# Patient Record
Sex: Female | Born: 1950 | Race: Black or African American | Hispanic: No | Marital: Married | Smoking: Former smoker
Health system: Southern US, Community
[De-identification: ages and names within clinical notes are randomized; demographics above are authoritative.]

## PROBLEM LIST (undated history)

## (undated) DIAGNOSIS — I447 Left bundle-branch block, unspecified: Secondary | ICD-10-CM

## (undated) DIAGNOSIS — I255 Ischemic cardiomyopathy: Secondary | ICD-10-CM

## (undated) DIAGNOSIS — F419 Anxiety disorder, unspecified: Secondary | ICD-10-CM

## (undated) DIAGNOSIS — I2510 Atherosclerotic heart disease of native coronary artery without angina pectoris: Secondary | ICD-10-CM

## (undated) DIAGNOSIS — E669 Obesity, unspecified: Secondary | ICD-10-CM

## (undated) DIAGNOSIS — R519 Headache, unspecified: Secondary | ICD-10-CM

## (undated) DIAGNOSIS — Z95810 Presence of automatic (implantable) cardiac defibrillator: Secondary | ICD-10-CM

## (undated) DIAGNOSIS — I4819 Other persistent atrial fibrillation: Secondary | ICD-10-CM

## (undated) DIAGNOSIS — I340 Nonrheumatic mitral (valve) insufficiency: Secondary | ICD-10-CM

## (undated) DIAGNOSIS — E7800 Pure hypercholesterolemia, unspecified: Secondary | ICD-10-CM

## (undated) DIAGNOSIS — I10 Essential (primary) hypertension: Secondary | ICD-10-CM

## (undated) DIAGNOSIS — Z9289 Personal history of other medical treatment: Secondary | ICD-10-CM

## (undated) DIAGNOSIS — R51 Headache: Secondary | ICD-10-CM

## (undated) DIAGNOSIS — I82409 Acute embolism and thrombosis of unspecified deep veins of unspecified lower extremity: Secondary | ICD-10-CM

## (undated) DIAGNOSIS — I639 Cerebral infarction, unspecified: Secondary | ICD-10-CM

## (undated) DIAGNOSIS — E119 Type 2 diabetes mellitus without complications: Secondary | ICD-10-CM

## (undated) DIAGNOSIS — R569 Unspecified convulsions: Secondary | ICD-10-CM

## (undated) DIAGNOSIS — I219 Acute myocardial infarction, unspecified: Secondary | ICD-10-CM

## (undated) DIAGNOSIS — I2699 Other pulmonary embolism without acute cor pulmonale: Secondary | ICD-10-CM

## (undated) HISTORY — DX: Anxiety disorder, unspecified: F41.9

## (undated) HISTORY — DX: Ischemic cardiomyopathy: I25.5

## (undated) HISTORY — PX: HERNIA REPAIR: SHX51

## (undated) HISTORY — DX: Atherosclerotic heart disease of native coronary artery without angina pectoris: I25.10

## (undated) HISTORY — PX: TUBAL LIGATION: SHX77

## (undated) HISTORY — DX: Nonrheumatic mitral (valve) insufficiency: I34.0

## (undated) HISTORY — DX: Cerebral infarction, unspecified: I63.9

## (undated) HISTORY — DX: Left bundle-branch block, unspecified: I44.7

## (undated) HISTORY — DX: Other persistent atrial fibrillation: I48.19

## (undated) HISTORY — PX: BILATERAL CARPAL TUNNEL RELEASE: SHX6508

## (undated) HISTORY — PX: CORONARY ANGIOPLASTY WITH STENT PLACEMENT: SHX49

## (undated) HISTORY — PX: DILATION AND CURETTAGE OF UTERUS: SHX78

## (undated) HISTORY — PX: CORONARY ARTERY BYPASS GRAFT: SHX141

## (undated) HISTORY — DX: Obesity, unspecified: E66.9

---

## 1988-01-05 HISTORY — PX: EXTERNAL EAR SURGERY: SHX627

## 1991-01-05 DIAGNOSIS — I2699 Other pulmonary embolism without acute cor pulmonale: Secondary | ICD-10-CM

## 1991-01-05 HISTORY — PX: VENA CAVA FILTER PLACEMENT: SUR1032

## 1991-01-05 HISTORY — DX: Other pulmonary embolism without acute cor pulmonale: I26.99

## 1994-01-04 DIAGNOSIS — I82409 Acute embolism and thrombosis of unspecified deep veins of unspecified lower extremity: Secondary | ICD-10-CM

## 1994-01-04 HISTORY — DX: Acute embolism and thrombosis of unspecified deep veins of unspecified lower extremity: I82.409

## 1999-01-05 DIAGNOSIS — I639 Cerebral infarction, unspecified: Secondary | ICD-10-CM

## 1999-01-05 HISTORY — DX: Cerebral infarction, unspecified: I63.9

## 2007-07-24 HISTORY — PX: CARDIAC DEFIBRILLATOR PLACEMENT: SHX171

## 2007-10-09 ENCOUNTER — Encounter: Payer: Self-pay | Admitting: Internal Medicine

## 2009-07-07 ENCOUNTER — Emergency Department (HOSPITAL_COMMUNITY)
Admission: EM | Admit: 2009-07-07 | Discharge: 2009-07-07 | Payer: Self-pay | Source: Home / Self Care | Admitting: Emergency Medicine

## 2009-07-07 ENCOUNTER — Ambulatory Visit: Payer: Self-pay | Admitting: Surgery

## 2009-07-07 ENCOUNTER — Encounter (INDEPENDENT_AMBULATORY_CARE_PROVIDER_SITE_OTHER): Payer: Self-pay | Admitting: Emergency Medicine

## 2009-09-18 ENCOUNTER — Encounter: Payer: Self-pay | Admitting: Internal Medicine

## 2009-09-30 ENCOUNTER — Encounter: Admission: RE | Admit: 2009-09-30 | Discharge: 2009-09-30 | Payer: Self-pay | Admitting: Internal Medicine

## 2009-10-17 ENCOUNTER — Emergency Department (HOSPITAL_COMMUNITY): Admission: EM | Admit: 2009-10-17 | Discharge: 2009-10-17 | Payer: Self-pay | Admitting: Emergency Medicine

## 2010-01-29 ENCOUNTER — Telehealth (INDEPENDENT_AMBULATORY_CARE_PROVIDER_SITE_OTHER): Payer: Self-pay | Admitting: *Deleted

## 2010-01-29 ENCOUNTER — Encounter: Payer: Self-pay | Admitting: Internal Medicine

## 2010-01-29 ENCOUNTER — Ambulatory Visit
Admission: RE | Admit: 2010-01-29 | Discharge: 2010-01-29 | Payer: Self-pay | Source: Home / Self Care | Attending: Internal Medicine | Admitting: Internal Medicine

## 2010-01-29 DIAGNOSIS — I4891 Unspecified atrial fibrillation: Secondary | ICD-10-CM | POA: Insufficient documentation

## 2010-01-29 DIAGNOSIS — I5022 Chronic systolic (congestive) heart failure: Secondary | ICD-10-CM | POA: Insufficient documentation

## 2010-01-29 DIAGNOSIS — I4729 Other ventricular tachycardia: Secondary | ICD-10-CM | POA: Insufficient documentation

## 2010-01-29 DIAGNOSIS — I110 Hypertensive heart disease with heart failure: Secondary | ICD-10-CM | POA: Insufficient documentation

## 2010-01-29 DIAGNOSIS — I472 Ventricular tachycardia: Secondary | ICD-10-CM | POA: Insufficient documentation

## 2010-01-29 DIAGNOSIS — I6350 Cerebral infarction due to unspecified occlusion or stenosis of unspecified cerebral artery: Secondary | ICD-10-CM | POA: Insufficient documentation

## 2010-01-29 DIAGNOSIS — I2699 Other pulmonary embolism without acute cor pulmonale: Secondary | ICD-10-CM | POA: Insufficient documentation

## 2010-01-29 DIAGNOSIS — Z95810 Presence of automatic (implantable) cardiac defibrillator: Secondary | ICD-10-CM | POA: Insufficient documentation

## 2010-02-05 NOTE — Progress Notes (Addendum)
  Pt signed ROI,faed to [facility] @ [phone]...call back @[phone] [facility]  January 29, 2010 2:56 PM     Appended Document:  Records Received from [facility] gave to [place]

## 2010-02-11 NOTE — Assessment & Plan Note (Signed)
Summary: nep/ consult due to pacemaker-mb   Visit Type:  np Primary Provider:  Carin Primrose  CC:  dizziness.  History of Present Illness: Shelley Douglas is seen at the request of Dr. Rikki Spearing to establish cardiovascular care.  She is a 60 year old woman with a complex cardiac history dates back to the year 2000 when she had her first heart attack in 2001 she had a stroke and has had called with heart failure since then. She underwent some kind of open heart surgery which sounds like a bypass; review of the chest x-ray demonstrates neither are aortic clips or a mechanical valve.  This was undertaken for shortness of breath which is continued to be a problem although it was improved by surgery. She has 6 pillow orthopnea occasional peripheral edema dyspnea on exertion but no PND. She has some exertional chest discomfort.  Records are not available from her [facility]. Records from [facility] where she presented in October demonstrates a AV paced rhythm.  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  From embolic risk factors are notable for gender-1, vascular disease-1,-diabetes1, hypertension-1, and prior stroke-2 4 CHADS VASC score of 7. She is on Coumadin.  Current Medications (verified): 1)  Digoxin 0.125 Mg Tabs (Digoxin) .... Take A Half  Tablet By Mouth Daily 2)  Coumadin 10 Mg Tabs (Warfarin Sodium) .... Use As Directed By [facility] 3)  Furosemide 20 Mg Tabs (Furosemide) .... Take One Tablet By Mouth Daily. 4)  Folic Acid 1 Mg Tabs (Folic Acid) .... Once Daily 5)  Cozaar 50 Mg Tabs (Losartan Potassium) .... Once Daily 6)  Lovastatin 20 Mg Tabs (Lovastatin) .... Take One Tablet By Mouth Daily At Bedtime  Allergies (verified): 1)  ! Plavix  Past History:  Family History: Last updated: 2010/02/05 Father: Deceased ? Mother: Living 56yrs Siblings: 5-sisters  living, 2-Deceasede, 2-brothers Deceased  Social History: Last updated: 02/05/10 Tobacco Use - No.  Alcohol Use - no Drug Use - no Disabled  Married   Risk Factors: Smoking Status: never (Feb 05, 2010)  Past Medical History: Atrial Fibrillation ? Coronary artery disease Diabetes Hyperthyroidism implantable defibrillator-history of discharge probably inappropriate secondary to rapid atrial fibrillation Pulmonary Embolism Stroke Congestive Heart Failure Dizziness SOB Deep Vein Thrombosis Anxiety  Past Surgical History: Cardiac Stent open heart surgery  specics to be obtained implantable defibrillator antiembolic filter into vena cava  Family History: Father: Deceased ? Mother: Living 52yrs Siblings: 5-sisters living, 2-Deceasede, 2-brothers Deceased  Social History: Tobacco Use - No.  Alcohol Use - no Drug Use - no Disabled  Married   Review of Systems       full review of systems was negative apart from a history of present illness and past medical history arthritis of her hands and feet, dizziness, glasses and blurry vision, poor dentition.   Vital Signs:  Patient profile:   60 year old female Height:      66.5 inches Weight:      229 pounds BMI:     36.54 Pulse rate:   77 / minute BP sitting:   138 / 76  (left arm) Cuff size:   large  Vitals Entered By: Caralee Ates CMA (02/05/2010 9:42 AM)  Physical Exam  General:  Well developed, well nourished, middle tolder aged [place] American female appearing her stated age in no acute distress. Head:  normal HEENT  apart from poor dentition Neck:  supple without thyromegaly or JVP centimeters carotids brisk and full without bruits Chest Wall:  withoutkyphosis Lungs:  clear to auscultation Heart:  regular rate and rhythm without murmurs or gallops Abdomen:  soft and nontender with active bowel sounds hepatomegaly and  Pulsation not palpable Msk:  without obvious musculoskeletal defects Pulses:   intact distal pulses Extremities:  without clubbing or cyanosis; 1+ peripheral edema Neurologic:  alert and oriented and grossly normal motor and sensory function Skin:  warm and dry Cervical Nodes:  without adenopathy Psych:  engaging affect   EKG  Procedure date:  01/29/2010  Findings:      sinus rhythm at 77 Intervals 0.16/0.10/0.36 Axis is 53 T Wave flattening rather diffusely   ICD Specifications Following MD:  Sherryl Manges, MD     ICD Vendor:  Medtronic     ICD Model Number:  D224DRG     ICD Serial Number:  [id number] ICD DOI:  [id number]     ICD Implanting MD:  NOT IMPLANTED BY [place]  Lead 1:    Location: RA     DOI: 07/24/2007     Model #: [id number]     Serial #: [id number]     Status: active Lead 2:    Location: RV     DOI: 07/24/2007     Model #: [id number]     Serial #: [id number]     Status: active  Indications::  VT   ICD Follow Up Remote Check?  No Battery Voltage:  3.11 V     Charge Time:  8.9 seconds     Underlying rhythm:  sr ICD Dependent:  No       ICD Device Measurements Atrium:  Amplitude: 4.3 mV, Impedance: 551 ohms, Threshold: 0.5 V at 0.4 msec Right Ventricle:  Amplitude: 6.3 mV, Impedance: 494 ohms, Threshold: 0.75 V at 0.4 msec Shock Impedance: 42/50 ohms   Episodes MS Episodes:  0     Percent Mode Switch:  0     Coumadin:  Yes Shock:  0     ATP:  0     Nonsustained:  4     Atrial Pacing:  4.4%     Ventricular Pacing:  0.1%  Brady Parameters Mode ddd+     Lower Rate Limit:  70     Upper Rate Limit 130 PAV 200     Sensed AV Delay:  200  Tachy Zones VF:  222     Next Cardiology Appt Due:  04/05/2010 Tech Comments:  No parameter changes.  Device function normal. Optivol and thoracic impedance abnormal in December but normal now.  ROV 3 months with Dr. Graciela Husbands at which time we will set her up for [facility]. Altha Harm, LPN  January 29, 2010 10:37 AM   Impression & Recommendations:  Problem # 1:  CONGESTIVE HEART FAILURE UNSPECIFIED (ICD-428.0)  the patient  has evidence of fluid overload with peripheral edema and JVD. We will plan to increase her Lasix from 20-40 daily we'll need to check a metabolic profile which we will do in 3 weeks' time I note that when she had her laboratories drawn at [facility] in October her creatinine was normal as was her potassium Her updated medication list for this problem includes:    Digoxin 0.125 Mg Tabs (Digoxin) .Marland Kitchen... Take a half  tablet by mouth daily    Coumadin 10 Mg Tabs (Warfarin sodium) ..... Use as directed by the coumadin clinic  Furosemide 40 Mg Tabs (Furosemide) ..... Once daily    Cozaar 50 Mg Tabs (Losartan potassium) ..... Once daily    Metoprolol Succinate 50 Mg Xr24h-tab (Metoprolol succinate) ..... Once daily  Orders: Echocardiogram (Echo)  Problem # 2:  IMPLANTATION OF DEFIBRILLATOR, HX OF (ICD-V45.02) Device parameters and data were reviewed and no changes were made-history of discharge likely inappropriate secondary to rapid atrial fibrillation which is identified for her cardiac Compass. Optivol is flat  Problem # 3:  ISCHEMIC CARDIOMYOPATHY S/P CABG (ICD-414.8)  she thinks that she had bypass surgery. Will need to get the records from [facility]. [place] also undertaken echo to assess left ventricular function as I presume it is down given the presence of her defibrillator notably she is not on a beta blocker we will start her on metoprolol succinate 50 mg a day Her updated medication list for this problem includes:    Digoxin 0.125 Mg Tabs (Digoxin) .Marland Kitchen... Take a half  tablet by mouth daily    Coumadin 10 Mg Tabs (Warfarin sodium) ..... Use as directed by the coumadin clinic    Furosemide 40 Mg Tabs (Furosemide) ..... Once daily    Cozaar 50 Mg Tabs (Losartan potassium) ..... Once daily    Metoprolol Succinate 50 Mg Xr24h-tab (Metoprolol succinate) ..... Once daily  Orders: Echocardiogram (Echo)  Problem # 4:  HYPERTENSION, HEART CONTROLLED W/ CHF (ICD-402.11) blood  pressure is modestly elevated. see above Her updated medication list for this problem includes:    Furosemide 40 Mg Tabs (Furosemide) ..... Once daily    Cozaar 50 Mg Tabs (Losartan potassium) ..... Once daily    Metoprolol Succinate 50 Mg Xr24h-tab (Metoprolol succinate) ..... Once daily  Problem # 5:  CVA (ICD-434.91) BC continued on Coumadin. Probably target INR to be 2.5-3.5. Her updated medication list for this problem includes:    Coumadin 10 Mg Tabs (Warfarin sodium) ..... Use as directed by the coumadin clinic  Problem # 6:  ATRIAL FIBRILLATION (ICD-427.31)  the patient has had no identified ventricular tachycardia; she has had however atrial fibrillation with rates up to 200. I should note that her ICD is programmed as a one zone device at 222 Her updated medication list for this problem includes:    Digoxin 0.125 Mg Tabs (Digoxin) .Marland Kitchen... Take a half  tablet by mouth daily    Coumadin 10 Mg Tabs (Warfarin sodium) ..... Use as directed by the coumadin clinic    Metoprolol Succinate 50 Mg Xr24h-tab (Metoprolol succinate) ..... Once daily  Her updated medication list for this problem includes:    Digoxin 0.125 Mg Tabs (Digoxin) .Marland Kitchen... Take a half  tablet by mouth daily    Coumadin 10 Mg Tabs (Warfarin sodium) ..... Use as directed by the coumadin clinic  Orders: Echocardiogram (Echo)  Her updated medication list for this problem includes:    Digoxin 0.125 Mg Tabs (Digoxin) .Marland Kitchen... Take a half  tablet by mouth daily    Coumadin 10 Mg Tabs (Warfarin sodium) ..... Use as directed by the coumadin clinic  Patient Instructions: 1)  Your physician recommends that you schedule a follow-up appointment in: 3 months 2)  Your physician recommends that you have lab work in 3 weeks.: BMP-428.0,414.8,427.1,427.31 3)  Your physician has requested that you have an echocardiogram in 3 week.  Echocardiography is a painless test that uses sound waves to create images of your heart. It provides your  doctor with information about the size and shape of your heart and how  well your heart's chambers and valves are working.  This procedure takes approximately one hour. There are no restrictions for this procedure. 4)  Your physician has recommended you make the following change in your medication: add Metoprolol and increase Furosemide.  Prescriptions: METOPROLOL SUCCINATE 50 MG XR24H-TAB (METOPROLOL SUCCINATE) once daily  #30 x 11   Authorized by:   Nathen May, MD, [facility]   Signed by:   <UNSIGNED>   Method used:   Electronically to        [facility] [address] (retail)       [phone]       [place], [place]  [address]       Ph: [phone]       Fax: [phone]   RxID:   [id number] FUROSEMIDE 40 MG TABS (FUROSEMIDE) once daily  #30 x 11   Authorized by:   Nathen May, MD, [facility]   Signed by:   <UNSIGNED>   Method used:   Electronically to        [facility] [address] (retail)       [phone]       [place], [place]  [address]       Ph: [phone]       Fax: [phone]   RxID:   [id number]

## 2010-02-11 NOTE — Cardiovascular Report (Signed)
Summary: Office Visit   Office Visit   Imported By: Roderic Ovens 02/04/2010 14:05:34  _____________________________________________________________________  External Attachment:    Type:   Image     Comment:   External Document

## 2010-02-16 ENCOUNTER — Encounter: Payer: Self-pay | Admitting: Internal Medicine

## 2010-02-16 ENCOUNTER — Other Ambulatory Visit (INDEPENDENT_AMBULATORY_CARE_PROVIDER_SITE_OTHER): Payer: Medicare Other

## 2010-02-16 ENCOUNTER — Other Ambulatory Visit: Payer: Self-pay | Admitting: Internal Medicine

## 2010-02-16 ENCOUNTER — Ambulatory Visit (HOSPITAL_COMMUNITY): Payer: Medicare Other | Attending: Internal Medicine

## 2010-02-16 DIAGNOSIS — I509 Heart failure, unspecified: Secondary | ICD-10-CM

## 2010-02-16 DIAGNOSIS — I10 Essential (primary) hypertension: Secondary | ICD-10-CM | POA: Insufficient documentation

## 2010-02-16 DIAGNOSIS — I059 Rheumatic mitral valve disease, unspecified: Secondary | ICD-10-CM | POA: Insufficient documentation

## 2010-02-16 DIAGNOSIS — I079 Rheumatic tricuspid valve disease, unspecified: Secondary | ICD-10-CM | POA: Insufficient documentation

## 2010-02-16 DIAGNOSIS — R42 Dizziness and giddiness: Secondary | ICD-10-CM | POA: Insufficient documentation

## 2010-02-16 DIAGNOSIS — I2589 Other forms of chronic ischemic heart disease: Secondary | ICD-10-CM

## 2010-02-16 DIAGNOSIS — I379 Nonrheumatic pulmonary valve disorder, unspecified: Secondary | ICD-10-CM | POA: Insufficient documentation

## 2010-02-16 LAB — BASIC METABOLIC PANEL
BUN: 15 mg/dL (ref 6–23)
CO2: 27 mEq/L (ref 19–32)
Calcium: 9.5 mg/dL (ref 8.4–10.5)
GFR: 84.34 mL/min (ref >60.00)
Glucose, Bld: 98 mg/dL (ref 70–99)
Sodium: 141 mEq/L (ref 135–145)

## 2010-02-25 ENCOUNTER — Encounter: Payer: Self-pay | Admitting: Internal Medicine

## 2010-03-03 ENCOUNTER — Encounter: Payer: Self-pay | Admitting: Internal Medicine

## 2010-03-03 ENCOUNTER — Other Ambulatory Visit (INDEPENDENT_AMBULATORY_CARE_PROVIDER_SITE_OTHER): Payer: Medicare Other

## 2010-03-03 ENCOUNTER — Other Ambulatory Visit: Payer: Self-pay | Admitting: Internal Medicine

## 2010-03-03 DIAGNOSIS — Z79899 Other long term (current) drug therapy: Secondary | ICD-10-CM

## 2010-03-03 DIAGNOSIS — I509 Heart failure, unspecified: Secondary | ICD-10-CM

## 2010-03-03 LAB — BASIC METABOLIC PANEL
BUN: 15 mg/dL (ref 6–23)
CO2: 25 mEq/L (ref 19–32)
Calcium: 9.5 mg/dL (ref 8.4–10.5)
Chloride: 106 mEq/L (ref 96–112)
Creatinine, Ser: 1 mg/dL (ref 0.4–1.2)

## 2010-03-03 NOTE — Assessment & Plan Note (Signed)
Summary: [facility]   Allergies: 1)  ! Plavix  Past History:  Past Medical History: Atrial Fibrillation Coronary artery disease/mitral valve regurgitation Diabetes Hyperthyroidism implantable defibrillator for spontaneous ventricular tachycardia-history of discharge probably inappropriate secondary to rapid atrial fibrillation hypercoagulable state manifested byPulmonary Embolism and Deep Vein Thrombosis Stroke 2001 Congestive Heart Failure Dizziness Anxiety obesity  Past Surgical History: Cardiac Stent status post CABG with mitral valve ring implantable defibrillator antiembolic filter into vena cava    ICD Specifications Following MD:  Sherryl Manges, MD     ICD Vendor:  Medtronic     ICD Model Number:  D224DRG     ICD Serial Number:  [id number] ICD DOI:  [id number]     ICD Implanting MD:  NOT IMPLANTED BY [place]  Lead 1:    Location: RA     DOI: 07/24/2007     Model #: [id number]     Serial #: [id number]     Status: active Lead 2:    Location: RV     DOI: 07/24/2007     Model #: [id number]     Serial #: [id number]     Status: active  Indications::  VT   ICD Follow Up ICD Dependent:  No      Episodes Coumadin:  Yes  Brady Parameters Mode ddd+     Lower Rate Limit:  70     Upper Rate Limit 130 PAV 200     Sensed AV Delay:  200  Tachy Zones VF:  222

## 2010-03-19 LAB — CBC
MCH: 29.4 pg (ref 26.0–34.0)
MCHC: 34.1 g/dL (ref 30.0–36.0)
Platelets: 207 10*3/uL (ref 150–400)
RDW: 15.6 % — ABNORMAL HIGH (ref 11.5–15.5)

## 2010-03-19 LAB — BASIC METABOLIC PANEL
CO2: 24 mEq/L (ref 19–32)
Calcium: 10 mg/dL (ref 8.4–10.5)
Creatinine, Ser: 0.87 mg/dL (ref 0.4–1.2)
GFR calc Af Amer: >60 mL/min (ref >60)
GFR calc non Af Amer: >60 mL/min (ref >60)
Glucose, Bld: 103 mg/dL — ABNORMAL HIGH (ref 70–99)
Sodium: 137 mEq/L (ref 135–145)

## 2010-03-19 LAB — URINALYSIS, ROUTINE W REFLEX MICROSCOPIC
Bilirubin Urine: NEGATIVE
Glucose, UA: NEGATIVE mg/dL
Hgb urine dipstick: NEGATIVE
Ketones, ur: NEGATIVE mg/dL
Protein, ur: NEGATIVE mg/dL

## 2010-03-19 LAB — DIFFERENTIAL
Basophils Absolute: 0.1 10*3/uL (ref 0.0–0.1)
Eosinophils Absolute: 0.2 10*3/uL (ref 0.0–0.7)
Lymphocytes Relative: 32 % (ref 12–46)
Monocytes Absolute: 0.4 10*3/uL (ref 0.1–1.0)
Neutrophils Relative %: 50 % (ref 43–77)

## 2010-03-19 LAB — PROTIME-INR
INR: 1.11 (ref 0.00–1.49)
Prothrombin Time: 14.5 seconds (ref 11.6–15.2)

## 2010-03-22 LAB — CBC
MCH: 30.8 pg (ref 26.0–34.0)
MCHC: 35 g/dL (ref 30.0–36.0)
Platelets: 240 10*3/uL (ref 150–400)
RBC: 3.86 MIL/uL — ABNORMAL LOW (ref 3.87–5.11)

## 2010-03-22 LAB — COMPREHENSIVE METABOLIC PANEL
AST: 18 U/L (ref 0–37)
Albumin: 3.5 g/dL (ref 3.5–5.2)
Calcium: 10.2 mg/dL (ref 8.4–10.5)
Chloride: 108 mEq/L (ref 96–112)
Creatinine, Ser: 0.94 mg/dL (ref 0.4–1.2)
GFR calc Af Amer: >60 mL/min (ref >60)

## 2010-03-22 LAB — DIFFERENTIAL
Eosinophils Relative: 4 % (ref 0–5)
Lymphocytes Relative: 32 % (ref 12–46)
Lymphs Abs: 1 10*3/uL (ref 0.7–4.0)
Monocytes Absolute: 0.4 10*3/uL (ref 0.1–1.0)
Monocytes Relative: 13 % — ABNORMAL HIGH (ref 3–12)

## 2010-03-22 LAB — URINALYSIS, ROUTINE W REFLEX MICROSCOPIC
Glucose, UA: NEGATIVE mg/dL
Hgb urine dipstick: NEGATIVE
Ketones, ur: NEGATIVE mg/dL
pH: 6.5 (ref 5.0–8.0)

## 2010-03-22 LAB — URINE CULTURE: Colony Count: 30000

## 2010-03-24 NOTE — Letter (Signed)
Summary: [facility] D/C Summaries, Labs, Admission Notes,  [facility] D/C Summaries, Labs, Admission Notes, Chest Xray, Consultations 2009   Imported By: Roderic Ovens 03/19/2010 09:42:43  _____________________________________________________________________  External Attachment:    Type:   Image     Comment:   External Document

## 2010-05-29 ENCOUNTER — Encounter: Payer: Self-pay | Admitting: Internal Medicine

## 2010-06-08 ENCOUNTER — Encounter: Payer: Medicare Other | Admitting: Internal Medicine

## 2010-08-07 ENCOUNTER — Encounter: Payer: Self-pay | Admitting: Internal Medicine

## 2010-08-07 ENCOUNTER — Ambulatory Visit (INDEPENDENT_AMBULATORY_CARE_PROVIDER_SITE_OTHER): Payer: Medicare Other | Admitting: Internal Medicine

## 2010-08-07 DIAGNOSIS — I255 Ischemic cardiomyopathy: Secondary | ICD-10-CM

## 2010-08-07 DIAGNOSIS — I472 Ventricular tachycardia: Secondary | ICD-10-CM

## 2010-08-07 DIAGNOSIS — I509 Heart failure, unspecified: Secondary | ICD-10-CM

## 2010-08-07 DIAGNOSIS — Z95810 Presence of automatic (implantable) cardiac defibrillator: Secondary | ICD-10-CM

## 2010-08-07 DIAGNOSIS — I5022 Chronic systolic (congestive) heart failure: Secondary | ICD-10-CM

## 2010-08-07 DIAGNOSIS — I4891 Unspecified atrial fibrillation: Secondary | ICD-10-CM

## 2010-08-07 DIAGNOSIS — I2589 Other forms of chronic ischemic heart disease: Secondary | ICD-10-CM

## 2010-08-07 DIAGNOSIS — I4729 Other ventricular tachycardia: Secondary | ICD-10-CM

## 2010-08-07 LAB — ICD DEVICE OBSERVATION
AL AMPLITUDE: 5.1 mv
ATRIAL PACING ICD: 12.88 pct
BAMS-0001: 170 {beats}/min
DEV-0020ICD: NEGATIVE
FVT: 0
RV LEAD IMPEDENCE ICD: 494 Ohm
RV LEAD THRESHOLD: 0.625 V
TZAT-0001ATACH: 1
TZAT-0001ATACH: 2
TZAT-0004SLOWVT: 8
TZAT-0005SLOWVT: 88 pct
TZAT-0011SLOWVT: 10 ms
TZAT-0012ATACH: 150 ms
TZAT-0012ATACH: 150 ms
TZAT-0012FASTVT: 200 ms
TZAT-0012SLOWVT: 200 ms
TZAT-0018ATACH: NEGATIVE
TZAT-0020ATACH: 1.5 ms
TZAT-0020ATACH: 1.5 ms
TZAT-0020ATACH: 1.5 ms
TZAT-0020FASTVT: 1.5 ms
TZON-0003ATACH: 350 ms
TZON-0003SLOWVT: 350 ms
TZON-0003VSLOWVT: 430 ms
TZST-0001ATACH: 4
TZST-0001FASTVT: 2
TZST-0001FASTVT: 6
TZST-0001SLOWVT: 3
TZST-0001SLOWVT: 5
TZST-0001SLOWVT: 6
TZST-0002FASTVT: NEGATIVE
TZST-0002FASTVT: NEGATIVE
TZST-0002FASTVT: NEGATIVE
TZST-0003SLOWVT: 20 J
TZST-0003SLOWVT: 35 J
VENTRICULAR PACING ICD: 0.05 pct
VF: 0

## 2010-08-07 NOTE — Patient Instructions (Addendum)
Your physician has recommended you make the following change in your medication:  1) take lasix (furosemide) 40mg  one tablet two times a day every other day for 14 days, then resume lasix 40mg  one tablet daily.  Remote monitoring is used to monitor your Pacemaker of ICD from home. This monitoring reduces the number of office visits required to check your device to one time per year. It allows [place] to keep an eye on the functioning of your device to ensure it is working properly. You are scheduled for a device check from home on 11/05/10. You may send your transmission at any time that day. If you have a wireless device, the transmission will be sent automatically. After your physician reviews your transmission, you will receive a postcard with your next transmission date.  Your physician wants you to follow-up in: 1 year with Dr. Graciela Husbands. You will receive a reminder letter in the mail two months in advance. If you don't receive a letter, please call our office to schedule the follow-up appointment.

## 2010-08-07 NOTE — Assessment & Plan Note (Signed)
The patient's device was interrogated.  The information was reviewed. No changes were made in the programming.    

## 2010-08-07 NOTE — Assessment & Plan Note (Signed)
contine  Current meds

## 2010-08-07 NOTE — Assessment & Plan Note (Signed)
There is a trend in her optivol to a crossing   We will increase her diuretic to bid evderyother day for two weeks

## 2010-08-07 NOTE — Assessment & Plan Note (Signed)
Nos sustained VT but no therpaies

## 2010-08-07 NOTE — Assessment & Plan Note (Signed)
Holding sinus  

## 2010-08-07 NOTE — Progress Notes (Signed)
  HPI  Shelley Douglas is a 60 y.o. female Seen in followup for ischemic cardiomyopathy with prior CABG for which she underwent ICD implantation.  She also has a history of atrial fibrillationw ith multple TERF and is on anticoagulation  She notes swelling if she misss her diuretics  The patient denies chest pain, shortness of breath, nocturnal dyspnea, orthopnea or peripheral edema.  There have been no palpitations, lightheadedness or syncope.    Past Medical History  Diagnosis Date  . Atrial fibrillation   . CAD (coronary artery disease)   . Mitral valve regurgitation   . DM (diabetes mellitus)   . Hyperthyroidism   . Stroke 2001  . CHF (congestive heart failure)   . Dizziness   . Anxiety   . Obesity     Past Surgical History  Procedure Date  . Coronary angioplasty with stent placement   . Coronary artery bypass graft   . Cardiac defibrillator placement     Medtronic  . Vena cava filter placement     Current Outpatient Prescriptions  Medication Sig Dispense Refill  . digoxin (LANOXIN) 0.125 MG tablet Take 62.5 mcg by mouth daily.        . folic acid (FOLVITE) 1 MG tablet Take 1 mg by mouth daily.        . furosemide (LASIX) 40 MG tablet Take 40 mg by mouth daily.        . insulin aspart (NOVOLOG) 100 UNIT/ML injection Inject 8 Units into the skin 3 (three) times daily before meals.        Marland Kitchen losartan (COZAAR) 50 MG tablet Take 50 mg by mouth daily.        Marland Kitchen lovastatin (MEVACOR) 20 MG tablet Take 20 mg by mouth at bedtime.        . metoprolol (TOPROL-XL) 50 MG 24 hr tablet Take 50 mg by mouth daily.        Marland Kitchen warfarin (COUMADIN) 10 MG tablet Take 10 mg by mouth as directed.          Allergies  Allergen Reactions  . Clopidogrel Bisulfate     Review of Systems negative except from HPI and PMH  Physical Exam Well developed and well nourished in no acute distress HENT normal E scleral and icterus clear Neck Supple JVP flat; carotids brisk and full Clear to  ausculation Regular rate and rhythm, no murmurs gallops or rub Soft with active bowel sounds No clubbing cyanosis and edema Alert and oriented, grossly normal motor and sensory function Skin Warm and Dry  ECG  Assessment and  Plan

## 2010-11-05 ENCOUNTER — Encounter: Payer: Medicare Other | Admitting: *Deleted

## 2010-11-10 ENCOUNTER — Encounter: Payer: Self-pay | Admitting: *Deleted

## 2010-11-17 ENCOUNTER — Encounter: Payer: Medicare Other | Admitting: *Deleted

## 2010-11-25 ENCOUNTER — Encounter: Payer: Self-pay | Admitting: *Deleted

## 2010-12-02 ENCOUNTER — Telehealth: Payer: Self-pay | Admitting: Internal Medicine

## 2010-12-02 NOTE — Telephone Encounter (Signed)
FU Call: Pt returning call to our office. Please return pt call to discuss further.

## 2010-12-02 NOTE — Telephone Encounter (Signed)
Pt having difficulty transmitting device information. She says "I only have 1 jack" Gunnar Fusi is aware and will call pt. Mylo Red RN

## 2011-02-18 ENCOUNTER — Ambulatory Visit
Admission: RE | Admit: 2011-02-18 | Discharge: 2011-02-18 | Disposition: A | Discharge status: Home / Self Care | Payer: Medicare Other | Source: Ambulatory Visit | Attending: Cardiovascular Disease | Admitting: Cardiovascular Disease

## 2011-02-18 ENCOUNTER — Other Ambulatory Visit: Payer: Self-pay | Admitting: Cardiovascular Disease

## 2011-02-18 DIAGNOSIS — R062 Wheezing: Secondary | ICD-10-CM

## 2011-03-15 ENCOUNTER — Encounter: Payer: Self-pay | Admitting: *Deleted

## 2011-03-22 ENCOUNTER — Ambulatory Visit
Admission: RE | Admit: 2011-03-22 | Discharge: 2011-03-22 | Disposition: A | Discharge status: Home / Self Care | Payer: Medicare Other | Source: Ambulatory Visit | Attending: Cardiovascular Disease | Admitting: Cardiovascular Disease

## 2011-03-22 ENCOUNTER — Other Ambulatory Visit: Payer: Self-pay | Admitting: Cardiovascular Disease

## 2011-03-22 DIAGNOSIS — R52 Pain, unspecified: Secondary | ICD-10-CM

## 2011-05-12 ENCOUNTER — Inpatient Hospital Stay (HOSPITAL_COMMUNITY): Payer: Medicare Other

## 2011-05-12 ENCOUNTER — Encounter (HOSPITAL_COMMUNITY): Payer: Self-pay | Admitting: *Deleted

## 2011-05-12 ENCOUNTER — Inpatient Hospital Stay (HOSPITAL_COMMUNITY)
Admission: AD | Admit: 2011-05-12 | Discharge: 2011-05-14 | DRG: 300 | Disposition: A | Discharge status: Home / Self Care | Payer: Medicare Other | Source: Ambulatory Visit | Attending: Cardiovascular Disease | Admitting: Cardiovascular Disease

## 2011-05-12 DIAGNOSIS — I4891 Unspecified atrial fibrillation: Secondary | ICD-10-CM | POA: Diagnosis present

## 2011-05-12 DIAGNOSIS — I110 Hypertensive heart disease with heart failure: Secondary | ICD-10-CM | POA: Diagnosis present

## 2011-05-12 DIAGNOSIS — I472 Ventricular tachycardia, unspecified: Secondary | ICD-10-CM | POA: Diagnosis present

## 2011-05-12 DIAGNOSIS — I255 Ischemic cardiomyopathy: Secondary | ICD-10-CM

## 2011-05-12 DIAGNOSIS — M79609 Pain in unspecified limb: Secondary | ICD-10-CM

## 2011-05-12 DIAGNOSIS — I4729 Other ventricular tachycardia: Secondary | ICD-10-CM | POA: Diagnosis present

## 2011-05-12 DIAGNOSIS — I824Z9 Acute embolism and thrombosis of unspecified deep veins of unspecified distal lower extremity: Principal | ICD-10-CM | POA: Diagnosis present

## 2011-05-12 DIAGNOSIS — R0602 Shortness of breath: Secondary | ICD-10-CM

## 2011-05-12 DIAGNOSIS — I10 Essential (primary) hypertension: Secondary | ICD-10-CM | POA: Diagnosis present

## 2011-05-12 DIAGNOSIS — I2699 Other pulmonary embolism without acute cor pulmonale: Secondary | ICD-10-CM | POA: Diagnosis present

## 2011-05-12 DIAGNOSIS — Z95810 Presence of automatic (implantable) cardiac defibrillator: Secondary | ICD-10-CM | POA: Diagnosis present

## 2011-05-12 LAB — CBC
MCH: 27.9 pg (ref 26.0–34.0)
MCHC: 32.5 g/dL (ref 30.0–36.0)
Platelets: 224 10*3/uL (ref 150–400)
RDW: 14.9 % (ref 11.5–15.5)

## 2011-05-12 LAB — DIFFERENTIAL
Basophils Absolute: 0 10*3/uL (ref 0.0–0.1)
Basophils Relative: 1 % (ref 0–1)
Eosinophils Absolute: 0.1 10*3/uL (ref 0.0–0.7)
Monocytes Relative: 8 % (ref 3–12)
Neutro Abs: 2 10*3/uL (ref 1.7–7.7)
Neutrophils Relative %: 55 % (ref 43–77)

## 2011-05-12 LAB — COMPREHENSIVE METABOLIC PANEL
ALT: 12 U/L (ref 0–35)
AST: 16 U/L (ref 0–37)
Alkaline Phosphatase: 92 U/L (ref 39–117)
CO2: 27 mEq/L (ref 19–32)
Calcium: 10.4 mg/dL (ref 8.4–10.5)
Chloride: 103 mEq/L (ref 96–112)
GFR calc Af Amer: >90 mL/min (ref >90)
GFR calc non Af Amer: 89 mL/min — ABNORMAL LOW (ref >90)
Glucose, Bld: 99 mg/dL (ref 70–99)
Sodium: 138 mEq/L (ref 135–145)
Total Bilirubin: 0.2 mg/dL — ABNORMAL LOW (ref 0.3–1.2)

## 2011-05-12 LAB — GLUCOSE, CAPILLARY: Glucose-Capillary: 115 mg/dL — ABNORMAL HIGH (ref 70–99)

## 2011-05-12 LAB — APTT: aPTT: 33 seconds (ref 24–37)

## 2011-05-12 LAB — HEMOGLOBIN A1C: Mean Plasma Glucose: 148 mg/dL — ABNORMAL HIGH (ref <117)

## 2011-05-12 LAB — PROTIME-INR
INR: 1.32 (ref 0.00–1.49)
Prothrombin Time: 16.6 seconds — ABNORMAL HIGH (ref 11.6–15.2)

## 2011-05-12 MED ORDER — IOHEXOL 350 MG/ML SOLN
80.0000 mL | Freq: Once | INTRAVENOUS | Status: AC | PRN
  Administered 2011-05-12: 80 mL via INTRAVENOUS

## 2011-05-12 MED ORDER — ALUM & MAG HYDROXIDE-SIMETH 200-200-20 MG/5ML PO SUSP: 30.0000 mL | Freq: Four times a day (QID) | ORAL | Status: DC | PRN

## 2011-05-12 MED ORDER — SODIUM CHLORIDE 0.9 % IJ SOLN: 3.0000 mL | INTRAMUSCULAR | Status: DC | PRN

## 2011-05-12 MED ORDER — HYDROCODONE-ACETAMINOPHEN 5-325 MG PO TABS: 1.0000 | ORAL_TABLET | ORAL | Status: DC | PRN

## 2011-05-12 MED ORDER — WARFARIN SODIUM 10 MG PO TABS
20.0000 mg | ORAL_TABLET | Freq: Every day | ORAL | Status: DC
  Administered 2011-05-12 – 2011-05-13 (×2): 20 mg via ORAL
  Filled 2011-05-12 (×3): qty 2

## 2011-05-12 MED ORDER — DOCUSATE SODIUM 100 MG PO CAPS
100.0000 mg | ORAL_CAPSULE | Freq: Two times a day (BID) | ORAL | Status: DC
  Administered 2011-05-13: 100 mg via ORAL
  Filled 2011-05-12 (×6): qty 1

## 2011-05-12 MED ORDER — DIGOXIN 125 MCG PO TABS
125.0000 ug | ORAL_TABLET | Freq: Every day | ORAL | Status: DC
  Administered 2011-05-13 – 2011-05-14 (×2): 125 ug via ORAL
  Filled 2011-05-12 (×3): qty 1

## 2011-05-12 MED ORDER — FOLIC ACID 1 MG PO TABS
1.0000 mg | ORAL_TABLET | Freq: Every day | ORAL | Status: DC
  Administered 2011-05-12 – 2011-05-14 (×3): 1 mg via ORAL
  Filled 2011-05-12 (×3): qty 1

## 2011-05-12 MED ORDER — SODIUM CHLORIDE 0.9 % IJ SOLN
3.0000 mL | Freq: Two times a day (BID) | INTRAMUSCULAR | Status: DC
  Administered 2011-05-13: 3 mL via INTRAVENOUS

## 2011-05-12 MED ORDER — ONDANSETRON HCL 4 MG PO TABS: 4.0000 mg | ORAL_TABLET | Freq: Four times a day (QID) | ORAL | Status: DC | PRN

## 2011-05-12 MED ORDER — FUROSEMIDE 40 MG PO TABS
40.0000 mg | ORAL_TABLET | Freq: Every day | ORAL | Status: DC
  Administered 2011-05-12 – 2011-05-14 (×3): 40 mg via ORAL
  Filled 2011-05-12 (×3): qty 1

## 2011-05-12 MED ORDER — SIMVASTATIN 10 MG PO TABS
10.0000 mg | ORAL_TABLET | Freq: Every day | ORAL | Status: DC
  Administered 2011-05-12 – 2011-05-13 (×2): 10 mg via ORAL
  Filled 2011-05-12 (×3): qty 1

## 2011-05-12 MED ORDER — SODIUM CHLORIDE 0.9 % IJ SOLN: 3.0000 mL | Freq: Two times a day (BID) | INTRAMUSCULAR | Status: DC

## 2011-05-12 MED ORDER — INSULIN ASPART 100 UNIT/ML ~~LOC~~ SOLN
4.0000 [IU] | Freq: Three times a day (TID) | SUBCUTANEOUS | Status: DC
  Administered 2011-05-13 – 2011-05-14 (×4): 4 [IU] via SUBCUTANEOUS

## 2011-05-12 MED ORDER — ACETAMINOPHEN 325 MG PO TABS: 650.0000 mg | ORAL_TABLET | Freq: Four times a day (QID) | ORAL | Status: DC | PRN

## 2011-05-12 MED ORDER — ONDANSETRON HCL 4 MG/2ML IJ SOLN: 4.0000 mg | Freq: Four times a day (QID) | INTRAMUSCULAR | Status: DC | PRN

## 2011-05-12 MED ORDER — HEPARIN (PORCINE) IN NACL 100-0.45 UNIT/ML-% IJ SOLN
1450.0000 [IU]/h | INTRAMUSCULAR | Status: DC
  Administered 2011-05-12: 1000 [IU]/h via INTRAVENOUS
  Administered 2011-05-13: 1450 [IU]/h via INTRAVENOUS
  Filled 2011-05-12 (×2): qty 250

## 2011-05-12 MED ORDER — SODIUM CHLORIDE 0.9 % IV SOLN: 250.0000 mL | INTRAVENOUS | Status: DC | PRN

## 2011-05-12 MED ORDER — WARFARIN - PHYSICIAN DOSING INPATIENT: Freq: Every day | Status: DC

## 2011-05-12 MED ORDER — INSULIN ASPART 100 UNIT/ML ~~LOC~~ SOLN
0.0000 [IU] | Freq: Three times a day (TID) | SUBCUTANEOUS | Status: DC
  Administered 2011-05-13 – 2011-05-14 (×2): 2 [IU] via SUBCUTANEOUS

## 2011-05-12 MED ORDER — LOSARTAN POTASSIUM 50 MG PO TABS
50.0000 mg | ORAL_TABLET | Freq: Every day | ORAL | Status: DC
  Administered 2011-05-13 – 2011-05-14 (×2): 50 mg via ORAL
  Filled 2011-05-12 (×3): qty 1

## 2011-05-12 MED ORDER — ASPIRIN EC 81 MG PO TBEC
81.0000 mg | DELAYED_RELEASE_TABLET | Freq: Every day | ORAL | Status: DC
  Administered 2011-05-12 – 2011-05-14 (×3): 81 mg via ORAL
  Filled 2011-05-12 (×3): qty 1

## 2011-05-12 MED ORDER — ACETAMINOPHEN 650 MG RE SUPP: 650.0000 mg | Freq: Four times a day (QID) | RECTAL | Status: DC | PRN

## 2011-05-12 MED ORDER — ZOLPIDEM TARTRATE 5 MG PO TABS: 5.0000 mg | ORAL_TABLET | Freq: Every evening | ORAL | Status: DC | PRN

## 2011-05-12 NOTE — H&P (Signed)
Shelley Douglas is an 61 y.o. female.   Chief Complaint: Shortness of breath HPI: 61 years old black obese female has left leg pain with exertional shortness of breath. H/O PE in past with inadequate INR with 15 mg. of coumadin daily. No chest pain or fever or cough.  Past Medical History  Diagnosis Date  . Atrial fibrillation   . CAD (coronary artery disease)   . Mitral valve regurgitation   . DM (diabetes mellitus)   . Hyperthyroidism   . Stroke 2001  . CHF (congestive heart failure)   . Dizziness   . Anxiety   . Obesity       Past Surgical History  Procedure Date  . Coronary angioplasty with stent placement   . Coronary artery bypass graft   . Cardiac defibrillator placement     Medtronic  . Vena cava filter placement     No family history on file. Social History:  reports that she quit smoking about 21 years ago. She does not have any smokeless tobacco history on file. She reports that she does not drink alcohol or use illicit drugs.  Allergies:  Allergies  Allergen Reactions  . Clopidogrel Bisulfate     Medications Prior to Admission  Medication Sig Dispense Refill  . digoxin (LANOXIN) 0.125 MG tablet Take 125 mcg by mouth daily.       . folic acid (FOLVITE) 1 MG tablet Take 1 mg by mouth daily.        . furosemide (LASIX) 40 MG tablet Take 40 mg by mouth daily.        . insulin aspart (NOVOLOG) 100 UNIT/ML injection Inject 8 Units into the skin 3 (three) times daily before meals.        Marland Kitchen losartan (COZAAR) 50 MG tablet Take 50 mg by mouth daily.        Marland Kitchen lovastatin (MEVACOR) 20 MG tablet Take 20 mg by mouth at bedtime.      Marland Kitchen warfarin (COUMADIN) 10 MG tablet Take 20 mg by mouth daily.        No results found for this or any previous visit (from the past 48 hour(s)). No results found.  @ROS @ + Lower dentures, Asthma, Chest pain, PE, DVT, Obesity, DM, II, HTN, CAD, CABG, CVA. No hepatitis, kidney stone or seizures or psych admission.  Blood pressure 151/84,  pulse 80, temperature 97.7 F (36.5 C), temperature source Oral, resp. rate 20, height 5\' 6"  (1.676 m), weight 101.9 kg (224 lb 10.4 oz), SpO2 98.00%. General appearance: alert, cooperative, appears stated age and mild distress Head: Normocephalic, without obvious abnormality, atraumatic Eyes: Brown, conjunctivae/corneas clear. PERRL, EOM's intact. Neck: no adenopathy, no carotid bruit, no JVD, supple, symmetrical, trachea midline and thyroid not enlarged. Resp: clear to auscultation bilaterally Cardio: regular rate and rhythm, S1, S2 normal, no murmur, click, rub or gallop GI: soft, distended, non-tender; bowel sounds normal. Extremities: extremities normal, atraumatic, no cyanosis or edema. Left lower leg calf tenderness and medial aspect of above ankle area tenderness Skin: Skin color, texture, turgor normal. No rashes or lesions Neurologic: Alert and oriented X 3, normal strength and tone. Normal symmetric reflexes. Normal coordination and gait Cranial nerves: grossly intact.  Assessment/Plan Exertional dyspnea Possible PE History of PE Hypertension DM, II Obesity Left leg pain  Plan: IV heparin CT angio chest Lower leg DVT evaluation Home medications  Shelley Douglas S 05/12/2011, 10:56 AM

## 2011-05-12 NOTE — Progress Notes (Addendum)
ANTICOAGULATION CONSULT NOTE - Initial Consult  Pharmacy Consult for heparin Indication: pulmonary embolus, r/o new PE  Allergies  Allergen Reactions  . Clopidogrel Bisulfate     Patient Measurements: Height: 5\' 6"  (167.6 cm) Weight: 224 lb 10.4 oz (101.9 kg) IBW/kg (Calculated) : 59.3    Vital Signs: Temp: 97.7 F (36.5 C) (05/08 1040) Temp src: Oral (05/08 1040) BP: 151/84 mmHg (05/08 1040) Pulse Rate: 80  (05/08 1040)  Labs: No results found for this basename: HGB:2,HCT:3,PLT:3,APTT:3,LABPROT:3,INR:3,HEPARINUNFRC:3,CREATININE:3,CKTOTAL:3,CKMB:3,TROPONINI:3 in the last 72 hours  Estimated Creatinine Clearance: 71.2 ml/min (by C-G formula based on Cr of 1).   Medical History: Past Medical History  Diagnosis Date  . Atrial fibrillation   . CAD (coronary artery disease)   . Mitral valve regurgitation   . DM (diabetes mellitus)   . Hyperthyroidism   . Stroke 2001  . CHF (congestive heart failure)   . Dizziness   . Anxiety   . Obesity     Medications:  Prescriptions prior to admission  Medication Sig Dispense Refill  . digoxin (LANOXIN) 0.125 MG tablet Take 125 mcg by mouth daily.       . folic acid (FOLVITE) 1 MG tablet Take 1 mg by mouth daily.        . furosemide (LASIX) 40 MG tablet Take 40 mg by mouth daily.        . insulin aspart (NOVOLOG) 100 UNIT/ML injection Inject 8 Units into the skin 3 (three) times daily before meals.        Marland Kitchen losartan (COZAAR) 50 MG tablet Take 50 mg by mouth daily.        Marland Kitchen lovastatin (MEVACOR) 20 MG tablet Take 20 mg by mouth at bedtime.      Marland Kitchen warfarin (COUMADIN) 10 MG tablet Take 20 mg by mouth daily.        Assessment: 61 yo lady to start heparin for suspected PE.  She was on coumadin 15 mg daily,changed to 20 mg daily today. Goal of Therapy:  Heparin level 0.3-0.7 units/ml    Plan:  Will start heparin at 1000 units/hr. Check PT/INR now and evaluate need for heparin bolus. Check heparin level 8 hours after start and  daily while on heparin Check CBC daily while on heparin.  Craig Wisnewski Poteet 05/12/2011,11:00 AM  Addum:  INR 1.32.  Will increase heparin to 1600 units/hr.

## 2011-05-12 NOTE — Progress Notes (Signed)
Pt had 9 beats of nonsustained vtach. Pt is asymptomatic, lying in bed laughing with her husband and grandson. Pt denies any fluttering in her chest. VSS. Dr. Algie Coffer made aware. No new orders. Will continue to monitor. Ramond Craver, RN

## 2011-05-12 NOTE — Progress Notes (Signed)
Preliminary report of bilateral calf DVT from LE dopplers were called to Dr. Algie Coffer. Pt is already on heparin per previous orders. Will continue to monitor patient closely. Ramond Craver, RN

## 2011-05-12 NOTE — Progress Notes (Signed)
UR Completed Coben Godshall Graves-Bigelow, RN,BSN [phone]  

## 2011-05-12 NOTE — Progress Notes (Addendum)
ANTICOAGULATION CONSULT NOTE - Follow Up Consult  Pharmacy Consult for heparin Indication: pulmonary embolus, r/o new PE  Allergies  Allergen Reactions  . Clopidogrel Bisulfate     Patient Measurements: Height: 5\' 6"  (167.6 cm) Weight: 224 lb 10.4 oz (101.9 kg) IBW/kg (Calculated) : 59.3  Heparin Dosing Weight: 83 kg  Vital Signs: Temp: 98 F (36.7 C) (05/08 1845) Temp src: Oral (05/08 1845) BP: 132/57 mmHg (05/08 1845) Pulse Rate: 73  (05/08 1845)  Labs:  Basename 05/12/11 2016 05/12/11 1226  HGB -- 12.5  HCT -- 38.5  PLT -- 224  APTT -- 33  LABPROT -- 16.6*  INR -- 1.32  HEPARINUNFRC 0.94* --  CREATININE -- 0.77  CKTOTAL -- --  CKMB -- --  TROPONINI -- --    Estimated Creatinine Clearance: 89 ml/min (by C-G formula based on Cr of 0.77).   Medications:  Infusions:    . heparin 1,600 Units/hr (05/12/11 1339)    Assessment: 61 yo F supratherapeutic on heparin for suspected PE.     Goal of Therapy:  Heparin level 0.3-0.7 units/ml Monitor platelets by anticoagulation protocol: Yes   Plan:  -Decrease heparin to 1450 units/hr - f/u 8h level after rate change - continue daily HL and cbc  Deyanna Mctier L. Illene Bolus, PharmD, BCPS Clinical Pharmacist Pager: [phone] 05/12/2011 9:17 PM

## 2011-05-12 NOTE — Progress Notes (Signed)
  Echocardiogram 2D Echocardiogram has been performed.  Shelley Douglas, Real Cons 05/12/2011, 3:13 PM

## 2011-05-12 NOTE — Progress Notes (Signed)
*  Preliminary Results* Bilateral lower extremity venous duplex completed. Bilateral calves are positive for deep vein thrombosis.  05/12/2011 4:07 PM  Elpidio Galea, RDMS,RDCS

## 2011-05-12 NOTE — Care Management Note (Signed)
  Page 1 of 1   05/12/2011     4:15:28 PM   CARE MANAGEMENT NOTE 05/12/2011  Patient:  Shelley Douglas, Shelley Douglas   Account Number:  [id number]  Date Initiated:  05/12/2011  Documentation initiated by:  GRAVES-BIGELOW,Soua Lenk  Subjective/Objective Assessment:   Pt admitted with sob. + for B DVT.     Action/Plan:   Anticipated DC Date:  05/17/2011   Anticipated DC Plan:  HOME W HOME HEALTH SERVICES      DC Planning Services  CM consult      Choice offered to / List presented to:             Status of service:  In process, will continue to follow Medicare Important Message given?   (If response is "NO", the following Medicare IM given date fields will be blank) Date Medicare IM given:   Date Additional Medicare IM given:    Discharge Disposition:    Per UR Regulation:    If discussed at Long Length of Stay Meetings, dates discussed:    Comments:  05-12-11 1614 Tomi Bamberger, RN,BSN [phone] CM will continue to monitor for disposition needs.

## 2011-05-13 LAB — GLUCOSE, CAPILLARY
Glucose-Capillary: 114 mg/dL — ABNORMAL HIGH (ref 70–99)
Glucose-Capillary: 122 mg/dL — ABNORMAL HIGH (ref 70–99)

## 2011-05-13 LAB — BASIC METABOLIC PANEL
BUN: 14 mg/dL (ref 6–23)
Calcium: 9.9 mg/dL (ref 8.4–10.5)
Creatinine, Ser: 0.75 mg/dL (ref 0.50–1.10)
GFR calc Af Amer: >90 mL/min (ref >90)
GFR calc non Af Amer: 90 mL/min — ABNORMAL LOW (ref >90)
Glucose, Bld: 112 mg/dL — ABNORMAL HIGH (ref 70–99)

## 2011-05-13 LAB — CBC
HCT: 36.8 % (ref 36.0–46.0)
Hemoglobin: 11.9 g/dL — ABNORMAL LOW (ref 12.0–15.0)
MCH: 27.9 pg (ref 26.0–34.0)
MCHC: 32.3 g/dL (ref 30.0–36.0)
MCV: 86.4 fL (ref 78.0–100.0)

## 2011-05-13 MED ORDER — HEPARIN (PORCINE) IN NACL 100-0.45 UNIT/ML-% IJ SOLN
1100.0000 [IU]/h | INTRAMUSCULAR | Status: DC
  Administered 2011-05-13: 1050 [IU]/h via INTRAVENOUS
  Filled 2011-05-13: qty 250

## 2011-05-13 NOTE — Progress Notes (Signed)
ANTICOAGULATION CONSULT NOTE - Follow Up Consult  Pharmacy Consult for heparin Indication: pulmonary embolus, r/o new PE  Allergies  Allergen Reactions  . Clopidogrel Bisulfate     Patient Measurements: Height: 5\' 6"  (167.6 cm) Weight: 225 lb 6.4 oz (102.241 kg) IBW/kg (Calculated) : 59.3  Heparin Dosing Weight: 83 kg  Vital Signs: Temp: 97.6 F (36.4 C) (05/09 1400) Temp src: Oral (05/09 1400) BP: 114/58 mmHg (05/09 1400) Pulse Rate: 78  (05/09 1400)  Labs:  Basename 05/13/11 1500 05/13/11 0605 05/12/11 2016 05/12/11 1226  HGB -- 11.9* -- 12.5  HCT -- 36.8 -- 38.5  PLT -- 231 -- 224  APTT -- -- -- 33  LABPROT -- 19.4* -- 16.6*  INR -- 1.61* -- 1.32  HEPARINUNFRC 0.72* 0.95* 0.94* --  CREATININE -- 0.75 -- 0.77  CKTOTAL -- -- -- --  CKMB -- -- -- --  TROPONINI -- -- -- --    Estimated Creatinine Clearance: 89.2 ml/min (by C-G formula based on Cr of 0.75).  Assessment: 61 yo F with h/o PE for Heparin.  Heparin level still >goal.   Goal of Therapy:  Heparin level 0.3-0.7 units/ml Monitor platelets by anticoagulation protocol: Yes   Plan:  Decrease heparin to 1050 units/hr. Check heparin level in 6 hours.  Talbert Cage, PharmD -05/13/2011 4:00 PM

## 2011-05-13 NOTE — Progress Notes (Signed)
ANTICOAGULATION CONSULT NOTE - Follow Up Consult  Pharmacy Consult for heparin Indication: pulmonary embolus  Labs:  Basename 05/13/11 2230 05/13/11 1500 05/13/11 0605 05/12/11 1226  HGB -- -- 11.9* 12.5  HCT -- -- 36.8 38.5  PLT -- -- 231 224  APTT -- -- -- 33  LABPROT -- -- 19.4* 16.6*  INR -- -- 1.61* 1.32  HEPARINUNFRC 0.37 0.72* 0.95* --  CREATININE -- -- 0.75 0.77  CKTOTAL -- -- -- --  CKMB -- -- -- --  TROPONINI -- -- -- --    Assessment/Plan: 61yo female now therapeutic on heparin for PE.  Will continue at current rate and confirm stable with am labs.   Colleen Can PharmD BCPS 05/13/2011,11:45 PM

## 2011-05-13 NOTE — Progress Notes (Signed)
Subjective:  Feeling 50 % better. Decreasing shortness of breath.  Objective:  Vital Signs in the last 24 hours: Temp:  [97.6 F (36.4 C)-98 F (36.7 C)] 97.6 F (36.4 C) (05/09 1400) Pulse Rate:  [70-78] 78  (05/09 1400) Cardiac Rhythm:  [-] Atrial paced (05/09 0840) Resp:  [18-20] 20  (05/09 1400) BP: (114-132)/(57-69) 114/58 mmHg (05/09 1400) SpO2:  [93 %-96 %] 95 % (05/09 1400) Weight:  [102.241 kg (225 lb 6.4 oz)] 102.241 kg (225 lb 6.4 oz) (05/09 0500)  Physical Exam: BP Readings from Last 1 Encounters:  05/13/11 114/58    Wt Readings from Last 1 Encounters:  05/13/11 102.241 kg (225 lb 6.4 oz)    Weight change:   HEENT: [place]/AT, Eyes-Brown, PERL, EOMI, Conjunctiva-Pink, Sclera-Non-icteric Neck: No JVD, No bruit, Trachea midline. Lungs:  Clear, Bilateral. Cardiac:  Regular rhythm, normal S1 and S2, no S3.  Abdomen:  Soft, non-tender. Extremities:  No edema present. No cyanosis. No clubbing. Bilateral calf tenderness. CNS: AxOx3, Cranial nerves grossly intact, moves all 4 extremities. Right handed. Skin: Warm and dry.   Intake/Output from previous day: 05/08 0701 - 05/09 0700 In: 840 [P.O.:840] Out: 2100 [Urine:2100]    Lab Results: BMET    Component Value Date/Time   NA 139 05/13/2011 0605   K 4.0 05/13/2011 0605   CL 104 05/13/2011 0605   CO2 24 05/13/2011 0605   GLUCOSE 112* 05/13/2011 0605   BUN 14 05/13/2011 0605   CREATININE 0.75 05/13/2011 0605   CALCIUM 9.9 05/13/2011 0605   GFRNONAA 90* 05/13/2011 0605   GFRAA >90 05/13/2011 0605   CBC    Component Value Date/Time   WBC 3.0* 05/13/2011 0605   RBC 4.26 05/13/2011 0605   HGB 11.9* 05/13/2011 0605   HCT 36.8 05/13/2011 0605   PLT 231 05/13/2011 0605   MCV 86.4 05/13/2011 0605   MCH 27.9 05/13/2011 0605   MCHC 32.3 05/13/2011 0605   RDW 15.1 05/13/2011 0605   LYMPHSABS 1.3 05/12/2011 1226   MONOABS 0.3 05/12/2011 1226   EOSABS 0.1 05/12/2011 1226   BASOSABS 0.0 05/12/2011 1226   CARDIAC ENZYMES No results found for this basename:  CKTOTAL, CKMB, CKMBINDEX, TROPONINI   INR-1.6  Assessment/Plan:  Patient Active Hospital Problem List: PULMONARY EMBOLISM (01/29/2010)   Assessment: less likely   Plan: continue IV heparin Atrial fibrillation (01/29/2010)   Assessment: Stable   Plan: continue coumadin IMPLANTATION OF DEFIBRILLATOR, HX OF (01/29/2010) HYPERTENSION  (01/29/2010)   Assessment: Stable Bilateral lower ext. DVT  Continue heparin and coumadin    LOS: 1 day    Orpah Cobb  MD  05/13/2011, 2:52 PM

## 2011-05-13 NOTE — Progress Notes (Signed)
ANTICOAGULATION CONSULT NOTE - Follow Up Consult  Pharmacy Consult for heparin Indication: pulmonary embolus, r/o new PE  Allergies  Allergen Reactions  . Clopidogrel Bisulfate     Patient Measurements: Height: 5\' 6"  (167.6 cm) Weight: 225 lb 6.4 oz (102.241 kg) IBW/kg (Calculated) : 59.3  Heparin Dosing Weight: 83 kg  Vital Signs: Temp: 98 F (36.7 C) (05/09 0500) Temp src: Oral (05/09 0500) BP: 122/63 mmHg (05/09 0500) Pulse Rate: 70  (05/09 0500)  Labs:  Basename 05/13/11 0605 05/12/11 2016 05/12/11 1226  HGB 11.9* -- 12.5  HCT 36.8 -- 38.5  PLT 231 -- 224  APTT -- -- 33  LABPROT 19.4* -- 16.6*  INR 1.61* -- 1.32  HEPARINUNFRC 0.95* 0.94* --  CREATININE -- -- 0.77  CKTOTAL -- -- --  CKMB -- -- --  TROPONINI -- -- --    Estimated Creatinine Clearance: 89.2 ml/min (by C-G formula based on Cr of 0.77).  Assessment: 61 yo F with h/o PE for Heparin     Goal of Therapy:  Heparin level 0.3-0.7 units/ml Monitor platelets by anticoagulation protocol: Yes   Plan:  Hold Heparin x 45 minutes, then decrease Heparin 1200 units/hr Check heparin level in 8 hours.  Geannie Risen, PharmD, BCPS   -05/13/2011 7:49 AM

## 2011-05-14 LAB — GLUCOSE, CAPILLARY
Glucose-Capillary: 125 mg/dL — ABNORMAL HIGH (ref 70–99)
Glucose-Capillary: 118 mg/dL — ABNORMAL HIGH (ref 70–99)

## 2011-05-14 LAB — CBC
Hemoglobin: 13 g/dL (ref 12.0–15.0)
Platelets: 236 10*3/uL (ref 150–400)
RBC: 4.57 MIL/uL (ref 3.87–5.11)

## 2011-05-14 LAB — PROTIME-INR
INR: 1.83 — ABNORMAL HIGH (ref 0.00–1.49)
Prothrombin Time: 21.5 seconds — ABNORMAL HIGH (ref 11.6–15.2)

## 2011-05-14 LAB — HEPARIN LEVEL (UNFRACTIONATED): Heparin Unfractionated: 0.38 IU/mL (ref 0.30–0.70)

## 2011-05-14 NOTE — Progress Notes (Signed)
ANTICOAGULATION CONSULT NOTE - Follow Up Consult  Pharmacy Consult for Heparin Indication: pulmonary embolus  Allergies  Allergen Reactions  . Clopidogrel Bisulfate     Patient Measurements: Height: 5\' 6"  (167.6 cm) Weight: 225 lb 3.2 oz (102.15 kg) IBW/kg (Calculated) : 59.3  Heparin Dosing Weight: 83 kg  Vital Signs: Temp: 97.7 F (36.5 C) (05/10 0500) Temp src: Oral (05/10 0500) BP: 124/72 mmHg (05/10 0500) Pulse Rate: 69  (05/10 0500)  Labs:  Basename 05/14/11 0540 05/13/11 2230 05/13/11 1500 05/13/11 0605 05/12/11 1226  HGB 13.0 -- -- 11.9* --  HCT 39.8 -- -- 36.8 38.5  PLT 236 -- -- 231 224  APTT -- -- -- -- 33  LABPROT 21.5* -- -- 19.4* 16.6*  INR 1.83* -- -- 1.61* 1.32  HEPARINUNFRC 0.38 0.37 0.72* -- --  CREATININE -- -- -- 0.75 0.77  CKTOTAL -- -- -- -- --  CKMB -- -- -- -- --  TROPONINI -- -- -- -- --    Estimated Creatinine Clearance: 89.2 ml/min (by C-G formula based on Cr of 0.75).  Medications:  Prescriptions prior to admission  Medication Sig Dispense Refill  . digoxin (LANOXIN) 0.125 MG tablet Take 125 mcg by mouth daily.       . folic acid (FOLVITE) 1 MG tablet Take 1 mg by mouth daily.        . furosemide (LASIX) 40 MG tablet Take 40 mg by mouth daily.        . insulin aspart (NOVOLOG) 100 UNIT/ML injection Inject 8 Units into the skin 3 (three) times daily before meals.        Marland Kitchen losartan (COZAAR) 50 MG tablet Take 50 mg by mouth daily.        Marland Kitchen lovastatin (MEVACOR) 20 MG tablet Take 20 mg by mouth at bedtime.      Marland Kitchen warfarin (COUMADIN) 10 MG tablet Take 20 mg by mouth daily.       Admit Complaint: 61 y.o.  female  admitted 05/12/2011 with shortness of breath and leg pain.  Pharmacy consulted to dose heparin while MD doses warfarin.  Home dose warfarin: 20 mg daily (recent increase from 15 mg daily)  Assessment: Anticoagulation: history of PE, CT this admission negative for new PE: INR rising on 20 mg daily, Heparin level at low end of goal  on 1050/hr. No bleeding noted, CBC stable.  Cardiovascular: atrial fibrillation, hypertension, CHF (ECHO pending), CAD,CABG, ICD,  mitral regurg, : ASA, dig, furo 40 PO daily, losartan, simvastatin, BP and HR at goal,   Endocrinology: diabetes, hyperthyroid: SSI, glucose <150, TSH wnl, HA1C 6.8  Gastrointestinal / Nutrition: obesity, folic acid Neurology: history of CVA, anxiety PTA Medication Issues: All home meds ordered Best Practices: DVT Prophylaxis:  Heparin/warfarin  Goal of Therapy:  Heparin level 0.3-0.7 units/ml Monitor platelets by anticoagulation protocol: Yes   Plan:  1. Increase heparin to 1100 units/hr 2. Continue daily heparin level and CBC 3. Daily INR   Zuleyka Kloc Merrily Brittle 05/14/2011,9:50 AM

## 2011-05-14 NOTE — Discharge Summary (Signed)
Physician Discharge Summary  Patient ID: Shelley Douglas MRN: [id number] DOB/AGE: [birth year] 61 y.o.  Admit date: 05/12/2011 Discharge date: 05/14/2011  Admission Diagnoses: Pulmonary Embolism R/O DVT Paroxysmal atrial fibrillation Hypertension S/P ICD  Discharge Diagnoses:  Principal Problem:  *Deep Venous thrombosis of both lower legs Active Problems:  Paroxysmal Atrial fibrillation  IMPLANTATION OF DEFIBRILLATOR, HX OF  HYPERTENSION  Non-sustained VT  Discharged Condition: good  Hospital Course: 61 years old black female with known history of pulmonary embolism presented with exertional dyspnea. Her CT angiogram of chest was not positive for gross pulmonary embolism but bilateral lower extremity doppler was positive for DVT. She responded very well to IV heparin and coumadin therapy. On 05/14/2011 she was discharged home in stable condition with follow up by me in 4 days.  Consults: cardiology  Significant Diagnostic Studies: labs: near normal CBC and Electrolytes. INR-1.6 to 1.8and radiology: CT scan: chest negative for pulmonary embolism.  Treatments: anticoagulation: heparin  Discharge Exam: Blood pressure 124/72, pulse 69, temperature 97.7 F (36.5 C), temperature source Oral, resp. rate 20, height 5\' 6"  (1.676 m), weight 102.15 kg (225 lb 3.2 oz), SpO2 97.00%. HEENT: [place]/AT, Eyes-Brown, PERL, EOMI, Conjunctiva-Pink, Sclera-Non-icteric  Neck: No JVD, No bruit, Trachea midline.  Lungs: Clear, Bilateral.  Cardiac: Regular rhythm, normal S1 and S2, no S3.  Abdomen: Soft, non-tender.  Extremities: No edema present. No cyanosis. No clubbing. Bilateral calf tenderness.  CNS: AxOx3, Cranial nerves grossly intact, moves all 4 extremities. Right handed.  Skin: Warm and dry   Disposition: Home with self care.   Medication List  As of 05/14/2011  9:35 AM   TAKE these medications         digoxin 0.125 MG tablet   Commonly known as: LANOXIN   Take 125 mcg by mouth daily.      folic acid 1 MG tablet   Commonly known as: FOLVITE   Take 1 mg by mouth daily.      furosemide 40 MG tablet   Commonly known as: LASIX   Take 40 mg by mouth daily.      insulin aspart 100 UNIT/ML injection   Commonly known as: novoLOG   Inject 8 Units into the skin 3 (three) times daily before meals.      losartan 50 MG tablet   Commonly known as: COZAAR   Take 50 mg by mouth daily.      lovastatin 20 MG tablet   Commonly known as: MEVACOR   Take 20 mg by mouth at bedtime.      warfarin 10 MG tablet   Commonly known as: COUMADIN   Take 20 mg by mouth daily.           Follow-up Information    Follow up with [facility], MD. Schedule an appointment as soon as possible for a visit in 4 days.   Contact information:   [address] [place] [address] [phone]          Signed: Ricki Rodriguez 05/14/2011, 9:35 AM

## 2011-05-14 NOTE — Progress Notes (Signed)
Pt in stable condition, d/c'd via wheelchair to private vehicle. Assessment unchanged from this am

## 2011-05-20 ENCOUNTER — Other Ambulatory Visit: Payer: Self-pay | Admitting: Internal Medicine

## 2011-06-22 ENCOUNTER — Telehealth: Payer: Self-pay | Admitting: Internal Medicine

## 2011-06-22 NOTE — Telephone Encounter (Signed)
FYI:  Patient called to say she received our letter but she has chosen a different cardiologist and will not be returning.

## 2011-06-22 NOTE — Telephone Encounter (Signed)
Will make dr klein aware 

## 2011-06-24 NOTE — Telephone Encounter (Signed)
Will forward to MD.  

## 2011-11-20 ENCOUNTER — Emergency Department (HOSPITAL_COMMUNITY)
Admission: EM | Admit: 2011-11-20 | Discharge: 2011-11-20 | Disposition: A | Discharge status: Home / Self Care | Payer: Medicare Other | Attending: Emergency Medicine | Admitting: Emergency Medicine

## 2011-11-20 ENCOUNTER — Emergency Department (HOSPITAL_COMMUNITY): Payer: Medicare Other

## 2011-11-20 ENCOUNTER — Encounter (HOSPITAL_COMMUNITY): Payer: Self-pay | Admitting: *Deleted

## 2011-11-20 DIAGNOSIS — E119 Type 2 diabetes mellitus without complications: Secondary | ICD-10-CM | POA: Insufficient documentation

## 2011-11-20 DIAGNOSIS — Z79899 Other long term (current) drug therapy: Secondary | ICD-10-CM | POA: Insufficient documentation

## 2011-11-20 DIAGNOSIS — Z7901 Long term (current) use of anticoagulants: Secondary | ICD-10-CM | POA: Insufficient documentation

## 2011-11-20 DIAGNOSIS — Z8659 Personal history of other mental and behavioral disorders: Secondary | ICD-10-CM | POA: Insufficient documentation

## 2011-11-20 DIAGNOSIS — M25562 Pain in left knee: Secondary | ICD-10-CM

## 2011-11-20 DIAGNOSIS — Y929 Unspecified place or not applicable: Secondary | ICD-10-CM | POA: Insufficient documentation

## 2011-11-20 DIAGNOSIS — Z9861 Coronary angioplasty status: Secondary | ICD-10-CM | POA: Insufficient documentation

## 2011-11-20 DIAGNOSIS — S93409A Sprain of unspecified ligament of unspecified ankle, initial encounter: Secondary | ICD-10-CM | POA: Insufficient documentation

## 2011-11-20 DIAGNOSIS — Z8673 Personal history of transient ischemic attack (TIA), and cerebral infarction without residual deficits: Secondary | ICD-10-CM | POA: Insufficient documentation

## 2011-11-20 DIAGNOSIS — I4891 Unspecified atrial fibrillation: Secondary | ICD-10-CM | POA: Insufficient documentation

## 2011-11-20 DIAGNOSIS — E669 Obesity, unspecified: Secondary | ICD-10-CM | POA: Insufficient documentation

## 2011-11-20 DIAGNOSIS — Z951 Presence of aortocoronary bypass graft: Secondary | ICD-10-CM | POA: Insufficient documentation

## 2011-11-20 DIAGNOSIS — I2510 Atherosclerotic heart disease of native coronary artery without angina pectoris: Secondary | ICD-10-CM | POA: Insufficient documentation

## 2011-11-20 DIAGNOSIS — Z794 Long term (current) use of insulin: Secondary | ICD-10-CM | POA: Insufficient documentation

## 2011-11-20 DIAGNOSIS — I059 Rheumatic mitral valve disease, unspecified: Secondary | ICD-10-CM | POA: Insufficient documentation

## 2011-11-20 DIAGNOSIS — W010XXA Fall on same level from slipping, tripping and stumbling without subsequent striking against object, initial encounter: Secondary | ICD-10-CM | POA: Insufficient documentation

## 2011-11-20 DIAGNOSIS — Y9301 Activity, walking, marching and hiking: Secondary | ICD-10-CM | POA: Insufficient documentation

## 2011-11-20 DIAGNOSIS — S93402A Sprain of unspecified ligament of left ankle, initial encounter: Secondary | ICD-10-CM

## 2011-11-20 DIAGNOSIS — Z87891 Personal history of nicotine dependence: Secondary | ICD-10-CM | POA: Insufficient documentation

## 2011-11-20 DIAGNOSIS — I509 Heart failure, unspecified: Secondary | ICD-10-CM | POA: Insufficient documentation

## 2011-11-20 MED ORDER — OXYCODONE-ACETAMINOPHEN 5-325 MG PO TABS
2.0000 | ORAL_TABLET | Freq: Once | ORAL | Status: AC
  Administered 2011-11-20: 2 via ORAL
  Filled 2011-11-20: qty 2

## 2011-11-20 MED ORDER — OXYCODONE-ACETAMINOPHEN 5-325 MG PO TABS: 1.0000 | ORAL_TABLET | ORAL | Status: DC | PRN

## 2011-11-20 MED ORDER — IBUPROFEN 400 MG PO TABS
600.0000 mg | ORAL_TABLET | Freq: Once | ORAL | Status: AC
  Administered 2011-11-20: 600 mg via ORAL
  Filled 2011-11-20: qty 1

## 2011-11-20 NOTE — ED Provider Notes (Signed)
History  This chart was scribed for Shelley Razor, MD by Ladona Ridgel Day, ED scribe. This patient was seen in room TR05C/TR05C and the patient's care was started at 1408.   CSN: [id number]  Arrival date & time 11/20/11  1408   First MD Initiated Contact with Patient 11/20/11 1642      Chief Complaint  Patient presents with  . Fall   Patient is a 61 y.o. female presenting with fall. The history is provided by the patient. No language interpreter was used.  Fall The accident occurred 1 to 2 hours ago. The fall occurred while walking. She landed on a hard floor. There was no blood loss. The point of impact was the left knee. The pain is present in the left knee (left lateral ankle). The pain is moderate. Pertinent negatives include no fever, no nausea, no vomiting and no headaches.   Shelley Douglas is a 61 y.o. female who presents to the Emergency Department complaining of a fall this PM after she slipped and fell twisting/injuriing her left ankle. She states when injury occurred she heard a popping noise and immediately felt pain and now with increased swelling/tenderness to lateral aspect of her left ankle. She states associated left knee pain. She denies hitting her head or LOC. She denies any other injuries.  Past Medical History  Diagnosis Date  . Atrial fibrillation   . CAD (coronary artery disease)   . Mitral valve regurgitation   . DM (diabetes mellitus)   . Hyperthyroidism   . Stroke 2001  . CHF (congestive heart failure)   . Dizziness   . Anxiety   . Obesity     Past Surgical History  Procedure Date  . Coronary angioplasty with stent placement   . Coronary artery bypass graft   . Cardiac defibrillator placement     [employer]  . Vena cava filter placement     No family history on file.  History  Substance Use Topics  . Smoking status: Former Smoker    Quit date: 01/04/1990  . Smokeless tobacco: Not on file  . Alcohol Use: No    OB History    Grav Para Term  Preterm Abortions TAB SAB Ect Mult Living                  Review of Systems  Constitutional: Negative for fever and chills.  Respiratory: Negative for shortness of breath.   Gastrointestinal: Negative for nausea and vomiting.  Musculoskeletal: Negative for back pain.  Neurological: Negative for weakness and headaches.  All other systems reviewed and are negative.    Allergies  Clopidogrel bisulfate  Home Medications   Current Outpatient Rx  Name  Route  Sig  Dispense  Refill  . DIGOXIN 0.125 MG PO TABS   Oral   Take 125 mcg by mouth daily.          Shelley Douglas FOLIC ACID 1 MG PO TABS   Oral   Take 1 mg by mouth daily.          . FUROSEMIDE 40 MG PO TABS   Oral   Take 40 mg by mouth daily.         . INSULIN ASPART 100 UNIT/ML [place] SOLN   Subcutaneous   Inject 8 Units into the skin 2 (two) times daily.          Shelley Douglas LOSARTAN POTASSIUM 50 MG PO TABS   Oral   Take 50 mg by mouth daily.          Shelley Douglas  WARFARIN SODIUM 10 MG PO TABS   Oral   Take 15-20 mg by mouth daily. Takes 15 mg on Mon, Tues, Weds, & Thurs and 20 mg all other days of the week.           Triage Vitals: BP 143/86  Pulse 86  Temp 97.5 F (36.4 C) (Oral)  Resp 18  SpO2 97%  Physical Exam  Nursing note and vitals reviewed. Constitutional: She appears well-developed and well-nourished. No distress.  HENT:  Head: Normocephalic and atraumatic.  Eyes: Conjunctivae normal are normal. Right eye exhibits no discharge. Left eye exhibits no discharge.  Neck: Neck supple.  Cardiovascular: Normal rate, regular rhythm and normal heart sounds.  Exam reveals no gallop and no friction rub.   No murmur heard. Pulmonary/Chest: Effort normal and breath sounds normal. No respiratory distress.  Abdominal: Soft. She exhibits no distension. There is no tenderness.  Musculoskeletal: She exhibits no edema and no tenderness.       Tenderness of left lateral malleolus. NVI distally, skin intact. Left knee is grossly normal  in comparison to right, no effusion or bony tenderness. No pain with ROM of right knee.   Neurological: She is alert.  Skin: Skin is warm and dry.  Psychiatric: She has a normal mood and affect. Her behavior is normal. Thought content normal.    ED Course  Procedures (including critical care time) DIAGNOSTIC STUDIES: Oxygen Saturation is 97% on room air, normal by my interpretation.    COORDINATION OF CARE: At 500 PM Discussed treatment plan with patient which includes left knee/ankle X-ray, pain medicine, crutches. Patient agrees.   Labs Reviewed - No data to display Dg Knee 2 Views Left  11/20/2011  *RADIOLOGY REPORT*  Clinical Data: Fall, left knee pain  LEFT KNEE - 1-2 VIEW  Comparison: None.  Findings: No fracture or dislocation is seen.  Mild to moderate tricompartmental degenerative changes, most prominent in the patellofemoral compartment.  Suspected small suprapatellar knee joint effusion.  IMPRESSION: No fracture or dislocation is seen.  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  Suspected small suprapatellar knee joint effusion.   Original Report Authenticated By: Charline Bills, M.D.    Dg Ankle Complete Left  11/20/2011  *RADIOLOGY REPORT*  Clinical Data: Fall, left ankle pain  LEFT ANKLE COMPLETE - 3+ VIEW  Comparison: None.  Findings: No fracture or dislocation is seen.  The ankle mortise is intact.  The base of the fifth metatarsal is unremarkable.  Moderate medial soft tissue swelling.  IMPRESSION: No fracture or dislocation is seen.  Moderate medial soft tissue swelling.   Original Report Authenticated By: Charline Bills, M.D.      1. Left ankle sprain   2. Knee pain, left       MDM  61 year old female with left knee and ankle pain after mechanical fall. Imaging negative for acute osseous injury. Plan rest, ice, and elevation. As needed pain medication. Return precautions discussed. Outpatient followup as needed otherwise.  I personally preformed  the services scribed in my presence. The recorded information has been reviewed and is accurate. Shelley Razor, MD.          Shelley Razor, MD 11/22/11 [phone]

## 2011-11-20 NOTE — ED Notes (Signed)
Family at bedside. 

## 2011-11-20 NOTE — ED Notes (Addendum)
Patient fell today and injured her left ankle.  Pulse present.    Patient also c/o left knee pain as well.  Patient slipped on grass

## 2011-11-20 NOTE — Progress Notes (Signed)
Orthopedic Tech Progress Note Patient Details:  Shelley Douglas Sep 15, 1950 [id number]  Ortho Devices Type of Ortho Device: ASO;Crutches Ortho Device/Splint Location: left LE Ortho Device/Splint Interventions: Application   Jawad Wiacek T 11/20/2011, 5:54 PM

## 2011-12-20 ENCOUNTER — Telehealth: Payer: Self-pay | Admitting: Internal Medicine

## 2011-12-20 NOTE — Telephone Encounter (Signed)
Per pt call 06-21-11, received past due letter, however pt has found another cardiologist and having her checks done there/mt

## 2012-01-19 ENCOUNTER — Other Ambulatory Visit (HOSPITAL_COMMUNITY): Payer: Self-pay | Admitting: Cardiovascular Disease

## 2012-01-19 DIAGNOSIS — Z139 Encounter for screening, unspecified: Secondary | ICD-10-CM

## 2012-02-07 ENCOUNTER — Ambulatory Visit (INDEPENDENT_AMBULATORY_CARE_PROVIDER_SITE_OTHER): Payer: Medicare Other | Admitting: Obstetrics & Gynecology

## 2012-02-07 ENCOUNTER — Encounter: Payer: Self-pay | Admitting: Obstetrics & Gynecology

## 2012-02-07 ENCOUNTER — Other Ambulatory Visit (HOSPITAL_COMMUNITY)
Admission: RE | Admit: 2012-02-07 | Discharge: 2012-02-07 | Disposition: A | Discharge status: Home / Self Care | Payer: Medicare Other | Source: Ambulatory Visit | Attending: Obstetrics & Gynecology | Admitting: Obstetrics & Gynecology

## 2012-02-07 ENCOUNTER — Other Ambulatory Visit (HOSPITAL_COMMUNITY): Payer: Medicare Other

## 2012-02-07 ENCOUNTER — Ambulatory Visit (HOSPITAL_COMMUNITY)
Admission: RE | Admit: 2012-02-07 | Discharge: 2012-02-07 | Disposition: A | Discharge status: Home / Self Care | Payer: Medicare Other | Source: Ambulatory Visit | Attending: Cardiovascular Disease | Admitting: Cardiovascular Disease

## 2012-02-07 VITALS — BP 137/67 | HR 86 | Ht 67.0 in | Wt 219.7 lb

## 2012-02-07 DIAGNOSIS — Z1151 Encounter for screening for human papillomavirus (HPV): Secondary | ICD-10-CM | POA: Insufficient documentation

## 2012-02-07 DIAGNOSIS — Z139 Encounter for screening, unspecified: Secondary | ICD-10-CM

## 2012-02-07 DIAGNOSIS — Z780 Asymptomatic menopausal state: Secondary | ICD-10-CM | POA: Insufficient documentation

## 2012-02-07 DIAGNOSIS — Z124 Encounter for screening for malignant neoplasm of cervix: Secondary | ICD-10-CM | POA: Insufficient documentation

## 2012-02-07 DIAGNOSIS — Z13820 Encounter for screening for osteoporosis: Secondary | ICD-10-CM | POA: Insufficient documentation

## 2012-02-07 DIAGNOSIS — Z01419 Encounter for gynecological examination (general) (routine) without abnormal findings: Secondary | ICD-10-CM | POA: Insufficient documentation

## 2012-02-07 NOTE — Patient Instructions (Signed)

## 2012-02-07 NOTE — Progress Notes (Signed)
Patient ID: Shelley Douglas, female   DOB: 04/11/1950, 63 y.o.   MRN: [id number]  Chief Complaint  Patient presents with  . Gynecologic Exam    Pap smear    HPI Shelley Douglas is a 62 y.o. female.  [id number] No LMP recorded. Patient is postmenopausal. Referred by Dr. Algie Coffer for pelvic and pap smear. Last pap about 3 years ago, no abnormality.  HPI  Past Medical History  Diagnosis Date  . Atrial fibrillation   . CAD (coronary artery disease)   . Mitral valve regurgitation   . DM (diabetes mellitus)   . Hyperthyroidism   . Stroke 2001  . CHF (congestive heart failure)   . Dizziness   . Anxiety   . Obesity     Past Surgical History  Procedure Date  . Coronary angioplasty with stent placement   . Coronary artery bypass graft   . Cardiac defibrillator placement     [employer]  . Vena cava filter placement     History reviewed. No pertinent family history.  Social History History  Substance Use Topics  . Smoking status: Former Smoker    Quit date: 01/05/1988  . Smokeless tobacco: Not on file  . Alcohol Use: No    Allergies  Allergen Reactions  . Clopidogrel Bisulfate Rash    Current Outpatient Prescriptions  Medication Sig Dispense Refill  . digoxin (LANOXIN) 0.125 MG tablet Take 125 mcg by mouth daily.       . folic acid (FOLVITE) 1 MG tablet Take 1 mg by mouth daily.       . furosemide (LASIX) 40 MG tablet Take 40 mg by mouth daily.      . insulin aspart (NOVOLOG) 100 UNIT/ML injection Inject 8 Units into the skin 2 (two) times daily.       Marland Kitchen losartan (COZAAR) 50 MG tablet Take 50 mg by mouth daily.       Marland Kitchen warfarin (COUMADIN) 10 MG tablet Take 15-20 mg by mouth daily. Takes 15 mg on Mon, Tues, Weds, & Thurs and 20 mg all other days of the week.      Marland Kitchen oxyCODONE-acetaminophen (PERCOCET/ROXICET) 5-325 MG per tablet Take 1 tablet by mouth every 4 (four) hours as needed for pain.  10 tablet  0    Review of Systems Review of Systems  Gastrointestinal: Negative  for abdominal pain and abdominal distention.  Genitourinary: Negative for vaginal bleeding, vaginal discharge, difficulty urinating, vaginal pain and pelvic pain.    Blood pressure 137/67, pulse 86, height 5\' 7"  (1.702 m), weight 219 lb 11.2 oz (99.655 kg).  Physical Exam Physical Exam  Constitutional: She is oriented to person, place, and time. She appears well-developed and well-nourished. No distress.  Pulmonary/Chest: Effort normal. No respiratory distress.  Abdominal: She exhibits no distension. There is no tenderness.  Genitourinary: Vagina normal and uterus normal. No vaginal discharge found.       Pap done and no masses noted   Neurological: She is alert and oriented to person, place, and time.  Skin: Skin is warm and dry.  Psychiatric: She has a normal mood and affect. Her behavior is normal.    Data Reviewed Provider notes  Assessment    Normal postmenopausal Gyn examination, no history of abnormal pap    Plan    Pap with HPV cotesting. If normal with not need pap after age 59       Shelley Douglas 02/07/2012, 1:15 PM

## 2012-06-03 ENCOUNTER — Other Ambulatory Visit: Payer: Self-pay | Admitting: Internal Medicine

## 2012-07-02 ENCOUNTER — Other Ambulatory Visit: Payer: Self-pay | Admitting: Internal Medicine

## 2012-07-03 ENCOUNTER — Other Ambulatory Visit: Payer: Self-pay | Admitting: Internal Medicine

## 2012-07-06 ENCOUNTER — Other Ambulatory Visit: Payer: Self-pay | Admitting: *Deleted

## 2012-07-06 MED ORDER — FUROSEMIDE 40 MG PO TABS: 40.0000 mg | ORAL_TABLET | Freq: Every day | ORAL | Status: DC

## 2012-10-05 ENCOUNTER — Other Ambulatory Visit: Payer: Self-pay | Admitting: Internal Medicine

## 2012-10-05 DIAGNOSIS — Z1231 Encounter for screening mammogram for malignant neoplasm of breast: Secondary | ICD-10-CM

## 2012-10-26 ENCOUNTER — Ambulatory Visit
Admission: RE | Admit: 2012-10-26 | Discharge: 2012-10-26 | Disposition: A | Discharge status: Home / Self Care | Payer: Medicare Other | Source: Ambulatory Visit | Attending: Internal Medicine | Admitting: Internal Medicine

## 2012-10-26 DIAGNOSIS — Z1231 Encounter for screening mammogram for malignant neoplasm of breast: Secondary | ICD-10-CM

## 2013-09-09 ENCOUNTER — Encounter (HOSPITAL_COMMUNITY): Payer: Self-pay | Admitting: Emergency Medicine

## 2013-09-09 ENCOUNTER — Emergency Department (HOSPITAL_COMMUNITY)
Admission: EM | Admit: 2013-09-09 | Discharge: 2013-09-09 | Disposition: A | Discharge status: Home / Self Care | Payer: Medicare Other | Attending: Emergency Medicine | Admitting: Emergency Medicine

## 2013-09-09 DIAGNOSIS — Z87891 Personal history of nicotine dependence: Secondary | ICD-10-CM | POA: Diagnosis not present

## 2013-09-09 DIAGNOSIS — Z8673 Personal history of transient ischemic attack (TIA), and cerebral infarction without residual deficits: Secondary | ICD-10-CM | POA: Insufficient documentation

## 2013-09-09 DIAGNOSIS — M545 Low back pain, unspecified: Secondary | ICD-10-CM | POA: Diagnosis not present

## 2013-09-09 DIAGNOSIS — E669 Obesity, unspecified: Secondary | ICD-10-CM | POA: Insufficient documentation

## 2013-09-09 DIAGNOSIS — Z7901 Long term (current) use of anticoagulants: Secondary | ICD-10-CM | POA: Insufficient documentation

## 2013-09-09 DIAGNOSIS — Z951 Presence of aortocoronary bypass graft: Secondary | ICD-10-CM | POA: Diagnosis not present

## 2013-09-09 DIAGNOSIS — E119 Type 2 diabetes mellitus without complications: Secondary | ICD-10-CM | POA: Insufficient documentation

## 2013-09-09 DIAGNOSIS — Z794 Long term (current) use of insulin: Secondary | ICD-10-CM | POA: Insufficient documentation

## 2013-09-09 DIAGNOSIS — Z79899 Other long term (current) drug therapy: Secondary | ICD-10-CM | POA: Insufficient documentation

## 2013-09-09 DIAGNOSIS — M549 Dorsalgia, unspecified: Secondary | ICD-10-CM | POA: Insufficient documentation

## 2013-09-09 DIAGNOSIS — I4891 Unspecified atrial fibrillation: Secondary | ICD-10-CM | POA: Insufficient documentation

## 2013-09-09 DIAGNOSIS — Z9861 Coronary angioplasty status: Secondary | ICD-10-CM | POA: Insufficient documentation

## 2013-09-09 DIAGNOSIS — I509 Heart failure, unspecified: Secondary | ICD-10-CM | POA: Insufficient documentation

## 2013-09-09 DIAGNOSIS — Z95810 Presence of automatic (implantable) cardiac defibrillator: Secondary | ICD-10-CM | POA: Insufficient documentation

## 2013-09-09 DIAGNOSIS — I2510 Atherosclerotic heart disease of native coronary artery without angina pectoris: Secondary | ICD-10-CM | POA: Insufficient documentation

## 2013-09-09 HISTORY — DX: Other pulmonary embolism without acute cor pulmonale: I26.99

## 2013-09-09 HISTORY — DX: Acute embolism and thrombosis of unspecified deep veins of unspecified lower extremity: I82.409

## 2013-09-09 MED ORDER — HYDROCODONE-ACETAMINOPHEN 5-325 MG PO TABS: 1.0000 | ORAL_TABLET | ORAL | Status: DC | PRN

## 2013-09-09 MED ORDER — METHOCARBAMOL 500 MG PO TABS: 500.0000 mg | ORAL_TABLET | Freq: Three times a day (TID) | ORAL | Status: DC | PRN

## 2013-09-09 MED ORDER — HYDROCODONE-ACETAMINOPHEN 5-325 MG PO TABS
1.0000 | ORAL_TABLET | Freq: Once | ORAL | Status: AC
  Administered 2013-09-09: 1 via ORAL
  Filled 2013-09-09: qty 1

## 2013-09-09 NOTE — ED Provider Notes (Signed)
Medical screening examination/treatment/procedure(s) were conducted as a shared visit with non-physician practitioner(s) and myself.  I personally evaluated the patient during the encounter.   EKG Interpretation None      Musculoskeletal back pain. Doubt spinal epidural abscess. Doubt AAA. Well appearing. Normal strength in lower extremities  Hoy Morn, MD 09/09/13 2148

## 2013-09-09 NOTE — Discharge Instructions (Signed)
Heat Therapy Heat therapy can help ease sore, stiff, injured, and tight muscles and joints. Heat relaxes your muscles, which may help ease your pain.  RISKS AND COMPLICATIONS If you have any of the following conditions, do not use heat therapy unless your health care provider has approved:  Poor circulation.  Healing wounds or scarred skin in the area being treated.  Diabetes, heart disease, or high blood pressure.  Not being able to feel (numbness) the area being treated.  Unusual swelling of the area being treated.  Active infections.  Blood clots.  Cancer.  Inability to communicate pain. This may include young children and people who have problems with their brain function (dementia).  Pregnancy. Heat therapy should only be used on old, pre-existing, or long-lasting (chronic) injuries. Do not use heat therapy on new injuries unless directed by your health care provider. HOW TO USE HEAT THERAPY There are several different kinds of heat therapy, including:  Moist heat pack.  Warm water bath.  Hot water bottle.  Electric heating pad.  Heated gel pack.  Heated wrap.  Electric heating pad. Use the heat therapy method suggested by your health care provider. Follow your health care provider's instructions on when and how to use heat therapy. GENERAL HEAT THERAPY RECOMMENDATIONS  Do not sleep while using heat therapy. Only use heat therapy while you are awake.  Your skin may turn pink while using heat therapy. Do not use heat therapy if your skin turns red.  Do not use heat therapy if you have new pain.  High heat or long exposure to heat can cause burns. Be careful when using heat therapy to avoid burning your skin.  Do not use heat therapy on areas of your skin that are already irritated, such as with a rash or sunburn. SEEK MEDICAL CARE IF:  You have blisters, redness, swelling, or numbness.  You have new pain.  Your pain is worse. MAKE SURE  YOU:  Understand these instructions.  Will watch your condition.  Will get help right away if you are not doing well or get worse. Document Released: 03/15/2011 Document Revised: 05/07/2013 Document Reviewed: 02/13/2013 [facility] Patient Information 2015 [place], [place]. This information is not intended to replace advice given to you by your health care provider. Make sure you discuss any questions you have with your health care provider. Cryotherapy Cryotherapy means treatment with cold. Ice or gel packs can be used to reduce both pain and swelling. Ice is the most helpful within the first 24 to 48 hours after an injury or flare-up from overusing a muscle or joint. Sprains, strains, spasms, burning pain, shooting pain, and aches can all be eased with ice. Ice can also be used when recovering from surgery. Ice is effective, has very few side effects, and is safe for most people to use. PRECAUTIONS  Ice is not a safe treatment option for people with:  Raynaud phenomenon. This is a condition affecting small blood vessels in the extremities. Exposure to cold may cause your problems to return.  Cold hypersensitivity. There are many forms of cold hypersensitivity, including:  Cold urticaria. Red, itchy hives appear on the skin when the tissues begin to warm after being iced.  Cold erythema. This is a red, itchy rash caused by exposure to cold.  Cold hemoglobinuria. Red blood cells break down when the tissues begin to warm after being iced. The hemoglobin that carry oxygen are passed into the urine because they cannot combine with blood proteins fast enough.  Numbness or altered sensitivity in the area being iced. If you have any of the following conditions, do not use ice until you have discussed cryotherapy with your caregiver:  Heart conditions, such as arrhythmia, angina, or chronic heart disease.  High blood pressure.  Healing wounds or open skin in the area being iced.  Current  infections.  Rheumatoid arthritis.  Poor circulation.  Diabetes. Ice slows the blood flow in the region it is applied. This is beneficial when trying to stop inflamed tissues from spreading irritating chemicals to surrounding tissues. However, if you expose your skin to cold temperatures for too long or without the proper protection, you can damage your skin or nerves. Watch for signs of skin damage due to cold. HOME CARE INSTRUCTIONS Follow these tips to use ice and cold packs safely.  Place a dry or damp towel between the ice and skin. A damp towel will cool the skin more quickly, so you may need to shorten the time that the ice is used.  For a more rapid response, add gentle compression to the ice.  Ice for no more than 10 to 20 minutes at a time. The bonier the area you are icing, the less time it will take to get the benefits of ice.  Check your skin after 5 minutes to make sure there are no signs of a poor response to cold or skin damage.  Rest 20 minutes or more between uses.  Once your skin is numb, you can end your treatment. You can test numbness by very lightly touching your skin. The touch should be so light that you do not see the skin dimple from the pressure of your fingertip. When using ice, most people will feel these normal sensations in this order: cold, burning, aching, and numbness.  Do not use ice on someone who cannot communicate their responses to pain, such as small children or people with dementia. HOW TO MAKE AN ICE PACK Ice packs are the most common way to use ice therapy. Other methods include ice massage, ice baths, and cryosprays. Muscle creams that cause a cold, tingly feeling do not offer the same benefits that ice offers and should not be used as a substitute unless recommended by your caregiver. To make an ice pack, do one of the following:  Place crushed ice or a bag of frozen vegetables in a sealable plastic bag. Squeeze out the excess air. Place this  bag inside another plastic bag. Slide the bag into a pillowcase or place a damp towel between your skin and the bag.  Mix 3 parts water with 1 part rubbing alcohol. Freeze the mixture in a sealable plastic bag. When you remove the mixture from the freezer, it will be slushy. Squeeze out the excess air. Place this bag inside another plastic bag. Slide the bag into a pillowcase or place a damp towel between your skin and the bag. SEEK MEDICAL CARE IF:  You develop white spots on your skin. This may give the skin a blotchy (mottled) appearance.  Your skin turns blue or pale.  Your skin becomes waxy or hard.  Your swelling gets worse. MAKE SURE YOU:   Understand these instructions.  Will watch your condition.  Will get help right away if you are not doing well or get worse. Document Released: 08/17/2010 Document Revised: 05/07/2013 Document Reviewed: 08/17/2010 [facility] Patient Information 2015 [place], [place]. This information is not intended to replace advice given to you by your health care provider. Make  sure you discuss any questions you have with your health care provider. Back Pain, Adult Low back pain is very common. About 1 in 5 people have back pain.The cause of low back pain is rarely dangerous. The pain often gets better over time.About half of people with a sudden onset of back pain feel better in just 2 weeks. About 8 in 10 people feel better by 6 weeks.  CAUSES Some common causes of back pain include:  Strain of the muscles or ligaments supporting the spine.  Wear and tear (degeneration) of the spinal discs.  Arthritis.  Direct injury to the back. DIAGNOSIS Most of the time, the direct cause of low back pain is not known.However, back pain can be treated effectively even when the exact cause of the pain is unknown.Answering your caregiver's questions about your overall health and symptoms is one of the most accurate ways to make sure the cause of your pain is not  dangerous. If your caregiver needs more information, he or she may order lab work or imaging tests (X-rays or MRIs).However, even if imaging tests show changes in your back, this usually does not require surgery. HOME CARE INSTRUCTIONS For many people, back pain returns.Since low back pain is rarely dangerous, it is often a condition that people can learn to [facility] their own.   Remain active. It is stressful on the back to sit or stand in one place. Do not sit, drive, or stand in one place for more than 30 minutes at a time. Take short walks on level surfaces as soon as pain allows.Try to increase the length of time you walk each day.  Do not stay in bed.Resting more than 1 or 2 days can delay your recovery.  Do not avoid exercise or work.Your body is made to move.It is not dangerous to be active, even though your back may hurt.Your back will likely heal faster if you return to being active before your pain is gone.  Pay attention to your body when you bend and lift. Many people have less discomfortwhen lifting if they bend their knees, keep the load close to their bodies,and avoid twisting. Often, the most comfortable positions are those that put less stress on your recovering back.  Find a comfortable position to sleep. Use a firm mattress and lie on your side with your knees slightly bent. If you lie on your back, put a pillow under your knees.  Only take over-the-counter or prescription medicines as directed by your caregiver. Over-the-counter medicines to reduce pain and inflammation are often the most helpful.Your caregiver may prescribe muscle relaxant drugs.These medicines help dull your pain so you can more quickly return to your normal activities and healthy exercise.  Put ice on the injured area.  Put ice in a plastic bag.  Place a towel between your skin and the bag.  Leave the ice on for 15-20 minutes, 03-04 times a day for the first 2 to 3 days. After that, ice and  heat may be alternated to reduce pain and spasms.  Ask your caregiver about trying back exercises and gentle massage. This may be of some benefit.  Avoid feeling anxious or stressed.Stress increases muscle tension and can worsen back pain.It is important to recognize when you are anxious or stressed and learn ways to manage it.Exercise is a great option. SEEK MEDICAL CARE IF:  You have pain that is not relieved with rest or medicine.  You have pain that does not improve in 1 week.  You have new symptoms.  You are generally not feeling well. SEEK IMMEDIATE MEDICAL CARE IF:   You have pain that radiates from your back into your legs.  You develop new bowel or bladder control problems.  You have unusual weakness or numbness in your arms or legs.  You develop nausea or vomiting.  You develop abdominal pain.  You feel faint. Document Released: 12/21/2004 Document Revised: 06/22/2011 Document Reviewed: 04/24/2013 [facility] Patient Information 2015 [place], [place]. This information is not intended to replace advice given to you by your health care provider. Make sure you discuss any questions you have with your health care provider.  Back Exercises Back exercises help treat and prevent back injuries. The goal is to increase your strength in your belly (abdominal) and back muscles. These exercises can also help with flexibility. Start these exercises when told by your doctor. HOME CARE Back exercises include: Pelvic Tilt.  Lie on your back with your knees bent. Tilt your pelvis until the lower part of your back is against the floor. Hold this position 5 to 10 sec. Repeat this exercise 5 to 10 times. Knee to Chest.  Pull 1 knee up against your chest and hold for 20 to 30 seconds. Repeat this with the other knee. This may be done with the other leg straight or bent, whichever feels better. Then, pull both knees up against your chest. Sit-Ups or Curl-Ups.  Bend your knees 90 degrees.  Start with tilting your pelvis, and do a partial, slow sit-up. Only lift your upper half 30 to 45 degrees off the floor. Take at least 2 to 3 seonds for each sit-up. Do not do sit-ups with your knees out straight. If partial sit-ups are difficult, simply do the above but with only tightening your belly (abdominal) muscles and holding it as told. Hip-Lift.  Lie on your back with your knees flexed 90 degrees. Push down with your feet and shoulders as you raise your hips 2 inches off the floor. Hold for 10 seconds, repeat 5 to 10 times. Back Arches.  Lie on your stomach. Prop yourself up on bent elbows. Slowly press on your hands, causing an arch in your low back. Repeat 3 to 5 times. Shoulder-Lifts.  Lie face down with arms beside your body. Keep hips and belly pressed to floor as you slowly lift your head and shoulders off the floor. Do not overdo your exercises. Be careful in the beginning. Exercises may cause you some mild back discomfort. If the pain lasts for more than 15 minutes, stop the exercises until you see your doctor. Improvement with exercise for back problems is slow.  Document Released: 01/23/2010 Document Revised: 03/15/2011 Document Reviewed: 10/22/2010 [facility] Patient Information 2015 [place], [place]. This information is not intended to replace advice given to you by your health care provider. Make sure you discuss any questions you have with your health care provider.

## 2013-09-09 NOTE — ED Notes (Signed)
Declined W/C at D/C and was escorted to lobby by RN. 

## 2013-09-09 NOTE — ED Provider Notes (Signed)
CSN: [id number]     Arrival date & time 09/09/13  0958 History   This chart was scribed for non-physician practitioner, Charlann Lange, PA-C working with Hoy Morn, MD, by Erling Conte, ED Scribe. This patient was seen in room TR06C/TR06C and the patient's care was started at 10:07 AM.    Chief Complaint  Patient presents with  . Back Pain     The history is provided by the patient. No language interpreter was used.   HPI Comments: Shelley Douglas is a 63 y.o. female with a h/o a-fib, CAD, mitral valve regurgitation, DM, hyperthyroidism, CVA, CHF, anxiety and obesity, who presents to the Emergency Department complaining of intermittent, moderate, "5/10", non radiating, lumbar back pain for 3 days. She states that the pain is exacerbated with any kind of movement. She admits that the pain makes it hard to stand up or move. She denies any heavy lifting or injury. She notes that the pain is relieved with sitting at rest. She applied heating pads with mild relief. She denies any fever, chills, gait issues, abdominal pain, urinary or bowel incontinence, flank pain, dysuria or urinary frequency. Pt takes blood thinners for her a-fib and h/o DVT.    Past Medical History  Diagnosis Date  . Atrial fibrillation   . CAD (coronary artery disease)   . Mitral valve regurgitation   . DM (diabetes mellitus)   . Hyperthyroidism   . Stroke 2001  . CHF (congestive heart failure)   . Dizziness   . Anxiety   . Obesity    Past Surgical History  Procedure Laterality Date  . Coronary angioplasty with stent placement    . Coronary artery bypass graft    . Cardiac defibrillator placement      [employer]  . Vena cava filter placement     No family history on file. History  Substance Use Topics  . Smoking status: Former Smoker    Quit date: 01/05/1988  . Smokeless tobacco: Not on file  . Alcohol Use: No   OB History   Grav Para Term Preterm Abortions TAB SAB Ect Mult Living   4 4 4  0 0 0 0  0 0 4     Review of Systems  Constitutional: Negative for fever and chills.  Gastrointestinal: Negative for abdominal pain.       No bowel incontinence  Genitourinary: Negative for dysuria, frequency and flank pain.       No urinary incontinence  Musculoskeletal: Positive for back pain. Negative for gait problem.  All other systems reviewed and are negative.     Allergies  Clopidogrel bisulfate  Home Medications   Prior to Admission medications   Medication Sig Start Date End Date Taking? Authorizing Provider  digoxin (LANOXIN) 0.125 MG tablet Take 125 mcg by mouth daily.     Historical Provider, MD  folic acid (FOLVITE) 1 MG tablet Take 1 mg by mouth daily.     Historical Provider, MD  furosemide (LASIX) 40 MG tablet TAKE ONE TABLET BY MOUTH EVERY DAY 06/03/12   Deboraha Sprang, MD  furosemide (LASIX) 40 MG tablet Take 1 tablet (40 mg total) by mouth daily. 07/06/12   Deboraha Sprang, MD  insulin aspart (NOVOLOG) 100 UNIT/ML injection Inject 8 Units into the skin 2 (two) times daily.     Historical Provider, MD  losartan (COZAAR) 50 MG tablet Take 50 mg by mouth daily.     Historical Provider, MD  oxyCODONE-acetaminophen (PERCOCET/ROXICET) 5-325 MG per  tablet Take 1 tablet by mouth every 4 (four) hours as needed for pain. 11/20/11   Virgel Manifold, MD  warfarin (COUMADIN) 10 MG tablet Take 15-20 mg by mouth daily. Takes 15 mg on Mon, Tues, Weds, & Thurs and 20 mg all other days of the week.    Historical Provider, MD   Triage Vitals: BP 150/78  Pulse 96  Temp(Src) 97.6 F (36.4 C) (Oral)  Resp 18  SpO2 97%  Physical Exam  Nursing note and vitals reviewed. Constitutional: She is oriented to person, place, and time. She appears well-developed and well-nourished. No distress.  HENT:  Head: Normocephalic and atraumatic.  Eyes: Conjunctivae and EOM are normal.  Neck: Neck supple. No tracheal deviation present.  Cardiovascular: Normal rate.   Pulmonary/Chest: Effort normal. No  respiratory distress.  Abdominal: Soft. Bowel sounds are normal. There is no tenderness.  Musculoskeletal: Normal range of motion. She exhibits tenderness.  right paraspinal paralumbar tenderness w/o swelling. No right sciatic pain.  Reflexes are equal in bilateral lower extremities. Distal pulses intact  Neurological: She is alert and oriented to person, place, and time. No cranial nerve deficit.  neuro sensory of lower extremities is w/o deficit  Skin: Skin is warm and dry.  Psychiatric: She has a normal mood and affect. Her behavior is normal.    ED Course  Procedures (including critical care time)  DIAGNOSTIC STUDIES: Oxygen Saturation is 97% on RA, normal by my interpretation.    COORDINATION OF CARE:    Labs Review Labs Reviewed - No data to display  Imaging Review No results found.   EKG Interpretation None      MDM   Final diagnoses:  None    1. Low back pain  Symptoms and exam support muscular pain. Dr. Venora Maples has seen and evaluated patient and agrees with diagnosis. Treated with pain medication and muscle relaxer. Encouraged PCP follow up for persistent pain.  I personally performed the services described in this documentation, which was scribed in my presence. The recorded information has been reviewed and is accurate.  \   Dewaine Oats, PA-C 09/09/13 1105

## 2013-09-09 NOTE — ED Notes (Signed)
She states shes had lower back pain for past several days. Denies any injuries. She reports shes had back pain in the past that would resolve with tylenol but she did not try that this time. She denies any bowel/bladder changes. She is ambulatory

## 2013-10-18 ENCOUNTER — Other Ambulatory Visit: Payer: Self-pay

## 2013-10-18 ENCOUNTER — Other Ambulatory Visit: Payer: Self-pay | Admitting: Internal Medicine

## 2013-10-18 DIAGNOSIS — Z1231 Encounter for screening mammogram for malignant neoplasm of breast: Secondary | ICD-10-CM

## 2013-10-31 ENCOUNTER — Ambulatory Visit
Admission: RE | Admit: 2013-10-31 | Discharge: 2013-10-31 | Disposition: A | Discharge status: Home / Self Care | Payer: Medicare Other | Source: Ambulatory Visit

## 2013-10-31 DIAGNOSIS — Z1231 Encounter for screening mammogram for malignant neoplasm of breast: Secondary | ICD-10-CM

## 2013-11-05 ENCOUNTER — Encounter (HOSPITAL_COMMUNITY): Payer: Self-pay | Admitting: Emergency Medicine

## 2014-09-24 ENCOUNTER — Observation Stay (HOSPITAL_COMMUNITY): Payer: Medicare HMO

## 2014-09-24 ENCOUNTER — Inpatient Hospital Stay (HOSPITAL_COMMUNITY)
Admission: AD | Admit: 2014-09-24 | Discharge: 2014-09-28 | DRG: 287 | Disposition: A | Discharge status: Home / Self Care | Payer: Medicare HMO | Source: Ambulatory Visit | Attending: Cardiovascular Disease | Admitting: Cardiovascular Disease

## 2014-09-24 DIAGNOSIS — I5021 Acute systolic (congestive) heart failure: Secondary | ICD-10-CM | POA: Diagnosis present

## 2014-09-24 DIAGNOSIS — R42 Dizziness and giddiness: Secondary | ICD-10-CM

## 2014-09-24 DIAGNOSIS — Z87891 Personal history of nicotine dependence: Secondary | ICD-10-CM

## 2014-09-24 DIAGNOSIS — E669 Obesity, unspecified: Secondary | ICD-10-CM | POA: Diagnosis present

## 2014-09-24 DIAGNOSIS — E119 Type 2 diabetes mellitus without complications: Secondary | ICD-10-CM | POA: Diagnosis present

## 2014-09-24 DIAGNOSIS — I6350 Cerebral infarction due to unspecified occlusion or stenosis of unspecified cerebral artery: Secondary | ICD-10-CM | POA: Diagnosis present

## 2014-09-24 DIAGNOSIS — I2582 Chronic total occlusion of coronary artery: Secondary | ICD-10-CM | POA: Diagnosis present

## 2014-09-24 DIAGNOSIS — I480 Paroxysmal atrial fibrillation: Secondary | ICD-10-CM | POA: Diagnosis present

## 2014-09-24 DIAGNOSIS — Z7901 Long term (current) use of anticoagulants: Secondary | ICD-10-CM

## 2014-09-24 DIAGNOSIS — I272 Other secondary pulmonary hypertension: Secondary | ICD-10-CM | POA: Diagnosis present

## 2014-09-24 DIAGNOSIS — Z6832 Body mass index (BMI) 32.0-32.9, adult: Secondary | ICD-10-CM

## 2014-09-24 DIAGNOSIS — I420 Dilated cardiomyopathy: Secondary | ICD-10-CM | POA: Diagnosis present

## 2014-09-24 DIAGNOSIS — Z794 Long term (current) use of insulin: Secondary | ICD-10-CM

## 2014-09-24 DIAGNOSIS — Z8673 Personal history of transient ischemic attack (TIA), and cerebral infarction without residual deficits: Secondary | ICD-10-CM

## 2014-09-24 DIAGNOSIS — I340 Nonrheumatic mitral (valve) insufficiency: Secondary | ICD-10-CM | POA: Diagnosis present

## 2014-09-24 DIAGNOSIS — IMO0001 Reserved for inherently not codable concepts without codable children: Secondary | ICD-10-CM

## 2014-09-24 DIAGNOSIS — I5023 Acute on chronic systolic (congestive) heart failure: Principal | ICD-10-CM | POA: Diagnosis present

## 2014-09-24 DIAGNOSIS — Z95810 Presence of automatic (implantable) cardiac defibrillator: Secondary | ICD-10-CM

## 2014-09-24 DIAGNOSIS — Z888 Allergy status to other drugs, medicaments and biological substances status: Secondary | ICD-10-CM

## 2014-09-24 DIAGNOSIS — I10 Essential (primary) hypertension: Secondary | ICD-10-CM | POA: Diagnosis present

## 2014-09-24 DIAGNOSIS — Z951 Presence of aortocoronary bypass graft: Secondary | ICD-10-CM

## 2014-09-24 DIAGNOSIS — Z79899 Other long term (current) drug therapy: Secondary | ICD-10-CM

## 2014-09-24 DIAGNOSIS — I071 Rheumatic tricuspid insufficiency: Secondary | ICD-10-CM | POA: Diagnosis present

## 2014-09-24 DIAGNOSIS — I2510 Atherosclerotic heart disease of native coronary artery without angina pectoris: Secondary | ICD-10-CM

## 2014-09-24 DIAGNOSIS — Z95828 Presence of other vascular implants and grafts: Secondary | ICD-10-CM

## 2014-09-24 DIAGNOSIS — Z86718 Personal history of other venous thrombosis and embolism: Secondary | ICD-10-CM

## 2014-09-24 DIAGNOSIS — J45909 Unspecified asthma, uncomplicated: Secondary | ICD-10-CM | POA: Diagnosis present

## 2014-09-24 DIAGNOSIS — I110 Hypertensive heart disease with heart failure: Secondary | ICD-10-CM

## 2014-09-24 LAB — APTT: aPTT: 31 seconds (ref 24–37)

## 2014-09-24 LAB — CBC WITH DIFFERENTIAL/PLATELET
Basophils Absolute: 0 10*3/uL (ref 0.0–0.1)
Basophils Relative: 1 %
EOS ABS: 0.1 10*3/uL (ref 0.0–0.7)
Eosinophils Relative: 4 %
HCT: 39 % (ref 36.0–46.0)
HEMOGLOBIN: 12.6 g/dL (ref 12.0–15.0)
LYMPHS ABS: 1.6 10*3/uL (ref 0.7–4.0)
Lymphocytes Relative: 43 %
MCH: 28.5 pg (ref 26.0–34.0)
MCHC: 32.3 g/dL (ref 30.0–36.0)
MCV: 88.2 fL (ref 78.0–100.0)
Monocytes Absolute: 0.5 10*3/uL (ref 0.1–1.0)
Monocytes Relative: 12 %
NEUTROS ABS: 1.5 10*3/uL — AB (ref 1.7–7.7)
NEUTROS PCT: 40 %
Platelets: 202 10*3/uL (ref 150–400)
RBC: 4.42 MIL/uL (ref 3.87–5.11)
RDW: 14.4 % (ref 11.5–15.5)
WBC: 3.7 10*3/uL — AB (ref 4.0–10.5)

## 2014-09-24 LAB — COMPREHENSIVE METABOLIC PANEL
ALT: 16 U/L (ref 14–54)
ANION GAP: 7 (ref 5–15)
AST: 22 U/L (ref 15–41)
Albumin: 3.7 g/dL (ref 3.5–5.0)
Alkaline Phosphatase: 78 U/L (ref 38–126)
BUN: 14 mg/dL (ref 6–20)
CHLORIDE: 103 mmol/L (ref 101–111)
CO2: 28 mmol/L (ref 22–32)
CREATININE: 0.82 mg/dL (ref 0.44–1.00)
Calcium: 10.4 mg/dL — ABNORMAL HIGH (ref 8.9–10.3)
Glucose, Bld: 79 mg/dL (ref 65–99)
POTASSIUM: 4.1 mmol/L (ref 3.5–5.1)
Sodium: 138 mmol/L (ref 135–145)
Total Bilirubin: 0.3 mg/dL (ref 0.3–1.2)
Total Protein: 8.2 g/dL — ABNORMAL HIGH (ref 6.5–8.1)

## 2014-09-24 LAB — GLUCOSE, CAPILLARY: Glucose-Capillary: 88 mg/dL (ref 65–99)

## 2014-09-24 LAB — PROTIME-INR
INR: 1.23 (ref 0.00–1.49)
PROTHROMBIN TIME: 15.6 s — AB (ref 11.6–15.2)

## 2014-09-24 LAB — TROPONIN I

## 2014-09-24 MED ORDER — ALUM & MAG HYDROXIDE-SIMETH 200-200-20 MG/5ML PO SUSP: 30.0000 mL | Freq: Four times a day (QID) | ORAL | Status: DC | PRN

## 2014-09-24 MED ORDER — RIVAROXABAN 20 MG PO TABS: 20.0000 mg | ORAL_TABLET | Freq: Every day | ORAL | Status: DC

## 2014-09-24 MED ORDER — FOLIC ACID 1 MG PO TABS
1.0000 mg | ORAL_TABLET | Freq: Every day | ORAL | Status: DC
  Administered 2014-09-26 – 2014-09-28 (×2): 1 mg via ORAL
  Filled 2014-09-24 (×3): qty 1

## 2014-09-24 MED ORDER — ONDANSETRON HCL 4 MG/2ML IJ SOLN: 4.0000 mg | Freq: Four times a day (QID) | INTRAMUSCULAR | Status: DC | PRN

## 2014-09-24 MED ORDER — ACETAMINOPHEN 650 MG RE SUPP: 650.0000 mg | Freq: Four times a day (QID) | RECTAL | Status: DC | PRN

## 2014-09-24 MED ORDER — MECLIZINE HCL 25 MG PO TABS
12.5000 mg | ORAL_TABLET | Freq: Three times a day (TID) | ORAL | Status: DC
  Administered 2014-09-24 – 2014-09-28 (×7): 12.5 mg via ORAL
  Filled 2014-09-24 (×12): qty 1

## 2014-09-24 MED ORDER — SODIUM CHLORIDE 0.9 % IJ SOLN
3.0000 mL | Freq: Two times a day (BID) | INTRAMUSCULAR | Status: DC
  Administered 2014-09-24 – 2014-09-28 (×2): 3 mL via INTRAVENOUS

## 2014-09-24 MED ORDER — ACETAMINOPHEN 325 MG PO TABS: 650.0000 mg | ORAL_TABLET | Freq: Four times a day (QID) | ORAL | Status: DC | PRN

## 2014-09-24 MED ORDER — ONDANSETRON HCL 4 MG PO TABS: 4.0000 mg | ORAL_TABLET | Freq: Four times a day (QID) | ORAL | Status: DC | PRN

## 2014-09-24 MED ORDER — DIGOXIN 125 MCG PO TABS
125.0000 ug | ORAL_TABLET | Freq: Every day | ORAL | Status: DC
  Administered 2014-09-26 – 2014-09-28 (×2): 125 ug via ORAL
  Filled 2014-09-24 (×3): qty 1

## 2014-09-24 MED ORDER — DOCUSATE SODIUM 100 MG PO CAPS
100.0000 mg | ORAL_CAPSULE | Freq: Two times a day (BID) | ORAL | Status: DC
  Administered 2014-09-26 (×2): 100 mg via ORAL
  Filled 2014-09-24 (×6): qty 1

## 2014-09-24 MED ORDER — RIVAROXABAN 20 MG PO TABS
20.0000 mg | ORAL_TABLET | Freq: Every day | ORAL | Status: DC
  Administered 2014-09-25: 20 mg via ORAL
  Filled 2014-09-24: qty 1

## 2014-09-24 MED ORDER — SODIUM CHLORIDE 0.9 % IV SOLN
INTRAVENOUS | Status: DC
  Administered 2014-09-24 – 2014-09-25 (×2): via INTRAVENOUS

## 2014-09-24 MED ORDER — HYDROCODONE-ACETAMINOPHEN 5-325 MG PO TABS: 1.0000 | ORAL_TABLET | ORAL | Status: DC | PRN

## 2014-09-24 NOTE — Progress Notes (Signed)
Shelley Douglas [id number] Admission Data: 09/24/2014 4:22 PM Attending Provider: Dixie Dials, MD  QLR:[id number],KDPTE, MD Consults/ Treatment Team:    Shelley Douglas is a 64 y.o. female patient admitted from direct admit awake, alert  & orientated  X 3,  Prior, VSS - There were no vitals taken for this visit.,  no c/o shortness of breath, no c/o chest pain, no distress noted.   Allergies:   Allergies  Allergen Reactions  . Clopidogrel Bisulfate Rash     Past Medical History  Diagnosis Date  . Atrial fibrillation   . CAD (coronary artery disease)   . Mitral valve regurgitation   . DM (diabetes mellitus)   . Hyperthyroidism   . Stroke 2001  . CHF (congestive heart failure)   . Dizziness   . Anxiety   . Obesity   . Pulmonary embolism   . DVT (deep venous thrombosis)       Pt orientation to unit, room and routine. Information packet given to patient/family and safety video watched.  Admission INP armband ID verified with patient/family, and in place. SR up x 2, fall risk assessment complete with Patient and family verbalizing understanding of risks associated with falls. Pt verbalizes an understanding of how to use the call bell and to call for help before getting out of bed.  Skin, clean-dry- intact without evidence of bruising, or skin tears.   No evidence of skin break down noted on exam.     Will cont to monitor and assist as needed.  Dayle Points, RN 09/24/2014 4:22 PM

## 2014-09-24 NOTE — H&P (Signed)
Referring Physician:  HANIYAH MACIOLEK is an 63 y.o. female.                       Chief Complaint: Dizziness  HPI: Dizziness x 4 days, while sitting. No chest pain. No fever. No cough, cold or sinus congestion. Some ringing in ears. Denies hypoglycemia.  Past Medical History  Diagnosis Date  . Atrial fibrillation   . CAD (coronary artery disease)   . Mitral valve regurgitation   . DM (diabetes mellitus)   . Hyperthyroidism   . Stroke 2001  . CHF (congestive heart failure)   . Dizziness   . Anxiety   . Obesity   . Pulmonary embolism   . DVT (deep venous thrombosis)       Past Surgical History  Procedure Laterality Date  . Coronary angioplasty with stent placement    . Coronary artery bypass graft    . Cardiac defibrillator placement      [employer]  . Vena cava filter placement      No family history on file. Social History:  reports that she quit smoking about 26 years ago. She does not have any smokeless tobacco history on file. She reports that she does not drink alcohol or use illicit drugs.  Allergies:  Allergies  Allergen Reactions  . Clopidogrel Bisulfate Rash    Medications Prior to Admission  Medication Sig Dispense Refill  . digoxin (LANOXIN) 0.125 MG tablet Take 125 mcg by mouth daily.     . folic acid (FOLVITE) 1 MG tablet Take 1 mg by mouth daily.     . furosemide (LASIX) 40 MG tablet TAKE ONE TABLET BY MOUTH EVERY DAY 30 tablet 0  . furosemide (LASIX) 40 MG tablet Take 1 tablet (40 mg total) by mouth daily. 15 tablet 0  . HYDROcodone-acetaminophen (NORCO/VICODIN) 5-325 MG per tablet Take 1-2 tablets by mouth every 4 (four) hours as needed. 12 tablet 0  . HYDROcodone-acetaminophen (NORCO/VICODIN) 5-325 MG per tablet Take 1 tablet by mouth every 4 (four) hours as needed for moderate pain. 15 tablet 0  . insulin aspart (NOVOLOG) 100 UNIT/ML injection Inject 8 Units into the skin 2 (two) times daily.     Marland Kitchen losartan (COZAAR) 50 MG tablet Take 50 mg by  mouth daily.     . methocarbamol (ROBAXIN) 500 MG tablet Take 1 tablet (500 mg total) by mouth every 8 (eight) hours as needed for muscle spasms. 20 tablet 0  . oxyCODONE-acetaminophen (PERCOCET/ROXICET) 5-325 MG per tablet Take 1 tablet by mouth every 4 (four) hours as needed for pain. 10 tablet 0  . warfarin (COUMADIN) 10 MG tablet Take 15-20 mg by mouth daily. Takes 15 mg on Mon, Tues, Weds, & Thurs and 20 mg all other days of the week.      No results found for this or any previous visit (from the past 48 hour(s)). No results found.  Review Of Systems + Lower dentures, Asthma, Chest pain, PE, DVT, Obesity, DM, II, HTN, CAD, CABG, CVA. No hepatitis, kidney stone or seizures or psych admission.   Blood pressure 128/65, pulse 84, temperature 97.7 F (36.5 C), temperature source Oral, resp. rate 18, height 5\' 7"  (1.702 m), weight 94.62 kg (208 lb 9.6 oz), SpO2 97 %.  General appearance: alert, cooperative, appears stated age and mild distress Head: Normocephalic, without obvious abnormality, atraumatic Eyes: Brown, conjunctivae/corneas clear. PERRL, EOM's intact. Neck: no adenopathy, no carotid bruit, no JVD, supple, symmetrical, trachea midline  and thyroid not enlarged. Resp: clear to auscultation bilaterally Cardio: regular rate and rhythm, S1, S2 normal, no murmur, click, rub or gallop GI: soft, distended, non-tender; bowel sounds normal. Extremities: extremities normal, atraumatic, no cyanosis or edema. Left lower leg calf tenderness and medial aspect of above ankle area tenderness Skin: Skin color, texture, turgor normal. No rashes or lesions Neurologic: Alert and oriented X 3, normal strength and tone. Normal symmetric reflexes. Normal coordination and gait. + positional vertigo. + Romberg sign Cranial nerves: grossly intact.  Assessment/Plan Dizziness Hypertension DM, II Obesity Left leg pain Defibrillator implantation history Paroxysmal atrial fibrillation H/O DVT  Place  in observation Antivert. ICD interrogation  Birdie Riddle, MD  09/24/2014, 5:35 PM

## 2014-09-25 ENCOUNTER — Encounter (HOSPITAL_COMMUNITY): Payer: Self-pay

## 2014-09-25 ENCOUNTER — Observation Stay (HOSPITAL_COMMUNITY): Payer: Medicare HMO

## 2014-09-25 ENCOUNTER — Observation Stay (HOSPITAL_BASED_OUTPATIENT_CLINIC_OR_DEPARTMENT_OTHER): Payer: Medicare HMO

## 2014-09-25 DIAGNOSIS — M79609 Pain in unspecified limb: Secondary | ICD-10-CM

## 2014-09-25 LAB — CBC
HCT: 38.2 % (ref 36.0–46.0)
HEMOGLOBIN: 12.1 g/dL (ref 12.0–15.0)
MCH: 28 pg (ref 26.0–34.0)
MCHC: 31.7 g/dL (ref 30.0–36.0)
MCV: 88.4 fL (ref 78.0–100.0)
Platelets: 178 10*3/uL (ref 150–400)
RBC: 4.32 MIL/uL (ref 3.87–5.11)
RDW: 14.4 % (ref 11.5–15.5)
WBC: 3.1 10*3/uL — AB (ref 4.0–10.5)

## 2014-09-25 LAB — TROPONIN I

## 2014-09-25 LAB — BASIC METABOLIC PANEL
ANION GAP: 4 — AB (ref 5–15)
BUN: 12 mg/dL (ref 6–20)
CHLORIDE: 108 mmol/L (ref 101–111)
CO2: 28 mmol/L (ref 22–32)
Calcium: 10.1 mg/dL (ref 8.9–10.3)
Creatinine, Ser: 0.93 mg/dL (ref 0.44–1.00)
GFR calc non Af Amer: >60 mL/min (ref >60)
Glucose, Bld: 100 mg/dL — ABNORMAL HIGH (ref 65–99)
POTASSIUM: 4.3 mmol/L (ref 3.5–5.1)
SODIUM: 140 mmol/L (ref 135–145)

## 2014-09-25 MED ORDER — TECHNETIUM TC 99M SESTAMIBI GENERIC - CARDIOLITE
30.0000 | Freq: Once | INTRAVENOUS | Status: AC | PRN
  Administered 2014-09-25: 30 via INTRAVENOUS

## 2014-09-25 MED ORDER — FUROSEMIDE 10 MG/ML IJ SOLN
40.0000 mg | Freq: Once | INTRAMUSCULAR | Status: DC
  Filled 2014-09-25: qty 4

## 2014-09-25 MED ORDER — REGADENOSON 0.4 MG/5ML IV SOLN
INTRAVENOUS | Status: AC
  Administered 2014-09-25: 0.4 mg
  Filled 2014-09-25: qty 5

## 2014-09-25 MED ORDER — HEPARIN SODIUM (PORCINE) 5000 UNIT/ML IJ SOLN
5000.0000 [IU] | Freq: Three times a day (TID) | INTRAMUSCULAR | Status: DC
  Administered 2014-09-26: 5000 [IU] via SUBCUTANEOUS
  Filled 2014-09-25: qty 1

## 2014-09-25 MED ORDER — REGADENOSON 0.4 MG/5ML IV SOLN
0.4000 mg | Freq: Once | INTRAVENOUS | Status: AC
  Administered 2014-09-25: 0.4 mg via INTRAVENOUS
  Filled 2014-09-25: qty 5

## 2014-09-25 MED ORDER — TECHNETIUM TC 99M SESTAMIBI GENERIC - CARDIOLITE
10.0000 | Freq: Once | INTRAVENOUS | Status: AC | PRN
  Administered 2014-09-25: 10 via INTRAVENOUS

## 2014-09-25 MED ORDER — FUROSEMIDE 40 MG PO TABS
40.0000 mg | ORAL_TABLET | Freq: Once | ORAL | Status: AC
  Administered 2014-09-26: 40 mg via ORAL
  Filled 2014-09-25: qty 1

## 2014-09-25 NOTE — Progress Notes (Signed)
Attempted right anterior PIV with ultrasound. Cannulated right brachial artery. Tolerated well. Catheter removed and pressure held. Dressing applied with no visible blood noted. Will continue to monitor.

## 2014-09-25 NOTE — Care Management Note (Signed)
Date:09/25/14 Spoke with patient at the bedside. Introduced self as Tourist information centre manager and explained role in discharge planning and how to be reached. Verified patient lives in town,  with spouse, Expressed no need for no other DME. Verified patient anticipates to go home with spouse,  at time of discharge and will have full-time  supervision by family  at this time to best of their knowledge. Patient  denied needing help with their medication. Patient drives  to MD appointments. Verified patient has PCP Dr. Tracey Harries.   Plan: CM will continue to follow for discharge planning and [facility] resources.Case Management Note  Patient Details  Name: Shelley Douglas MRN: [id number] Date of Birth: 01-13-1950  Subjective/Objective:                    Action/Plan:   Expected Discharge Date:                  Expected Discharge Plan:  Home/Self Care  In-House Referral:     Discharge planning Services  CM Consult  Post Acute Care Choice:    Choice offered to:     DME Arranged:    DME Agency:     HH Arranged:    [place] Agency:     Status of Service:  Completed, signed off  Medicare Important Message Given:    Date Medicare IM Given:    Medicare IM give by:    Date Additional Medicare IM Given:    Additional Medicare Important Message give by:     If discussed at [place] of Stay Meetings, dates discussed:    Additional Comments:  Zenon Mayo, RN 09/25/2014, 4:52 PM

## 2014-09-25 NOTE — Progress Notes (Addendum)
VASCULAR LAB PRELIMINARY  PRELIMINARY  PRELIMINARY  PRELIMINARY  Bilateral Venous Duplex Lower Ext. Completed. Positive for chronic appearing, non obstructing deep vein thrombosis involving the right femoral vein and popliteal vein and the left posterior tibial and peroneal veins.  Results given to [facility], patient's nurse.  Alla German, RVT 09/25/2014, 1:53 PM

## 2014-09-25 NOTE — Progress Notes (Signed)
Pt made aware of npo for stress test

## 2014-09-25 NOTE — Progress Notes (Signed)
*  PRELIMINARY RESULTS* Echocardiogram 2D Echocardiogram has been performed.  Shelley Douglas 09/25/2014, 2:49 PM

## 2014-09-25 NOTE — Progress Notes (Signed)
Notified Dr Doylene Canard of unsuccessful IV insertions by RN and IV team, gave telephone order to leave IV out.

## 2014-09-25 NOTE — Progress Notes (Signed)
Ref: GARBA,LAWAL, MD   Subjective:  Had nuclear stress test in AM. Echocardiogram shows mild MS, moderate MR and TR severe LV systolic dysfunction. Moderate pulmonary hypertension.  Objective:  Vital Signs in the last 24 hours: Temp:  [98 F (36.7 C)-98.2 F (36.8 C)] 98.2 F (36.8 C) (09/21 1505) Pulse Rate:  [73-84] 79 (09/21 1505) Cardiac Rhythm:  [-] Normal sinus rhythm (09/21 1936) Resp:  [16-18] 16 (09/21 1505) BP: (123-147)/(65-88) 127/65 mmHg (09/21 1505) SpO2:  [97 %-99 %] 99 % (09/21 1505) Weight:  [95.1 kg (209 lb 10.5 oz)] 95.1 kg (209 lb 10.5 oz) (09/21 0553)  Physical Exam: BP Readings from Last 1 Encounters:  09/25/14 127/65    Wt Readings from Last 1 Encounters:  09/25/14 95.1 kg (209 lb 10.5 oz)    Weight change:   HEENT: [place]/AT, Eyes-Brown, PERL, EOMI, Conjunctiva-Pink, Sclera-Non-icteric Neck: No JVD, No bruit, Trachea midline. Lungs:  Clear, Bilateral. Cardiac:  Regular rhythm, normal S1 and S2, no S3. II/VI systolic murmur Abdomen:  Soft, non-tender. Extremities:  No edema present. No cyanosis. No clubbing. CNS: AxOx3, Cranial nerves grossly intact, moves all 4 extremities. Right handed. Skin: Warm and dry.   Intake/Output from previous day: 09/20 0701 - 09/21 0700 In: 77.5 [I.V.:77.5] Out: 1300 [Urine:1300]    Lab Results: BMET    Component Value Date/Time   NA 140 09/25/2014 0542   NA 138 09/24/2014 1825   NA 139 05/13/2011 0605   K 4.3 09/25/2014 0542   K 4.1 09/24/2014 1825   K 4.0 05/13/2011 0605   CL 108 09/25/2014 0542   CL 103 09/24/2014 1825   CL 104 05/13/2011 0605   CO2 28 09/25/2014 0542   CO2 28 09/24/2014 1825   CO2 24 05/13/2011 0605   GLUCOSE 100* 09/25/2014 0542   GLUCOSE 79 09/24/2014 1825   GLUCOSE 112* 05/13/2011 0605   BUN 12 09/25/2014 0542   BUN 14 09/24/2014 1825   BUN 14 05/13/2011 0605   CREATININE 0.93 09/25/2014 0542   CREATININE 0.82 09/24/2014 1825   CREATININE 0.75 05/13/2011 0605   CALCIUM 10.1  09/25/2014 0542   CALCIUM 10.4* 09/24/2014 1825   CALCIUM 9.9 05/13/2011 0605   GFRNONAA >60 09/25/2014 0542   GFRNONAA >60 09/24/2014 1825   GFRNONAA 90* 05/13/2011 0605   GFRAA >60 09/25/2014 0542   GFRAA >60 09/24/2014 1825   GFRAA >90 05/13/2011 0605   CBC    Component Value Date/Time   WBC 3.1* 09/25/2014 0542   RBC 4.32 09/25/2014 0542   HGB 12.1 09/25/2014 0542   HCT 38.2 09/25/2014 0542   PLT 178 09/25/2014 0542   MCV 88.4 09/25/2014 0542   MCH 28.0 09/25/2014 0542   MCHC 31.7 09/25/2014 0542   RDW 14.4 09/25/2014 0542   LYMPHSABS 1.6 09/24/2014 1825   MONOABS 0.5 09/24/2014 1825   EOSABS 0.1 09/24/2014 1825   BASOSABS 0.0 09/24/2014 1825   HEPATIC Function Panel  Recent Labs  09/24/14 1825  PROT 8.2*   HEMOGLOBIN A1C No components found for: HGA1C,  MPG CARDIAC ENZYMES Lab Results  Component Value Date   TROPONINI <0.03 09/25/2014   TROPONINI <0.03 09/25/2014   TROPONINI <0.03 09/24/2014   BNP No results for input(s): PROBNP in the last 8760 hours. TSH No results for input(s): TSH in the last 8760 hours. CHOLESTEROL No results for input(s): CHOL in the last 8760 hours.  Scheduled Meds: . digoxin  125 mcg Oral Daily  . docusate sodium  100 mg Oral  BID  . folic acid  1 mg Oral Daily  . [START ON 09/26/2014] heparin  5,000 Units Subcutaneous 3 times per day  . meclizine  12.5 mg Oral TID  . sodium chloride  3 mL Intravenous Q12H   Continuous Infusions:  PRN Meds:.acetaminophen **OR** acetaminophen, alum & mag hydroxide-simeth, HYDROcodone-acetaminophen, ondansetron **OR** ondansetron (ZOFRAN) IV  Assessment/Plan: Dizziness Positional vertigo Hypertension DM, II Obesity Left leg pain Defibrillator implantation history Paroxysmal atrial fibrillation H/O DVT Dilated cardiomyopathy Moderate pulmonary hypertension Moderate MR and TR.   Change Xarelto to Heparin. Cardiac cath on Friday. ICD interrogation in AM.     Dixie Dials  MD   09/25/2014, 8:33 PM

## 2014-09-26 LAB — BASIC METABOLIC PANEL
Anion gap: 6 (ref 5–15)
BUN: 13 mg/dL (ref 6–20)
CO2: 28 mmol/L (ref 22–32)
Calcium: 10.3 mg/dL (ref 8.9–10.3)
Chloride: 102 mmol/L (ref 101–111)
Creatinine, Ser: 0.97 mg/dL (ref 0.44–1.00)
GFR calc Af Amer: >60 mL/min (ref >60)
GFR calc non Af Amer: >60 mL/min (ref >60)
Glucose, Bld: 106 mg/dL — ABNORMAL HIGH (ref 65–99)
Potassium: 4.3 mmol/L (ref 3.5–5.1)
Sodium: 136 mmol/L (ref 135–145)

## 2014-09-26 LAB — HEPARIN LEVEL (UNFRACTIONATED): Heparin Unfractionated: 0.46 IU/mL (ref 0.30–0.70)

## 2014-09-26 LAB — APTT: aPTT: 25 seconds (ref 24–37)

## 2014-09-26 MED ORDER — ASPIRIN 81 MG PO CHEW: 81.0000 mg | CHEWABLE_TABLET | ORAL | Status: DC

## 2014-09-26 MED ORDER — SODIUM CHLORIDE 0.9 % IV SOLN
INTRAVENOUS | Status: DC
  Administered 2014-09-26 – 2014-09-27 (×2): via INTRAVENOUS

## 2014-09-26 MED ORDER — HEPARIN (PORCINE) IN NACL 100-0.45 UNIT/ML-% IJ SOLN
1200.0000 [IU]/h | INTRAMUSCULAR | Status: DC
  Administered 2014-09-26: 1200 [IU]/h via INTRAVENOUS
  Filled 2014-09-26: qty 250

## 2014-09-26 MED ORDER — SODIUM CHLORIDE 0.9 % IJ SOLN
3.0000 mL | Freq: Two times a day (BID) | INTRAMUSCULAR | Status: DC
Start: 2014-09-26 — End: 2014-09-27

## 2014-09-26 MED ORDER — SODIUM CHLORIDE 0.9 % IV SOLN: 250.0000 mL | INTRAVENOUS | Status: DC | PRN

## 2014-09-26 MED ORDER — SODIUM CHLORIDE 0.9 % IJ SOLN: 3.0000 mL | INTRAMUSCULAR | Status: DC | PRN

## 2014-09-26 NOTE — Progress Notes (Signed)
Ref: Barbette Merino, MD   Subjective:  Feeling better with lasix use. ICD interrogation showed atrial fibrillation in August, No end of battery life.   Objective:  Vital Signs in the last 24 hours: Temp:  [97.6 F (36.4 C)-98.3 F (36.8 C)] 97.6 F (36.4 C) (09/22 1357) Pulse Rate:  [76-83] 83 (09/22 1357) Cardiac Rhythm:  [-] Normal sinus rhythm;Bundle branch block (09/22 0701) Resp:  [18-19] 19 (09/22 1357) BP: (110-119)/(60-62) 119/62 mmHg (09/22 1357) SpO2:  [92 %-97 %] 93 % (09/22 1357) Weight:  [92.761 kg (204 lb 8 oz)] 92.761 kg (204 lb 8 oz) (09/22 0557)  Physical Exam: BP Readings from Last 1 Encounters:  09/26/14 119/62    Wt Readings from Last 1 Encounters:  09/26/14 92.761 kg (204 lb 8 oz)    Weight change: -1.86 kg (-4 lb 1.6 oz)  HEENT: [place]/AT, Eyes-Brown, PERL, EOMI, Conjunctiva-Pink, Sclera-Non-icteric Neck: No JVD, No bruit, Trachea midline. Lungs:  Clear, Bilateral. Cardiac:  Regular rhythm, normal S1 and S2, no S3.  Abdomen:  Soft, non-tender. Extremities:  No edema present. No cyanosis. No clubbing. CNS: AxOx3, Cranial nerves grossly intact, moves all 4 extremities. Right handed. Skin: Warm and dry.   Intake/Output from previous day: 09/21 0701 - 09/22 0700 In: 0  Out: 4100 [Urine:4100]    Lab Results: BMET    Component Value Date/Time   NA 136 09/26/2014 0628   NA 140 09/25/2014 0542   NA 138 09/24/2014 1825   K 4.3 09/26/2014 0628   K 4.3 09/25/2014 0542   K 4.1 09/24/2014 1825   CL 102 09/26/2014 0628   CL 108 09/25/2014 0542   CL 103 09/24/2014 1825   CO2 28 09/26/2014 0628   CO2 28 09/25/2014 0542   CO2 28 09/24/2014 1825   GLUCOSE 106* 09/26/2014 0628   GLUCOSE 100* 09/25/2014 0542   GLUCOSE 79 09/24/2014 1825   BUN 13 09/26/2014 0628   BUN 12 09/25/2014 0542   BUN 14 09/24/2014 1825   CREATININE 0.97 09/26/2014 0628   CREATININE 0.93 09/25/2014 0542   CREATININE 0.82 09/24/2014 1825   CALCIUM 10.3 09/26/2014 0628   CALCIUM  10.1 09/25/2014 0542   CALCIUM 10.4* 09/24/2014 1825   GFRNONAA >60 09/26/2014 0628   GFRNONAA >60 09/25/2014 0542   GFRNONAA >60 09/24/2014 1825   GFRAA >60 09/26/2014 0628   GFRAA >60 09/25/2014 0542   GFRAA >60 09/24/2014 1825   CBC    Component Value Date/Time   WBC 3.1* 09/25/2014 0542   RBC 4.32 09/25/2014 0542   HGB 12.1 09/25/2014 0542   HCT 38.2 09/25/2014 0542   PLT 178 09/25/2014 0542   MCV 88.4 09/25/2014 0542   MCH 28.0 09/25/2014 0542   MCHC 31.7 09/25/2014 0542   RDW 14.4 09/25/2014 0542   LYMPHSABS 1.6 09/24/2014 1825   MONOABS 0.5 09/24/2014 1825   EOSABS 0.1 09/24/2014 1825   BASOSABS 0.0 09/24/2014 1825   HEPATIC Function Panel  Recent Labs  09/24/14 1825  PROT 8.2*   HEMOGLOBIN A1C No components found for: HGA1C,  MPG CARDIAC ENZYMES Lab Results  Component Value Date   TROPONINI <0.03 09/25/2014   TROPONINI <0.03 09/25/2014   TROPONINI <0.03 09/24/2014   BNP No results for input(s): PROBNP in the last 8760 hours. TSH No results for input(s): TSH in the last 8760 hours. CHOLESTEROL No results for input(s): CHOL in the last 8760 hours.  Scheduled Meds: . digoxin  125 mcg Oral Daily  . docusate sodium  100  mg Oral BID  . folic acid  1 mg Oral Daily  . furosemide  40 mg Intravenous Once  . meclizine  12.5 mg Oral TID  . sodium chloride  3 mL Intravenous Q12H   Continuous Infusions: . heparin 1,200 Units/hr (09/26/14 1655)   PRN Meds:.acetaminophen **OR** acetaminophen, alum & mag hydroxide-simeth, HYDROcodone-acetaminophen, ondansetron **OR** ondansetron (ZOFRAN) IV  Assessment/Plan: Dizziness Positional vertigo Hypertension DM, II Obesity Left leg pain Defibrillator implantation history Paroxysmal atrial fibrillation H/O DVT Dilated cardiomyopathy Moderate pulmonary hypertension Moderate MR and TR   Right and left heart cath in AM.     Dixie Dials  MD  09/26/2014, 7:27 PM

## 2014-09-26 NOTE — Progress Notes (Signed)
ANTICOAGULATION CONSULT NOTE - Initial Consult  Pharmacy Consult for Heparin infusion Indication: hx afib, hx DVT/PE, Xarelto on hold for cardiac cath  Allergies  Allergen Reactions  . Clopidogrel Bisulfate Rash    Patient Measurements: Height: 5\' 7"  (170.2 cm) Weight: 204 lb 8 oz (92.761 kg) IBW/kg (Calculated) : 61.6 Heparin Dosing Weight: 82 kg  Vital Signs: Temp: 97.8 F (36.6 C) (09/22 0557) Temp Source: Oral (09/22 0557) BP: 118/60 mmHg (09/22 0557) Pulse Rate: 76 (09/22 0557)  Labs:  Recent Labs  09/24/14 1825 09/25/14 0018 09/25/14 0542 09/26/14 0628  HGB 12.6  --  12.1  --   HCT 39.0  --  38.2  --   PLT 202  --  178  --   APTT 31  --   --   --   LABPROT 15.6*  --   --   --   INR 1.23  --   --   --   CREATININE 0.82  --  0.93 0.97  TROPONINI <0.03 <0.03 <0.03  --     Estimated Creatinine Clearance: 68.5 mL/min (by C-G formula based on Cr of 0.97).   Medical History: Past Medical History  Diagnosis Date  . Atrial fibrillation   . CAD (coronary artery disease)   . Mitral valve regurgitation   . DM (diabetes mellitus)   . Hyperthyroidism   . Stroke 2001  . CHF (congestive heart failure)   . Dizziness   . Anxiety   . Obesity   . Pulmonary embolism   . DVT (deep venous thrombosis)     Medications:  Scheduled:  . digoxin  125 mcg Oral Daily  . docusate sodium  100 mg Oral BID  . folic acid  1 mg Oral Daily  . furosemide  40 mg Intravenous Once  . meclizine  12.5 mg Oral TID  . sodium chloride  3 mL Intravenous Q12H    Assessment: 64 yo F presented to ED with dizziness.  Pt was on Xarelto PTA for hx afib, PE/DVT.  Pt is s/p lexiscan 9/21 and now plans for cardiac cath 9/23 so Xarelto was stopped.  Will bridge with IV heparin.  Last Xarelto dose 9/21 at 1720.  Goal of Therapy:  Heparin level 0.3-0.7 units/ml aPTT 66-102 seconds Monitor platelets by anticoagulation protocol: Yes   Plan:  Will order baseline aPTT/HL Start heparin infusion  9/22 at 1700 (24 hours after last Xarelto dose) Heparin gtt at 1200 units/hr - no bolus. APTT at midnight Heparin level, aPTT, and CBC daily  [employer], Pharm.D., BCPS Clinical Pharmacist Pager [phone] 09/26/2014 11:42 AM

## 2014-09-27 ENCOUNTER — Encounter (HOSPITAL_COMMUNITY): Payer: Self-pay | Admitting: Cardiovascular Disease

## 2014-09-27 ENCOUNTER — Encounter (HOSPITAL_COMMUNITY)
Admission: AD | Disposition: A | Discharge status: Home / Self Care | Payer: Self-pay | Source: Ambulatory Visit | Attending: Cardiovascular Disease

## 2014-09-27 DIAGNOSIS — Z794 Long term (current) use of insulin: Secondary | ICD-10-CM | POA: Diagnosis not present

## 2014-09-27 DIAGNOSIS — I272 Other secondary pulmonary hypertension: Secondary | ICD-10-CM | POA: Diagnosis present

## 2014-09-27 DIAGNOSIS — Z8673 Personal history of transient ischemic attack (TIA), and cerebral infarction without residual deficits: Secondary | ICD-10-CM | POA: Diagnosis not present

## 2014-09-27 DIAGNOSIS — I480 Paroxysmal atrial fibrillation: Secondary | ICD-10-CM | POA: Diagnosis present

## 2014-09-27 DIAGNOSIS — I10 Essential (primary) hypertension: Secondary | ICD-10-CM | POA: Diagnosis present

## 2014-09-27 DIAGNOSIS — I2582 Chronic total occlusion of coronary artery: Secondary | ICD-10-CM | POA: Diagnosis present

## 2014-09-27 DIAGNOSIS — E669 Obesity, unspecified: Secondary | ICD-10-CM | POA: Diagnosis present

## 2014-09-27 DIAGNOSIS — Z87891 Personal history of nicotine dependence: Secondary | ICD-10-CM | POA: Diagnosis not present

## 2014-09-27 DIAGNOSIS — I340 Nonrheumatic mitral (valve) insufficiency: Secondary | ICD-10-CM | POA: Diagnosis present

## 2014-09-27 DIAGNOSIS — Z7901 Long term (current) use of anticoagulants: Secondary | ICD-10-CM | POA: Diagnosis not present

## 2014-09-27 DIAGNOSIS — I5021 Acute systolic (congestive) heart failure: Secondary | ICD-10-CM | POA: Diagnosis present

## 2014-09-27 DIAGNOSIS — Z95828 Presence of other vascular implants and grafts: Secondary | ICD-10-CM | POA: Diagnosis not present

## 2014-09-27 DIAGNOSIS — Z95810 Presence of automatic (implantable) cardiac defibrillator: Secondary | ICD-10-CM | POA: Diagnosis not present

## 2014-09-27 DIAGNOSIS — I071 Rheumatic tricuspid insufficiency: Secondary | ICD-10-CM | POA: Diagnosis present

## 2014-09-27 DIAGNOSIS — E119 Type 2 diabetes mellitus without complications: Secondary | ICD-10-CM | POA: Diagnosis present

## 2014-09-27 DIAGNOSIS — I5023 Acute on chronic systolic (congestive) heart failure: Secondary | ICD-10-CM | POA: Diagnosis present

## 2014-09-27 DIAGNOSIS — Z86718 Personal history of other venous thrombosis and embolism: Secondary | ICD-10-CM | POA: Diagnosis not present

## 2014-09-27 DIAGNOSIS — I2510 Atherosclerotic heart disease of native coronary artery without angina pectoris: Secondary | ICD-10-CM | POA: Diagnosis present

## 2014-09-27 DIAGNOSIS — Z6832 Body mass index (BMI) 32.0-32.9, adult: Secondary | ICD-10-CM | POA: Diagnosis not present

## 2014-09-27 DIAGNOSIS — Z951 Presence of aortocoronary bypass graft: Secondary | ICD-10-CM | POA: Diagnosis not present

## 2014-09-27 DIAGNOSIS — R42 Dizziness and giddiness: Secondary | ICD-10-CM | POA: Diagnosis present

## 2014-09-27 DIAGNOSIS — Z888 Allergy status to other drugs, medicaments and biological substances status: Secondary | ICD-10-CM | POA: Diagnosis not present

## 2014-09-27 DIAGNOSIS — Z79899 Other long term (current) drug therapy: Secondary | ICD-10-CM | POA: Diagnosis not present

## 2014-09-27 DIAGNOSIS — J45909 Unspecified asthma, uncomplicated: Secondary | ICD-10-CM | POA: Diagnosis present

## 2014-09-27 DIAGNOSIS — I420 Dilated cardiomyopathy: Secondary | ICD-10-CM | POA: Diagnosis present

## 2014-09-27 HISTORY — PX: CARDIAC CATHETERIZATION: SHX172

## 2014-09-27 LAB — LIPID PANEL
CHOL/HDL RATIO: 3.6 ratio
Cholesterol: 241 mg/dL — ABNORMAL HIGH (ref 0–200)
HDL: 67 mg/dL (ref >40)
LDL CALC: 163 mg/dL — AB (ref 0–99)
Triglycerides: 53 mg/dL (ref <150)
VLDL: 11 mg/dL (ref 0–40)

## 2014-09-27 LAB — BASIC METABOLIC PANEL
Anion gap: 3 — ABNORMAL LOW (ref 5–15)
BUN: 15 mg/dL (ref 6–20)
CALCIUM: 10.3 mg/dL (ref 8.9–10.3)
CO2: 27 mmol/L (ref 22–32)
Chloride: 105 mmol/L (ref 101–111)
Creatinine, Ser: 0.97 mg/dL (ref 0.44–1.00)
GFR calc Af Amer: >60 mL/min (ref >60)
GLUCOSE: 113 mg/dL — AB (ref 65–99)
POTASSIUM: 4.3 mmol/L (ref 3.5–5.1)
Sodium: 135 mmol/L (ref 135–145)

## 2014-09-27 LAB — CBC
HEMATOCRIT: 40.9 % (ref 36.0–46.0)
Hemoglobin: 13.3 g/dL (ref 12.0–15.0)
MCH: 28.7 pg (ref 26.0–34.0)
MCHC: 32.5 g/dL (ref 30.0–36.0)
MCV: 88.1 fL (ref 78.0–100.0)
PLATELETS: 196 10*3/uL (ref 150–400)
RBC: 4.64 MIL/uL (ref 3.87–5.11)
RDW: 14.4 % (ref 11.5–15.5)
WBC: 3.4 10*3/uL — ABNORMAL LOW (ref 4.0–10.5)

## 2014-09-27 LAB — PROTIME-INR
INR: 1.09 (ref 0.00–1.49)
Prothrombin Time: 14.3 seconds (ref 11.6–15.2)

## 2014-09-27 LAB — GLUCOSE, CAPILLARY: Glucose-Capillary: 94 mg/dL (ref 65–99)

## 2014-09-27 LAB — HEPARIN LEVEL (UNFRACTIONATED): HEPARIN UNFRACTIONATED: 0.89 [IU]/mL — AB (ref 0.30–0.70)

## 2014-09-27 LAB — APTT
APTT: 96 s — AB (ref 24–37)
APTT: 103 s — AB (ref 24–37)

## 2014-09-27 SURGERY — LEFT HEART CATH AND CORONARY ANGIOGRAPHY

## 2014-09-27 MED ORDER — MIDAZOLAM HCL 2 MG/2ML IJ SOLN
INTRAMUSCULAR | Status: DC | PRN
  Administered 2014-09-27: 1 mg via INTRAVENOUS

## 2014-09-27 MED ORDER — SODIUM CHLORIDE 0.9 % IV SOLN: 250.0000 mL | INTRAVENOUS | Status: DC | PRN

## 2014-09-27 MED ORDER — IOHEXOL 350 MG/ML SOLN
INTRAVENOUS | Status: DC | PRN
  Administered 2014-09-27: 43 mL via INTRA_ARTERIAL

## 2014-09-27 MED ORDER — SODIUM CHLORIDE 0.9 % IJ SOLN
3.0000 mL | Freq: Two times a day (BID) | INTRAMUSCULAR | Status: DC
  Administered 2014-09-27 – 2014-09-28 (×2): 3 mL via INTRAVENOUS

## 2014-09-27 MED ORDER — HEPARIN (PORCINE) IN NACL 100-0.45 UNIT/ML-% IJ SOLN
1100.0000 [IU]/h | INTRAMUSCULAR | Status: DC
Start: 2014-09-27 — End: 2014-09-28
  Administered 2014-09-27: 1100 [IU]/h via INTRAVENOUS
  Filled 2014-09-27: qty 250

## 2014-09-27 MED ORDER — METOPROLOL TARTRATE 25 MG PO TABS
25.0000 mg | ORAL_TABLET | Freq: Two times a day (BID) | ORAL | Status: DC
  Administered 2014-09-27 – 2014-09-28 (×2): 25 mg via ORAL
  Filled 2014-09-27 (×2): qty 1

## 2014-09-27 MED ORDER — SODIUM CHLORIDE 0.9 % IJ SOLN: 3.0000 mL | INTRAMUSCULAR | Status: DC | PRN

## 2014-09-27 MED ORDER — FENTANYL CITRATE (PF) 100 MCG/2ML IJ SOLN
INTRAMUSCULAR | Status: DC | PRN
  Administered 2014-09-27: 25 ug via INTRAVENOUS

## 2014-09-27 MED ORDER — SODIUM CHLORIDE 0.9 % IJ SOLN
3.0000 mL | Freq: Two times a day (BID) | INTRAMUSCULAR | Status: DC
  Administered 2014-09-28: 3 mL via INTRAVENOUS

## 2014-09-27 MED ORDER — AMIODARONE HCL 200 MG PO TABS
200.0000 mg | ORAL_TABLET | Freq: Every day | ORAL | Status: DC
  Administered 2014-09-27 – 2014-09-28 (×2): 200 mg via ORAL
  Filled 2014-09-27 (×2): qty 1

## 2014-09-27 MED ORDER — FENTANYL CITRATE (PF) 100 MCG/2ML IJ SOLN
INTRAMUSCULAR | Status: AC
  Filled 2014-09-27: qty 4

## 2014-09-27 MED ORDER — HEPARIN (PORCINE) IN NACL 2-0.9 UNIT/ML-% IJ SOLN
INTRAMUSCULAR | Status: DC | PRN
  Administered 2014-09-27: 20 mL

## 2014-09-27 MED ORDER — LIDOCAINE HCL (PF) 1 % IJ SOLN
INTRAMUSCULAR | Status: AC
  Filled 2014-09-27: qty 30

## 2014-09-27 MED ORDER — MIDAZOLAM HCL 2 MG/2ML IJ SOLN
INTRAMUSCULAR | Status: AC
  Filled 2014-09-27: qty 4

## 2014-09-27 MED ORDER — HEPARIN (PORCINE) IN NACL 2-0.9 UNIT/ML-% IJ SOLN
INTRAMUSCULAR | Status: AC
  Filled 2014-09-27: qty 1000

## 2014-09-27 SURGICAL SUPPLY — 12 items
CATH INFINITI 5FR MULTPACK ANG (CATHETERS) ×2 IMPLANT
CATH SITESEER 5F NTR (CATHETERS) ×2 IMPLANT
CATH SWAN GANZ 7F STRAIGHT (CATHETERS) ×2 IMPLANT
KIT HEART LEFT (KITS) ×2 IMPLANT
PACK CARDIAC CATHETERIZATION (CUSTOM PROCEDURE TRAY) ×2 IMPLANT
SHEATH PINNACLE 5F 10CM (SHEATH) ×2 IMPLANT
SHEATH PINNACLE 7F 10CM (SHEATH) ×2 IMPLANT
SYR MEDRAD MARK V 150ML (SYRINGE) ×2 IMPLANT
TRANSDUCER W/STOPCOCK (MISCELLANEOUS) ×4 IMPLANT
TUBING ART PRESS 72  MALE/FEM (TUBING) ×1
TUBING ART PRESS 72 MALE/FEM (TUBING) ×1 IMPLANT
WIRE EMERALD 3MM-J .035X150CM (WIRE) ×2 IMPLANT

## 2014-09-27 NOTE — Progress Notes (Signed)
ANTICOAGULATION CONSULT NOTE - Initial Consult  Pharmacy Consult for Heparin infusion Indication: Afib, PE/DVT  Allergies  Allergen Reactions  . Clopidogrel Bisulfate Rash    Patient Measurements: Height: 5\' 7"  (170.2 cm) Weight: 207 lb 3.2 oz (93.985 kg) IBW/kg (Calculated) : 61.6 Heparin Dosing Weight: 82 kg  Vital Signs: Temp: 98.2 F (36.8 C) (09/23 1210) Temp Source: Oral (09/23 1210) BP: 112/58 mmHg (09/23 1210) Pulse Rate: 75 (09/23 1210)  Labs:  Recent Labs  09/24/14 1825 09/25/14 0018 09/25/14 0542 09/26/14 0628 09/26/14 1310 09/27/14 09/27/14 0631  HGB 12.6  --  12.1  --   --   --  13.3  HCT 39.0  --  38.2  --   --   --  40.9  PLT 202  --  178  --   --   --  196  APTT 31  --   --   --  25 96* 103*  LABPROT 15.6*  --   --   --   --   --  14.3  INR 1.23  --   --   --   --   --  1.09  HEPARINUNFRC  --   --   --   --  0.46  --  0.89*  CREATININE 0.82  --  0.93 0.97  --   --  0.97  TROPONINI <0.03 <0.03 <0.03  --   --   --   --     Estimated Creatinine Clearance: 69 mL/min (by C-G formula based on Cr of 0.97).   Medical History: Past Medical History  Diagnosis Date  . Atrial fibrillation   . CAD (coronary artery disease)   . Mitral valve regurgitation   . DM (diabetes mellitus)   . Hyperthyroidism   . Stroke 2001  . CHF (congestive heart failure)   . Dizziness   . Anxiety   . Obesity   . Pulmonary embolism   . DVT (deep venous thrombosis)     Medications:  Scheduled:  . digoxin  125 mcg Oral Daily  . docusate sodium  100 mg Oral BID  . folic acid  1 mg Oral Daily  . furosemide  40 mg Intravenous Once  . meclizine  12.5 mg Oral TID  . sodium chloride  3 mL Intravenous Q12H  . sodium chloride  3 mL Intravenous Q12H  . sodium chloride  3 mL Intravenous Q12H    Assessment: 64 yo F presented to ED with dizziness.  Pt was on Xarelto PTA for hx afib, PE/DVT.  Pt is s/p lexiscan 9/21 and now plans for cardiac cath 9/23 so Xarelto was stopped.   Will bridge with IV heparin.  Last Xarelto dose 9/21 at 1720. HL level this AM was therapeutic. She got cath this AM. Plan is to resume heparin 8 hr post sheath removal.   Goal of Therapy:  Heparin level 0.3-0.7 units/ml aPTT 66-102 seconds Monitor platelets by anticoagulation protocol: Yes   Plan:   Resume heparin at 1100 units/hr F/u with 6hr HL Daily HL, CBC Dc PTT  Onnie Boer, PharmD Pager: [phone] 09/27/2014 12:46 PM

## 2014-09-27 NOTE — Progress Notes (Signed)
Site area: RFA Site Prior to Removal:  Level 0 Pressure Applied For:76min Manual:  yes  Patient Status During Pull: stable  Post Pull Site:  Level 0 Post Pull Instructions Given:  yes Post Pull Pulses Present: palpable Dressing Applied: clear  Bedrest begins @1045  [id number]  Comments:

## 2014-09-27 NOTE — H&P (View-Only) (Signed)
Ref: Barbette Merino, MD   Subjective:  Feeling better with lasix use. ICD interrogation showed atrial fibrillation in August, No end of battery life.   Objective:  Vital Signs in the last 24 hours: Temp:  [97.6 F (36.4 C)-98.3 F (36.8 C)] 97.6 F (36.4 C) (09/22 1357) Pulse Rate:  [76-83] 83 (09/22 1357) Cardiac Rhythm:  [-] Normal sinus rhythm;Bundle branch block (09/22 0701) Resp:  [18-19] 19 (09/22 1357) BP: (110-119)/(60-62) 119/62 mmHg (09/22 1357) SpO2:  [92 %-97 %] 93 % (09/22 1357) Weight:  [92.761 kg (204 lb 8 oz)] 92.761 kg (204 lb 8 oz) (09/22 0557)  Physical Exam: BP Readings from Last 1 Encounters:  09/26/14 119/62    Wt Readings from Last 1 Encounters:  09/26/14 92.761 kg (204 lb 8 oz)    Weight change: -1.86 kg (-4 lb 1.6 oz)  HEENT: [place]/AT, Eyes-Brown, PERL, EOMI, Conjunctiva-Pink, Sclera-Non-icteric Neck: No JVD, No bruit, Trachea midline. Lungs:  Clear, Bilateral. Cardiac:  Regular rhythm, normal S1 and S2, no S3.  Abdomen:  Soft, non-tender. Extremities:  No edema present. No cyanosis. No clubbing. CNS: AxOx3, Cranial nerves grossly intact, moves all 4 extremities. Right handed. Skin: Warm and dry.   Intake/Output from previous day: 09/21 0701 - 09/22 0700 In: 0  Out: 4100 [Urine:4100]    Lab Results: BMET    Component Value Date/Time   NA 136 09/26/2014 0628   NA 140 09/25/2014 0542   NA 138 09/24/2014 1825   K 4.3 09/26/2014 0628   K 4.3 09/25/2014 0542   K 4.1 09/24/2014 1825   CL 102 09/26/2014 0628   CL 108 09/25/2014 0542   CL 103 09/24/2014 1825   CO2 28 09/26/2014 0628   CO2 28 09/25/2014 0542   CO2 28 09/24/2014 1825   GLUCOSE 106* 09/26/2014 0628   GLUCOSE 100* 09/25/2014 0542   GLUCOSE 79 09/24/2014 1825   BUN 13 09/26/2014 0628   BUN 12 09/25/2014 0542   BUN 14 09/24/2014 1825   CREATININE 0.97 09/26/2014 0628   CREATININE 0.93 09/25/2014 0542   CREATININE 0.82 09/24/2014 1825   CALCIUM 10.3 09/26/2014 0628   CALCIUM  10.1 09/25/2014 0542   CALCIUM 10.4* 09/24/2014 1825   GFRNONAA >60 09/26/2014 0628   GFRNONAA >60 09/25/2014 0542   GFRNONAA >60 09/24/2014 1825   GFRAA >60 09/26/2014 0628   GFRAA >60 09/25/2014 0542   GFRAA >60 09/24/2014 1825   CBC    Component Value Date/Time   WBC 3.1* 09/25/2014 0542   RBC 4.32 09/25/2014 0542   HGB 12.1 09/25/2014 0542   HCT 38.2 09/25/2014 0542   PLT 178 09/25/2014 0542   MCV 88.4 09/25/2014 0542   MCH 28.0 09/25/2014 0542   MCHC 31.7 09/25/2014 0542   RDW 14.4 09/25/2014 0542   LYMPHSABS 1.6 09/24/2014 1825   MONOABS 0.5 09/24/2014 1825   EOSABS 0.1 09/24/2014 1825   BASOSABS 0.0 09/24/2014 1825   HEPATIC Function Panel  Recent Labs  09/24/14 1825  PROT 8.2*   HEMOGLOBIN A1C No components found for: HGA1C,  MPG CARDIAC ENZYMES Lab Results  Component Value Date   TROPONINI <0.03 09/25/2014   TROPONINI <0.03 09/25/2014   TROPONINI <0.03 09/24/2014   BNP No results for input(s): PROBNP in the last 8760 hours. TSH No results for input(s): TSH in the last 8760 hours. CHOLESTEROL No results for input(s): CHOL in the last 8760 hours.  Scheduled Meds: . digoxin  125 mcg Oral Daily  . docusate sodium  100  mg Oral BID  . folic acid  1 mg Oral Daily  . furosemide  40 mg Intravenous Once  . meclizine  12.5 mg Oral TID  . sodium chloride  3 mL Intravenous Q12H   Continuous Infusions: . heparin 1,200 Units/hr (09/26/14 1655)   PRN Meds:.acetaminophen **OR** acetaminophen, alum & mag hydroxide-simeth, HYDROcodone-acetaminophen, ondansetron **OR** ondansetron (ZOFRAN) IV  Assessment/Plan: Dizziness Positional vertigo Hypertension DM, II Obesity Left leg pain Defibrillator implantation history Paroxysmal atrial fibrillation H/O DVT Dilated cardiomyopathy Moderate pulmonary hypertension Moderate MR and TR   Right and left heart cath in AM.     Dixie Dials  MD  09/26/2014, 7:27 PM

## 2014-09-27 NOTE — Progress Notes (Signed)
Patient ringing out Vtach on telemetry, with rate of 120-130. Cardiac strip reviewed. Rhythm appears to be irregular, resembling atrial fibrillation. Patient has no complaints at this time and is asymptomatic. Patient currently back in NSR at this time, rate 87.  Dr. Doylene Canard made aware, on unit reviewing strip at this time. Dr. Doylene Canard states rhythm not vtach. MD to order new medications due to rate. Will continue to monitor.

## 2014-09-27 NOTE — Progress Notes (Signed)
Patient has arrived to unit room 3E21. Patient alert and oriented x4 and appears to be in no distress at this time. Patient has gauze to right groin, site unremarkable. Patient to maintain bedrest until 1445. Patient oriented to room and unit. Call bell and room telephone in reach. Will continue to monitor.

## 2014-09-27 NOTE — Progress Notes (Signed)
Report received on patient from cath lab. Will be expecting patients arrival.

## 2014-09-27 NOTE — Interval H&P Note (Signed)
History and Physical Interval Note:  09/27/2014 9:11 AM  Shelley Douglas  has presented today for surgery, with the diagnosis of cm  The various methods of treatment have been discussed with the patient and family. After consideration of risks, benefits and other options for treatment, the patient has consented to  Procedure(s): Right/Left Heart Cath and Coronary Angiography (N/A) as a surgical intervention .  The patient's history has been reviewed, patient examined, no change in status, stable for surgery.  I have reviewed the patient's chart and labs.  Questions were answered to the patient's satisfaction.     KADAKIA,AJAY S

## 2014-09-27 NOTE — Progress Notes (Signed)
ANTICOAGULATION CONSULT NOTE - Follow Up Consult  Pharmacy Consult for heparin Indication: Afib and h/o PE/DVT   Labs:  Recent Labs  09/24/14 1825 09/25/14 0018 09/25/14 0542 09/26/14 0628 09/26/14 1310 09/27/14  HGB 12.6  --  12.1  --   --   --   HCT 39.0  --  38.2  --   --   --   PLT 202  --  178  --   --   --   APTT 31  --   --   --  25 96*  LABPROT 15.6*  --   --   --   --   --   INR 1.23  --   --   --   --   --   HEPARINUNFRC  --   --   --   --  0.46  --   CREATININE 0.82  --  0.93 0.97  --   --   TROPONINI <0.03 <0.03 <0.03  --   --   --      Assessment/Plan:  64yo female therapeutic on heparin with initial dosing while Xarelto on hold. Will continue gtt at current rate and confirm stable with am labs.   Wynona Neat, PharmD, BCPS  09/27/2014,1:16 AM

## 2014-09-28 LAB — CBC
HEMATOCRIT: 38.4 % (ref 36.0–46.0)
Hemoglobin: 12.4 g/dL (ref 12.0–15.0)
MCH: 28.3 pg (ref 26.0–34.0)
MCHC: 32.3 g/dL (ref 30.0–36.0)
MCV: 87.7 fL (ref 78.0–100.0)
PLATELETS: 170 10*3/uL (ref 150–400)
RBC: 4.38 MIL/uL (ref 3.87–5.11)
RDW: 14.3 % (ref 11.5–15.5)
WBC: 3.1 10*3/uL — AB (ref 4.0–10.5)

## 2014-09-28 LAB — HEPARIN LEVEL (UNFRACTIONATED)
Heparin Unfractionated: 0.39 IU/mL (ref 0.30–0.70)
Heparin Unfractionated: 0.44 IU/mL (ref 0.30–0.70)

## 2014-09-28 LAB — BASIC METABOLIC PANEL
ANION GAP: 5 (ref 5–15)
BUN: 9 mg/dL (ref 6–20)
CO2: 27 mmol/L (ref 22–32)
Calcium: 10.1 mg/dL (ref 8.9–10.3)
Chloride: 105 mmol/L (ref 101–111)
Creatinine, Ser: 0.81 mg/dL (ref 0.44–1.00)
GFR calc Af Amer: >60 mL/min (ref >60)
GLUCOSE: 103 mg/dL — AB (ref 65–99)
POTASSIUM: 4.1 mmol/L (ref 3.5–5.1)
Sodium: 137 mmol/L (ref 135–145)

## 2014-09-28 LAB — APTT: APTT: 70 s — AB (ref 24–37)

## 2014-09-28 MED ORDER — RIVAROXABAN 20 MG PO TABS
20.0000 mg | ORAL_TABLET | Freq: Every day | ORAL | Status: DC
  Administered 2014-09-28: 20 mg via ORAL
  Filled 2014-09-28 (×2): qty 1

## 2014-09-28 MED ORDER — AMIODARONE HCL 200 MG PO TABS: 200.0000 mg | ORAL_TABLET | Freq: Every day | ORAL | Status: DC

## 2014-09-28 NOTE — Discharge Summary (Signed)
Physician Discharge Summary  Patient ID: Shelley Douglas MRN: [id number] DOB/AGE: 64-Oct-1952 64 y.o.  Admit date: 09/24/2014 Discharge date: 09/28/2014  Admission Diagnoses: Dizziness Positional vertigo Hypertension DM, II Obesity Left leg pain Defibrillator implantation history Paroxysmal atrial fibrillation H/O DVT  Discharge Diagnoses:  Principal Problem: * Acute on chronic left heart systolic failure * Active Problems: Dizziness Positional vertigo Hypertension DM, II Obesity Left leg pain Defibrillator implantation history Paroxysmal atrial fibrillation H/O DVT Dilated cardiomyopathy Moderate pulmonary hypertension Moderate MR and TR  Discharged Condition: fair  Hospital Course: Patient came for dizziness and not feeling well. After investigations she has acute on chronic systolic heart failure. Her dizziness improved with meclizine use but not feeling well feeling improved with IV lasix and good diuresis. Her cardiac catheterization showed chronic total occlusion of RCA with no viability by nuclear stress test. Her EF was down to 20-25 %. Her ICD interrogation revealed episode of atrial fibrillation with ventricular rate of 134/min. Amiodarone was started and low dose B-blocker and lanoxin were given. 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 She was discharged home with f/u in 1 week by me and 1 month by primary care doctor.   Consults: cardiology  Significant Diagnostic Studies: labs: CBC unremarkable except mild leukopenia. CMET near normal with mildly elevated total protein and calcium level. Troponin I was normal x 3.  Treatments: cardiac meds: carvedilol, digoxin, amiodarone and Simvastatin + Xarelto.  Discharge Exam: Blood pressure 107/59, pulse 72, temperature 98 F (36.7 C), temperature source Oral, resp. rate 18, height 5\' 7"  (1.702 m), weight 92.851 kg (204 lb 11.2 oz), SpO2 98 %. HEENT: [place]/AT, Eyes-Brown, PERL, EOMI, Conjunctiva-Pink,  Sclera-Non-icteric Neck: No JVD, No bruit, Trachea midline. Lungs: Clear, Bilateral. Cardiac: Regular rhythm, normal S1 and S2, no S3. III/VI systolic murmur. Abdomen: Soft, non-tender. Extremities: No edema present. No cyanosis. No clubbing. CNS: AxOx3, Cranial nerves grossly intact, moves all 4 extremities. Right handed. Skin: Warm and dry.  Disposition: 01-Home or Self Care     Medication List    STOP taking these medications        HYDROcodone-acetaminophen 5-325 MG per tablet  Commonly known as:  NORCO/VICODIN     methocarbamol 500 MG tablet  Commonly known as:  ROBAXIN     oxyCODONE-acetaminophen 5-325 MG per tablet  Commonly known as:  PERCOCET/ROXICET      TAKE these medications        amiodarone 200 MG tablet  Commonly known as:  PACERONE  Take 1 tablet (200 mg total) by mouth daily.     carvedilol 6.25 MG tablet  Commonly known as:  COREG  Take 6.25 mg by mouth 2 (two) times daily.     digoxin 0.125 MG tablet  Commonly known as:  LANOXIN  Take 125 mcg by mouth daily.     folic acid 1 MG tablet  Commonly known as:  FOLVITE  Take 1 mg by mouth daily.     furosemide 40 MG tablet  Commonly known as:  LASIX  TAKE ONE TABLET BY MOUTH EVERY DAY     losartan 50 MG tablet  Commonly known as:  COZAAR  Take 50 mg by mouth daily.     simvastatin 20 MG tablet  Commonly known as:  ZOCOR  Take 20 mg by mouth daily.     XARELTO 20 MG Tabs tablet  Generic drug:  rivaroxaban  Take 20 mg by mouth at bedtime.           Follow-up  Information    Follow up with GARBA,LAWAL, MD. Schedule an appointment as soon as possible for a visit in 1 month.   Specialty:  Internal Medicine   Contact information:   [address] [place] [address] [phone]       Follow up with [facility], MD. Schedule an appointment as soon as possible for a visit in 1 week.   Specialty:  Cardiology   Contact information:   [place]  [address] [phone]       Signed: Birdie Riddle 09/28/2014, 12:14 PM

## 2014-09-28 NOTE — Discharge Summary (Signed)
Pt got discharged, discharge instructions provided and patient showed understanding to it, following up with PCP in 1 week, left the hospital with all of her belongings accompanied with her husband.

## 2014-09-28 NOTE — Progress Notes (Signed)
ANTICOAGULATION CONSULT NOTE - Follow-up Consult  Pharmacy Consult for Heparin Indication: Afib, PE/DVT  Allergies  Allergen Reactions  . Clopidogrel Bisulfate Rash    Patient Measurements: Height: 5\' 7"  (170.2 cm) Weight: 207 lb 3.2 oz (93.985 kg) IBW/kg (Calculated) : 61.6 Heparin Dosing Weight: 82 kg  Vital Signs: Temp: 98.1 F (36.7 C) (09/24 0054) Temp Source: Oral (09/24 0054) BP: 123/59 mmHg (09/24 0054) Pulse Rate: 70 (09/24 0054)  Labs:  Recent Labs  09/25/14 0542 09/26/14 0628  09/26/14 1310 09/27/14 09/27/14 0631 09/28/14 0032  HGB 12.1  --   --   --   --  13.3 12.4  HCT 38.2  --   --   --   --  40.9 38.4  PLT 178  --   --   --   --  196 170  APTT  --   --   < > 25 96* 103* 70*  LABPROT  --   --   --   --   --  14.3  --   INR  --   --   --   --   --  1.09  --   HEPARINUNFRC  --   --   --  0.46  --  0.89* 0.39  CREATININE 0.93 0.97  --   --   --  0.97  --   TROPONINI <0.03  --   --   --   --   --   --   < > = values in this interval not displayed.  Estimated Creatinine Clearance: 69 mL/min (by C-G formula based on Cr of 0.97).  Assessment: 64 yo F on Xarelto PTA for hx afib, PE/DVT.  S/p cath 9/23 and heparin bridge restarted 8 hr post sheath removal. Heparin level 0.39 and aPTT 70 sec (appears that Xarelto affect on heparin level has worn off). No bleeding noted.  Goal of Therapy:  Heparin level 0.3-0.7 units/ml Monitor platelets by anticoagulation protocol: Yes   Plan:  Continue heparin at 1100 units/hr Daily HL, CBC  Sherlon Handing, PharmD, BCPS Clinical pharmacist, pager [phone] 09/28/2014 1:07 AM

## 2014-10-01 LAB — POCT ACTIVATED CLOTTING TIME: Activated Clotting Time: 97 seconds

## 2014-10-03 ENCOUNTER — Other Ambulatory Visit: Payer: Self-pay

## 2014-10-03 DIAGNOSIS — Z1231 Encounter for screening mammogram for malignant neoplasm of breast: Secondary | ICD-10-CM

## 2014-11-04 ENCOUNTER — Ambulatory Visit
Admission: RE | Admit: 2014-11-04 | Discharge: 2014-11-04 | Disposition: A | Discharge status: Home / Self Care | Payer: Medicare HMO | Source: Ambulatory Visit

## 2014-11-04 DIAGNOSIS — Z1231 Encounter for screening mammogram for malignant neoplasm of breast: Secondary | ICD-10-CM

## 2014-12-06 ENCOUNTER — Observation Stay (HOSPITAL_COMMUNITY)
Admission: EM | Admit: 2014-12-06 | Discharge: 2014-12-08 | Disposition: A | Discharge status: Home / Self Care | Payer: Medicare HMO | Attending: Cardiovascular Disease | Admitting: Cardiovascular Disease

## 2014-12-06 ENCOUNTER — Encounter (HOSPITAL_COMMUNITY): Payer: Self-pay | Admitting: Emergency Medicine

## 2014-12-06 ENCOUNTER — Emergency Department (HOSPITAL_COMMUNITY): Payer: Medicare HMO

## 2014-12-06 DIAGNOSIS — I2510 Atherosclerotic heart disease of native coronary artery without angina pectoris: Secondary | ICD-10-CM | POA: Insufficient documentation

## 2014-12-06 DIAGNOSIS — I2582 Chronic total occlusion of coronary artery: Secondary | ICD-10-CM | POA: Diagnosis not present

## 2014-12-06 DIAGNOSIS — I252 Old myocardial infarction: Secondary | ICD-10-CM | POA: Insufficient documentation

## 2014-12-06 DIAGNOSIS — I200 Unstable angina: Secondary | ICD-10-CM

## 2014-12-06 DIAGNOSIS — Z86718 Personal history of other venous thrombosis and embolism: Secondary | ICD-10-CM | POA: Diagnosis not present

## 2014-12-06 DIAGNOSIS — I420 Dilated cardiomyopathy: Secondary | ICD-10-CM | POA: Diagnosis not present

## 2014-12-06 DIAGNOSIS — Z7901 Long term (current) use of anticoagulants: Secondary | ICD-10-CM | POA: Insufficient documentation

## 2014-12-06 DIAGNOSIS — M79605 Pain in left leg: Secondary | ICD-10-CM | POA: Insufficient documentation

## 2014-12-06 DIAGNOSIS — Z6832 Body mass index (BMI) 32.0-32.9, adult: Secondary | ICD-10-CM | POA: Insufficient documentation

## 2014-12-06 DIAGNOSIS — Z86711 Personal history of pulmonary embolism: Secondary | ICD-10-CM | POA: Diagnosis not present

## 2014-12-06 DIAGNOSIS — R0602 Shortness of breath: Secondary | ICD-10-CM | POA: Diagnosis present

## 2014-12-06 DIAGNOSIS — I5023 Acute on chronic systolic (congestive) heart failure: Secondary | ICD-10-CM | POA: Diagnosis not present

## 2014-12-06 DIAGNOSIS — Z95810 Presence of automatic (implantable) cardiac defibrillator: Secondary | ICD-10-CM | POA: Diagnosis not present

## 2014-12-06 DIAGNOSIS — I480 Paroxysmal atrial fibrillation: Secondary | ICD-10-CM | POA: Diagnosis not present

## 2014-12-06 DIAGNOSIS — Z79899 Other long term (current) drug therapy: Secondary | ICD-10-CM | POA: Insufficient documentation

## 2014-12-06 DIAGNOSIS — Z951 Presence of aortocoronary bypass graft: Secondary | ICD-10-CM | POA: Insufficient documentation

## 2014-12-06 DIAGNOSIS — I249 Acute ischemic heart disease, unspecified: Secondary | ICD-10-CM | POA: Diagnosis present

## 2014-12-06 DIAGNOSIS — E119 Type 2 diabetes mellitus without complications: Secondary | ICD-10-CM | POA: Insufficient documentation

## 2014-12-06 DIAGNOSIS — I110 Hypertensive heart disease with heart failure: Principal | ICD-10-CM | POA: Insufficient documentation

## 2014-12-06 DIAGNOSIS — Z955 Presence of coronary angioplasty implant and graft: Secondary | ICD-10-CM | POA: Diagnosis not present

## 2014-12-06 DIAGNOSIS — E669 Obesity, unspecified: Secondary | ICD-10-CM | POA: Insufficient documentation

## 2014-12-06 DIAGNOSIS — Z8673 Personal history of transient ischemic attack (TIA), and cerebral infarction without residual deficits: Secondary | ICD-10-CM | POA: Diagnosis not present

## 2014-12-06 DIAGNOSIS — Z87891 Personal history of nicotine dependence: Secondary | ICD-10-CM | POA: Insufficient documentation

## 2014-12-06 LAB — CBC
HCT: 39 % (ref 36.0–46.0)
Hemoglobin: 12.3 g/dL (ref 12.0–15.0)
MCH: 28.1 pg (ref 26.0–34.0)
MCHC: 31.5 g/dL (ref 30.0–36.0)
MCV: 89.2 fL (ref 78.0–100.0)
PLATELETS: 185 10*3/uL (ref 150–400)
RBC: 4.37 MIL/uL (ref 3.87–5.11)
RDW: 15.1 % (ref 11.5–15.5)
WBC: 3.6 10*3/uL — ABNORMAL LOW (ref 4.0–10.5)

## 2014-12-06 LAB — DIGOXIN LEVEL: Digoxin Level: <0.2 ng/mL — ABNORMAL LOW (ref 0.8–2.0)

## 2014-12-06 LAB — BASIC METABOLIC PANEL
Anion gap: 6 (ref 5–15)
BUN: 12 mg/dL (ref 6–20)
CALCIUM: 10.1 mg/dL (ref 8.9–10.3)
CO2: 26 mmol/L (ref 22–32)
CREATININE: 0.85 mg/dL (ref 0.44–1.00)
Chloride: 107 mmol/L (ref 101–111)
GFR calc non Af Amer: >60 mL/min (ref >60)
Glucose, Bld: 102 mg/dL — ABNORMAL HIGH (ref 65–99)
Potassium: 4 mmol/L (ref 3.5–5.1)
SODIUM: 139 mmol/L (ref 135–145)

## 2014-12-06 LAB — PROTIME-INR
INR: 1.07 (ref 0.00–1.49)
Prothrombin Time: 14.1 seconds (ref 11.6–15.2)

## 2014-12-06 LAB — I-STAT TROPONIN, ED: TROPONIN I, POC: 0.03 ng/mL (ref 0.00–0.08)

## 2014-12-06 MED ORDER — FUROSEMIDE 10 MG/ML IJ SOLN
40.0000 mg | INTRAMUSCULAR | Status: AC
  Administered 2014-12-06: 40 mg via INTRAVENOUS
  Filled 2014-12-06: qty 4

## 2014-12-06 NOTE — ED Notes (Signed)
Dr Miller at bedside. 

## 2014-12-06 NOTE — ED Notes (Signed)
Pt. Rreports SOB with left hand numbness onset yesterday , denies cough or congestion / no chest pain , history of CAD / PE / CABG - her cardiologist is Dr. Doylene Canard.

## 2014-12-06 NOTE — ED Provider Notes (Signed)
CSN: [id number]     Arrival date & time 12/06/14  2100 History  By signing my name below, I, Hansel Feinstein, attest that this documentation has been prepared under the direction and in the presence of Noemi Chapel, MD. Electronically Signed: Hansel Feinstein, ED Scribe. 12/06/2014. 11:15 PM.    Chief Complaint  Patient presents with  . Shortness of Breath  . Numbness   The history is provided by the patient. No language interpreter was used.    HPI Comments: Shelley Douglas is a 64 y.o. female with h/o Afib, CAD, DM, stroke, CHF, DVT/PE, MI in 2001, defibrillator, CABG who presents to the Emergency Department complaining of moderate, exertional, left-sided chest tightness and pressure onset yesterday with associated SOB, left arm and shoulder pain, numbness in the tips of the left fingers, dysarthria, leg swelling (baseline). She denies any other numbness in her hand or arm. Pt notes also notes subjective fluid weight gain in her stomach, but has not weighed herself. Pt notes she has intermittent SOB since her last CABG, but reports her SOB is more exertional recently. She states she has not felt her defibrillator fire recently. Pt and husband attribute the slurred speech to taking out her dentures, which she has had for 3 months. Cardiologist is Dr. Caryl Comes.  Pt states she is taking all medications as prescribed. She denies focal weakness, difficulty ambulating. Reviewed medical records at beside including echo, cardiac cath and prior EKG. On last cardiac [place] 2 months prior, ejection fracture was 20-25%. Most recent heart catheterization was on 09/27/14.Pt was admitted in 09/2014, and dx with complete obstruction of the right coronary artery, termed chronic total occulusion of the RCA. Nuclear stress test revealed no viability. It was at that time they started Amiodarone.   Past Medical History  Diagnosis Date  . Atrial fibrillation ([place])   . CAD (coronary artery disease)   . Mitral valve regurgitation   .  DM (diabetes mellitus) ([place])   . Hyperthyroidism   . Stroke ([place]) 2001  . CHF (congestive heart failure) ([place])   . Dizziness   . Anxiety   . Obesity   . Pulmonary embolism ([place])   . DVT (deep venous thrombosis) [facility])    Past Surgical History  Procedure Laterality Date  . Coronary angioplasty with stent placement    . Coronary artery bypass graft    . Cardiac defibrillator placement      [employer]tronic  . Vena cava filter placement    . Cardiac catheterization N/A 09/27/2014    Procedure: Left Heart Cath and Coronary Angiography;  Surgeon: Dixie Dials, MD;  Location: [facility];  Service: Cardiovascular;  Laterality: N/A;   No family history on file. Social History  Substance Use Topics  . Smoking status: Former Smoker    Quit date: 01/05/1988  . Smokeless tobacco: None  . Alcohol Use: No   OB History    Gravida Para Term Preterm AB TAB SAB Ectopic Multiple Living   4 4 4  0 0 0 0 0 0 4     Review of Systems  Constitutional: Positive for unexpected weight change ( subjective).  Respiratory: Positive for chest tightness and shortness of breath.   Cardiovascular: Positive for leg swelling ( baseline).  Musculoskeletal: Positive for arthralgias.  Neurological: Positive for speech difficulty and numbness. Negative for weakness.  All other systems reviewed and are negative.  Allergies  Clopidogrel bisulfate  Home Medications   Prior to Admission medications   Medication Sig Start  Date End Date Taking? Authorizing Provider  carvedilol (COREG) 6.25 MG tablet Take 6.25 mg by mouth 2 (two) times daily. 07/27/14  Yes Historical Provider, MD  digoxin (LANOXIN) 0.125 MG tablet Take 125 mcg by mouth daily.    Yes Historical Provider, MD  folic acid (FOLVITE) 1 MG tablet Take 1 mg by mouth daily.    Yes Historical Provider, MD  furosemide (LASIX) 40 MG tablet TAKE ONE TABLET BY MOUTH EVERY DAY 06/03/12  Yes Deboraha Sprang, MD  simvastatin (ZOCOR) 20 MG tablet Take 20 mg by mouth  daily.  08/30/14  Yes Historical Provider, MD  XARELTO 20 MG TABS tablet Take 20 mg by mouth at bedtime. 08/07/14  Yes Historical Provider, MD  amiodarone (PACERONE) 200 MG tablet Take 1 tablet (200 mg total) by mouth daily. Patient not taking: Reported on 12/06/2014 09/28/14   Dixie Dials, MD   BP 124/74 mmHg  Pulse 82  Temp(Src) 97.4 F (36.3 C) (Oral)  Resp 18  SpO2 95% Physical Exam  Constitutional: She appears well-developed and well-nourished. No distress.  HENT:  Head: Normocephalic and atraumatic.  Mouth/Throat: Oropharynx is clear and moist. No oropharyngeal exudate.  Eyes: Conjunctivae and EOM are normal. Pupils are equal, round, and reactive to light. Right eye exhibits no discharge. Left eye exhibits no discharge. No scleral icterus.  Neck: Normal range of motion. Neck supple. No JVD present. No thyromegaly present.  Cardiovascular: Normal rate, regular rhythm, normal heart sounds and intact distal pulses.  Exam reveals no gallop and no friction rub.   No murmur heard. Pulmonary/Chest: Effort normal. No respiratory distress. She has no wheezes. She has rales.  Soft rales at the bases symmetrically   Abdominal: Soft. Bowel sounds are normal. She exhibits no distension and no mass. There is no tenderness.  Musculoskeletal: Normal range of motion. She exhibits edema. She exhibits no tenderness.  1+ pitting edema symmetrical below the knees  Lymphadenopathy:    She has no cervical adenopathy.  Neurological: She is alert. Coordination normal.  Skin: Skin is warm and dry. No rash noted. No erythema.  Psychiatric: She has a normal mood and affect. Her behavior is normal.  Nursing note and vitals reviewed.  ED Course  Procedures (including critical care time) DIAGNOSTIC STUDIES: Oxygen Saturation is 95% on RA, adequate by my interpretation.    COORDINATION OF CARE: 11:13 PM Discussed treatment plan with pt at bedside and pt agreed to plan.   11:46 PM Spoke with Dr. Doylene Canard,  who will evaluate and admit the pt.     Labs Review Labs Reviewed  BASIC METABOLIC PANEL - Abnormal; Notable for the following:    Glucose, Bld 102 (*)    All other components within normal limits  CBC - Abnormal; Notable for the following:    WBC 3.6 (*)    All other components within normal limits  PROTIME-INR  DIGOXIN LEVEL  I-STAT TROPOININ, ED    Imaging Review Dg Chest 2 View  12/06/2014  CLINICAL DATA:  64 year old female with shortness of breath EXAM: CHEST  2 VIEW COMPARISON:  Chest radiograph dated 09/24/2014 FINDINGS: Two views of the chest demonstrate a small right-sided pleural effusion, increased in size compared the prior study. There is associated partial compressive atelectasis of the right lung base. There is stable cardiomegaly. Left pectoral AICD device noted. The degenerative changes of the spine. Median sternotomy wires and CABG vascular clips noted. IMPRESSION: Stable cardiomegaly with interval increase in the size of the right pleural effusion.  Electronically Signed   By: Anner Crete M.D.   On: 12/06/2014 21:35   I have personally reviewed and evaluated these images and lab results as part of my medical decision-making.   EKG Interpretation   Date/Time:  Friday December 06 2014 21:05:33 EST Ventricular Rate:  87 PR Interval:  162 QRS Duration: 136 QT Interval:  392 QTC Calculation: 471 R Axis:   84 Text Interpretation:  Sinus rhythm with Premature supraventricular  complexes Non-specific intra-ventricular conduction block Nonspecific T  wave abnormality Abnormal ECG Since last tracing ectopy present Confirmed  by Jaydynn Wolford  MD, Matalynn Graff ([id number]) on 12/06/2014 11:07:35 PM      MDM   Final diagnoses:  Unstable angina (HCC)    The pt has ongoing CP and SOB High risk pt given coronary hx and CHF Has increased effusion on the xray, Trop and ECG without significant findings D/w Dr. Doylene Canard who will come to see and admit.  Meds given in  ED:  Medications  furosemide (LASIX) injection 40 mg (not administered)    New Prescriptions   No medications on file   I personally performed the services described in this documentation, which was scribed in my presence. The recorded information has been reviewed and is accurate.         Noemi Chapel, MD 12/06/14 [phone]

## 2014-12-06 NOTE — ED Notes (Signed)
Pt back from CT

## 2014-12-06 NOTE — ED Notes (Signed)
Pt to CT

## 2014-12-07 DIAGNOSIS — I249 Acute ischemic heart disease, unspecified: Secondary | ICD-10-CM | POA: Diagnosis present

## 2014-12-07 LAB — CBC
HCT: 37.5 % (ref 36.0–46.0)
Hemoglobin: 11.8 g/dL — ABNORMAL LOW (ref 12.0–15.0)
MCH: 28.2 pg (ref 26.0–34.0)
MCHC: 31.5 g/dL (ref 30.0–36.0)
MCV: 89.5 fL (ref 78.0–100.0)
PLATELETS: 182 10*3/uL (ref 150–400)
RBC: 4.19 MIL/uL (ref 3.87–5.11)
RDW: 15.2 % (ref 11.5–15.5)
WBC: 3.5 10*3/uL — ABNORMAL LOW (ref 4.0–10.5)

## 2014-12-07 LAB — BASIC METABOLIC PANEL
Anion gap: 8 (ref 5–15)
BUN: 12 mg/dL (ref 6–20)
CHLORIDE: 104 mmol/L (ref 101–111)
CO2: 27 mmol/L (ref 22–32)
CREATININE: 0.96 mg/dL (ref 0.44–1.00)
Calcium: 9.8 mg/dL (ref 8.9–10.3)
GFR calc non Af Amer: >60 mL/min (ref >60)
GLUCOSE: 163 mg/dL — AB (ref 65–99)
Potassium: 3.5 mmol/L (ref 3.5–5.1)
Sodium: 139 mmol/L (ref 135–145)

## 2014-12-07 LAB — LIPID PANEL
CHOL/HDL RATIO: 3.9 ratio
CHOLESTEROL: 216 mg/dL — AB (ref 0–200)
HDL: 55 mg/dL (ref >40)
LDL Cholesterol: 142 mg/dL — ABNORMAL HIGH (ref 0–99)
TRIGLYCERIDES: 97 mg/dL (ref <150)
VLDL: 19 mg/dL (ref 0–40)

## 2014-12-07 LAB — PROTIME-INR
INR: 1.18 (ref 0.00–1.49)
PROTHROMBIN TIME: 15.2 s (ref 11.6–15.2)

## 2014-12-07 LAB — TROPONIN I

## 2014-12-07 LAB — BRAIN NATRIURETIC PEPTIDE: B NATRIURETIC PEPTIDE 5: 778.5 pg/mL — AB (ref 0.0–100.0)

## 2014-12-07 MED ORDER — ONDANSETRON HCL 4 MG/2ML IJ SOLN: 4.0000 mg | Freq: Four times a day (QID) | INTRAMUSCULAR | Status: DC | PRN

## 2014-12-07 MED ORDER — ACETAMINOPHEN 325 MG PO TABS: 650.0000 mg | ORAL_TABLET | ORAL | Status: DC | PRN

## 2014-12-07 MED ORDER — FUROSEMIDE 40 MG PO TABS
40.0000 mg | ORAL_TABLET | Freq: Every day | ORAL | Status: DC
  Administered 2014-12-07 – 2014-12-08 (×2): 40 mg via ORAL
  Filled 2014-12-07 (×2): qty 1

## 2014-12-07 MED ORDER — ASPIRIN 300 MG RE SUPP
300.0000 mg | RECTAL | Status: AC
  Filled 2014-12-07: qty 1

## 2014-12-07 MED ORDER — CARVEDILOL 6.25 MG PO TABS
6.2500 mg | ORAL_TABLET | Freq: Two times a day (BID) | ORAL | Status: DC
  Administered 2014-12-07 – 2014-12-08 (×3): 6.25 mg via ORAL
  Filled 2014-12-07 (×3): qty 1

## 2014-12-07 MED ORDER — FOLIC ACID 1 MG PO TABS
1.0000 mg | ORAL_TABLET | Freq: Every day | ORAL | Status: DC
  Administered 2014-12-07 – 2014-12-08 (×2): 1 mg via ORAL
  Filled 2014-12-07 (×2): qty 1

## 2014-12-07 MED ORDER — RIVAROXABAN 20 MG PO TABS
20.0000 mg | ORAL_TABLET | Freq: Every day | ORAL | Status: DC
  Administered 2014-12-07 (×2): 20 mg via ORAL
  Filled 2014-12-07 (×2): qty 1

## 2014-12-07 MED ORDER — SIMVASTATIN 20 MG PO TABS
20.0000 mg | ORAL_TABLET | Freq: Every day | ORAL | Status: DC
  Administered 2014-12-07: 20 mg via ORAL
  Filled 2014-12-07: qty 1

## 2014-12-07 MED ORDER — ASPIRIN 81 MG PO CHEW
324.0000 mg | CHEWABLE_TABLET | ORAL | Status: AC
  Filled 2014-12-07: qty 4

## 2014-12-07 MED ORDER — NITROGLYCERIN 0.4 MG SL SUBL: 0.4000 mg | SUBLINGUAL_TABLET | SUBLINGUAL | Status: DC | PRN

## 2014-12-07 MED ORDER — ASPIRIN EC 81 MG PO TBEC
81.0000 mg | DELAYED_RELEASE_TABLET | Freq: Every day | ORAL | Status: DC
  Administered 2014-12-08: 81 mg via ORAL
  Filled 2014-12-07 (×2): qty 1

## 2014-12-07 MED ORDER — DIGOXIN 125 MCG PO TABS
125.0000 ug | ORAL_TABLET | Freq: Every day | ORAL | Status: DC
  Administered 2014-12-07 – 2014-12-08 (×2): 125 ug via ORAL
  Filled 2014-12-07 (×2): qty 1

## 2014-12-07 NOTE — H&P (Signed)
Referring Physician:  [name] is an 64 y.o. female.                       Chief Complaint: Shortness of breath  HPI: 64 y.o. female with h/o Afib, CAD, DM, stroke, CHF, DVT/PE, MI in 2001, defibrillator, CABG who presents to the Emergency Department complaining of moderate, exertional, left-sided chest tightness and pressure onset yesterday with associated SOB, left arm and shoulder pain, numbness in the tips of the left fingers.Pt states she is taking all medications as prescribed. She denies focal weakness, difficulty ambulating. Reviewed medical records at beside including echo, cardiac cath and prior EKG. On last cardiac [place] 2 months prior, ejection fracture was 20-25%. Most recent heart catheterization was on 09/27/14.Pt was admitted in 09/2014, and dx with complete obstruction of the right coronary artery, termed chronic total occulusion of the RCA. Nuclear stress test revealed no viability. It was at that time they started Amiodarone.    Past Medical History  Diagnosis Date  . Atrial fibrillation (HCC)   . CAD (coronary artery disease)   . Mitral valve regurgitation   . DM (diabetes mellitus) (HCC)   . Hyperthyroidism   . Stroke (HCC) 2001  . CHF (congestive heart failure) (HCC)   . Dizziness   . Anxiety   . Obesity   . Pulmonary embolism (HCC)   . DVT (deep venous thrombosis) [facility])       Past Surgical History  Procedure Laterality Date  . Coronary angioplasty with stent placement    . Coronary artery bypass graft    . Cardiac defibrillator placement      [employer]tr[employer]  . Vena cava filter placement    . Cardiac catheterization N/A 09/27/2014    Procedure: Left Heart Cath and Coronary Angiography;  Surgeon: Orpah Cobb, MD;  Location: [facility];  Service: Cardiovascular;  Laterality: N/A;    No family history on file. Social History:  reports that she quit smoking about 26 years ago. She does not have any smokeless tobacco history on file. She reports that she  does not drink alcohol or use illicit drugs.  Allergies:  Allergies  Allergen Reactions  . Clopidogrel Bisulfate Rash     (Not in a hospital admission)  Results for orders placed or performed during the hospital encounter of 12/06/14 (from the past 48 hour(s))  Digoxin level     Status: Abnormal   Collection Time: 12/06/14  9:20 PM  Result Value Ref Range   Digoxin Level <0.2 (L) 0.8 - 2.0 ng/mL    Comment: RESULTS CONFIRMED BY MANUAL DILUTION  I-stat troponin, ED (not at [facility], [facility])     Status: None   Collection Time: 12/06/14  9:32 PM  Result Value Ref Range   Troponin i, poc 0.03 0.00 - 0.08 ng/mL   Comment 3            Comment: Due to the release kinetics of cTnI, a negative result within the first hours of the onset of symptoms does not rule out myocardial infarction with certainty. If myocardial infarction is still suspected, repeat the test at appropriate intervals.   Basic metabolic panel     Status: Abnormal   Collection Time: 12/06/14  9:46 PM  Result Value Ref Range   Sodium 139 135 - 145 mmol/L   Potassium 4.0 3.5 - 5.1 mmol/L   Chloride 107 101 - 111 mmol/L   CO2 26 22 - 32 mmol/L  Glucose, Bld 102 (H) 65 - 99 mg/dL   BUN 12 6 - 20 mg/dL   Creatinine, Ser 0.85 0.44 - 1.00 mg/dL   Calcium 10.1 8.9 - 10.3 mg/dL   GFR calc non Af Amer >60 >60 mL/min   GFR calc Af Amer >60 >60 mL/min    Comment: (NOTE) The eGFR has been calculated using the CKD EPI equation. This calculation has not been validated in all clinical situations. eGFR's persistently <60 mL/min signify possible Chronic Kidney Disease.    Anion gap 6 5 - 15  CBC     Status: Abnormal   Collection Time: 12/06/14  9:46 PM  Result Value Ref Range   WBC 3.6 (L) 4.0 - 10.5 K/uL   RBC 4.37 3.87 - 5.11 MIL/uL   Hemoglobin 12.3 12.0 - 15.0 g/dL   HCT 39.0 36.0 - 46.0 %   MCV 89.2 78.0 - 100.0 fL   MCH 28.1 26.0 - 34.0 pg   MCHC 31.5 30.0 - 36.0 g/dL   RDW 15.1 11.5 - 15.5 %   Platelets 185 150 -  400 K/uL  Protime-INR - (order if Patient is taking Coumadin / Warfarin)     Status: None   Collection Time: 12/06/14  9:46 PM  Result Value Ref Range   Prothrombin Time 14.1 11.6 - 15.2 seconds   INR 1.07 0.00 - 1.49   Dg Chest 2 View  12/06/2014  CLINICAL DATA:  64 year old female with shortness of breath EXAM: CHEST  2 VIEW COMPARISON:  Chest radiograph dated 09/24/2014 FINDINGS: Two views of the chest demonstrate a small right-sided pleural effusion, increased in size compared the prior study. There is associated partial compressive atelectasis of the right lung base. There is stable cardiomegaly. Left pectoral AICD device noted. The degenerative changes of the spine. Median sternotomy wires and CABG vascular clips noted. IMPRESSION: Stable cardiomegaly with interval increase in the size of the right pleural effusion. Electronically Signed   By: Anner Crete M.D.   On: 12/06/2014 21:35    Review Of Systems + Lower dentures, Asthma, Chest pain, PE, DVT, Shortness of breath, leg edema,Obesity, DM, II, HTN, CAD, CABG, CVA. No hepatitis, kidney stone or seizures or psych admission.  Blood pressure 130/77, pulse 77, temperature 97.4 F (36.3 C), temperature source Oral, resp. rate 20, SpO2 95 %.  General appearance: alert, cooperative, appears stated age and mild distress Head: Normocephalic, without obvious abnormality, atraumatic Eyes: Brown, conjunctivae/corneas clear. PERRL, EOM's intact. Neck: no adenopathy, no carotid bruit, no JVD, supple, symmetrical, trachea midline and thyroid not enlarged. Resp: clear to auscultation bilaterally Cardio: regular rate and rhythm, S1, S2 normal, no murmur, click, rub or gallop GI: soft, distended, non-tender; bowel sounds normal. Extremities: extremities normal, atraumatic, no cyanosis or edema. Left lower leg calf tenderness and medial aspect of above ankle area tenderness Skin: Skin color, texture, turgor normal. No rashes or  lesions Neurologic: Alert and oriented X 3, normal strength and tone. Normal symmetric reflexes. Normal coordination and gait. + positional vertigo. + Romberg sign Cranial nerves: grossly intact.  Assessment/Plan Shortness of breath CAD S/P Stent Chronic total occlusion of distal RCA Hypertension DM, II Obesity Left leg pain Defibrillator implantation history Paroxysmal atrial fibrillation H/O DVT  Place in observation/R/O MI  [facility] S, MD  12/07/2014, 12:07 AM

## 2014-12-07 NOTE — Progress Notes (Signed)
Nutrition Brief Note  Patient identified on the Malnutrition Screening Tool (MST) Report for being unsure if she lost weight  Wt Readings from Last 15 Encounters:  12/07/14 201 lb 6.4 oz (91.354 kg)  09/28/14 204 lb 11.2 oz (92.851 kg)  02/07/12 219 lb 11.2 oz (99.655 kg)  05/14/11 225 lb 3.2 oz (102.15 kg)  08/07/10 233 lb (105.688 kg)  01/29/10 229 lb (103.874 kg)    Body mass index is 32.52 kg/(m^2). Patient meets criteria for Obese based on current BMI.   Patient reports that her weight loss is likely due to her changing her eating habits. She has stopped snacking on chips and other salty snacks and believes this is why she has lost 3 lbs in the last 3 months. Congratulated her on her beneficial life style change.   She denies n/v/c/d or any trouble with her appetite. She declined snacks/supplements. Current diet order is HH/Carb mod. Labs and medications reviewed.   No nutrition interventions warranted at this time. If nutrition issues arise, please consult RD.   Burtis Junes RD, LDN Nutrition Pager: [phone] 12/07/2014 12:19 PM

## 2014-12-07 NOTE — Progress Notes (Signed)
Ref: Barbette Merino, MD   Subjective:  Good diuresis with lasix. Improving breathing.  Objective:  Vital Signs in the last 24 hours: Temp:  [97.3 F (36.3 C)-98.5 F (36.9 C)] 98.5 F (36.9 C) (12/03 2019) Pulse Rate:  [73-82] 79 (12/03 2019) Cardiac Rhythm:  [-] Normal sinus rhythm;Bundle branch block (12/03 1900) Resp:  [18-20] 18 (12/03 2019) BP: (105-150)/(55-77) 105/55 mmHg (12/03 2019) SpO2:  [92 %-97 %] 97 % (12/03 2019) Weight:  [91.354 kg (201 lb 6.4 oz)] 91.354 kg (201 lb 6.4 oz) (12/03 0118)  Physical Exam: BP Readings from Last 1 Encounters:  12/07/14 105/55    Wt Readings from Last 1 Encounters:  12/07/14 91.354 kg (201 lb 6.4 oz)    Weight change:   HEENT: [place]/AT, Eyes-Brown, PERL, EOMI, Conjunctiva-Pink, Sclera-Non-icteric Neck: No JVD, No bruit, Trachea midline. Lungs:  Clear, Bilateral. Cardiac:  Regular rhythm, normal S1 and S2, no S3.  Abdomen:  Soft, non-tender. Extremities:  No edema present. No cyanosis. No clubbing. CNS: AxOx3, Cranial nerves grossly intact, moves all 4 extremities. Right handed. Skin: Warm and dry.   Intake/Output from previous day: 12/02 0701 - 12/03 0700 In: 600 [P.O.:600] Out: 825 [Urine:825]    Lab Results: BMET    Component Value Date/Time   NA 139 12/07/2014 0244   NA 139 12/06/2014 2146   NA 137 09/28/2014 0032   K 3.5 12/07/2014 0244   K 4.0 12/06/2014 2146   K 4.1 09/28/2014 0032   CL 104 12/07/2014 0244   CL 107 12/06/2014 2146   CL 105 09/28/2014 0032   CO2 27 12/07/2014 0244   CO2 26 12/06/2014 2146   CO2 27 09/28/2014 0032   GLUCOSE 163* 12/07/2014 0244   GLUCOSE 102* 12/06/2014 2146   GLUCOSE 103* 09/28/2014 0032   BUN 12 12/07/2014 0244   BUN 12 12/06/2014 2146   BUN 9 09/28/2014 0032   CREATININE 0.96 12/07/2014 0244   CREATININE 0.85 12/06/2014 2146   CREATININE 0.81 09/28/2014 0032   CALCIUM 9.8 12/07/2014 0244   CALCIUM 10.1 12/06/2014 2146   CALCIUM 10.1 09/28/2014 0032   GFRNONAA >60  12/07/2014 0244   GFRNONAA >60 12/06/2014 2146   GFRNONAA >60 09/28/2014 0032   GFRAA >60 12/07/2014 0244   GFRAA >60 12/06/2014 2146   GFRAA >60 09/28/2014 0032   CBC    Component Value Date/Time   WBC 3.5* 12/07/2014 0244   RBC 4.19 12/07/2014 0244   HGB 11.8* 12/07/2014 0244   HCT 37.5 12/07/2014 0244   PLT 182 12/07/2014 0244   MCV 89.5 12/07/2014 0244   MCH 28.2 12/07/2014 0244   MCHC 31.5 12/07/2014 0244   RDW 15.2 12/07/2014 0244   LYMPHSABS 1.6 09/24/2014 1825   MONOABS 0.5 09/24/2014 1825   EOSABS 0.1 09/24/2014 1825   BASOSABS 0.0 09/24/2014 1825   HEPATIC Function Panel  Recent Labs  09/24/14 1825  PROT 8.2*   HEMOGLOBIN A1C No components found for: HGA1C,  MPG CARDIAC ENZYMES Lab Results  Component Value Date   TROPONINI <0.03 09/25/2014   TROPONINI <0.03 09/25/2014   TROPONINI <0.03 09/24/2014   BNP No results for input(s): PROBNP in the last 8760 hours. TSH No results for input(s): TSH in the last 8760 hours. CHOLESTEROL  Recent Labs  09/27/14 1100 12/07/14 0244  CHOL 241* 216*    Scheduled Meds: . [START ON 12/08/2014] aspirin EC  81 mg Oral Daily  . carvedilol  6.25 mg Oral BID WC  . digoxin  125 mcg  Oral Daily  . folic acid  1 mg Oral Daily  . furosemide  40 mg Oral Daily  . rivaroxaban  20 mg Oral QHS  . simvastatin  20 mg Oral q1800   Continuous Infusions:  PRN Meds:.acetaminophen, nitroGLYCERIN, ondansetron (ZOFRAN) IV  Assessment/Plan: Shortness of breath Acute on chronic left heart systolic failure. Dilated cardiomyopathy CAD S/P Stent Chronic total occlusion of distal RCA Hypertension DM, II Obesity Left leg pain Defibrillator implantation history Paroxysmal atrial fibrillation H/O DVT   Continue medical treatment.     Dixie Dials  MD  12/07/2014, 8:23 PM

## 2014-12-08 LAB — TROPONIN I: Troponin I: <0.03 ng/mL (ref <0.031)

## 2014-12-08 MED ORDER — NITROGLYCERIN 0.4 MG SL SUBL: 0.4000 mg | SUBLINGUAL_TABLET | SUBLINGUAL | Status: DC | PRN

## 2014-12-08 NOTE — Progress Notes (Signed)
Pt d/c home with family. D/c instructions given to and d/w pt. Ptm verbalizes understanding

## 2014-12-08 NOTE — Discharge Summary (Signed)
Physician Discharge Summary  Patient ID: Shelley Douglas MRN: [id number] DOB/AGE: 1950-10-17 64 y.o.  Admit date: 12/06/2014 Discharge date: 12/08/2014  Admission Diagnoses: Shortness of breath CAD S/P Stent Chronic total occlusion of distal RCA Hypertension DM, II Obesity Left leg pain Defibrillator implantation history Paroxysmal atrial fibrillation H/O DVT  Discharge Diagnoses:  Principle Problem: *Acute on chronic left heart systolic failure* CAD S/P Stent Chronic total occlusion of distal RCA Hypertension DM, II Obesity Left leg pain Defibrillator implantation history Paroxysmal atrial fibrillation H/O DVT  Discharged Condition: fair  Hospital Course: 64 y.o. female with h/o Afib, CAD, DM, stroke, CHF, DVT/PE, MI in 2001, defibrillator, CABG who presents to the Emergency Department complaining of moderate, exertional, left-sided chest tightness and pressure onset yesterday with associated SOB, left arm and shoulder pain, numbness in the tips of the left fingers.Pt states she is taking all medications as prescribed. She denies focal weakness, difficulty ambulating. On last cardiac [place] 2 months prior, ejection fracture was 20-25%. She had significant improvement with IV lasix and good diuresis. She was discharged home with f/u by me in 1 week.  Consults: cardiology  Significant Diagnostic Studies: labs: Near normal CBC except mild leukocytopenia. Near normal BMET with blood glucose of 102 mg/dL. BNP elevated at 778.5. Moderately elevated LDL cholesterol of 142 mg. Troponin-I normal x 3.  Chest x-ray: Cardiomegaly with interval increase in size of right pleural effusion.  EKG:- SR + PACs, IVCD.  Treatments: cardiac meds: carvedilol, digoxin, furosemide, Simvastatin and Xarelto  Discharge Exam: Blood pressure 113/44, pulse 71, temperature 97.3 F (36.3 C), temperature source Oral, resp. rate 20, height 5\' 6"  (1.676 m), weight 91.899 kg (202 lb 9.6 oz), SpO2 95  %. HEENT: [place]/AT, Eyes-Brown, PERL, EOMI, Conjunctiva-Pink, Sclera-Non-icteric Neck: No JVD, No bruit, Trachea midline. Lungs: Clear, Bilateral. Cardiac: Regular rhythm, normal S1 and S2, no S3.  Abdomen: Soft, non-tender. Extremities: No edema present. No cyanosis. No clubbing. CNS: AxOx3, Cranial nerves grossly intact, moves all 4 extremities. Right handed. Skin: Warm and dry.  Disposition: 01-Home or Self Care     Medication List    TAKE these medications        carvedilol 6.25 MG tablet  Commonly known as:  COREG  Take 6.25 mg by mouth 2 (two) times daily.     digoxin 0.125 MG tablet  Commonly known as:  LANOXIN  Take 125 mcg by mouth daily.     folic acid 1 MG tablet  Commonly known as:  FOLVITE  Take 1 mg by mouth daily.     furosemide 40 MG tablet  Commonly known as:  LASIX  TAKE ONE TABLET BY MOUTH EVERY DAY     nitroGLYCERIN 0.4 MG SL tablet  Commonly known as:  NITROSTAT  Place 1 tablet (0.4 mg total) under the tongue every 5 (five) minutes x 3 doses as needed for chest pain.     simvastatin 20 MG tablet  Commonly known as:  ZOCOR  Take 20 mg by mouth daily.     XARELTO 20 MG Tabs tablet  Generic drug:  rivaroxaban  Take 20 mg by mouth at bedtime.           Follow-up Information    Follow up with [facility], MD. Schedule an appointment as soon as possible for a visit in 1 month.   Specialty:  Internal Medicine   Contact information:   [place] [place] [address] [phone]       Follow up with [facility],  MD. Schedule an appointment as soon as possible for a visit in 1 week.   Specialty:  Cardiology   Contact information:   [place] [address] [phone]       Signed: Birdie Riddle 12/08/2014, 12:12 PM

## 2014-12-31 ENCOUNTER — Observation Stay (HOSPITAL_COMMUNITY)
Admission: AD | Admit: 2014-12-31 | Discharge: 2015-01-04 | Disposition: A | Discharge status: Home / Self Care | Payer: Medicare HMO | Source: Ambulatory Visit | Attending: Cardiovascular Disease | Admitting: Cardiovascular Disease

## 2014-12-31 ENCOUNTER — Observation Stay (HOSPITAL_COMMUNITY): Payer: Medicare HMO

## 2014-12-31 ENCOUNTER — Encounter (HOSPITAL_COMMUNITY): Payer: Self-pay | Admitting: General Practice

## 2014-12-31 DIAGNOSIS — R000 Tachycardia, unspecified: Secondary | ICD-10-CM | POA: Diagnosis not present

## 2014-12-31 DIAGNOSIS — I4891 Unspecified atrial fibrillation: Secondary | ICD-10-CM | POA: Diagnosis present

## 2014-12-31 DIAGNOSIS — Z8673 Personal history of transient ischemic attack (TIA), and cerebral infarction without residual deficits: Secondary | ICD-10-CM | POA: Insufficient documentation

## 2014-12-31 DIAGNOSIS — Z86711 Personal history of pulmonary embolism: Secondary | ICD-10-CM | POA: Diagnosis not present

## 2014-12-31 DIAGNOSIS — Z87891 Personal history of nicotine dependence: Secondary | ICD-10-CM | POA: Diagnosis not present

## 2014-12-31 DIAGNOSIS — I2582 Chronic total occlusion of coronary artery: Secondary | ICD-10-CM | POA: Diagnosis not present

## 2014-12-31 DIAGNOSIS — I255 Ischemic cardiomyopathy: Secondary | ICD-10-CM | POA: Diagnosis present

## 2014-12-31 DIAGNOSIS — I2510 Atherosclerotic heart disease of native coronary artery without angina pectoris: Secondary | ICD-10-CM | POA: Diagnosis not present

## 2014-12-31 DIAGNOSIS — Z86718 Personal history of other venous thrombosis and embolism: Secondary | ICD-10-CM | POA: Insufficient documentation

## 2014-12-31 DIAGNOSIS — I4892 Unspecified atrial flutter: Secondary | ICD-10-CM | POA: Insufficient documentation

## 2014-12-31 DIAGNOSIS — E119 Type 2 diabetes mellitus without complications: Secondary | ICD-10-CM | POA: Insufficient documentation

## 2014-12-31 DIAGNOSIS — Z7901 Long term (current) use of anticoagulants: Secondary | ICD-10-CM | POA: Diagnosis not present

## 2014-12-31 DIAGNOSIS — Z79899 Other long term (current) drug therapy: Secondary | ICD-10-CM | POA: Insufficient documentation

## 2014-12-31 DIAGNOSIS — E669 Obesity, unspecified: Secondary | ICD-10-CM | POA: Insufficient documentation

## 2014-12-31 DIAGNOSIS — Z95810 Presence of automatic (implantable) cardiac defibrillator: Secondary | ICD-10-CM | POA: Diagnosis not present

## 2014-12-31 DIAGNOSIS — Z955 Presence of coronary angioplasty implant and graft: Secondary | ICD-10-CM | POA: Diagnosis not present

## 2014-12-31 DIAGNOSIS — R42 Dizziness and giddiness: Secondary | ICD-10-CM | POA: Diagnosis not present

## 2014-12-31 DIAGNOSIS — Z6833 Body mass index (BMI) 33.0-33.9, adult: Secondary | ICD-10-CM | POA: Diagnosis not present

## 2014-12-31 DIAGNOSIS — I480 Paroxysmal atrial fibrillation: Secondary | ICD-10-CM | POA: Diagnosis not present

## 2014-12-31 DIAGNOSIS — I110 Hypertensive heart disease with heart failure: Secondary | ICD-10-CM | POA: Diagnosis not present

## 2014-12-31 DIAGNOSIS — I5022 Chronic systolic (congestive) heart failure: Secondary | ICD-10-CM | POA: Diagnosis not present

## 2014-12-31 LAB — CBC WITH DIFFERENTIAL/PLATELET
BASOS PCT: 1 %
Basophils Absolute: 0 10*3/uL (ref 0.0–0.1)
Eosinophils Absolute: 0.1 10*3/uL (ref 0.0–0.7)
Eosinophils Relative: 3 %
HEMATOCRIT: 41.4 % (ref 36.0–46.0)
HEMOGLOBIN: 13.6 g/dL (ref 12.0–15.0)
LYMPHS ABS: 1.6 10*3/uL (ref 0.7–4.0)
LYMPHS PCT: 34 %
MCH: 29.2 pg (ref 26.0–34.0)
MCHC: 32.9 g/dL (ref 30.0–36.0)
MCV: 88.8 fL (ref 78.0–100.0)
MONO ABS: 0.6 10*3/uL (ref 0.1–1.0)
MONOS PCT: 14 %
NEUTROS ABS: 2.3 10*3/uL (ref 1.7–7.7)
NEUTROS PCT: 48 %
Platelets: 216 10*3/uL (ref 150–400)
RBC: 4.66 MIL/uL (ref 3.87–5.11)
RDW: 15.1 % (ref 11.5–15.5)
WBC: 4.7 10*3/uL (ref 4.0–10.5)

## 2014-12-31 LAB — COMPREHENSIVE METABOLIC PANEL
ALBUMIN: 3.3 g/dL — AB (ref 3.5–5.0)
ALK PHOS: 93 U/L (ref 38–126)
ALT: 93 U/L — ABNORMAL HIGH (ref 14–54)
ANION GAP: 7 (ref 5–15)
AST: 67 U/L — ABNORMAL HIGH (ref 15–41)
BILIRUBIN TOTAL: 0.5 mg/dL (ref 0.3–1.2)
BUN: 16 mg/dL (ref 6–20)
CALCIUM: 10 mg/dL (ref 8.9–10.3)
CO2: 27 mmol/L (ref 22–32)
Chloride: 106 mmol/L (ref 101–111)
Creatinine, Ser: 1.09 mg/dL — ABNORMAL HIGH (ref 0.44–1.00)
GFR calc Af Amer: >60 mL/min (ref >60)
GFR, EST NON AFRICAN AMERICAN: 52 mL/min — AB (ref >60)
GLUCOSE: 143 mg/dL — AB (ref 65–99)
POTASSIUM: 4.4 mmol/L (ref 3.5–5.1)
Sodium: 140 mmol/L (ref 135–145)
TOTAL PROTEIN: 6.9 g/dL (ref 6.5–8.1)

## 2014-12-31 LAB — GLUCOSE, CAPILLARY: GLUCOSE-CAPILLARY: 143 mg/dL — AB (ref 65–99)

## 2014-12-31 MED ORDER — RIVAROXABAN 20 MG PO TABS
20.0000 mg | ORAL_TABLET | Freq: Every day | ORAL | Status: DC
  Administered 2014-12-31 – 2015-01-03 (×4): 20 mg via ORAL
  Filled 2014-12-31 (×4): qty 1

## 2014-12-31 MED ORDER — NITROGLYCERIN 0.4 MG SL SUBL: 0.4000 mg | SUBLINGUAL_TABLET | SUBLINGUAL | Status: DC | PRN

## 2014-12-31 MED ORDER — DIGOXIN 125 MCG PO TABS
125.0000 ug | ORAL_TABLET | Freq: Every day | ORAL | Status: DC
  Administered 2014-12-31 – 2015-01-04 (×5): 125 ug via ORAL
  Filled 2014-12-31 (×6): qty 1

## 2014-12-31 MED ORDER — SIMVASTATIN 20 MG PO TABS
20.0000 mg | ORAL_TABLET | Freq: Every day | ORAL | Status: DC
  Administered 2014-12-31 – 2015-01-03 (×4): 20 mg via ORAL
  Filled 2014-12-31 (×4): qty 1

## 2014-12-31 MED ORDER — AMIODARONE HCL 200 MG PO TABS
400.0000 mg | ORAL_TABLET | Freq: Two times a day (BID) | ORAL | Status: AC
Start: 2014-12-31 — End: 2015-01-01
  Administered 2014-12-31 – 2015-01-01 (×4): 400 mg via ORAL
  Filled 2014-12-31 (×5): qty 2

## 2014-12-31 MED ORDER — FUROSEMIDE 40 MG PO TABS
40.0000 mg | ORAL_TABLET | Freq: Every day | ORAL | Status: DC
  Administered 2014-12-31: 40 mg via ORAL
  Filled 2014-12-31: qty 1

## 2014-12-31 MED ORDER — CARVEDILOL 6.25 MG PO TABS
6.2500 mg | ORAL_TABLET | Freq: Two times a day (BID) | ORAL | Status: DC
  Administered 2014-12-31 – 2015-01-04 (×9): 6.25 mg via ORAL
  Filled 2014-12-31 (×9): qty 1

## 2014-12-31 MED ORDER — SODIUM CHLORIDE 0.9 % IJ SOLN
3.0000 mL | Freq: Two times a day (BID) | INTRAMUSCULAR | Status: DC
  Administered 2014-12-31 – 2015-01-04 (×9): 3 mL via INTRAVENOUS

## 2014-12-31 MED ORDER — FOLIC ACID 1 MG PO TABS
1.0000 mg | ORAL_TABLET | Freq: Every day | ORAL | Status: DC
  Administered 2014-12-31 – 2015-01-04 (×5): 1 mg via ORAL
  Filled 2014-12-31 (×5): qty 1

## 2014-12-31 NOTE — Progress Notes (Signed)
Pt arrived to the unit as a direct admit from MD office; pt A&O x4; sob with exertion; pt oriented to the unit and room; oriented to the call light and educated on safety and fall prevention; pt voices understanding and denies any questions. MD called and notified of pt arrival to the unit; awaiting on new orders. IV started to left forearm; telemetry applied and verified with CCMD and confirmed by NT; VSS; pt in bed with call light within reach. Will closely monitor. Delia Heady RN

## 2014-12-31 NOTE — Progress Notes (Signed)
At 1530 introduced self to pt as incoming nurse 3p-7p.  Call bell at bedside and instructed pt to call as needed.  Verbalized understanding.  Karie Kirks, Therapist, sports.

## 2014-12-31 NOTE — H&P (Signed)
Referring Physician:  [name] is an 64 y.o. female.                       Chief Complaint: Dizziness  HPI: 64 year old female with dizziness and palpitations x 2 days. No chest pain or leg edema or shortness of breath.  Past Medical History  Diagnosis Date  . Atrial fibrillation ([place])   . CAD (coronary artery disease)   . Mitral valve regurgitation   . DM (diabetes mellitus) ([place])   . Hyperthyroidism   . Stroke ([place]) 2001  . CHF (congestive heart failure) ([place])   . Dizziness   . Anxiety   . Obesity   . Pulmonary embolism ([place])   . DVT (deep venous thrombosis) [facility])       Past Surgical History  Procedure Laterality Date  . Coronary angioplasty with stent placement    . Coronary artery bypass graft    . Cardiac defibrillator placement      [employer]  . Vena cava filter placement    . Cardiac catheterization N/A 09/27/2014    Procedure: Left Heart Cath and Coronary Angiography;  Surgeon: Dixie Dials, MD;  Location: [facility];  Service: Cardiovascular;  Laterality: N/A;    No family history on file. Social History:  reports that she quit smoking about 27 years ago. She does not have any smokeless tobacco history on file. She reports that she does not drink alcohol or use illicit drugs.  Allergies:  Allergies  Allergen Reactions  . Clopidogrel Bisulfate Rash    Medications Prior to Admission  Medication Sig Dispense Refill  . carvedilol (COREG) 6.25 MG tablet Take 6.25 mg by mouth 2 (two) times daily.    . digoxin (LANOXIN) 0.125 MG tablet Take 125 mcg by mouth daily.     . folic acid (FOLVITE) 1 MG tablet Take 1 mg by mouth daily.     . furosemide (LASIX) 40 MG tablet TAKE ONE TABLET BY MOUTH EVERY DAY 30 tablet 0  . nitroGLYCERIN (NITROSTAT) 0.4 MG SL tablet Place 1 tablet (0.4 mg total) under the tongue every 5 (five) minutes x 3 doses as needed for chest pain. 25 tablet 1  . simvastatin (ZOCOR) 20 MG tablet Take 20 mg by mouth daily.     Alveda Reasons 20 MG TABS tablet Take 20 mg by mouth at bedtime.      No results found for this or any previous visit (from the past 48 hour(s)). No results found.  Review Of Systems + Lower dentures. Asthma, Chest pain, PE, DVT, Shortness of breath, leg edema Obesity, DM, II, HTN, CAD, CABG, CVA.  No hepatitis, kidney stone No seizures or psych admission.  Blood pressure 124/90, pulse 130, temperature 97.6 F (36.4 C), temperature source Oral, resp. rate 18, height 5\' 7"  (1.702 m), weight 97.07 kg (214 lb), SpO2 100 %. Physical Exam: General appearance: alert, cooperative, appears stated age and mild distress Head: Normocephalic, without obvious abnormality, atraumatic Eyes: Brown, conjunctivae/corneas clear. PERRL, EOM's intact. Neck: no adenopathy, no carotid bruit, no JVD, supple, symmetrical, trachea midline and thyroid not enlarged. Resp: clear to auscultation bilaterally Cardio: Irregular rate and rhythm, tachycardic, S1, S2 normal, no murmur, click, rub or gallop GI: soft, distended, non-tender; bowel sounds normal. Extremities: extremities normal, atraumatic, no cyanosis or edema.  Skin: Skin color, texture, turgor normal. No rashes or lesions Neurologic: Alert and oriented X 3, Normal symmetric reflexes. Normal coordination and gait. +  positional vertigo. Cranial nerves: grossly intact.  Assessment/Plan Dizziness Tachycardia r/o atrial flutter/fibrillation with rapid ventricular response-CHA2DS2VASc score of 6/9 CAD, native vessel S/P Stent in RCA Chronic total occlusion of distal RCA Hypertension DM, II Obesity Left leg pain Defibrillator implantation history Paroxysmal atrial fibrillation H/O DVT  Place in observation. Increase coreg or add amiodarone  Shelley Douglas S, MD  12/31/2014, 11:31 AM

## 2014-12-31 NOTE — Discharge Instructions (Signed)
Rivaroxaban oral tablets What is this medicine? RIVAROXABAN (ri va ROX a ban) is an anticoagulant (blood thinner). It is used to treat blood clots in the lungs or in the veins. It is also used after knee or hip surgeries to prevent blood clots. It is also used to lower the chance of stroke in people with a medical condition called atrial fibrillation. This medicine may be used for other purposes; ask your health care provider or pharmacist if you have questions. What should I tell my health care provider before I take this medicine? They need to know if you have any of these conditions: -bleeding disorders -bleeding in the brain -blood in your stools (black or tarry stools) or if you have blood in your vomit -history of stomach bleeding -kidney disease -liver disease -low blood counts, like low white cell, platelet, or red cell counts -recent or planned spinal or epidural procedure -take medicines that treat or prevent blood clots -an unusual or allergic reaction to rivaroxaban, other medicines, foods, dyes, or preservatives -pregnant or trying to get pregnant -breast-feeding How should I use this medicine? Take this medicine by mouth with a glass of water. Follow the directions on the prescription label. Take your medicine at regular intervals. Do not take it more often than directed. Do not stop taking except on your doctor's advice. Stopping this medicine may increase your risk of a blood clot. Be sure to refill your prescription before you run out of medicine. If you are taking this medicine after hip or knee replacement surgery, take it with or without food. If you are taking this medicine for atrial fibrillation, take it with your evening meal. If you are taking this medicine to treat blood clots, take it with food at the same time each day. If you are unable to swallow your tablet, you may crush the tablet and mix it in applesauce. Then, immediately eat the applesauce. You should eat more  food right after you eat the applesauce containing the crushed tablet. Talk to your pediatrician regarding the use of this medicine in children. Special care may be needed. Overdosage: If you think you have taken too much of this medicine contact a poison control center or emergency room at once. NOTE: This medicine is only for you. Do not share this medicine with others. What if I miss a dose? If you take your medicine once a day and miss a dose, take the missed dose as soon as you remember. If you take your medicine twice a day and miss a dose, take the missed dose immediately. In this instance, 2 tablets may be taken at the same time. The next day you should take 1 tablet twice a day as directed. What may interact with this medicine? -aspirin and aspirin-like medicines -certain antibiotics like erythromycin, azithromycin, and clarithromycin -certain medicines for fungal infections like ketoconazole and itraconazole -certain medicines for irregular heart beat like amiodarone, quinidine, dronedarone -certain medicines for seizures like carbamazepine, phenytoin -certain medicines that treat or prevent blood clots like warfarin, enoxaparin, and dalteparin -conivaptan -diltiazem -felodipine -indinavir -lopinavir; ritonavir -NSAIDS, medicines for pain and inflammation, like ibuprofen or naproxen -ranolazine -rifampin -ritonavir -St. John's wort -verapamil This list may not describe all possible interactions. Give your health care provider a list of all the medicines, herbs, non-prescription drugs, or dietary supplements you use. Also tell them if you smoke, drink alcohol, or use illegal drugs. Some items may interact with your medicine. What should I watch for while using this   medicine? Visit your doctor or health care professional for regular checks on your progress. Your condition will be monitored carefully while you are receiving this medicine. Notify your doctor or health care  professional and seek emergency treatment if you develop breathing problems; changes in vision; chest pain; severe, sudden headache; pain, swelling, warmth in the leg; trouble speaking; sudden numbness or weakness of the face, arm, or leg. These can be signs that your condition has gotten worse. If you are going to have surgery, tell your doctor or health care professional that you are taking this medicine. Tell your health care professional that you use this medicine before you have a spinal or epidural procedure. Sometimes people who take this medicine have bleeding problems around the spine when they have a spinal or epidural procedure. This bleeding is very rare. If you have a spinal or epidural procedure while on this medicine, call your health care professional immediately if you have back pain, numbness or tingling (especially in your legs and feet), muscle weakness, paralysis, or loss of bladder or bowel control. Avoid sports and activities that might cause injury while you are using this medicine. Severe falls or injuries can cause unseen bleeding. Be careful when using sharp tools or knives. Consider using an electric razor. Take special care brushing or flossing your teeth. Report any injuries, bruising, or red spots on the skin to your doctor or health care professional. What side effects may I notice from receiving this medicine? Side effects that you should report to your doctor or health care professional as soon as possible: -allergic reactions like skin rash, itching or hives, swelling of the face, lips, or tongue -back pain -redness, blistering, peeling or loosening of the skin, including inside the mouth -signs and symptoms of bleeding such as bloody or black, tarry stools; red or dark-brown urine; spitting up blood or brown material that looks like coffee grounds; red spots on the skin; unusual bruising or bleeding from the eye, gums, or nose Side effects that usually do not require  medical attention (Report these to your doctor or health care professional if they continue or are bothersome.): -dizziness -muscle pain This list may not describe all possible side effects. Call your doctor for medical advice about side effects. You may report side effects to FDA at 1-800-FDA-1088. Where should I keep my medicine? Keep out of the reach of children. Store at room temperature between 15 and 30 degrees C (59 and 86 degrees F). Throw away any unused medicine after the expiration date. NOTE: This sheet is a summary. It may not cover all possible information. If you have questions about this medicine, talk to your doctor, pharmacist, or health care provider.    2016, Elsevier/Gold Standard. (2013-12-19 12:45:34)  

## 2015-01-01 DIAGNOSIS — R42 Dizziness and giddiness: Secondary | ICD-10-CM | POA: Diagnosis not present

## 2015-01-01 DIAGNOSIS — I2510 Atherosclerotic heart disease of native coronary artery without angina pectoris: Secondary | ICD-10-CM | POA: Diagnosis not present

## 2015-01-01 DIAGNOSIS — I2582 Chronic total occlusion of coronary artery: Secondary | ICD-10-CM | POA: Diagnosis not present

## 2015-01-01 DIAGNOSIS — R000 Tachycardia, unspecified: Secondary | ICD-10-CM | POA: Diagnosis not present

## 2015-01-01 LAB — BASIC METABOLIC PANEL
Anion gap: 6 (ref 5–15)
BUN: 16 mg/dL (ref 6–20)
CALCIUM: 9.8 mg/dL (ref 8.9–10.3)
CO2: 27 mmol/L (ref 22–32)
CREATININE: 1.04 mg/dL — AB (ref 0.44–1.00)
Chloride: 105 mmol/L (ref 101–111)
GFR calc Af Amer: >60 mL/min (ref >60)
GFR, EST NON AFRICAN AMERICAN: 56 mL/min — AB (ref >60)
GLUCOSE: 142 mg/dL — AB (ref 65–99)
Potassium: 4.1 mmol/L (ref 3.5–5.1)
SODIUM: 138 mmol/L (ref 135–145)

## 2015-01-01 MED ORDER — FUROSEMIDE 10 MG/ML IJ SOLN
40.0000 mg | Freq: Once | INTRAMUSCULAR | Status: AC
  Administered 2015-01-01: 40 mg via INTRAVENOUS
  Filled 2015-01-01: qty 4

## 2015-01-01 NOTE — Progress Notes (Signed)
Ref: GARBA,LAWAL, MD   Subjective:  Improved heart rate but feel weak and dizzy. Some discomfort in epigastric area. Afebrile.  Objective:  Vital Signs in the last 24 hours: Temp:  [97.6 F (36.4 C)-98.2 F (36.8 C)] 98 F (36.7 C) (12/28 1222) Pulse Rate:  [67-84] 71 (12/28 1222) Cardiac Rhythm:  [-] Ventricular paced (12/28 1900) Resp:  [16-18] 18 (12/28 1222) BP: (94-127)/(61-78) 102/61 mmHg (12/28 1222) SpO2:  [97 %-100 %] 99 % (12/28 1222) Weight:  [98.2 kg (216 lb 7.9 oz)] 98.2 kg (216 lb 7.9 oz) (12/28 0515)  Physical Exam: BP Readings from Last 1 Encounters:  01/01/15 102/61    Wt Readings from Last 1 Encounters:  01/01/15 98.2 kg (216 lb 7.9 oz)    Weight change:   HEENT: [place]/AT, Eyes-Brown, PERL, EOMI, Conjunctiva-Pink, Sclera-Non-icteric Neck: No JVD, No bruit, Trachea midline. Lungs:  Clear, Bilateral. Cardiac:  Irregular rhythm, normal S1 and S2, no S3.  Abdomen:  Soft, non-tender. Extremities:  No edema present. No cyanosis. No clubbing. CNS: AxOx3, Cranial nerves grossly intact, moves all 4 extremities. Right handed. Skin: Warm and dry.   Intake/Output from previous day: 12/27 0701 - 12/28 0700 In: 560 [P.O.:560] Out: 950 [Urine:950]    Lab Results: BMET    Component Value Date/Time   NA 138 01/01/2015 0331   NA 140 12/31/2014 1300   NA 139 12/07/2014 0244   K 4.1 01/01/2015 0331   K 4.4 12/31/2014 1300   K 3.5 12/07/2014 0244   CL 105 01/01/2015 0331   CL 106 12/31/2014 1300   CL 104 12/07/2014 0244   CO2 27 01/01/2015 0331   CO2 27 12/31/2014 1300   CO2 27 12/07/2014 0244   GLUCOSE 142* 01/01/2015 0331   GLUCOSE 143* 12/31/2014 1300   GLUCOSE 163* 12/07/2014 0244   BUN 16 01/01/2015 0331   BUN 16 12/31/2014 1300   BUN 12 12/07/2014 0244   CREATININE 1.04* 01/01/2015 0331   CREATININE 1.09* 12/31/2014 1300   CREATININE 0.96 12/07/2014 0244   CALCIUM 9.8 01/01/2015 0331   CALCIUM 10.0 12/31/2014 1300   CALCIUM 9.8 12/07/2014 0244   GFRNONAA 56* 01/01/2015 0331   GFRNONAA 52* 12/31/2014 1300   GFRNONAA >60 12/07/2014 0244   GFRAA >60 01/01/2015 0331   GFRAA >60 12/31/2014 1300   GFRAA >60 12/07/2014 0244   CBC    Component Value Date/Time   WBC 4.7 12/31/2014 1300   RBC 4.66 12/31/2014 1300   HGB 13.6 12/31/2014 1300   HCT 41.4 12/31/2014 1300   PLT 216 12/31/2014 1300   MCV 88.8 12/31/2014 1300   MCH 29.2 12/31/2014 1300   MCHC 32.9 12/31/2014 1300   RDW 15.1 12/31/2014 1300   LYMPHSABS 1.6 12/31/2014 1300   MONOABS 0.6 12/31/2014 1300   EOSABS 0.1 12/31/2014 1300   BASOSABS 0.0 12/31/2014 1300   HEPATIC Function Panel  Recent Labs  09/24/14 1825 12/31/14 1300  PROT 8.2* 6.9   HEMOGLOBIN A1C No components found for: HGA1C,  MPG CARDIAC ENZYMES Lab Results  Component Value Date   TROPONINI <0.03 12/08/2014   TROPONINI <0.03 12/08/2014   TROPONINI <0.03 12/07/2014   BNP No results for input(s): PROBNP in the last 8760 hours. TSH No results for input(s): TSH in the last 8760 hours. CHOLESTEROL  Recent Labs  09/27/14 1100 12/07/14 0244  CHOL 241* 216*    Scheduled Meds: . carvedilol  6.25 mg Oral BID  . digoxin  125 mcg Oral Daily  . folic acid  1 mg Oral Daily  . rivaroxaban  20 mg Oral Q supper  . simvastatin  20 mg Oral q1800  . sodium chloride  3 mL Intravenous Q12H   Continuous Infusions:  PRN Meds:.nitroGLYCERIN  Assessment/Plan: Dizziness Tachycardia-improved Atrial flutter/fibrillation with rapid ventricular response-CHA2DS2VASc score of 6/9 CAD, native vessel S/P Stent in RCA Chronic total occlusion of distal RCA Hypertension DM, II Obesity Left leg pain Defibrillator implantation history Paroxysmal atrial fibrillation H/O DVT   EKG PT consult.    Dixie Dials  MD  01/01/2015, 9:09 PM

## 2015-01-01 NOTE — Care Management Obs Status (Signed)
[place] NOTIFICATION   Patient Details  Name: Shelley Douglas MRN: [id number] Date of Birth: [birth year]   Medicare Observation Status Notification Given:  Ernesta Amble, RN 01/01/2015, 9:11 PM

## 2015-01-02 DIAGNOSIS — R42 Dizziness and giddiness: Secondary | ICD-10-CM | POA: Diagnosis not present

## 2015-01-02 MED ORDER — FUROSEMIDE 10 MG/ML IJ SOLN
40.0000 mg | Freq: Once | INTRAMUSCULAR | Status: AC
  Administered 2015-01-02: 40 mg via INTRAVENOUS
  Filled 2015-01-02: qty 4

## 2015-01-02 NOTE — Evaluation (Signed)
Physical Therapy Evaluation Patient Details Name: Shelley Douglas MRN: [id number] DOB: January 15, 1950 Today's Date: 01/02/2015   History of Present Illness  64 y.o. female admitted to [facility] on 01/02/15 for syncope and dizziness.  Cardiology following and pt found to have tachycardia and Afib/slutter.  Pt with significant PMHx of A-fib, CHF, CAD, mitral valve regurgitation, DM, stroke, obesity, dizziness, obesity, anxiety, PE, DVT, SOB, CABG, and vena cava filter placement.   Clinical Impression  Pt's primary physical deficit is her endurance.  She can only go a short distance into the hallway before her HR increased and she starts to have DOE.  At this time she would benefit from a rollator to help increase her safety and help improve her endurance during physical activity. Vestibular screen preformed to make sure there is no underlying vestibular component and that was clear.   PT to follow acutely for deficits listed below, but do not recommend PT f/u at discharge.      Follow Up Recommendations No PT follow up    Equipment Recommendations  Rolling walker with 5" wheels;Other (comment) (4 wheeled RW with a seat. )    Recommendations for Other Services   NA    Precautions / Restrictions Precautions Precautions: Other (comment) Precaution Comments: monitor HR during activity      Mobility  Bed Mobility               General bed mobility comments: Pt OOB in the recliner chair.   Transfers Overall transfer level: Modified independent Equipment used: None             General transfer comment: uses hands for transitions  Ambulation/Gait Ambulation/Gait assistance: Supervision Ambulation Distance (Feet): 65 Feet Assistive device: None Gait Pattern/deviations: Step-through pattern;Staggering left;Staggering right Gait velocity: decreased Gait velocity interpretation: Below normal speed for age/gender General Gait Details: pt with very mildly staggering gait patter,  however, very limited endurance.  DOE 2-3/4 with short distance gait and HR increased from 70-124 before PT made pt turn back to her room.  As HR increased, dyspnea increased.  Pt, although fairly steady on her feet, would benefit from an assistive device for improved safety and endurance.  She reports when she "gets like this" is when she feels like she is going to pass out.          Balance Overall balance assessment: Needs assistance Sitting-balance support: Feet supported;No upper extremity supported Sitting balance-Leahy Scale: Normal     Standing balance support: No upper extremity supported Standing balance-Leahy Scale: Good                               Pertinent Vitals/Pain Pain Assessment: No/denies pain    Home Living Family/patient expects to be discharged to:: Private residence Living Arrangements: Spouse/significant other Available Help at Discharge: Family Type of Home: House Home Access: Stairs to enter Entrance Stairs-Rails: Right Entrance Stairs-Number of Steps: "a few" Home Layout: One level Home Equipment: None      Prior Function Level of Independence: Independent ("Docotr would like for me to have an assistive device")                  Extremity/Trunk Assessment   Upper Extremity Assessment: Overall WFL for tasks assessed           Lower Extremity Assessment: Overall WFL for tasks assessed      Cervical / Trunk Assessment: Normal  Communication   Communication:  No difficulties  Cognition Arousal/Alertness: Awake/alert Behavior During Therapy: WFL for tasks assessed/performed Overall Cognitive Status: Within Functional Limits for tasks assessed                               Assessment/Plan    PT Assessment Patient needs continued PT services  PT Diagnosis Generalized weakness;Difficulty walking   PT Problem List Decreased activity tolerance;Decreased balance;Decreased mobility;Cardiopulmonary status  limiting activity  PT Treatment Interventions DME instruction;Gait training;Stair training;Functional mobility training;Therapeutic activities;Therapeutic exercise;Balance training;Neuromuscular re-education;Patient/family education   PT Goals (Current goals can be found in the Care Plan section) Acute Rehab PT Goals Patient Stated Goal: to feel better, not pass out PT Goal Formulation: With patient/family Time For Goal Achievement: 01/16/15 Potential to Achieve Goals: Good    Frequency Min 3X/week           End of Session   Activity Tolerance: Patient limited by fatigue Patient left: in chair;with call bell/phone within reach;with family/visitor present Nurse Communication: Mobility status    Functional Assessment Tool Used: assist level Functional Limitation: Mobility: Walking and moving around Mobility: Walking and Moving Around Current Status [id number]): At least 1 percent but less than 20 percent impaired, limited or restricted Mobility: Walking and Moving Around Goal Status [phone]): 0 percent impaired, limited or restricted    Time: 1031-1050 PT Time Calculation (min) (ACUTE ONLY): 19 min   Charges:   PT Evaluation $Initial PT Evaluation Tier I: 1 Procedure     PT G Codes:   PT G-Codes **NOT FOR INPATIENT CLASS** Functional Assessment Tool Used: assist level Functional Limitation: Mobility: Walking and moving around Mobility: Walking and Moving Around Current Status [id number]): At least 1 percent but less than 20 percent impaired, limited or restricted Mobility: Walking and Moving Around Goal Status [phone]): 0 percent impaired, limited or restricted    Shelley Douglas B. Shelley Douglas, PT, DPT [phone]   01/02/2015, 11:08 AM

## 2015-01-02 NOTE — Progress Notes (Signed)
Ref: GARBA,LAWAL, MD   Subjective:  ICD interrogation revealed near battery replacement time with some fluid overload.   Objective:  Vital Signs in the last 24 hours: Temp:  [97.6 F (36.4 C)-97.9 F (36.6 C)] 97.9 F (36.6 C) (12/29 2038) Pulse Rate:  [68-124] 73 (12/29 2038) Cardiac Rhythm:  [-] Atrial flutter (12/29 2051) Resp:  [17-18] 17 (12/29 2038) BP: (111-126)/(66-79) 126/68 mmHg (12/29 2038) SpO2:  [96 %-98 %] 96 % (12/29 2038) Weight:  [97.7 kg (215 lb 6.2 oz)] 97.7 kg (215 lb 6.2 oz) (12/29 0512)  Physical Exam: BP Readings from Last 1 Encounters:  01/02/15 126/68    Wt Readings from Last 1 Encounters:  01/02/15 97.7 kg (215 lb 6.2 oz)    Weight change: 0.63 kg (1 lb 6.2 oz)  HEENT: Fred[place]/AT, Eyes-Brown, PERL, EOMI, Conjunctiva-Pink, Sclera-Non-icteric Neck: No JVD, No bruit, Trachea midline. Lungs:  Clear, Bilateral. Cardiac:  Irregular rhythm, normal S1 and S2, no S3.  Abdomen:  Soft, non-tender. Extremities:  No edema present. No cyanosis. No clubbing. CNS: AxOx3, Cranial nerves grossly intact, moves all 4 extremities. Right handed. Skin: Warm and dry.   Intake/Output from previous day: 12/28 0701 - 12/29 0700 In: [id number] [P.O.:840; I.V.:3] Out: 1600 [Urine:1600]    Lab Results: BMET    Component Value Date/Time   NA 138 01/01/2015 0331   NA 140 12/31/2014 1300   NA 139 12/07/2014 0244   K 4.1 01/01/2015 0331   K 4.4 12/31/2014 1300   K 3.5 12/07/2014 0244   CL 105 01/01/2015 0331   CL 106 12/31/2014 1300   CL 104 12/07/2014 0244   CO2 27 01/01/2015 0331   CO2 27 12/31/2014 1300   CO2 27 12/07/2014 0244   GLUCOSE 142* 01/01/2015 0331   GLUCOSE 143* 12/31/2014 1300   GLUCOSE 163* 12/07/2014 0244   BUN 16 01/01/2015 0331   BUN 16 12/31/2014 1300   BUN 12 12/07/2014 0244   CREATININE 1.04* 01/01/2015 0331   CREATININE 1.09* 12/31/2014 1300   CREATININE 0.96 12/07/2014 0244   CALCIUM 9.8 01/01/2015 0331   CALCIUM 10.0 12/31/2014 1300   CALCIUM  9.8 12/07/2014 0244   GFRNONAA 56* 01/01/2015 0331   GFRNONAA 52* 12/31/2014 1300   GFRNONAA >60 12/07/2014 0244   GFRAA >60 01/01/2015 0331   GFRAA >60 12/31/2014 1300   GFRAA >60 12/07/2014 0244   CBC    Component Value Date/Time   WBC 4.7 12/31/2014 1300   RBC 4.66 12/31/2014 1300   HGB 13.6 12/31/2014 1300   HCT 41.4 12/31/2014 1300   PLT 216 12/31/2014 1300   MCV 88.8 12/31/2014 1300   MCH 29.2 12/31/2014 1300   MCHC 32.9 12/31/2014 1300   RDW 15.1 12/31/2014 1300   LYMPHSABS 1.6 12/31/2014 1300   MONOABS 0.6 12/31/2014 1300   EOSABS 0.1 12/31/2014 1300   BASOSABS 0.0 12/31/2014 1300   HEPATIC Function Panel  Recent Labs  09/24/14 1825 12/31/14 1300  PROT 8.2* 6.9   HEMOGLOBIN A1C No components found for: HGA1C,  MPG CARDIAC ENZYMES Lab Results  Component Value Date   TROPONINI <0.03 12/08/2014   TROPONINI <0.03 12/08/2014   TROPONINI <0.03 12/07/2014   BNP No results for input(s): PROBNP in the last 8760 hours. TSH No results for input(s): TSH in the last 8760 hours. CHOLESTEROL  Recent Labs  09/27/14 1100 12/07/14 0244  CHOL 241* 216*    Scheduled Meds: . carvedilol  6.25 mg Oral BID  . digoxin  125 mcg Oral  Daily  . folic acid  1 mg Oral Daily  . rivaroxaban  20 mg Oral Q supper  . simvastatin  20 mg Oral q1800  . sodium chloride  3 mL Intravenous Q12H   Continuous Infusions:  PRN Meds:.nitroGLYCERIN  Assessment/Plan: Dizziness Tachycardia-improved Chronic left heart systolic failure Atrial flutter/fibrillation with rapid ventricular response-CHA2DS2VASc score of 6/9 CAD, native vessel S/P Stent in RCA Chronic total occlusion of distal RCA Hypertension DM, II Obesity Left leg pain Defibrillator implantation history Paroxysmal atrial fibrillation H/O DVT  IV lasix/Awaiting PT consult    Dixie Dials  MD  01/02/2015, 10:18 PM

## 2015-01-03 DIAGNOSIS — R42 Dizziness and giddiness: Secondary | ICD-10-CM | POA: Diagnosis not present

## 2015-01-03 LAB — BASIC METABOLIC PANEL
ANION GAP: 7 (ref 5–15)
BUN: 19 mg/dL (ref 6–20)
CHLORIDE: 106 mmol/L (ref 101–111)
CO2: 26 mmol/L (ref 22–32)
Calcium: 9.8 mg/dL (ref 8.9–10.3)
Creatinine, Ser: 1.24 mg/dL — ABNORMAL HIGH (ref 0.44–1.00)
GFR calc non Af Amer: 45 mL/min — ABNORMAL LOW (ref >60)
GFR, EST AFRICAN AMERICAN: 52 mL/min — AB (ref >60)
Glucose, Bld: 136 mg/dL — ABNORMAL HIGH (ref 65–99)
POTASSIUM: 4.2 mmol/L (ref 3.5–5.1)
SODIUM: 139 mmol/L (ref 135–145)

## 2015-01-03 MED ORDER — FUROSEMIDE 10 MG/ML IJ SOLN
40.0000 mg | Freq: Once | INTRAMUSCULAR | Status: AC
  Administered 2015-01-03: 40 mg via INTRAVENOUS
  Filled 2015-01-03: qty 4

## 2015-01-03 NOTE — Progress Notes (Signed)
Ref: Barbette Merino, MD   Subjective:  Good diuresis with lasix. Increasing ambulation.  Objective:  Vital Signs in the last 24 hours: Temp:  [97.5 F (36.4 C)-97.9 F (36.6 C)] 97.5 F (36.4 C) (12/30 1321) Pulse Rate:  [73-82] 75 (12/30 1321) Cardiac Rhythm:  [-] Other (Comment) (12/30 1100) Resp:  [16-17] 16 (12/30 1321) BP: (116-136)/(59-72) 116/59 mmHg (12/30 1321) SpO2:  [93 %-99 %] 99 % (12/30 1321) Weight:  [97.705 kg (215 lb 6.4 oz)] 97.705 kg (215 lb 6.4 oz) (12/30 [id number])  Physical Exam: BP Readings from Last 1 Encounters:  01/03/15 116/59    Wt Readings from Last 1 Encounters:  01/03/15 97.705 kg (215 lb 6.4 oz)    Weight change: 0.005 kg (0.2 oz)  HEENT: Bowie/AT, Eyes-Brown, PERL, EOMI, Conjunctiva-Pink, Sclera-Non-icteric Neck: No JVD, No bruit, Trachea midline. Lungs:  Clear, Bilateral. Cardiac: Irregular rhythm, normal S1 and S2, no S3.  Abdomen:  Soft, non-tender. Extremities:  Trace ankle edema present. No cyanosis. No clubbing. CNS: AxOx3, Cranial nerves grossly intact, moves all 4 extremities. Right handed. Skin: Warm and dry.   Intake/Output from previous day: 12/29 0701 - 12/30 0700 In: 1300 [P.O.:1300] Out: 2677 [Urine:2676; Stool:1]    Lab Results: BMET    Component Value Date/Time   NA 139 01/03/2015 0600   NA 138 01/01/2015 0331   NA 140 12/31/2014 1300   K 4.2 01/03/2015 0600   K 4.1 01/01/2015 0331   K 4.4 12/31/2014 1300   CL 106 01/03/2015 0600   CL 105 01/01/2015 0331   CL 106 12/31/2014 1300   CO2 26 01/03/2015 0600   CO2 27 01/01/2015 0331   CO2 27 12/31/2014 1300   GLUCOSE 136* 01/03/2015 0600   GLUCOSE 142* 01/01/2015 0331   GLUCOSE 143* 12/31/2014 1300   BUN 19 01/03/2015 0600   BUN 16 01/01/2015 0331   BUN 16 12/31/2014 1300   CREATININE 1.24* 01/03/2015 0600   CREATININE 1.04* 01/01/2015 0331   CREATININE 1.09* 12/31/2014 1300   CALCIUM 9.8 01/03/2015 0600   CALCIUM 9.8 01/01/2015 0331   CALCIUM 10.0 12/31/2014 1300    GFRNONAA 45* 01/03/2015 0600   GFRNONAA 56* 01/01/2015 0331   GFRNONAA 52* 12/31/2014 1300   GFRAA 52* 01/03/2015 0600   GFRAA >60 01/01/2015 0331   GFRAA >60 12/31/2014 1300   CBC    Component Value Date/Time   WBC 4.7 12/31/2014 1300   RBC 4.66 12/31/2014 1300   HGB 13.6 12/31/2014 1300   HCT 41.4 12/31/2014 1300   PLT 216 12/31/2014 1300   MCV 88.8 12/31/2014 1300   MCH 29.2 12/31/2014 1300   MCHC 32.9 12/31/2014 1300   RDW 15.1 12/31/2014 1300   LYMPHSABS 1.6 12/31/2014 1300   MONOABS 0.6 12/31/2014 1300   EOSABS 0.1 12/31/2014 1300   BASOSABS 0.0 12/31/2014 1300   HEPATIC Function Panel  Recent Labs  09/24/14 1825 12/31/14 1300  PROT 8.2* 6.9   HEMOGLOBIN A1C No components found for: HGA1C,  MPG CARDIAC ENZYMES Lab Results  Component Value Date   TROPONINI <0.03 12/08/2014   TROPONINI <0.03 12/08/2014   TROPONINI <0.03 12/07/2014   BNP No results for input(s): PROBNP in the last 8760 hours. TSH No results for input(s): TSH in the last 8760 hours. CHOLESTEROL  Recent Labs  09/27/14 1100 12/07/14 0244  CHOL 241* 216*    Scheduled Meds: . carvedilol  6.25 mg Oral BID  . digoxin  125 mcg Oral Daily  . folic acid  1 mg  Oral Daily  . rivaroxaban  20 mg Oral Q supper  . simvastatin  20 mg Oral q1800  . sodium chloride  3 mL Intravenous Q12H   Continuous Infusions:  PRN Meds:.nitroGLYCERIN  Assessment/Plan: Dizziness Tachycardia-improved Chronic left heart systolic failure Atrial flutter/fibrillation with rapid ventricular response-CHA2DS2VASc score of 6/9 CAD, native vessel S/P Stent in RCA Chronic total occlusion of distal RCA Hypertension DM, II Obesity Left leg pain Defibrillator implantation history Paroxysmal atrial fibrillation H/O DVT   Increase activity.  Home in AM.     Dixie Dials  MD  01/03/2015, 6:33 PM

## 2015-01-03 NOTE — Progress Notes (Signed)
Rollater ( rolling walker with seat) delivered to the room today for discharge home today. Mindi Slicker [facility] [phone]

## 2015-01-04 DIAGNOSIS — R42 Dizziness and giddiness: Secondary | ICD-10-CM | POA: Diagnosis not present

## 2015-01-04 MED ORDER — AMIODARONE HCL 200 MG PO TABS
200.0000 mg | ORAL_TABLET | Freq: Every day | ORAL | Status: DC
  Administered 2015-01-04: 200 mg via ORAL
  Filled 2015-01-04: qty 1

## 2015-01-04 MED ORDER — AMIODARONE HCL 200 MG PO TABS: 200.0000 mg | ORAL_TABLET | Freq: Every day | ORAL | Status: DC

## 2015-01-04 NOTE — Discharge Summary (Signed)
Physician Discharge Summary  Patient ID: Shelley Douglas MRN: [id number] DOB/AGE: 64-Mar-1952 64 y.o.  Admit date: 12/31/2014 Discharge date: 01/04/2015  Admission Diagnoses: Dizziness Tachycardia Chronic left heart systolic failure Atrial flutter/fibrillation with rapid ventricular response-CHA2DS2VASc score of 6/9 CAD, native vessel S/P Stent in RCA Chronic total occlusion of distal RCA Hypertension DM, II Obesity Left leg pain Defibrillator implantation history H/O DVT  Discharge Diagnoses:  Principal Problem:   Dizziness Active Problems: Tachycardia-improved Chronic left heart systolic failure Atrial flutter/fibrillation with rapid ventricular response-CHA2DS2VASc score of 6/9 CAD, native vessel S/P Stent in RCA Chronic total occlusion of distal RCA Hypertension DM, II Obesity Left leg pain Defibrillator implantation history H/O DVT  Discharged Condition: fair  Hospital Course: 64 year old female had dizziness and palpitations x 2 days. No chest pain or leg edema or shortness of breath. EKG showed atrial flutter with variable AV block and rapid ventricular response. She responded to amiodarone and IV lasix. She will have close follow up for her ICD battery replacement. She will be followed by me in 1 week.   Consults: cardiology  Significant Diagnostic Studies: labs: Normal CBC and near normal BMET.   EKG-Atrial flutter/fibrillation with variable AV block  Treatments: cardiac meds: carvedilol, digoxin, furosemide, simvastatin, amiodarone and Xarelto  Discharge Exam: Blood pressure 134/82, pulse 71, temperature 98 F (36.7 C), temperature source Oral, resp. rate 18, height 5\' 7"  (1.702 m), weight 97.75 kg (215 lb 8 oz), SpO2 98 %.   Disposition: 01-Home or Self Care     Medication List    TAKE these medications        amiodarone 200 MG tablet  Commonly known as:  PACERONE  Take 1 tablet (200 mg total) by mouth daily.     carvedilol 6.25 MG  tablet  Commonly known as:  COREG  Take 6.25 mg by mouth 2 (two) times daily.     digoxin 0.125 MG tablet  Commonly known as:  LANOXIN  Take 125 mcg by mouth daily.     folic acid 1 MG tablet  Commonly known as:  FOLVITE  Take 1 mg by mouth daily.     furosemide 40 MG tablet  Commonly known as:  LASIX  TAKE ONE TABLET BY MOUTH EVERY DAY     nitroGLYCERIN 0.4 MG SL tablet  Commonly known as:  NITROSTAT  Place 1 tablet (0.4 mg total) under the tongue every 5 (five) minutes x 3 doses as needed for chest pain.     simvastatin 20 MG tablet  Commonly known as:  ZOCOR  Take 20 mg by mouth daily.     XARELTO 20 MG Tabs tablet  Generic drug:  rivaroxaban  Take 20 mg by mouth at bedtime.           Follow-up Information    Follow up with [facility], MD. Schedule an appointment as soon as possible for a visit in 1 month.   Specialty:  Internal Medicine   Contact information:   [address]. [place] [address] [phone]       Follow up with [facility], MD. Schedule an appointment as soon as possible for a visit in 1 week.   Specialty:  Cardiology   Contact information:   [place] [address] [phone]       Signed: Birdie Riddle 01/04/2015, 9:07 AM

## 2015-06-20 ENCOUNTER — Encounter: Payer: Self-pay | Admitting: Internal Medicine

## 2015-06-20 ENCOUNTER — Ambulatory Visit (INDEPENDENT_AMBULATORY_CARE_PROVIDER_SITE_OTHER): Payer: Medicare HMO | Admitting: Internal Medicine

## 2015-06-20 VITALS — BP 142/80 | HR 91 | Ht 66.5 in | Wt 200.6 lb

## 2015-06-20 DIAGNOSIS — I5022 Chronic systolic (congestive) heart failure: Secondary | ICD-10-CM | POA: Diagnosis not present

## 2015-06-20 DIAGNOSIS — I4819 Other persistent atrial fibrillation: Secondary | ICD-10-CM

## 2015-06-20 DIAGNOSIS — I481 Persistent atrial fibrillation: Secondary | ICD-10-CM | POA: Diagnosis not present

## 2015-06-20 DIAGNOSIS — I255 Ischemic cardiomyopathy: Secondary | ICD-10-CM | POA: Diagnosis not present

## 2015-06-20 LAB — BASIC METABOLIC PANEL
BUN: 10 mg/dL (ref 7–25)
CALCIUM: 10.8 mg/dL — AB (ref 8.6–10.4)
CO2: 29 mmol/L (ref 20–31)
CREATININE: 0.93 mg/dL (ref 0.50–0.99)
Chloride: 106 mmol/L (ref 98–110)
GLUCOSE: 111 mg/dL — AB (ref 65–99)
Potassium: 5.4 mmol/L — ABNORMAL HIGH (ref 3.5–5.3)
SODIUM: 139 mmol/L (ref 135–146)

## 2015-06-20 LAB — CBC WITH DIFFERENTIAL/PLATELET
BASOS ABS: 32 {cells}/uL (ref 0–200)
Basophils Relative: 1 %
EOS PCT: 4 %
Eosinophils Absolute: 128 cells/uL (ref 15–500)
HCT: 38.1 % (ref 35.0–45.0)
HEMOGLOBIN: 12.5 g/dL (ref 11.7–15.5)
LYMPHS PCT: 41 %
Lymphs Abs: 1312 cells/uL (ref 850–3900)
MCH: 28.1 pg (ref 27.0–33.0)
MCHC: 32.8 g/dL (ref 32.0–36.0)
MCV: 85.6 fL (ref 80.0–100.0)
MONOS PCT: 17 %
MPV: 10.6 fL (ref 7.5–12.5)
Monocytes Absolute: 544 cells/uL (ref 200–950)
NEUTROS PCT: 37 %
Neutro Abs: 1184 cells/uL — ABNORMAL LOW (ref 1500–7800)
Platelets: 186 10*3/uL (ref 140–400)
RBC: 4.45 MIL/uL (ref 3.80–5.10)
RDW: 15.6 % — AB (ref 11.0–15.0)
WBC: 3.2 10*3/uL — ABNORMAL LOW (ref 3.8–10.8)

## 2015-06-20 LAB — CUP PACEART INCLINIC DEVICE CHECK
Battery Voltage: 2.6 V
Brady Statistic AP VP Percent: 11.69 %
Brady Statistic AS VS Percent: 84.89 %
HighPow Impedance: 40 Ohm
HighPow Impedance: 49 Ohm
Implantable Lead Implant Date: 20090720
Implantable Lead Implant Date: 20090720
Implantable Lead Location: 753859
Lead Channel Impedance Value: 418 Ohm
Lead Channel Impedance Value: 532 Ohm
Lead Channel Pacing Threshold Pulse Width: 0.4 ms
Lead Channel Sensing Intrinsic Amplitude: 4.5 mV
Lead Channel Setting Pacing Amplitude: 2 V
Lead Channel Setting Pacing Amplitude: 2.5 V
Lead Channel Setting Pacing Pulse Width: 0.4 ms
MDC IDC LEAD LOCATION: 753860
MDC IDC MSMT LEADCHNL RA PACING THRESHOLD AMPLITUDE: 1 V
MDC IDC MSMT LEADCHNL RA PACING THRESHOLD PULSEWIDTH: 0.4 ms
MDC IDC MSMT LEADCHNL RV PACING THRESHOLD AMPLITUDE: 0.75 V
MDC IDC MSMT LEADCHNL RV SENSING INTR AMPL: 5.8 mV
MDC IDC SESS DTM: 20170616122815
MDC IDC SET LEADCHNL RV SENSING SENSITIVITY: 0.3 mV
MDC IDC STAT BRADY AP VS PERCENT: 3.4 %
MDC IDC STAT BRADY AS VP PERCENT: 0.01 %
MDC IDC STAT BRADY RA PERCENT PACED: 15.1 %
MDC IDC STAT BRADY RV PERCENT PACED: 11.71 %

## 2015-06-20 MED ORDER — LISINOPRIL 5 MG PO TABS: 5.0000 mg | ORAL_TABLET | Freq: Every day | ORAL | Status: DC

## 2015-06-20 MED ORDER — NITROGLYCERIN 0.4 MG SL SUBL: 0.4000 mg | SUBLINGUAL_TABLET | SUBLINGUAL | Status: DC | PRN

## 2015-06-20 NOTE — Patient Instructions (Addendum)
Medication Instructions:  Your physician has recommended you make the following change in your medication:  1) Start Lisinopril 5 mg daily   Labwork: Your physician recommends that you return for lab work today: BMP/CBC   Testing/Procedures: Your physician has recommended that you have a defibrillator inserted. An implantable cardioverter defibrillator (ICD) is a small device that is placed in your chest or, in rare cases, your abdomen. This device uses electrical pulses or shocks to help control life-threatening, irregular heartbeats that could lead the heart to suddenly stop beating (sudden cardiac arrest). Leads are attached to the ICD that goes into your heart. This is done in the hospital and usually requires an overnight stay. Please see the instruction sheet given to you today for more information.  Please arrive at the [facility] on 07/01/15 at 8:30am Do Not eat or drink after midnight the night prior to the procedure Do not take any medications the morning of the procedure Plan for one night stay Use cleanse scrub prior to procedure(see instructions given) Hold Xarelto 2 days prior    Follow-Up: Your physician recommends that you schedule a follow-up appointment in: 10-14 days from 07/01/15 in device clinic for wound check and 3 months from 07/01/15 with Dr Rayann Heman   Any Other Special Instructions Will Be Listed Below (If Applicable).     If you need a refill on your cardiac medications before your next appointment, please call your pharmacy.

## 2015-06-20 NOTE — Progress Notes (Signed)
6   Electrophysiology Office Note   Date:  06/20/2015   ID:  Shelley Douglas, DOB December 14, 1950, MRN [id number]  PCP:  Barbette Merino, MD  Cardiologist:  Dr Doylene Canard Primary Electrophysiologist: Thompson Grayer, MD    Chief Complaint  Patient presents with  . Atrial Fibrillation     History of Present Illness: Shelley Douglas is a 65 y.o. female who presents today for electrophysiology evaluation.   The patient has an ischemic CM (EF 20%), NYHA Class III symptoms, CAD, and LBBB (QRS 142 msec) who presents to re-establish in the EP clinic.  She underwent ICD implantation in [place] in 2009 for primary prevention (records not available).  She reports having appropriate therapy in 2010.  She was last seen by Dr Caryl Comes in 2012.  She has not had EP care since that time.  She follows with Dr Doylene Canard for routine cardiology care.  She has reached ERI on her ICD and is referred for further evaluation. Today, she denies symptoms of chest pain, lower extremity edema, claudication, dizziness, presyncope, syncope, bleeding, or neurologic sequela.   Rare palpitations and bruising.  She reports that she can walk 100 ft without difficulty but becomes short of breath if she walks further.  She is unable to climb stairs due to SOB.  She has 4 pillow orthpnea and occasional PND.  The patient is tolerating medications without difficulties and is otherwise without complaint today.    Past Medical History  Diagnosis Date  . Persistent atrial fibrillation ([place])   . CAD (coronary artery disease)     last cath 2016  . Mitral valve regurgitation   . DM (diabetes mellitus) ([place])   . Hyperthyroidism   . Stroke ([place]) 2001  . Ischemic cardiomyopathy   . Anxiety   . Obesity   . Pulmonary embolism ([place]) 1993  . DVT (deep venous thrombosis) ([place]) 1996  . Left bundle branch block   . Diabetes [facility])    Past Surgical History  Procedure Laterality Date  . Coronary angioplasty with stent placement    . Coronary artery  bypass graft    . Cardiac defibrillator placement  07/24/2007    Medtronic Secura DR implanted in [place] for primary prevention  . Vena cava filter placement    . Cardiac catheterization N/A 09/27/2014    Procedure: Left Heart Cath and Coronary Angiography;  Surgeon: Dixie Dials, MD;  Location: [place] [facility];  Service: Cardiovascular;  Laterality: N/A;     Current Outpatient Prescriptions  Medication Sig Dispense Refill  . amiodarone (PACERONE) 200 MG tablet Take 1 tablet (200 mg total) by mouth daily. 30 tablet 3  . carvedilol (COREG) 6.25 MG tablet Take 6.25 mg by mouth 2 (two) times daily.    . digoxin (LANOXIN) 0.125 MG tablet Take 125 mcg by mouth daily.     . folic acid (FOLVITE) 1 MG tablet Take 1 mg by mouth daily.     . furosemide (LASIX) 40 MG tablet Take 40 mg by mouth daily as needed (swelling).    . nitroGLYCERIN (NITROSTAT) 0.4 MG SL tablet Place 1 tablet (0.4 mg total) under the tongue every 5 (five) minutes x 3 doses as needed for chest pain. 25 tablet 1  . simvastatin (ZOCOR) 20 MG tablet Take 20 mg by mouth daily.     Alveda Reasons 20 MG TABS tablet Take 20 mg by mouth at bedtime.     No current facility-administered medications for this visit.    Allergies:  Clopidogrel bisulfate   Social History:  The patient  reports that she quit smoking about 27 years ago. She has never used smokeless tobacco. She reports that she does not drink alcohol or use illicit drugs.   Family History:  The patient's mother had hypertension but lived to 28s.   ROS:  Please see the history of present illness.   All other systems are reviewed and negative.    PHYSICAL EXAM: VS:  BP 142/80 mmHg  Pulse 91  Ht 5' 6.5" (1.689 m)  Wt 200 lb 9.6 oz (90.992 kg)  BMI 31.90 kg/m2  SpO2 97% , BMI Body mass index is 31.9 kg/(m^2). GEN: Well nourished, well developed, in no acute distress HEENT: normal Neck: no JVD, carotid bruits, or masses Cardiac: RRR; no murmurs, rubs, or gallops,no  edema  Respiratory:  clear to auscultation bilaterally, normal work of breathing GI: soft, nontender, nondistended, + BS MS: no deformity or atrophy Skin: warm and dry, device pocket is well healed Neuro:  Strength and sensation are intact Psych: euthymic mood, full affect  EKG:  EKG is ordered today. The ekg ordered today shows sinus rhythm 91 bpm, LBBB (QRS 142 msec)  Device interrogation is reviewed today in detail.  See PaceArt for details.   Recent Labs: 12/07/2014: B Natriuretic Peptide 778.5* 12/31/2014: ALT 93*; Hemoglobin 13.6; Platelets 216 01/03/2015: BUN 19; Creatinine, Ser 1.24*; Potassium 4.2; Sodium 139    Lipid Panel     Component Value Date/Time   CHOL 216* 12/07/2014 0244   TRIG 97 12/07/2014 0244   HDL 55 12/07/2014 0244   CHOLHDL 3.9 12/07/2014 0244   VLDL 19 12/07/2014 0244   LDLCALC 142* 12/07/2014 0244     Wt Readings from Last 3 Encounters:  06/20/15 200 lb 9.6 oz (90.992 kg)  01/04/15 215 lb 8 oz (97.75 kg)  12/08/14 202 lb 9.6 oz (91.899 kg)      Other studies Reviewed: Additional studies/ records that were reviewed today include: epic records, cath/ echo 2016  Review of the above records today demonstrates: EF 20%   ASSESSMENT AND PLAN:  1.  Ischemic CM (EF 20%), NYHA Class III CHF, LBBB ICD interrogation reviewed.  She has reached ERI. Given CHF and LBBB with QRS > 130 msec, I have discussed upgrade to CRT today. Risks, benefits, alternatives to BIV ICD upgrade were discussed in detail with the patient today. The patient  understands that the risks include but are not limited to bleeding, infection, pneumothorax, perforation, tamponade, vascular damage, renal failure, MI, stroke, death, inappropriate shocks, and lead dislodgement and wishes to proceed.  We will therefore schedule device implantation at the next available time. She will hold xarelto 24 hours prior to the procedure  2. Persistent afib Currently controlled with  amidoarone Labs followed by Dr Doylene Canard On xarelto chads2vasc score is at least 8.  Hold xarelto for only 24 hours prior to upgrade of ICD  3. Prior PTE/ DVT On xarelto  Current medicines are reviewed at length with the patient today.   The patient does not have concerns regarding her medicines.  The following changes were made today:  none    Signed, Thompson Grayer, MD  06/20/2015 9:55 AM     [place] [address] [place]  [address] [phone] (office) [phone] (fax)

## 2015-07-01 ENCOUNTER — Ambulatory Visit (HOSPITAL_COMMUNITY): Payer: Medicare HMO

## 2015-07-01 ENCOUNTER — Encounter (HOSPITAL_COMMUNITY)
Admission: RE | Disposition: A | Discharge status: Home / Self Care | Payer: Self-pay | Source: Ambulatory Visit | Attending: Internal Medicine

## 2015-07-01 ENCOUNTER — Encounter (HOSPITAL_COMMUNITY): Payer: Self-pay | Admitting: Internal Medicine

## 2015-07-01 ENCOUNTER — Ambulatory Visit (HOSPITAL_COMMUNITY)
Admission: RE | Admit: 2015-07-01 | Discharge: 2015-07-01 | Disposition: A | Discharge status: Home / Self Care | Payer: Medicare HMO | Source: Ambulatory Visit | Attending: Internal Medicine | Admitting: Internal Medicine

## 2015-07-01 DIAGNOSIS — I481 Persistent atrial fibrillation: Secondary | ICD-10-CM | POA: Insufficient documentation

## 2015-07-01 DIAGNOSIS — I5022 Chronic systolic (congestive) heart failure: Secondary | ICD-10-CM | POA: Diagnosis present

## 2015-07-01 DIAGNOSIS — Z95810 Presence of automatic (implantable) cardiac defibrillator: Secondary | ICD-10-CM | POA: Diagnosis not present

## 2015-07-01 DIAGNOSIS — E119 Type 2 diabetes mellitus without complications: Secondary | ICD-10-CM | POA: Insufficient documentation

## 2015-07-01 DIAGNOSIS — I4891 Unspecified atrial fibrillation: Secondary | ICD-10-CM | POA: Diagnosis present

## 2015-07-01 DIAGNOSIS — I110 Hypertensive heart disease with heart failure: Secondary | ICD-10-CM | POA: Insufficient documentation

## 2015-07-01 DIAGNOSIS — Z86711 Personal history of pulmonary embolism: Secondary | ICD-10-CM | POA: Diagnosis not present

## 2015-07-01 DIAGNOSIS — Z959 Presence of cardiac and vascular implant and graft, unspecified: Secondary | ICD-10-CM

## 2015-07-01 DIAGNOSIS — Z86718 Personal history of other venous thrombosis and embolism: Secondary | ICD-10-CM | POA: Diagnosis not present

## 2015-07-01 DIAGNOSIS — I255 Ischemic cardiomyopathy: Secondary | ICD-10-CM | POA: Diagnosis not present

## 2015-07-01 DIAGNOSIS — Z87891 Personal history of nicotine dependence: Secondary | ICD-10-CM | POA: Insufficient documentation

## 2015-07-01 DIAGNOSIS — I509 Heart failure, unspecified: Secondary | ICD-10-CM | POA: Insufficient documentation

## 2015-07-01 DIAGNOSIS — Z955 Presence of coronary angioplasty implant and graft: Secondary | ICD-10-CM | POA: Insufficient documentation

## 2015-07-01 DIAGNOSIS — I519 Heart disease, unspecified: Secondary | ICD-10-CM | POA: Diagnosis present

## 2015-07-01 DIAGNOSIS — Z8673 Personal history of transient ischemic attack (TIA), and cerebral infarction without residual deficits: Secondary | ICD-10-CM | POA: Diagnosis not present

## 2015-07-01 DIAGNOSIS — I2510 Atherosclerotic heart disease of native coronary artery without angina pectoris: Secondary | ICD-10-CM | POA: Insufficient documentation

## 2015-07-01 DIAGNOSIS — I447 Left bundle-branch block, unspecified: Secondary | ICD-10-CM | POA: Diagnosis not present

## 2015-07-01 DIAGNOSIS — E0590 Thyrotoxicosis, unspecified without thyrotoxic crisis or storm: Secondary | ICD-10-CM | POA: Insufficient documentation

## 2015-07-01 DIAGNOSIS — Z7901 Long term (current) use of anticoagulants: Secondary | ICD-10-CM | POA: Insufficient documentation

## 2015-07-01 HISTORY — PX: EP IMPLANTABLE DEVICE: SHX172B

## 2015-07-01 LAB — SURGICAL PCR SCREEN
MRSA, PCR: NEGATIVE
STAPHYLOCOCCUS AUREUS: POSITIVE — AB

## 2015-07-01 LAB — GLUCOSE, CAPILLARY
GLUCOSE-CAPILLARY: 86 mg/dL (ref 65–99)
Glucose-Capillary: 103 mg/dL — ABNORMAL HIGH (ref 65–99)
Glucose-Capillary: 83 mg/dL (ref 65–99)

## 2015-07-01 SURGERY — BIV UPGRADE

## 2015-07-01 MED ORDER — FENTANYL CITRATE (PF) 100 MCG/2ML IJ SOLN
INTRAMUSCULAR | Status: AC
  Filled 2015-07-01: qty 2

## 2015-07-01 MED ORDER — FENTANYL CITRATE (PF) 100 MCG/2ML IJ SOLN
INTRAMUSCULAR | Status: DC | PRN
  Administered 2015-07-01: 25 ug via INTRAVENOUS
  Administered 2015-07-01: 12.5 ug via INTRAVENOUS

## 2015-07-01 MED ORDER — SODIUM CHLORIDE 0.9 % IR SOLN
Status: AC
  Filled 2015-07-01: qty 2

## 2015-07-01 MED ORDER — LIDOCAINE HCL (PF) 1 % IJ SOLN
INTRAMUSCULAR | Status: AC
  Filled 2015-07-01: qty 60

## 2015-07-01 MED ORDER — SODIUM CHLORIDE 0.9 % IR SOLN
80.0000 mg | Status: AC
  Administered 2015-07-01: 80 mg

## 2015-07-01 MED ORDER — LISINOPRIL 5 MG PO TABS: 5.0000 mg | ORAL_TABLET | Freq: Every day | ORAL | Status: DC

## 2015-07-01 MED ORDER — LIDOCAINE HCL (PF) 1 % IJ SOLN
INTRAMUSCULAR | Status: AC
  Filled 2015-07-01: qty 30

## 2015-07-01 MED ORDER — HEPARIN (PORCINE) IN NACL 2-0.9 UNIT/ML-% IJ SOLN
INTRAMUSCULAR | Status: DC | PRN
  Administered 2015-07-01: 10:00:00

## 2015-07-01 MED ORDER — CEFAZOLIN SODIUM-DEXTROSE 2-4 GM/100ML-% IV SOLN
2.0000 g | INTRAVENOUS | Status: AC
  Administered 2015-07-01: 2 g via INTRAVENOUS

## 2015-07-01 MED ORDER — MUPIROCIN 2 % EX OINT
TOPICAL_OINTMENT | CUTANEOUS | Status: AC
  Administered 2015-07-01: 1
  Filled 2015-07-01: qty 22

## 2015-07-01 MED ORDER — MIDAZOLAM HCL 5 MG/5ML IJ SOLN
INTRAMUSCULAR | Status: AC
  Filled 2015-07-01: qty 5

## 2015-07-01 MED ORDER — CARVEDILOL 12.5 MG PO TABS: 12.5000 mg | ORAL_TABLET | Freq: Two times a day (BID) | ORAL | Status: DC

## 2015-07-01 MED ORDER — CEFAZOLIN SODIUM-DEXTROSE 2-4 GM/100ML-% IV SOLN
INTRAVENOUS | Status: AC
  Filled 2015-07-01: qty 100

## 2015-07-01 MED ORDER — CEFAZOLIN IN D5W 1 GM/50ML IV SOLN
1.0000 g | Freq: Four times a day (QID) | INTRAVENOUS | Status: DC
  Filled 2015-07-01 (×3): qty 50

## 2015-07-01 MED ORDER — SODIUM CHLORIDE 0.9 % IV SOLN
INTRAVENOUS | Status: DC
  Administered 2015-07-01: 10:00:00 via INTRAVENOUS

## 2015-07-01 MED ORDER — MIDAZOLAM HCL 5 MG/5ML IJ SOLN
INTRAMUSCULAR | Status: DC | PRN
  Administered 2015-07-01 (×3): 1 mg via INTRAVENOUS

## 2015-07-01 MED ORDER — ACETAMINOPHEN 325 MG PO TABS: 325.0000 mg | ORAL_TABLET | ORAL | Status: DC | PRN

## 2015-07-01 MED ORDER — IOPAMIDOL (ISOVUE-370) INJECTION 76%
INTRAVENOUS | Status: DC | PRN
  Administered 2015-07-01: 15 mL
  Administered 2015-07-01: 15 mL via INTRAVENOUS

## 2015-07-01 MED ORDER — IOPAMIDOL (ISOVUE-370) INJECTION 76%
INTRAVENOUS | Status: AC
  Filled 2015-07-01: qty 100

## 2015-07-01 MED ORDER — ONDANSETRON HCL 4 MG/2ML IJ SOLN: 4.0000 mg | Freq: Four times a day (QID) | INTRAMUSCULAR | Status: DC | PRN

## 2015-07-01 MED ORDER — SODIUM CHLORIDE 0.9% FLUSH: 3.0000 mL | INTRAVENOUS | Status: DC | PRN

## 2015-07-01 MED ORDER — SODIUM CHLORIDE 0.9 % IV SOLN: 250.0000 mL | INTRAVENOUS | Status: DC | PRN

## 2015-07-01 MED ORDER — SODIUM CHLORIDE 0.9% FLUSH: 3.0000 mL | Freq: Two times a day (BID) | INTRAVENOUS | Status: DC

## 2015-07-01 MED ORDER — CHLORHEXIDINE GLUCONATE 4 % EX LIQD
60.0000 mL | Freq: Once | CUTANEOUS | Status: DC
  Filled 2015-07-01: qty 60

## 2015-07-01 MED ORDER — HEPARIN (PORCINE) IN NACL 2-0.9 UNIT/ML-% IJ SOLN
INTRAMUSCULAR | Status: AC
  Filled 2015-07-01: qty 500

## 2015-07-01 MED ORDER — LIDOCAINE HCL (PF) 1 % IJ SOLN
INTRAMUSCULAR | Status: DC | PRN
  Administered 2015-07-01: 35 mL via INTRADERMAL

## 2015-07-01 MED ORDER — HYDROCODONE-ACETAMINOPHEN 5-325 MG PO TABS: 1.0000 | ORAL_TABLET | ORAL | Status: DC | PRN

## 2015-07-01 SURGICAL SUPPLY — 14 items
ADAPTER SEALING SSA-EW-09 (MISCELLANEOUS) ×2 IMPLANT
BALLN ATTAIN 80 (BALLOONS) ×2
BALLOON ATTAIN 80 (BALLOONS) ×1 IMPLANT
CABLE SURGICAL S-101-97-12 (CABLE) ×2 IMPLANT
CATH ATTAIN COM SURV 6250V-MB2 (CATHETERS) ×2 IMPLANT
CATH HEXAPOLAR DAMATO 6F (CATHETERS) ×2 IMPLANT
ICD VIVA QUAD XT CRT-D DTBA1Q1 (ICD Generator) ×2 IMPLANT
KIT ESSENTIALS PG (KITS) ×2 IMPLANT
LEAD ATTAIN PERFORMA S 4598-88 (Lead) ×2 IMPLANT
PAD DEFIB LIFELINK (PAD) ×2 IMPLANT
SHEATH CLASSIC 9.5F (SHEATH) ×2 IMPLANT
SLITTER 6232ADJ (MISCELLANEOUS) ×2 IMPLANT
TRAY PACEMAKER INSERTION (PACKS) ×2 IMPLANT
WIRE ACUITY WHISPER EDS 4648 (WIRE) ×2 IMPLANT

## 2015-07-01 NOTE — Care Management Note (Signed)
Case Management Note  Patient Details  Name: Shelley Douglas MRN: [id number] Date of Birth: [birth year]  Subjective/Objective:      Pacemaker Update              Action/Plan: Discharge Planning:  NCM spoke to pt at bedside. Pt states she does receive disability and she has insurance for her medications. States her copay for Xarelto $47 and states she is on a fixed income. Provided pt with contact info for Xarelto to contact Patient Support Program to see if she qualifies for copay assistance. Provided pt with info on [employer] program designed to help with copays.   PCP -Elwyn Reach MD   Expected Discharge Date:                  Expected Discharge Plan:  Home/Self Care  In-House Referral:  NA  Discharge planning Services  CM Consult, Medication Assistance  Post Acute Care Choice:  NA Choice offered to:  NA  DME Arranged:  N/A DME Agency:  NA  HH Arranged:  NA HH Agency:  NA  Status of Service:  Completed, signed off  If discussed at [place] of Stay Meetings, dates discussed:    Additional Comments:  Erenest Rasher, RN 07/01/2015, 4:07 PM

## 2015-07-01 NOTE — H&P (View-Only) (Signed)
6   Electrophysiology Office Note   Date:  06/20/2015   ID:  Shelley Douglas, DOB 1950-03-11, MRN [id number]  PCP:  Barbette Merino, MD  Cardiologist:  Dr Doylene Canard Primary Electrophysiologist: Thompson Grayer, MD    Chief Complaint  Patient presents with  . Atrial Fibrillation     History of Present Illness: Shelley Douglas is a 65 y.o. female who presents today for electrophysiology evaluation.   The patient has an ischemic CM (EF 20%), NYHA Class III symptoms, CAD, and LBBB (QRS 142 msec) who presents to re-establish in the EP clinic.  She underwent ICD implantation in [place] in 2009 for primary prevention (records not available).  She reports having appropriate therapy in 2010.  She was last seen by Dr Caryl Comes in 2012.  She has not had EP care since that time.  She follows with Dr Doylene Canard for routine cardiology care.  She has reached ERI on her ICD and is referred for further evaluation. Today, she denies symptoms of chest pain, lower extremity edema, claudication, dizziness, presyncope, syncope, bleeding, or neurologic sequela.   Rare palpitations and bruising.  She reports that she can walk 100 ft without difficulty but becomes short of breath if she walks further.  She is unable to climb stairs due to SOB.  She has 4 pillow orthpnea and occasional PND.  The patient is tolerating medications without difficulties and is otherwise without complaint today.    Past Medical History  Diagnosis Date  . Persistent atrial fibrillation ([place])   . CAD (coronary artery disease)     last cath 2016  . Mitral valve regurgitation   . DM (diabetes mellitus) ([place])   . Hyperthyroidism   . Stroke ([place]) 2001  . Ischemic cardiomyopathy   . Anxiety   . Obesity   . Pulmonary embolism ([place]) 1993  . DVT (deep venous thrombosis) ([place]) 1996  . Left bundle branch block   . Diabetes [facility])    Past Surgical History  Procedure Laterality Date  . Coronary angioplasty with stent placement    . Coronary artery  bypass graft    . Cardiac defibrillator placement  07/24/2007    Medtronic Secura DR implanted in [place] for primary prevention  . Vena cava filter placement    . Cardiac catheterization N/A 09/27/2014    Procedure: Left Heart Cath and Coronary Angiography;  Surgeon: Dixie Dials, MD;  Location: [place] CV LAB;  Service: Cardiovascular;  Laterality: N/A;     Current Outpatient Prescriptions  Medication Sig Dispense Refill  . amiodarone (PACERONE) 200 MG tablet Take 1 tablet (200 mg total) by mouth daily. 30 tablet 3  . carvedilol (COREG) 6.25 MG tablet Take 6.25 mg by mouth 2 (two) times daily.    . digoxin (LANOXIN) 0.125 MG tablet Take 125 mcg by mouth daily.     . folic acid (FOLVITE) 1 MG tablet Take 1 mg by mouth daily.     . furosemide (LASIX) 40 MG tablet Take 40 mg by mouth daily as needed (swelling).    . nitroGLYCERIN (NITROSTAT) 0.4 MG SL tablet Place 1 tablet (0.4 mg total) under the tongue every 5 (five) minutes x 3 doses as needed for chest pain. 25 tablet 1  . simvastatin (ZOCOR) 20 MG tablet Take 20 mg by mouth daily.     Alveda Reasons 20 MG TABS tablet Take 20 mg by mouth at bedtime.     No current facility-administered medications for this visit.    Allergies:  Clopidogrel bisulfate   Social History:  The patient  reports that she quit smoking about 27 years ago. She has never used smokeless tobacco. She reports that she does not drink alcohol or use illicit drugs.   Family History:  The patient's mother had hypertension but lived to 75s.   ROS:  Please see the history of present illness.   All other systems are reviewed and negative.    PHYSICAL EXAM: VS:  BP 142/80 mmHg  Pulse 91  Ht 5' 6.5" (1.689 m)  Wt 200 lb 9.6 oz (90.992 kg)  BMI 31.90 kg/m2  SpO2 97% , BMI Body mass index is 31.9 kg/(m^2). GEN: Well nourished, well developed, in no acute distress HEENT: normal Neck: no JVD, carotid bruits, or masses Cardiac: RRR; no murmurs, rubs, or gallops,no  edema  Respiratory:  clear to auscultation bilaterally, normal work of breathing GI: soft, nontender, nondistended, + BS MS: no deformity or atrophy Skin: warm and dry, device pocket is well healed Neuro:  Strength and sensation are intact Psych: euthymic mood, full affect  EKG:  EKG is ordered today. The ekg ordered today shows sinus rhythm 91 bpm, LBBB (QRS 142 msec)  Device interrogation is reviewed today in detail.  See PaceArt for details.   Recent Labs: 12/07/2014: B Natriuretic Peptide 778.5* 12/31/2014: ALT 93*; Hemoglobin 13.6; Platelets 216 01/03/2015: BUN 19; Creatinine, Ser 1.24*; Potassium 4.2; Sodium 139    Lipid Panel     Component Value Date/Time   CHOL 216* 12/07/2014 0244   TRIG 97 12/07/2014 0244   HDL 55 12/07/2014 0244   CHOLHDL 3.9 12/07/2014 0244   VLDL 19 12/07/2014 0244   LDLCALC 142* 12/07/2014 0244     Wt Readings from Last 3 Encounters:  06/20/15 200 lb 9.6 oz (90.992 kg)  01/04/15 215 lb 8 oz (97.75 kg)  12/08/14 202 lb 9.6 oz (91.899 kg)      Other studies Reviewed: Additional studies/ records that were reviewed today include: epic records, cath/ echo 2016  Review of the above records today demonstrates: EF 20%   ASSESSMENT AND PLAN:  1.  Ischemic CM (EF 20%), NYHA Class III CHF, LBBB ICD interrogation reviewed.  She has reached ERI. Given CHF and LBBB with QRS > 130 msec, I have discussed upgrade to CRT today. Risks, benefits, alternatives to BIV ICD upgrade were discussed in detail with the patient today. The patient  understands that the risks include but are not limited to bleeding, infection, pneumothorax, perforation, tamponade, vascular damage, renal failure, MI, stroke, death, inappropriate shocks, and lead dislodgement and wishes to proceed.  We will therefore schedule device implantation at the next available time. She will hold xarelto 24 hours prior to the procedure  2. Persistent afib Currently controlled with  amidoarone Labs followed by Dr Doylene Canard On xarelto chads2vasc score is at least 8.  Hold xarelto for only 24 hours prior to upgrade of ICD  3. Prior PTE/ DVT On xarelto  Current medicines are reviewed at length with the patient today.   The patient does not have concerns regarding her medicines.  The following changes were made today:  none    Signed, Thompson Grayer, MD  06/20/2015 9:55 AM     [address]  [place] [address] [phone] (office) [phone] (fax)

## 2015-07-01 NOTE — Discharge Instructions (Signed)
° ° °  Supplemental Discharge Instructions for  Pacemaker/Defibrillator Patients  Activity No heavy lifting or vigorous activity with your left/right arm for 6 to 8 weeks.  Do not raise your left/right arm above your head for one week.  Gradually raise your affected arm as drawn below.              07/05/15                      07/06/15                      07/07/15                     07/08/15 __  NO DRIVING for  1 week   ; you may begin driving on  [id number]   .  WOUND CARE - Keep the wound area clean and dry.  Do not get this area wet. No showers until cleared at your wound check appointment . - The tape/steri-strips on your wound will fall off; do not pull them off.  No bandage is needed on the site.  DO  NOT apply any creams, oils, or ointments to the wound area. - If you notice any drainage or discharge from the wound, any swelling or bruising at the site, or you develop a fever > 101? F after you are discharged home, call the office at once.  Special Instructions - You are still able to use cellular telephones; use the ear opposite the side where you have your pacemaker/defibrillator.  Avoid carrying your cellular phone near your device. - When traveling through airports, show security personnel your identification card to avoid being screened in the metal detectors.  Ask the security personnel to use the hand wand. - Avoid arc welding equipment, MRI testing (magnetic resonance imaging), TENS units (transcutaneous nerve stimulators).  Call the office for questions about other devices. - Avoid electrical appliances that are in poor condition or are not properly grounded. - Microwave ovens are safe to be near or to operate.  Additional information for defibrillator patients should your device go off: - If your device goes off ONCE and you feel fine afterward, notify the device clinic nurses. - If your device goes off ONCE and you do not feel well afterward, call 911. - If your device goes off  TWICE, call 911. - If your device goes off THREE times in one day, call 911.  DO NOT DRIVE YOURSELF OR A FAMILY MEMBER WITH A DEFIBRILLATOR TO THE HOSPITAL--CALL 911.

## 2015-07-01 NOTE — Progress Notes (Signed)
Orthopedic Tech Progress Note Patient Details:  Shelley Douglas 1950-08-13 [id number]  Ortho Devices Type of Ortho Device: Arm sling   [place] Pink 07/01/2015, 12:18 PM

## 2015-07-01 NOTE — Discharge Summary (Signed)
ELECTROPHYSIOLOGY PROCEDURE DISCHARGE SUMMARY    Patient ID: CALYSE GUERRERO,  MRN: [id number], DOB/AGE: 65/22/1952 65 y.o.  Admit date: 07/01/2015 Discharge date: 07/01/15  Primary Care Physician: Barbette Merino, MD Primary Cardiologist: Dr. Doylene Canard Electrophysiologist: Dr. Rayann Heman  Primary Discharge Diagnosis:  1. ICM, class III CHF     ICD at elective replacement interval     s/p ICD upgrade to CRT-D this admission  Secondary Discharge Diagnosis:  1. CAD 2. LBBB 3. persistent Afib     CHA2Ds2Vasc is at least 7 on xarelto 4. HTN 5. DM   Allergies  Allergen Reactions  . Clopidogrel Bisulfate Rash     Procedures This Admission:  1.  Upgrade of her ICD to a MDT CRT- ICD on 07/01/15 by Dr Rayann Heman.  The patient received a Medtronic model [id number] Hillery Aldo XT CRT-D (serial Number [id number]) device and a Medtronic model [id number] - 88 (serial number [id number] ) LV lead.   DFT's were deferred at time of implant.  There were no immediate post procedure complications. 2.  CXR on 07/01/15 demonstrated no pneumothorax status post device implantation.   Brief HPI: Shelley Douglas is a 65 y.o. female was referred to electrophysiology in the outpatient setting for consideration of ICD upgrade to CRT-D given her class III CHF and LBBB with her previously implanted ICD having reached ERI.  Past medical history includes ICM, CHF, CAD, AFib, HTN, DM.  Risks, benefits, and alternatives to ICD implantation were reviewed with the patient who wished to proceed.   Hospital Course:  The patient was admitted and underwent ICD upgrade to CRT-D with details as outlined above. She was monitored on telemetry which demonstrated sinus with BiV pacing.  Left chest was without hematoma or ecchymosis.  The device was interrogated and found to be functioning normally.  CXR was obtained and demonstrated no pneumothorax status post device implantation.  Wound care, arm mobility, and restrictions were reviewed  with the patient.  The patient was examined by Dr. Rayann Heman and considered stable for discharge to home.   Physical Exam: Filed Vitals:   07/01/15 1315 07/01/15 1330 07/01/15 1458 07/01/15 1500  BP: 146/132 143/78 146/79 146/79  Pulse: 84 81 80 76  Temp:   97.5 F (36.4 C)   TempSrc:   Axillary   Resp: 19 15 20 21   Height:      Weight:      SpO2: 97% 98% 100% 100%    GEN- The patient is well appearing, alert and oriented x 3 today.   HEENT: normocephalic, atraumatic; sclera clear, conjunctiva pink; hearing intact; oropharynx clear Lungs- Clear to ausculation bilaterally, normal work of breathing.  No wheezes, rales, rhonchi Heart- Regular rate and rhythm, no murmurs, rubs or gallops, PMI not laterally displaced GI- soft, non-tender, non-distended, bowel sounds present Extremities- no clubbing, cyanosis, or edema; DP/PT/radial pulses 2+ bilaterally MS- no significant deformity or atrophy Skin- warm and dry, no rash or lesion, left chest without hematoma/ecchymosis Psych- euthymic mood, full affect Neuro- no gross defecits  Labs:   Lab Results  Component Value Date   WBC 3.2* 06/20/2015   HGB 12.5 06/20/2015   HCT 38.1 06/20/2015   MCV 85.6 06/20/2015   PLT 186 06/20/2015   No results for input(s): NA, K, CL, CO2, BUN, CREATININE, CALCIUM, PROT, BILITOT, ALKPHOS, ALT, AST, GLUCOSE in the last 168 hours.  Invalid input(s): LABALBU  Discharge Medications:    Medication List    TAKE these medications  amiodarone 200 MG tablet  Commonly known as:  PACERONE  Take 1 tablet (200 mg total) by mouth daily.     carvedilol 12.5 MG tablet  Commonly known as:  COREG  Take 12.5 mg by mouth 2 (two) times daily with a meal.     digoxin 0.125 MG tablet  Commonly known as:  LANOXIN  Take 125 mcg by mouth daily.     folic acid 1 MG tablet  Commonly known as:  FOLVITE  Take 1 mg by mouth daily.     furosemide 40 MG tablet  Commonly known as:  LASIX  Take 40 mg by mouth  daily as needed (swelling).     lisinopril 5 MG tablet  Commonly known as:  PRINIVIL,ZESTRIL  Take 1 tablet (5 mg total) by mouth daily.     nitroGLYCERIN 0.4 MG SL tablet  Commonly known as:  NITROSTAT  Place 1 tablet (0.4 mg total) under the tongue every 5 (five) minutes x 3 doses as needed for chest pain.     simvastatin 20 MG tablet  Commonly known as:  ZOCOR  Take 20 mg by mouth daily.     XARELTO 20 MG Tabs tablet  Generic drug:  rivaroxaban  Take 20 mg by mouth at bedtime.        Disposition:  Home  Follow-up Information    Follow up with [facility] On 07/14/2015.   Specialty:  Cardiology   Why:  11:00AM, wound check   Contact information:   [address], [place] [phone]      Follow up with Thompson Grayer, MD On 10/06/2015.   Specialty:  Cardiology   Why:  9:30AM   Contact information:   [place] [address] [phone]       Duration of Discharge Encounter: Greater than 30 minutes including physician time.  Signed, Tommye Standard, PA-C 07/01/2015 5:30 PM   I have seen, examined the patient, and reviewed the above assessment and plan.  Changes to above are made where necessary.    Co Sign: Thompson Grayer, MD 07/01/2015 5:30 PM

## 2015-07-01 NOTE — Interval H&P Note (Signed)
History and Physical Interval Note:  07/01/2015 9:31 AM  Risks, benefits, and alternatives to BiV ICD pulse generator upgrade were discussed again in detail today.  The patient understands that risks include but are not limited to bleeding, infection, pneumothorax, perforation, tamponade, vascular damage, renal failure, MI, stroke, death, inappropriate shocks, damage to his existing leads, and lead dislodgement and wishes to proceed.   She has taken lisinopril previously but not recently.  Will restart lisinopril at this time.  ICD Criteria  Current LVEF:20%. Within 12 months prior to implant: Yes   Heart failure history: Yes, Class III  Cardiomyopathy history: Yes, Ischemic Cardiomyopathy.  Atrial Fibrillation/Atrial Flutter: Yes, Paroxysmal.  Ventricular tachycardia history: No.  Cardiac arrest history: No.  History of syndromes with risk of sudden death: No.  Previous ICD: Yes, Reason for ICD:  Primary prevention.  Current ICD indication: Secondary  PPM indication: No.   Class I or II Bradycardia indication present: Yes   LBBB with QRS > 130 msec.  Will upgrade to BiV ICD  Beta Blocker therapy for 3 or more months: Yes, prescribed.   Ace Inhibitor/ARB therapy for 3 or more months: Yes, prescribed.  will restart today    Shelley Douglas  has presented today for surgery, with the diagnosis of chf  The various methods of treatment have been discussed with the patient and family. After consideration of risks, benefits and other options for treatment, the patient has consented to  Procedure(s): BiV PPM Upgrade (N/A) as a surgical intervention .  The patient's history has been reviewed, patient examined, no change in status, stable for surgery.  I have reviewed the patient's chart and labs.  Questions were answered to the patient's satisfaction.     Thompson Grayer

## 2015-07-14 ENCOUNTER — Encounter: Payer: Self-pay | Admitting: Internal Medicine

## 2015-07-14 ENCOUNTER — Ambulatory Visit (INDEPENDENT_AMBULATORY_CARE_PROVIDER_SITE_OTHER): Payer: Medicare HMO | Admitting: *Deleted

## 2015-07-14 DIAGNOSIS — I5022 Chronic systolic (congestive) heart failure: Secondary | ICD-10-CM | POA: Diagnosis not present

## 2015-07-14 DIAGNOSIS — I255 Ischemic cardiomyopathy: Secondary | ICD-10-CM | POA: Diagnosis not present

## 2015-07-14 LAB — CUP PACEART INCLINIC DEVICE CHECK
Battery Remaining Longevity: 93 mo
Brady Statistic AP VP Percent: 0.13 %
Brady Statistic AP VS Percent: 0.01 %
Brady Statistic AS VP Percent: 98.96 %
Brady Statistic AS VS Percent: 0.9 %
Brady Statistic RV Percent Paced: 98.03 %
HighPow Impedance: 41 Ohm
HighPow Impedance: 51 Ohm
Implantable Lead Implant Date: 20090720
Implantable Lead Implant Date: 20090720
Implantable Lead Implant Date: 20170627
Implantable Lead Location: 753858
Implantable Lead Location: 753860
Implantable Lead Model: 4598
Implantable Lead Model: 6947
Lead Channel Impedance Value: 266 Ohm
Lead Channel Impedance Value: 380 Ohm
Lead Channel Impedance Value: 380 Ohm
Lead Channel Impedance Value: 456 Ohm
Lead Channel Impedance Value: 456 Ohm
Lead Channel Impedance Value: 513 Ohm
Lead Channel Impedance Value: 722 Ohm
Lead Channel Pacing Threshold Amplitude: 0.5 V
Lead Channel Pacing Threshold Amplitude: 1 V
Lead Channel Pacing Threshold Amplitude: 1.25 V
Lead Channel Pacing Threshold Pulse Width: 0.4 ms
Lead Channel Sensing Intrinsic Amplitude: 3.75 mV
Lead Channel Sensing Intrinsic Amplitude: 4.25 mV
Lead Channel Setting Pacing Amplitude: 2 V
Lead Channel Setting Sensing Sensitivity: 0.3 mV
MDC IDC LEAD LOCATION: 753859
MDC IDC MSMT BATTERY VOLTAGE: 3.1 V
MDC IDC MSMT LEADCHNL LV IMPEDANCE VALUE: 570 Ohm
MDC IDC MSMT LEADCHNL LV IMPEDANCE VALUE: 570 Ohm
MDC IDC MSMT LEADCHNL LV IMPEDANCE VALUE: 646 Ohm
MDC IDC MSMT LEADCHNL LV IMPEDANCE VALUE: 760 Ohm
MDC IDC MSMT LEADCHNL LV PACING THRESHOLD PULSEWIDTH: 0.4 ms
MDC IDC MSMT LEADCHNL RA PACING THRESHOLD PULSEWIDTH: 0.4 ms
MDC IDC MSMT LEADCHNL RV IMPEDANCE VALUE: 323 Ohm
MDC IDC MSMT LEADCHNL RV IMPEDANCE VALUE: 399 Ohm
MDC IDC MSMT LEADCHNL RV SENSING INTR AMPL: 5.625 mV
MDC IDC MSMT LEADCHNL RV SENSING INTR AMPL: 5.625 mV
MDC IDC SESS DTM: 20170710112435
MDC IDC SET LEADCHNL LV PACING AMPLITUDE: 2.5 V
MDC IDC SET LEADCHNL LV PACING PULSEWIDTH: 0.4 ms
MDC IDC SET LEADCHNL RA PACING AMPLITUDE: 1.5 V
MDC IDC SET LEADCHNL RV PACING PULSEWIDTH: 0.4 ms
MDC IDC STAT BRADY RA PERCENT PACED: 0.14 %

## 2015-07-14 NOTE — Progress Notes (Signed)
Wound check appointment. Steri-strips removed. Wound without redness or edema. Incision edges approximated, wound well healed. Normal device function. Thresholds, sensing, and impedances consistent with implant measurements. Histogram distribution appropriate for patient and level of activity. No mode switches or ventricular arrhythmias noted. Patient educated about wound care, arm mobility, lifting restrictions, shock plan. ROV with JA 10/06/15.

## 2015-07-22 ENCOUNTER — Telehealth: Payer: Self-pay

## 2015-07-22 NOTE — Telephone Encounter (Signed)
Referred to ICM clinic by Dr Rayann Heman.  Spoke with patient by phone.  Provided ICM intro and she agreed to monthly ICM calls.  Explained will schedule 1st monthly remote ICM transmission for 08/22/2015.  Provided ICM number and encouraged to call for fluid symptoms.  She stated she is feeling well at this time.  She gave permission to leave detailed message on phone.

## 2015-07-22 NOTE — Progress Notes (Signed)
Referred to ICM clinic by Dr Rayann Heman.

## 2015-08-22 ENCOUNTER — Ambulatory Visit (INDEPENDENT_AMBULATORY_CARE_PROVIDER_SITE_OTHER): Payer: Medicare HMO

## 2015-08-22 ENCOUNTER — Telehealth: Payer: Self-pay

## 2015-08-22 DIAGNOSIS — Z95810 Presence of automatic (implantable) cardiac defibrillator: Secondary | ICD-10-CM

## 2015-08-22 DIAGNOSIS — I5022 Chronic systolic (congestive) heart failure: Secondary | ICD-10-CM

## 2015-08-22 NOTE — Telephone Encounter (Signed)
Remote ICM transmission received.  Attempted patient call and left message for return call.   

## 2015-08-22 NOTE — Progress Notes (Signed)
EPIC Encounter for ICM Monitoring  Patient Name: Shelley Douglas is a 65 y.o. female Date: 08/22/2015 Primary Care Physican: Barbette Merino, MD Primary Cardiologist: Doylene Canard Electrophysiologist: Allred Dry Weight: unknown Bi-V Pacing:  99.1%       1st ICM encounter  Attempted ICM call and unable to reach.  Transmission reviewed.   Thoracic impedance normal.  Follow-up plan: ICM clinic phone appointment on 09/22/2015.  Copy of ICM check sent to device physician.   ICM trend: 08/22/2015       Rosalene Billings, RN 08/22/2015 8:48 AM

## 2015-09-04 ENCOUNTER — Other Ambulatory Visit: Payer: Self-pay | Admitting: Internal Medicine

## 2015-09-04 ENCOUNTER — Encounter: Payer: Self-pay | Admitting: Gastroenterology

## 2015-09-04 DIAGNOSIS — Z1231 Encounter for screening mammogram for malignant neoplasm of breast: Secondary | ICD-10-CM

## 2015-09-22 ENCOUNTER — Telehealth: Payer: Self-pay | Admitting: Cardiology

## 2015-09-22 ENCOUNTER — Ambulatory Visit (INDEPENDENT_AMBULATORY_CARE_PROVIDER_SITE_OTHER): Payer: Medicare HMO

## 2015-09-22 DIAGNOSIS — Z95810 Presence of automatic (implantable) cardiac defibrillator: Secondary | ICD-10-CM

## 2015-09-22 DIAGNOSIS — I5022 Chronic systolic (congestive) heart failure: Secondary | ICD-10-CM

## 2015-09-22 NOTE — Telephone Encounter (Signed)
LMOVM reminding pt to send remote transmission.   

## 2015-09-23 ENCOUNTER — Telehealth: Payer: Self-pay

## 2015-09-23 NOTE — Telephone Encounter (Signed)
Remote ICM transmission received.  Attempted patient call and left message for return call.   

## 2015-09-23 NOTE — Progress Notes (Signed)
EPIC Encounter for ICM Monitoring  Patient Name: Shelley Douglas is a 65 y.o. female Date: 09/23/2015 Primary Care Physican: Barbette Merino, MD Primary Cardiologist: Doylene Canard Electrophysiologist: Allred Dry Weight: unknown Bi-V Pacing:  99.2%        Attempted ICM call and unable to reach.  Transmission reviewed.   Thoracic impedance abnormal suggesting fluid accumulation.  Follow-up plan: ICM clinic phone appointment on 10/01/2015 to recheck fluid levels.  Office check with Dr Rayann Heman on 10/06/2015  Copy of ICM check sent to primary cardiologist and device physician.   ICM trend: 09/22/2015       Rosalene Billings, RN 09/23/2015 10:10 AM

## 2015-09-25 NOTE — Progress Notes (Signed)
Patient returned call.  She stated she had a little leg swelling in her feet earlier this week but it has resolved.  She has Furosemide 40 mg prescribed prn and will take 1 tablet today and tomorrow.  Recheck fluid levels on 10/01/2015.  Advised to call back if she has any changes.

## 2015-10-01 ENCOUNTER — Telehealth: Payer: Self-pay

## 2015-10-01 ENCOUNTER — Ambulatory Visit (INDEPENDENT_AMBULATORY_CARE_PROVIDER_SITE_OTHER): Payer: Medicare HMO

## 2015-10-01 DIAGNOSIS — Z95810 Presence of automatic (implantable) cardiac defibrillator: Secondary | ICD-10-CM

## 2015-10-01 DIAGNOSIS — I5022 Chronic systolic (congestive) heart failure: Secondary | ICD-10-CM

## 2015-10-01 NOTE — Telephone Encounter (Signed)
Remote ICM transmission received.  Attempted patient call and left detailed message regarding transmission and next ICM scheduled for 11/06/2015.  Advised to return call for any fluid symptoms or questions.

## 2015-10-01 NOTE — Progress Notes (Signed)
EPIC Encounter for ICM Monitoring  Patient Name: Shelley Douglas is a 65 y.o. female Date: 10/01/2015 Primary Care Physican: Barbette Merino, MD Primary [place] Electrophysiologist: Allred Dry Weight:  unknown Bi-V Pacing:  99.4%       Attempted ICM call and unable to reach. Left detailed message regarding transmission. Transmission reviewed.   Thoracic impedance returned to normal after taking 3 days of prn Furosemide.  Follow-up plan: ICM clinic phone appointment on 11/06/2015.  Copy of ICM check sent to device physician.   ICM trend: 10/01/2015       Rosalene Billings, RN 10/01/2015 8:24 AM

## 2015-10-06 ENCOUNTER — Encounter: Payer: Self-pay | Admitting: Internal Medicine

## 2015-10-06 ENCOUNTER — Encounter (INDEPENDENT_AMBULATORY_CARE_PROVIDER_SITE_OTHER): Payer: Self-pay

## 2015-10-06 ENCOUNTER — Ambulatory Visit (INDEPENDENT_AMBULATORY_CARE_PROVIDER_SITE_OTHER): Payer: Medicare HMO | Admitting: Internal Medicine

## 2015-10-06 VITALS — BP 140/82 | HR 87 | Ht 66.0 in | Wt 206.0 lb

## 2015-10-06 DIAGNOSIS — I255 Ischemic cardiomyopathy: Secondary | ICD-10-CM

## 2015-10-06 DIAGNOSIS — Z95810 Presence of automatic (implantable) cardiac defibrillator: Secondary | ICD-10-CM

## 2015-10-06 DIAGNOSIS — I4819 Other persistent atrial fibrillation: Secondary | ICD-10-CM

## 2015-10-06 DIAGNOSIS — I481 Persistent atrial fibrillation: Secondary | ICD-10-CM | POA: Diagnosis not present

## 2015-10-06 DIAGNOSIS — I5022 Chronic systolic (congestive) heart failure: Secondary | ICD-10-CM | POA: Diagnosis not present

## 2015-10-06 LAB — CUP PACEART INCLINIC DEVICE CHECK
Brady Statistic AP VS Percent: 0.01 %
Brady Statistic AS VP Percent: 99.32 %
Brady Statistic RA Percent Paced: 0.23 %
Brady Statistic RV Percent Paced: 99.13 %
HIGH POWER IMPEDANCE MEASURED VALUE: 41 Ohm
HIGH POWER IMPEDANCE MEASURED VALUE: 49 Ohm
Implantable Lead Implant Date: 20090720
Implantable Lead Location: 753859
Implantable Lead Model: 5076
Lead Channel Impedance Value: 323 Ohm
Lead Channel Impedance Value: 399 Ohm
Lead Channel Impedance Value: 456 Ohm
Lead Channel Impedance Value: 627 Ohm
Lead Channel Impedance Value: 665 Ohm
Lead Channel Impedance Value: 855 Ohm
Lead Channel Impedance Value: 969 Ohm
Lead Channel Pacing Threshold Amplitude: 1 V
Lead Channel Pacing Threshold Pulse Width: 0.4 ms
Lead Channel Sensing Intrinsic Amplitude: 8.875 mV
Lead Channel Setting Pacing Amplitude: 1.5 V
Lead Channel Setting Pacing Amplitude: 2 V
Lead Channel Setting Pacing Pulse Width: 0.4 ms
Lead Channel Setting Sensing Sensitivity: 0.3 mV
MDC IDC LEAD IMPLANT DT: 20090720
MDC IDC LEAD IMPLANT DT: 20170627
MDC IDC LEAD LOCATION: 753858
MDC IDC LEAD LOCATION: 753860
MDC IDC LEAD MODEL: 4598
MDC IDC MSMT BATTERY REMAINING LONGEVITY: 86 mo
MDC IDC MSMT BATTERY VOLTAGE: 3.04 V
MDC IDC MSMT LEADCHNL LV IMPEDANCE VALUE: 437 Ohm
MDC IDC MSMT LEADCHNL LV IMPEDANCE VALUE: 570 Ohm
MDC IDC MSMT LEADCHNL LV IMPEDANCE VALUE: 646 Ohm
MDC IDC MSMT LEADCHNL LV IMPEDANCE VALUE: 969 Ohm
MDC IDC MSMT LEADCHNL LV PACING THRESHOLD AMPLITUDE: 1.25 V
MDC IDC MSMT LEADCHNL LV PACING THRESHOLD PULSEWIDTH: 0.4 ms
MDC IDC MSMT LEADCHNL RA IMPEDANCE VALUE: 494 Ohm
MDC IDC MSMT LEADCHNL RA PACING THRESHOLD AMPLITUDE: 0.5 V
MDC IDC MSMT LEADCHNL RA SENSING INTR AMPL: 5.25 mV
MDC IDC MSMT LEADCHNL RV IMPEDANCE VALUE: 342 Ohm
MDC IDC MSMT LEADCHNL RV PACING THRESHOLD PULSEWIDTH: 0.4 ms
MDC IDC SESS DTM: 20171002100532
MDC IDC SET LEADCHNL LV PACING AMPLITUDE: 2.75 V
MDC IDC SET LEADCHNL RV PACING PULSEWIDTH: 0.4 ms
MDC IDC STAT BRADY AP VP PERCENT: 0.22 %
MDC IDC STAT BRADY AS VS PERCENT: 0.45 %

## 2015-10-06 NOTE — Patient Instructions (Addendum)
Medication Instructions:  Your physician recommends that you continue on your current medications as directed. Please refer to the Current Medication list given to you today.   Labwork: None ordered   Testing/Procedures: None ordered   Follow-Up: Your physician wants you to follow-up in: 12 months with Chanetta Marshall, NP. You will receive a reminder letter in the mail two months in advance. If you don't receive a letter, please call our office to schedule the follow-up appointment.  Remote monitoring is used to monitor your ICD from home. This monitoring reduces the number of office visits required to check your device to one time per year. It allows [place] to keep an eye on the functioning of your device to ensure it is working properly. You are scheduled for a device check from home on 01/05/16. You may send your transmission at any time that day. If you have a wireless device, the transmission will be sent automatically. After your physician reviews your transmission, you will receive a postcard with your next transmission date.    Any Other Special Instructions Will Be Listed Below (If Applicable).     If you need a refill on your cardiac medications before your next appointment, please call your pharmacy.

## 2015-10-06 NOTE — Progress Notes (Signed)
6   Electrophysiology Office Note   Date:  10/06/2015   ID:  Shelley Douglas, DOB 1950/11/14, MRN [id number]  PCP:  Barbette Merino, MD  Cardiologist:  Dr Doylene Canard Primary Electrophysiologist: Thompson Grayer, MD    Chief Complaint  Patient presents with  . Atrial Fibrillation     History of Present Illness: Shelley Douglas is a 65 y.o. female who presents today for EP follow-up.  She underwent BiV ICD upgrade by me 6/17.  She has done very well since that time. Her SOB is "much improved".  Today, she denies symptoms of chest pain, lower extremity edema, claudication, dizziness, presyncope, syncope, bleeding, or neurologic sequela.   The patient is tolerating medications without difficulties and is otherwise without complaint today.    Past Medical History:  Diagnosis Date  . Anxiety   . CAD (coronary artery disease)    last cath 2016  . Diabetes ([place])   . DM (diabetes mellitus) ([place])   . DVT (deep venous thrombosis) ([place]) 1996  . Headache   . Hyperthyroidism   . Ischemic cardiomyopathy   . Left bundle branch block   . Mitral valve regurgitation   . Obesity   . Persistent atrial fibrillation ([place])   . Pulmonary embolism ([place]) 1993  . Stroke [facility]) 2001   Past Surgical History:  Procedure Laterality Date  . CARDIAC CATHETERIZATION N/A 09/27/2014   Procedure: Left Heart Cath and Coronary Angiography;  Surgeon: Dixie Dials, MD;  Location: [facility];  Service: Cardiovascular;  Laterality: N/A;  . CARDIAC DEFIBRILLATOR PLACEMENT  07/24/2007   Medtronic Secura DR implanted in [place] for primary prevention  . CORONARY ANGIOPLASTY WITH STENT PLACEMENT    . CORONARY ARTERY BYPASS GRAFT    . EP IMPLANTABLE DEVICE N/A 07/01/2015   BiV ICD upgrade to Medtronic Viva Quad XT CRT-D device for primary prevention  . HERNIA REPAIR  1990's   . VENA CAVA FILTER PLACEMENT       Current Outpatient Prescriptions  Medication Sig Dispense Refill  . amiodarone (PACERONE) 200 MG  tablet Take 1 tablet (200 mg total) by mouth daily. 30 tablet 3  . carvedilol (COREG) 12.5 MG tablet Take 12.5 mg by mouth 2 (two) times daily with a meal.    . digoxin (LANOXIN) 0.125 MG tablet Take 125 mcg by mouth daily.     . folic acid (FOLVITE) 1 MG tablet Take 1 mg by mouth daily.     . furosemide (LASIX) 40 MG tablet Take 40 mg by mouth daily as needed (swelling).    Marland Kitchen lisinopril (PRINIVIL,ZESTRIL) 5 MG tablet Take 1 tablet (5 mg total) by mouth daily. 90 tablet 3  . nitroGLYCERIN (NITROSTAT) 0.4 MG SL tablet Place 1 tablet (0.4 mg total) under the tongue every 5 (five) minutes x 3 doses as needed for chest pain. 25 tablet 3  . simvastatin (ZOCOR) 20 MG tablet Take 20 mg by mouth daily.     Alveda Reasons 20 MG TABS tablet Take 20 mg by mouth at bedtime.     No current facility-administered medications for this visit.     Allergies:   Clopidogrel bisulfate   Social History:  The patient  reports that she quit smoking about 27 years ago. She has never used smokeless tobacco. She reports that she does not drink alcohol or use drugs.   Family History:  The patient's mother had hypertension but lived to 69s.   ROS:  Please see the history of present illness.  All other systems are reviewed and negative.    PHYSICAL EXAM: VS:  BP 140/82   Pulse 87   Ht 5\' 6"  (1.676 m)   Wt 206 lb (93.4 kg)   BMI 33.25 kg/m  , BMI Body mass index is 33.25 kg/m. GEN: Well nourished, well developed, in no acute distress  HEENT: normal  Neck: no JVD, carotid bruits, or masses Cardiac: RRR; no murmurs, rubs, or gallops,no edema  Respiratory:  clear to auscultation bilaterally, normal work of breathing GI: soft, nontender, nondistended, + BS MS: no deformity or atrophy  Skin: warm and dry, device pocket is well healed Neuro:  Strength and sensation are intact Psych: euthymic mood, full affect  EKG:  EKG is ordered today. The ekg ordered today shows sinus rhythm with BiV pacing  Device  interrogation is reviewed today in detail.  See PaceArt for details.   Recent Labs: 12/07/2014: B Natriuretic Peptide 778.5 12/31/2014: ALT 93 06/20/2015: BUN 10; Creat 0.93; Hemoglobin 12.5; Platelets 186; Potassium 5.4; Sodium 139    Lipid Panel     Component Value Date/Time   CHOL 216 (H) 12/07/2014 0244   TRIG 97 12/07/2014 0244   HDL 55 12/07/2014 0244   CHOLHDL 3.9 12/07/2014 0244   VLDL 19 12/07/2014 0244   LDLCALC 142 (H) 12/07/2014 0244     Wt Readings from Last 3 Encounters:  10/06/15 206 lb (93.4 kg)  07/01/15 205 lb (93 kg)  06/20/15 200 lb 9.6 oz (91 kg)     ASSESSMENT AND PLAN:  1.  Ischemic CM (EF 20%), NYHA Class III CHF, LBBB Doing very well s/p BiV upgrade Continue current medical therapy Followed in ICM device clinic She has follow-up scheduled with Dr Doylene Canard.  Would advise follow-up echo to evaluate response to CRT.  2. Persistent afib Currently controlled with amidoarone Labs followed by Dr Doylene Canard On xarelto chads2vasc score is at least 8.  Continue xarelto Would consider weaning amiodarone but will defer to Dr Doylene Canard  3. Prior PTE/ DVT On xarelto  Current medicines are reviewed at length with the patient today.   The patient does not have concerns regarding her medicines.  The following changes were made today:  none    Signed, Thompson Grayer, MD  10/06/2015 9:38 AM     [facility] [address] [place] [address] [phone] (office) [phone] (fax)

## 2015-11-04 ENCOUNTER — Ambulatory Visit
Admission: RE | Admit: 2015-11-04 | Discharge: 2015-11-04 | Disposition: A | Discharge status: Home / Self Care | Payer: Medicare HMO | Source: Ambulatory Visit | Attending: Cardiovascular Disease | Admitting: Cardiovascular Disease

## 2015-11-04 ENCOUNTER — Other Ambulatory Visit: Payer: Self-pay | Admitting: Cardiovascular Disease

## 2015-11-04 DIAGNOSIS — R062 Wheezing: Secondary | ICD-10-CM

## 2015-11-04 DIAGNOSIS — R0602 Shortness of breath: Secondary | ICD-10-CM

## 2015-11-06 ENCOUNTER — Telehealth: Payer: Self-pay

## 2015-11-06 ENCOUNTER — Ambulatory Visit (INDEPENDENT_AMBULATORY_CARE_PROVIDER_SITE_OTHER): Payer: Medicare HMO

## 2015-11-06 DIAGNOSIS — Z95810 Presence of automatic (implantable) cardiac defibrillator: Secondary | ICD-10-CM

## 2015-11-06 DIAGNOSIS — I5022 Chronic systolic (congestive) heart failure: Secondary | ICD-10-CM

## 2015-11-06 NOTE — Telephone Encounter (Signed)
Attempted call to patient and left message to send remote transmission today.

## 2015-11-07 ENCOUNTER — Ambulatory Visit
Admission: RE | Admit: 2015-11-07 | Discharge: 2015-11-07 | Disposition: A | Discharge status: Home / Self Care | Payer: Medicare HMO | Source: Ambulatory Visit | Attending: Internal Medicine | Admitting: Internal Medicine

## 2015-11-07 ENCOUNTER — Telehealth: Payer: Self-pay

## 2015-11-07 DIAGNOSIS — Z1231 Encounter for screening mammogram for malignant neoplasm of breast: Secondary | ICD-10-CM

## 2015-11-07 NOTE — Telephone Encounter (Signed)
Remote ICM transmission received.  Attempted patient call and left detailed message regarding transmission and to return call.    

## 2015-11-07 NOTE — Progress Notes (Signed)
EPIC Encounter for ICM Monitoring  Patient Name: Shelley Douglas is a 65 y.o. female Date: 11/07/2015 Primary Care Physican: Barbette Merino, MD Primary [place] Electrophysiologist: Allred Dry Weight: unknown Bi-V Pacing:  98.4%       Attempted ICM call and unable to reach. Left detailed message regarding transmission and to return call.  Transmission reviewed.   Thoracic impedance starting to trend toward abnormal suggesting fluid accumulation.  Furosemide 40 mg prn.    Follow-up plan: ICM clinic phone appointment on 11/13/2015 to recheck fluid levels.  Copy of ICM check sent to device physician.   ICM trend: 11/06/2015       Rosalene Billings, RN 11/07/2015 10:11 AM

## 2015-11-13 ENCOUNTER — Other Ambulatory Visit: Payer: Self-pay

## 2015-11-13 ENCOUNTER — Ambulatory Visit: Payer: Medicare HMO

## 2015-11-13 ENCOUNTER — Telehealth: Payer: Self-pay | Admitting: Cardiology

## 2015-11-13 ENCOUNTER — Ambulatory Visit: Payer: Medicare HMO | Admitting: Gastroenterology

## 2015-11-13 DIAGNOSIS — I5022 Chronic systolic (congestive) heart failure: Secondary | ICD-10-CM

## 2015-11-13 DIAGNOSIS — Z95810 Presence of automatic (implantable) cardiac defibrillator: Secondary | ICD-10-CM

## 2015-11-13 NOTE — Telephone Encounter (Signed)
LMOVM reminding pt to send remote transmission.   

## 2015-11-14 NOTE — Progress Notes (Signed)
EPIC Encounter for ICM Monitoring  Patient Name: Shelley Douglas is a 65 y.o. female Date: 11/14/2015 Primary Care Physican: Barbette Merino, MD Primary [place] Electrophysiologist: Allred Dry Weight:    214 lbs Bi-V Pacing:  96.4%                                                         Heart Failure questions reviewed, pt symptomatic with SOB when walking and ankle swelling.  Thoracic impedance abnormal suggesting fluid accumulation.  Recommendations:  Advised to take Furosemide 40 mg x 2 days and then return to prn.   Follow-up plan: ICM clinic phone appointment on 11/17/2015 to recheck fluid levels.  Copy of ICM check sent to device physician.   ICM trend: 11/14/2015       Rosalene Billings, RN 11/14/2015 10:41 AM

## 2015-11-17 ENCOUNTER — Telehealth: Payer: Self-pay

## 2015-11-17 ENCOUNTER — Ambulatory Visit (INDEPENDENT_AMBULATORY_CARE_PROVIDER_SITE_OTHER): Payer: Medicare HMO

## 2015-11-17 DIAGNOSIS — I5022 Chronic systolic (congestive) heart failure: Secondary | ICD-10-CM

## 2015-11-17 DIAGNOSIS — Z95810 Presence of automatic (implantable) cardiac defibrillator: Secondary | ICD-10-CM

## 2015-11-17 NOTE — Telephone Encounter (Signed)
Remote ICM transmission received.  Attempted patient call and left detailed message regarding transmission and next ICM scheduled for 12/08/2015.  Advised to return call for any fluid symptoms or questions.

## 2015-11-17 NOTE — Progress Notes (Signed)
EPIC Encounter for ICM Monitoring  Patient Name: Shelley Douglas is a 65 y.o. female Date: 11/17/2015 Primary Care Physican: Barbette Merino, MD Primary [place] Electrophysiologist: Allred Dry Weight: unknown Bi-V Pacing:  94.8%       Attempted ICM call and unable to reach. Left detailed message regarding transmission.  Transmission reviewed.   Thoracic impedance returned to normal after Furosemide increase x 2 days.    Follow-up plan: ICM clinic phone appointment on 12/08/2015.  Copy of ICM check sent to device physician.   ICM trend: 11/17/2015       Rosalene Billings, RN 11/17/2015 11:04 AM

## 2015-12-08 ENCOUNTER — Telehealth: Payer: Self-pay

## 2015-12-08 ENCOUNTER — Ambulatory Visit (INDEPENDENT_AMBULATORY_CARE_PROVIDER_SITE_OTHER): Payer: Medicare HMO

## 2015-12-08 DIAGNOSIS — I5022 Chronic systolic (congestive) heart failure: Secondary | ICD-10-CM | POA: Diagnosis not present

## 2015-12-08 DIAGNOSIS — Z95810 Presence of automatic (implantable) cardiac defibrillator: Secondary | ICD-10-CM

## 2015-12-08 NOTE — Telephone Encounter (Signed)
Remote ICM transmission received.  Attempted patient call and left detailed message regarding transmission and next ICM scheduled for 01/08/2016.  Advised to return call for any fluid symptoms or questions.

## 2015-12-08 NOTE — Progress Notes (Signed)
EPIC Encounter for ICM Monitoring  Patient Name: Shelley Douglas is a 65 y.o. female Date: 12/08/2015 Primary Care Physican: Barbette Merino, MD Primary [place] Electrophysiologist: Allred Dry Weight:    unknown Bi-V Pacing:  96.4%          Attempted ICM call and unable to reach.  Left detailed message regarding transmission.  Transmission reviewed.   Thoracic impedance almost at normal baseline.    Recommendations:  N/A - unable to reach  Follow-up plan: ICM clinic phone appointment on 01/08/2016.  Copy of ICM check sent to device physician.   ICM trend: 12/08/2015       Rosalene Billings, RN 12/08/2015 2:11 PM

## 2016-01-08 ENCOUNTER — Ambulatory Visit (INDEPENDENT_AMBULATORY_CARE_PROVIDER_SITE_OTHER): Payer: Medicare HMO | Admitting: *Deleted

## 2016-01-08 DIAGNOSIS — Z95810 Presence of automatic (implantable) cardiac defibrillator: Secondary | ICD-10-CM | POA: Diagnosis not present

## 2016-01-08 DIAGNOSIS — I255 Ischemic cardiomyopathy: Secondary | ICD-10-CM | POA: Diagnosis not present

## 2016-01-08 DIAGNOSIS — I5022 Chronic systolic (congestive) heart failure: Secondary | ICD-10-CM | POA: Diagnosis not present

## 2016-01-08 NOTE — Progress Notes (Signed)
Remote ICD transmission.   

## 2016-01-09 ENCOUNTER — Encounter: Payer: Self-pay | Admitting: Cardiology

## 2016-01-09 NOTE — Progress Notes (Signed)
Patient returned call.  She stated a few days ago she was a little Shelley Douglas of breath but feeling better today.  Advised she can take the Furosemide if she needs it for fluid symptoms.  Advised will recheck fluid levels on 01/16/2016.

## 2016-01-09 NOTE — Progress Notes (Signed)
EPIC Encounter for ICM Monitoring  Patient Name: Shelley Douglas is a 66 y.o. female Date: 01/09/2016 Primary Care Physican: Barbette Merino, MD Primary [place] Electrophysiologist: Allred Dry Weight:unknown Bi-V Pacing: 98.5%       Attempted ICM call and unable to reach.  Left detailed message regarding transmission.  Transmission reviewed.   Thoracic impedance abnormal suggesting fluid accumulation but trending back to baseline.  Recommendations:  Provided direct ICM number and requested return call.     Follow-up plan: ICM clinic phone appointment on 01/16/2016 to recheck fluid levels.  Copy of ICM check sent to device physician.   3 month ICM trend : 01/08/2016   1 Year ICM trend:      Rosalene Billings, RN 01/09/2016 12:40 PM

## 2016-01-15 ENCOUNTER — Encounter (INDEPENDENT_AMBULATORY_CARE_PROVIDER_SITE_OTHER): Payer: Self-pay

## 2016-01-15 ENCOUNTER — Encounter: Payer: Self-pay | Admitting: Gastroenterology

## 2016-01-15 ENCOUNTER — Ambulatory Visit (INDEPENDENT_AMBULATORY_CARE_PROVIDER_SITE_OTHER): Payer: Medicare HMO | Admitting: Gastroenterology

## 2016-01-15 VITALS — BP 140/88 | HR 84 | Ht 66.0 in | Wt 210.0 lb

## 2016-01-15 DIAGNOSIS — I5022 Chronic systolic (congestive) heart failure: Secondary | ICD-10-CM | POA: Diagnosis not present

## 2016-01-15 DIAGNOSIS — Z7901 Long term (current) use of anticoagulants: Secondary | ICD-10-CM

## 2016-01-15 DIAGNOSIS — Z1211 Encounter for screening for malignant neoplasm of colon: Secondary | ICD-10-CM | POA: Diagnosis not present

## 2016-01-15 DIAGNOSIS — I4819 Other persistent atrial fibrillation: Secondary | ICD-10-CM

## 2016-01-15 DIAGNOSIS — I481 Persistent atrial fibrillation: Secondary | ICD-10-CM | POA: Diagnosis not present

## 2016-01-15 NOTE — Progress Notes (Signed)
[facility] Consult Note:  History: Shelley Douglas 01/15/2016  Referring physician: Barbette Merino, MD  Reason for consult/chief complaint: Colon Cancer Screening (Patient taking Xarelto, last colon in [place] 2010, does not remember any details)   Subjective  HPI:  This is a 66 year old woman with advanced cardiac disease as noted below who was referred by primary care for consideration of screening colonoscopy. She seems uncertain about her history: Initially believing that she had had a colonoscopy sometime in the last 10 years, then saying perhaps it was in about the year 2000. At any rate, there are certainly no outside records for review. She denies abdominal pain, altered bowel habits or rectal bleeding.  ROS:  Review of Systems She has chronic dyspnea with exertion and denies chest pain.  Past Medical History: Past Medical History:  Diagnosis Date  . Anxiety   . CAD (coronary artery disease)    last cath 2016  . Diabetes ([place])   . DM (diabetes mellitus) ([place])   . DVT (deep venous thrombosis) ([place]) 1996  . Headache   . Hyperthyroidism   . Ischemic cardiomyopathy   . Left bundle branch block   . Mitral valve regurgitation   . Obesity   . Persistent atrial fibrillation ([place])   . Pulmonary embolism ([place]) 1993  . Stroke [facility]) 2001   Ejection fraction 20% Chronically occluded right coronary artery Last general cardiology and EP notes were reviewed.  Past Surgical History: Past Surgical History:  Procedure Laterality Date  . CARDIAC CATHETERIZATION N/A 09/27/2014   Procedure: Left Heart Cath and Coronary Angiography;  Surgeon: Dixie Dials, MD;  Location: [facility];  Service: Cardiovascular;  Laterality: N/A;  . CARDIAC DEFIBRILLATOR PLACEMENT  07/24/2007   Medtronic Secura DR implanted in [place] for primary prevention  . CORONARY ANGIOPLASTY WITH STENT PLACEMENT    . CORONARY ARTERY BYPASS GRAFT    . EP IMPLANTABLE DEVICE N/A 07/01/2015   BiV  ICD upgrade to Medtronic Viva Quad XT CRT-D device for primary prevention  . HERNIA REPAIR  1990's   . VENA CAVA FILTER PLACEMENT       Family History: Family History  Problem Relation Age of Onset  . Hypertension      Social History: Social History   Social History  . Marital status: Married    Spouse name: N/A  . Number of children: N/A  . Years of education: N/A   Occupational History  . disabled    Social History Main Topics  . Smoking status: Former Smoker    Quit date: 01/05/1988  . Smokeless tobacco: Never Used  . Alcohol use No  . Drug use: No  . Sexual activity: Not Asked   Other Topics Concern  . None   Social History Narrative   Pt lives in [place] with spouse.   Disabled       Allergies: Allergies  Allergen Reactions  . Clopidogrel Bisulfate Rash    Outpatient Meds: Current Outpatient Prescriptions  Medication Sig Dispense Refill  . amiodarone (PACERONE) 200 MG tablet Take 1 tablet (200 mg total) by mouth daily. 30 tablet 3  . carvedilol (COREG) 12.5 MG tablet Take 12.5 mg by mouth 2 (two) times daily with a meal.    . digoxin (LANOXIN) 0.125 MG tablet Take 125 mcg by mouth daily.     . folic acid (FOLVITE) 1 MG tablet Take 1 mg by mouth daily.     . furosemide (LASIX) 40 MG tablet Take 40 mg by mouth  daily as needed (swelling).    Marland Kitchen lisinopril (PRINIVIL,ZESTRIL) 5 MG tablet Take 1 tablet (5 mg total) by mouth daily. 90 tablet 3  . nitroGLYCERIN (NITROSTAT) 0.4 MG SL tablet Place 1 tablet (0.4 mg total) under the tongue every 5 (five) minutes x 3 doses as needed for chest pain. 25 tablet 3  . simvastatin (ZOCOR) 20 MG tablet Take 20 mg by mouth daily.     Alveda Reasons 20 MG TABS tablet Take 20 mg by mouth at bedtime.     No current facility-administered medications for this visit.       ___________________________________________________________________ Objective   Exam:  BP 140/88   Pulse 84   Ht 5\' 6"  (1.676 m)   Wt 210 lb (95.3 kg)    BMI 33.89 kg/m    General: this is a(n) Pleasant elderly woman, alert conversational, good muscle mass   Eyes: sclera anicteric, no redness  ENT: oral mucosa moist without lesions, no cervical or supraclavicular lymphadenopathy, good dentition  CV: RRR without murmur, S1/S2, no JVD, mild ankle edema. AICD left upper chest wall  Resp: clear to auscultation bilaterally, normal RR and effort noted  GI: soft, no tenderness, with active bowel sounds. No guarding or palpable organomegaly noted.  Skin; warm and dry, no rash or jaundice noted  Neuro: awake, alert and oriented x 3. Normal gross motor function and fluent speech  Labs:  CBC    Component Value Date/Time   WBC 3.2 (L) 06/20/2015 1021   RBC 4.45 06/20/2015 1021   HGB 12.5 06/20/2015 1021   HCT 38.1 06/20/2015 1021   PLT 186 06/20/2015 1021   MCV 85.6 06/20/2015 1021   MCH 28.1 06/20/2015 1021   MCHC 32.8 06/20/2015 1021   RDW 15.6 (H) 06/20/2015 1021   LYMPHSABS 1,312 06/20/2015 1021   MONOABS 544 06/20/2015 1021   EOSABS 128 06/20/2015 1021   BASOSABS 32 06/20/2015 1021     Assessment: Encounter Diagnoses  Name Primary?  . Special screening for malignant neoplasms, colon Yes  . Persistent atrial fibrillation ([place])   . Chronic systolic congestive heart failure ([place])   . Current use of long term anticoagulation     She is definitely at increased risk for cardio pulmonary complications of sedation and colonoscopy. There is no way of knowing when she was last screened for colorectal cancer.  Plan:  Colo-guard We discussed the potential false positives and negatives of this test I still think it is a better primary tumor in her case of then colonoscopy. She understands that if were positive it warrants a colonoscopy, and I believe she could undergo that. It would need to be done off Coumadin for several days, with consultation by cardiology regarding the need for bridging Lovenox, and also would need to be  done in the hospital endoscopy lab. She is agreeable to a colo-guard test.  Thank you for the courtesy of this consult.  Please call me with any questions or concerns.  [name]  CCBarbette Merino, MD

## 2016-01-15 NOTE — Patient Instructions (Signed)
If you are age 66 or older, your body mass index should be between 23-30. Your Body mass index is 33.89 kg/m. If this is out of the aforementioned range listed, please consider follow up with your Primary Care Provider.  If you are age 62 or younger, your body mass index should be between 19-25. Your Body mass index is 33.89 kg/m. If this is out of the aformentioned range listed, please consider follow up with your Primary Care Provider.   We have sent your demographic and insurance information to [employer]. They should contact you within the next week regarding your Cologuard (colon cancer screening) test. If you have not heard from them within the next week, please call our office at [phone].  Thank you for choosing [facility]  Dr Wilfrid Lund III

## 2016-01-16 ENCOUNTER — Ambulatory Visit (INDEPENDENT_AMBULATORY_CARE_PROVIDER_SITE_OTHER): Payer: Medicare HMO

## 2016-01-16 ENCOUNTER — Telehealth: Payer: Self-pay

## 2016-01-16 DIAGNOSIS — I5022 Chronic systolic (congestive) heart failure: Secondary | ICD-10-CM

## 2016-01-16 DIAGNOSIS — Z95810 Presence of automatic (implantable) cardiac defibrillator: Secondary | ICD-10-CM

## 2016-01-16 NOTE — Telephone Encounter (Signed)
Remote ICM transmission received.  Attempted patient call and left detailed message regarding transmission and next ICM scheduled for 02/09/2016.  Advised to return call for any fluid symptoms or questions.

## 2016-01-16 NOTE — Progress Notes (Signed)
EPIC Encounter for ICM Monitoring  Patient Name: Shelley Douglas is a 66 y.o. female Date: 01/16/2016 Primary Care Physican: Barbette Merino, MD Primary [place] Electrophysiologist: Allred Dry Weight:unknown Bi-V Pacing: 98.1%        Attempted ICM call and unable to reach.  Left detailed message regarding transmission.  Transmission reviewed.   Thoracic impedance back at normal at recheck of levels  Recommendations:  Provided ICM number and encouraged to call for fluid symptoms.  Follow-up plan: ICM clinic phone appointment on 02/09/2016.  Copy of ICM check sent to device physician.   3 month ICM trend: 01/16/2016   1 Year ICM trend:      Rosalene Billings, RN 01/16/2016 8:44 AM

## 2016-01-27 LAB — CUP PACEART REMOTE DEVICE CHECK
Brady Statistic AP VP Percent: 0.04 %
Brady Statistic AP VS Percent: 0.01 %
Brady Statistic AS VS Percent: 0.4 %
Brady Statistic RV Percent Paced: 98.51 %
Date Time Interrogation Session: 20180104102704
HighPow Impedance: 40 Ohm
HighPow Impedance: 47 Ohm
Implantable Lead Implant Date: 20090720
Implantable Lead Location: 753858
Implantable Lead Location: 753859
Implantable Lead Location: 753860
Implantable Lead Model: 4598
Implantable Lead Model: 6947
Lead Channel Impedance Value: 304 Ohm
Lead Channel Impedance Value: 342 Ohm
Lead Channel Impedance Value: 399 Ohm
Lead Channel Impedance Value: 456 Ohm
Lead Channel Impedance Value: 532 Ohm
Lead Channel Impedance Value: 779 Ohm
Lead Channel Pacing Threshold Amplitude: 0.625 V
Lead Channel Pacing Threshold Amplitude: 0.875 V
Lead Channel Pacing Threshold Amplitude: 1.5 V
Lead Channel Pacing Threshold Pulse Width: 0.4 ms
Lead Channel Sensing Intrinsic Amplitude: 3.625 mV
Lead Channel Setting Pacing Amplitude: 2 V
Lead Channel Setting Sensing Sensitivity: 0.3 mV
MDC IDC LEAD IMPLANT DT: 20090720
MDC IDC LEAD IMPLANT DT: 20170627
MDC IDC MSMT BATTERY REMAINING LONGEVITY: 90 mo
MDC IDC MSMT BATTERY VOLTAGE: 3.02 V
MDC IDC MSMT LEADCHNL LV IMPEDANCE VALUE: 399 Ohm
MDC IDC MSMT LEADCHNL LV IMPEDANCE VALUE: 513 Ohm
MDC IDC MSMT LEADCHNL LV IMPEDANCE VALUE: 627 Ohm
MDC IDC MSMT LEADCHNL LV IMPEDANCE VALUE: 627 Ohm
MDC IDC MSMT LEADCHNL LV IMPEDANCE VALUE: 760 Ohm
MDC IDC MSMT LEADCHNL LV IMPEDANCE VALUE: 836 Ohm
MDC IDC MSMT LEADCHNL LV PACING THRESHOLD PULSEWIDTH: 0.4 ms
MDC IDC MSMT LEADCHNL RA PACING THRESHOLD PULSEWIDTH: 0.4 ms
MDC IDC MSMT LEADCHNL RA SENSING INTR AMPL: 3.625 mV
MDC IDC MSMT LEADCHNL RV IMPEDANCE VALUE: 437 Ohm
MDC IDC MSMT LEADCHNL RV SENSING INTR AMPL: 5 mV
MDC IDC MSMT LEADCHNL RV SENSING INTR AMPL: 5 mV
MDC IDC PG IMPLANT DT: 20170627
MDC IDC SET LEADCHNL LV PACING AMPLITUDE: 2.5 V
MDC IDC SET LEADCHNL LV PACING PULSEWIDTH: 0.4 ms
MDC IDC SET LEADCHNL RA PACING AMPLITUDE: 1.5 V
MDC IDC SET LEADCHNL RV PACING PULSEWIDTH: 0.4 ms
MDC IDC STAT BRADY AS VP PERCENT: 99.55 %
MDC IDC STAT BRADY RA PERCENT PACED: 0.05 %

## 2016-01-28 ENCOUNTER — Other Ambulatory Visit: Payer: Self-pay

## 2016-01-28 LAB — COLOGUARD: COLOGUARD: NEGATIVE

## 2016-02-09 ENCOUNTER — Ambulatory Visit (INDEPENDENT_AMBULATORY_CARE_PROVIDER_SITE_OTHER): Payer: Medicare HMO

## 2016-02-09 DIAGNOSIS — Z95810 Presence of automatic (implantable) cardiac defibrillator: Secondary | ICD-10-CM

## 2016-02-09 DIAGNOSIS — I5022 Chronic systolic (congestive) heart failure: Secondary | ICD-10-CM

## 2016-02-09 NOTE — Progress Notes (Signed)
EPIC Encounter for ICM Monitoring  Patient Name: Shelley Douglas is a 66 y.o. female Date: 02/09/2016 Primary Care Physican: Barbette Merino, MD Primary [place] Electrophysiologist: Allred Dry Weight:unknown Bi-V Pacing: 99.1%       Heart Failure questions reviewed, pt asymptomatic   Thoracic impedance normal   Recommendations: No changes. Reminded to limit dietary salt intake to 2000 mg/day and fluid intake to < 2 liters/day. Encouraged to call for fluid symptoms.  Follow-up plan: ICM clinic phone appointment on 03/11/2016.  Copy of ICM check sent to device physician.   3 month ICM trend: 02/09/2016   1 Year ICM trend:      Rosalene Billings, RN 02/09/2016 4:09 PM

## 2016-03-11 ENCOUNTER — Ambulatory Visit (INDEPENDENT_AMBULATORY_CARE_PROVIDER_SITE_OTHER): Payer: Medicare HMO

## 2016-03-11 ENCOUNTER — Telehealth: Payer: Self-pay | Admitting: Cardiology

## 2016-03-11 DIAGNOSIS — Z95810 Presence of automatic (implantable) cardiac defibrillator: Secondary | ICD-10-CM | POA: Diagnosis not present

## 2016-03-11 DIAGNOSIS — I5022 Chronic systolic (congestive) heart failure: Secondary | ICD-10-CM

## 2016-03-11 NOTE — Telephone Encounter (Signed)
LMOVM reminding pt to send remote transmission.   

## 2016-03-12 NOTE — Progress Notes (Signed)
EPIC Encounter for ICM Monitoring  Patient Name: Shelley Douglas is a 66 y.o. female Date: 03/12/2016 Primary Care Physican: Barbette Merino, MD Primary [place] Electrophysiologist: Allred Dry Weight:unknown Bi-V Pacing: 98.3%      Heart Failure questions reviewed, pt asymptomatic    Thoracic impedance normal.  Prescribed and confirmed dosage: Furosemide 40 mg 1 tablet as needed but has been taking it about twice a week.  Advised to take when she has fluid symptoms.   Recommendations: No changes. Reminded to limit dietary salt intake to 2000 mg/day and fluid intake to < 2 liters/day. Encouraged to call for fluid symptoms.  Follow-up plan: ICM clinic phone appointment on 04/12/2016.  Copy of ICM check sent to device physician.   3 month ICM trend: 03/12/2016   1 Year ICM trend:      Rosalene Billings, RN 03/12/2016 12:16 PM

## 2016-04-12 ENCOUNTER — Ambulatory Visit (INDEPENDENT_AMBULATORY_CARE_PROVIDER_SITE_OTHER): Payer: Medicare HMO | Admitting: *Deleted

## 2016-04-12 ENCOUNTER — Telehealth: Payer: Self-pay | Admitting: Cardiology

## 2016-04-12 DIAGNOSIS — Z95810 Presence of automatic (implantable) cardiac defibrillator: Secondary | ICD-10-CM | POA: Diagnosis not present

## 2016-04-12 DIAGNOSIS — I5022 Chronic systolic (congestive) heart failure: Secondary | ICD-10-CM

## 2016-04-12 DIAGNOSIS — I255 Ischemic cardiomyopathy: Secondary | ICD-10-CM

## 2016-04-12 NOTE — Telephone Encounter (Signed)
Spoke with pt and reminded pt of remote transmission that is due today. Pt verbalized understanding.   

## 2016-04-13 NOTE — Progress Notes (Signed)
EPIC Encounter for ICM Monitoring  Patient Name: Shelley Douglas is a 66 y.o. female Date: 04/13/2016 Primary Care Physican: Barbette Merino, MD Primary [place] Electrophysiologist: Allred Dry Weight:unknown Bi-V Pacing: 99.2%      Attempted call to patient and unable to reach.  Left message to return call.  Transmission reviewed.    Thoracic impedance normal.  Prescribed dosage: Furosemide 40 mg 1 tablet as needed but has been taking it about twice a week.    Recommendations: NONE - Unable to reach patient   Follow-up plan: ICM clinic phone appointment on 05/14/2016.    Copy of ICM check sent to device physician.   3 month ICM trend: 04/13/2016   1 Year ICM trend:      Rosalene Billings, RN 04/13/2016 2:56 PM

## 2016-04-13 NOTE — Progress Notes (Signed)
Remote ICD transmission.   

## 2016-04-14 ENCOUNTER — Encounter: Payer: Self-pay | Admitting: Cardiology

## 2016-04-15 LAB — CUP PACEART REMOTE DEVICE CHECK
Battery Remaining Longevity: 85 mo
Battery Voltage: 2.99 V
Brady Statistic AS VS Percent: 0.28 %
Brady Statistic RA Percent Paced: 0.17 %
Brady Statistic RV Percent Paced: 99.16 %
Date Time Interrogation Session: 20180410113724
HIGH POWER IMPEDANCE MEASURED VALUE: 39 Ohm
HIGH POWER IMPEDANCE MEASURED VALUE: 46 Ohm
Implantable Lead Implant Date: 20090720
Implantable Lead Implant Date: 20170627
Implantable Lead Location: 753858
Implantable Lead Location: 753860
Implantable Lead Model: 4598
Implantable Lead Model: 6947
Lead Channel Impedance Value: 323 Ohm
Lead Channel Impedance Value: 399 Ohm
Lead Channel Impedance Value: 456 Ohm
Lead Channel Impedance Value: 532 Ohm
Lead Channel Impedance Value: 532 Ohm
Lead Channel Impedance Value: 589 Ohm
Lead Channel Impedance Value: 646 Ohm
Lead Channel Impedance Value: 779 Ohm
Lead Channel Pacing Threshold Amplitude: 0.75 V
Lead Channel Pacing Threshold Pulse Width: 0.4 ms
Lead Channel Sensing Intrinsic Amplitude: 5.625 mV
Lead Channel Setting Pacing Amplitude: 2 V
Lead Channel Setting Pacing Pulse Width: 0.4 ms
Lead Channel Setting Sensing Sensitivity: 0.3 mV
MDC IDC LEAD IMPLANT DT: 20090720
MDC IDC LEAD LOCATION: 753859
MDC IDC MSMT LEADCHNL LV IMPEDANCE VALUE: 304 Ohm
MDC IDC MSMT LEADCHNL LV IMPEDANCE VALUE: 437 Ohm
MDC IDC MSMT LEADCHNL LV IMPEDANCE VALUE: 817 Ohm
MDC IDC MSMT LEADCHNL LV IMPEDANCE VALUE: 855 Ohm
MDC IDC MSMT LEADCHNL LV PACING THRESHOLD AMPLITUDE: 1.375 V
MDC IDC MSMT LEADCHNL LV PACING THRESHOLD PULSEWIDTH: 0.4 ms
MDC IDC MSMT LEADCHNL RA PACING THRESHOLD AMPLITUDE: 0.5 V
MDC IDC MSMT LEADCHNL RA PACING THRESHOLD PULSEWIDTH: 0.4 ms
MDC IDC MSMT LEADCHNL RA SENSING INTR AMPL: 4.375 mV
MDC IDC MSMT LEADCHNL RA SENSING INTR AMPL: 4.375 mV
MDC IDC MSMT LEADCHNL RV IMPEDANCE VALUE: 380 Ohm
MDC IDC MSMT LEADCHNL RV SENSING INTR AMPL: 5.625 mV
MDC IDC PG IMPLANT DT: 20170627
MDC IDC SET LEADCHNL LV PACING AMPLITUDE: 2.5 V
MDC IDC SET LEADCHNL RA PACING AMPLITUDE: 1.5 V
MDC IDC SET LEADCHNL RV PACING PULSEWIDTH: 0.4 ms
MDC IDC STAT BRADY AP VP PERCENT: 0.16 %
MDC IDC STAT BRADY AP VS PERCENT: 0.01 %
MDC IDC STAT BRADY AS VP PERCENT: 99.54 %

## 2016-04-23 NOTE — Progress Notes (Signed)
ICM remote transmission rescheduled from 05/14/2016 to 05/17/2016.  

## 2016-05-10 ENCOUNTER — Telehealth: Payer: Self-pay

## 2016-05-11 NOTE — Telephone Encounter (Signed)
Spoke with pt regarding shock from 05/09/2016 pt stated that she was standing up talking to a friend and then she felt shock, pt stated that she was feeling fine prior to shock. Asked pt if she had taken her Amiodarone, Coreg and Digoxin, pt stated that she had not taken any of her medications that day. Stressed to pt the importance of taking those medications at the same time everyday in order to keep her heart rate under control. Pt voiced understanding, informed pt that the episode would be reviewed with Dr. Lovena Le, who is one of Dr. Jackalyn Lombard partner and would give her call back if there were any further recommendations. Pt voiced understanding.

## 2016-05-12 ENCOUNTER — Encounter (HOSPITAL_COMMUNITY): Payer: Self-pay | Admitting: General Practice

## 2016-05-12 ENCOUNTER — Observation Stay (HOSPITAL_COMMUNITY)
Admission: EM | Admit: 2016-05-12 | Discharge: 2016-05-14 | Disposition: A | Discharge status: Home / Self Care | Payer: Medicare HMO | Attending: Cardiovascular Disease | Admitting: Cardiovascular Disease

## 2016-05-12 ENCOUNTER — Emergency Department (HOSPITAL_COMMUNITY): Payer: Medicare HMO

## 2016-05-12 DIAGNOSIS — Z4502 Encounter for adjustment and management of automatic implantable cardiac defibrillator: Secondary | ICD-10-CM

## 2016-05-12 DIAGNOSIS — Z888 Allergy status to other drugs, medicaments and biological substances status: Secondary | ICD-10-CM | POA: Insufficient documentation

## 2016-05-12 DIAGNOSIS — Z955 Presence of coronary angioplasty implant and graft: Secondary | ICD-10-CM | POA: Insufficient documentation

## 2016-05-12 DIAGNOSIS — I472 Ventricular tachycardia: Secondary | ICD-10-CM | POA: Insufficient documentation

## 2016-05-12 DIAGNOSIS — I5023 Acute on chronic systolic (congestive) heart failure: Secondary | ICD-10-CM | POA: Diagnosis not present

## 2016-05-12 DIAGNOSIS — I2510 Atherosclerotic heart disease of native coronary artery without angina pectoris: Secondary | ICD-10-CM | POA: Diagnosis not present

## 2016-05-12 DIAGNOSIS — E119 Type 2 diabetes mellitus without complications: Secondary | ICD-10-CM | POA: Insufficient documentation

## 2016-05-12 DIAGNOSIS — R42 Dizziness and giddiness: Secondary | ICD-10-CM | POA: Insufficient documentation

## 2016-05-12 DIAGNOSIS — R002 Palpitations: Secondary | ICD-10-CM | POA: Diagnosis not present

## 2016-05-12 DIAGNOSIS — Z950 Presence of cardiac pacemaker: Secondary | ICD-10-CM | POA: Insufficient documentation

## 2016-05-12 DIAGNOSIS — I509 Heart failure, unspecified: Secondary | ICD-10-CM

## 2016-05-12 DIAGNOSIS — I081 Rheumatic disorders of both mitral and tricuspid valves: Secondary | ICD-10-CM | POA: Diagnosis not present

## 2016-05-12 DIAGNOSIS — Z8249 Family history of ischemic heart disease and other diseases of the circulatory system: Secondary | ICD-10-CM | POA: Diagnosis not present

## 2016-05-12 DIAGNOSIS — I110 Hypertensive heart disease with heart failure: Principal | ICD-10-CM | POA: Insufficient documentation

## 2016-05-12 DIAGNOSIS — I480 Paroxysmal atrial fibrillation: Secondary | ICD-10-CM | POA: Insufficient documentation

## 2016-05-12 DIAGNOSIS — Z86718 Personal history of other venous thrombosis and embolism: Secondary | ICD-10-CM | POA: Diagnosis not present

## 2016-05-12 HISTORY — DX: Pure hypercholesterolemia, unspecified: E78.00

## 2016-05-12 HISTORY — DX: Unspecified convulsions: R56.9

## 2016-05-12 HISTORY — DX: Personal history of other medical treatment: Z92.89

## 2016-05-12 HISTORY — DX: Type 2 diabetes mellitus without complications: E11.9

## 2016-05-12 HISTORY — DX: Presence of automatic (implantable) cardiac defibrillator: Z95.810

## 2016-05-12 HISTORY — DX: Acute myocardial infarction, unspecified: I21.9

## 2016-05-12 HISTORY — DX: Headache: R51

## 2016-05-12 HISTORY — DX: Headache, unspecified: R51.9

## 2016-05-12 HISTORY — DX: Essential (primary) hypertension: I10

## 2016-05-12 LAB — TROPONIN I: Troponin I: <0.03 ng/mL (ref <0.03)

## 2016-05-12 LAB — PROTIME-INR
INR: 1.13
Prothrombin Time: 14.6 seconds (ref 11.4–15.2)

## 2016-05-12 LAB — COMPREHENSIVE METABOLIC PANEL
ALT: 11 U/L — AB (ref 14–54)
AST: 21 U/L (ref 15–41)
Albumin: 3.7 g/dL (ref 3.5–5.0)
Alkaline Phosphatase: 77 U/L (ref 38–126)
Anion gap: 7 (ref 5–15)
BUN: 12 mg/dL (ref 6–20)
CHLORIDE: 104 mmol/L (ref 101–111)
CO2: 24 mmol/L (ref 22–32)
Calcium: 10.2 mg/dL (ref 8.9–10.3)
Creatinine, Ser: 0.9 mg/dL (ref 0.44–1.00)
Glucose, Bld: 99 mg/dL (ref 65–99)
POTASSIUM: 3.9 mmol/L (ref 3.5–5.1)
SODIUM: 135 mmol/L (ref 135–145)
Total Bilirubin: 0.2 mg/dL — ABNORMAL LOW (ref 0.3–1.2)
Total Protein: 7.5 g/dL (ref 6.5–8.1)

## 2016-05-12 LAB — DIFFERENTIAL
BASOS ABS: 0 10*3/uL (ref 0.0–0.1)
BASOS PCT: 1 %
EOS ABS: 0.2 10*3/uL (ref 0.0–0.7)
Eosinophils Relative: 5 %
Lymphocytes Relative: 44 %
Lymphs Abs: 1.7 10*3/uL (ref 0.7–4.0)
MONO ABS: 0.5 10*3/uL (ref 0.1–1.0)
Monocytes Relative: 13 %
NEUTROS ABS: 1.4 10*3/uL — AB (ref 1.7–7.7)
Neutrophils Relative %: 37 %

## 2016-05-12 LAB — CBC
HCT: 35.1 % — ABNORMAL LOW (ref 36.0–46.0)
Hemoglobin: 11.2 g/dL — ABNORMAL LOW (ref 12.0–15.0)
MCH: 27.7 pg (ref 26.0–34.0)
MCHC: 31.9 g/dL (ref 30.0–36.0)
MCV: 86.9 fL (ref 78.0–100.0)
PLATELETS: 199 10*3/uL (ref 150–400)
RBC: 4.04 MIL/uL (ref 3.87–5.11)
RDW: 15.2 % (ref 11.5–15.5)
WBC: 3.8 10*3/uL — AB (ref 4.0–10.5)

## 2016-05-12 LAB — I-STAT CHEM 8, ED
BUN: 17 mg/dL (ref 6–20)
Calcium, Ion: 1.34 mmol/L (ref 1.15–1.40)
Chloride: 101 mmol/L (ref 101–111)
Creatinine, Ser: 0.9 mg/dL (ref 0.44–1.00)
Glucose, Bld: 99 mg/dL (ref 65–99)
HEMATOCRIT: 35 % — AB (ref 36.0–46.0)
HEMOGLOBIN: 11.9 g/dL — AB (ref 12.0–15.0)
POTASSIUM: 4 mmol/L (ref 3.5–5.1)
SODIUM: 139 mmol/L (ref 135–145)
TCO2: 24 mmol/L (ref 0–100)

## 2016-05-12 LAB — BRAIN NATRIURETIC PEPTIDE: B NATRIURETIC PEPTIDE 5: 296.3 pg/mL — AB (ref 0.0–100.0)

## 2016-05-12 LAB — APTT: APTT: 41 s — AB (ref 24–36)

## 2016-05-12 LAB — CBG MONITORING, ED: GLUCOSE-CAPILLARY: 97 mg/dL (ref 65–99)

## 2016-05-12 LAB — I-STAT TROPONIN, ED: TROPONIN I, POC: 0.01 ng/mL (ref 0.00–0.08)

## 2016-05-12 MED ORDER — AMIODARONE HCL 200 MG PO TABS
200.0000 mg | ORAL_TABLET | Freq: Every day | ORAL | Status: DC
  Administered 2016-05-12 – 2016-05-14 (×3): 200 mg via ORAL
  Filled 2016-05-12 (×3): qty 1

## 2016-05-12 MED ORDER — SODIUM CHLORIDE 0.9 % IV SOLN: 250.0000 mL | INTRAVENOUS | Status: DC | PRN

## 2016-05-12 MED ORDER — SODIUM CHLORIDE 0.9% FLUSH
3.0000 mL | Freq: Two times a day (BID) | INTRAVENOUS | Status: DC
  Administered 2016-05-12 – 2016-05-14 (×4): 3 mL via INTRAVENOUS

## 2016-05-12 MED ORDER — DIGOXIN 125 MCG PO TABS
125.0000 ug | ORAL_TABLET | Freq: Every day | ORAL | Status: DC
  Administered 2016-05-12 – 2016-05-14 (×3): 125 ug via ORAL
  Filled 2016-05-12 (×3): qty 1

## 2016-05-12 MED ORDER — MECLIZINE HCL 25 MG PO TABS
25.0000 mg | ORAL_TABLET | Freq: Once | ORAL | Status: AC
  Administered 2016-05-12: 25 mg via ORAL
  Filled 2016-05-12: qty 1

## 2016-05-12 MED ORDER — CARVEDILOL 12.5 MG PO TABS
12.5000 mg | ORAL_TABLET | Freq: Two times a day (BID) | ORAL | Status: DC
  Administered 2016-05-13: 12.5 mg via ORAL
  Filled 2016-05-12: qty 1

## 2016-05-12 MED ORDER — FUROSEMIDE 10 MG/ML IJ SOLN
40.0000 mg | Freq: Two times a day (BID) | INTRAMUSCULAR | Status: DC
  Administered 2016-05-12 – 2016-05-13 (×2): 40 mg via INTRAVENOUS
  Filled 2016-05-12 (×2): qty 4

## 2016-05-12 MED ORDER — LOSARTAN POTASSIUM 25 MG PO TABS
25.0000 mg | ORAL_TABLET | Freq: Every day | ORAL | Status: DC
  Administered 2016-05-12 – 2016-05-14 (×3): 25 mg via ORAL
  Filled 2016-05-12 (×3): qty 1

## 2016-05-12 MED ORDER — MECLIZINE HCL 25 MG PO TABS: 25.0000 mg | ORAL_TABLET | Freq: Every day | ORAL | Status: DC | PRN

## 2016-05-12 MED ORDER — ACETAMINOPHEN 325 MG PO TABS: 650.0000 mg | ORAL_TABLET | ORAL | Status: DC | PRN

## 2016-05-12 MED ORDER — SODIUM CHLORIDE 0.9% FLUSH: 3.0000 mL | INTRAVENOUS | Status: DC | PRN

## 2016-05-12 MED ORDER — APIXABAN 5 MG PO TABS
5.0000 mg | ORAL_TABLET | Freq: Two times a day (BID) | ORAL | Status: DC
  Administered 2016-05-12 – 2016-05-14 (×4): 5 mg via ORAL
  Filled 2016-05-12 (×4): qty 1

## 2016-05-12 MED ORDER — FOLIC ACID 1 MG PO TABS
1.0000 mg | ORAL_TABLET | Freq: Every day | ORAL | Status: DC
  Administered 2016-05-12 – 2016-05-14 (×3): 1 mg via ORAL
  Filled 2016-05-12 (×3): qty 1

## 2016-05-12 MED ORDER — ONDANSETRON HCL 4 MG/2ML IJ SOLN
4.0000 mg | Freq: Four times a day (QID) | INTRAMUSCULAR | Status: DC | PRN
Start: 2016-05-12 — End: 2016-05-14

## 2016-05-12 NOTE — H&P (Signed)
Referring Physician:  SHERRIANN SZUCH is an 66 y.o. female.                       Chief Complaint: Dizziness  HPI: 66 year old female with PMH of atrial fibrillation, CAD, DM, II, hyperthyroidism, Stroke, Chronic left heart systolic failure, Obesity, DVT and pulmonary embolism had episode of dizziness and palpitations with ICD firing on Sunday.   Past Medical History:  Diagnosis Date  . Anxiety   . CAD (coronary artery disease)    last cath 2016  . Diabetes ([place])   . DM (diabetes mellitus) ([place])   . DVT (deep venous thrombosis) ([place]) 1996  . Headache   . Hyperthyroidism   . Ischemic cardiomyopathy   . Left bundle branch block   . Mitral valve regurgitation   . Obesity   . Persistent atrial fibrillation ([place])   . Pulmonary embolism ([place]) 1993  . Stroke [facility]) 2001      Past Surgical History:  Procedure Laterality Date  . CARDIAC CATHETERIZATION N/A 09/27/2014   Procedure: Left Heart Cath and Coronary Angiography;  Surgeon: Dixie Dials, MD;  Location: [facility] CV LAB;  Service: Cardiovascular;  Laterality: N/A;  . CARDIAC DEFIBRILLATOR PLACEMENT  07/24/2007   Medtronic Secura DR implanted in [place] for primary prevention  . CORONARY ANGIOPLASTY WITH STENT PLACEMENT    . CORONARY ARTERY BYPASS GRAFT    . EP IMPLANTABLE DEVICE N/A 07/01/2015   BiV ICD upgrade to Medtronic Viva Quad XT CRT-D device for primary prevention  . HERNIA REPAIR  1990's   . VENA CAVA FILTER PLACEMENT      Family History  Problem Relation Age of Onset  . Hypertension     Social History:  reports that she quit smoking about 28 years ago. She has never used smokeless tobacco. She reports that she does not drink alcohol or use drugs.  Allergies:  Allergies  Allergen Reactions  . Clopidogrel Bisulfate Rash     (Not in a hospital admission)  Results for orders placed or performed during the hospital encounter of 05/12/16 (from the past 48 hour(s))  CBG monitoring, ED     Status: None    Collection Time: 05/12/16  5:03 PM  Result Value Ref Range   Glucose-Capillary 97 65 - 99 mg/dL  Protime-INR     Status: None   Collection Time: 05/12/16  5:25 PM  Result Value Ref Range   Prothrombin Time 14.6 11.4 - 15.2 seconds   INR 1.13   APTT     Status: Abnormal   Collection Time: 05/12/16  5:25 PM  Result Value Ref Range   aPTT 41 (H) 24 - 36 seconds    Comment:        IF BASELINE aPTT IS ELEVATED, SUGGEST PATIENT RISK ASSESSMENT BE USED TO DETERMINE APPROPRIATE ANTICOAGULANT THERAPY.   CBC     Status: Abnormal   Collection Time: 05/12/16  5:25 PM  Result Value Ref Range   WBC 3.8 (L) 4.0 - 10.5 K/uL   RBC 4.04 3.87 - 5.11 MIL/uL   Hemoglobin 11.2 (L) 12.0 - 15.0 g/dL   HCT 35.1 (L) 36.0 - 46.0 %   MCV 86.9 78.0 - 100.0 fL   MCH 27.7 26.0 - 34.0 pg   MCHC 31.9 30.0 - 36.0 g/dL   RDW 15.2 11.5 - 15.5 %   Platelets 199 150 - 400 K/uL  Differential     Status: Abnormal   Collection  Time: 05/12/16  5:25 PM  Result Value Ref Range   Neutrophils Relative % 37 %   Neutro Abs 1.4 (L) 1.7 - 7.7 K/uL   Lymphocytes Relative 44 %   Lymphs Abs 1.7 0.7 - 4.0 K/uL   Monocytes Relative 13 %   Monocytes Absolute 0.5 0.1 - 1.0 K/uL   Eosinophils Relative 5 %   Eosinophils Absolute 0.2 0.0 - 0.7 K/uL   Basophils Relative 1 %   Basophils Absolute 0.0 0.0 - 0.1 K/uL  Comprehensive metabolic panel     Status: Abnormal   Collection Time: 05/12/16  5:25 PM  Result Value Ref Range   Sodium 135 135 - 145 mmol/L   Potassium 3.9 3.5 - 5.1 mmol/L   Chloride 104 101 - 111 mmol/L   CO2 24 22 - 32 mmol/L   Glucose, Bld 99 65 - 99 mg/dL   BUN 12 6 - 20 mg/dL   Creatinine, Ser 0.90 0.44 - 1.00 mg/dL   Calcium 10.2 8.9 - 10.3 mg/dL   Total Protein 7.5 6.5 - 8.1 g/dL   Albumin 3.7 3.5 - 5.0 g/dL   AST 21 15 - 41 U/L   ALT 11 (L) 14 - 54 U/L   Alkaline Phosphatase 77 38 - 126 U/L   Total Bilirubin 0.2 (L) 0.3 - 1.2 mg/dL   GFR calc non Af Amer >60 >60 mL/min   GFR calc Af Amer >60 >60  mL/min    Comment: (NOTE) The eGFR has been calculated using the CKD EPI equation. This calculation has not been validated in all clinical situations. eGFR's persistently <60 mL/min signify possible Chronic Kidney Disease.    Anion gap 7 5 - 15  Brain natriuretic peptide     Status: Abnormal   Collection Time: 05/12/16  5:35 PM  Result Value Ref Range   B Natriuretic Peptide 296.3 (H) 0.0 - 100.0 pg/mL  I-stat troponin, ED     Status: None   Collection Time: 05/12/16  5:37 PM  Result Value Ref Range   Troponin i, poc 0.01 0.00 - 0.08 ng/mL   Comment 3            Comment: Due to the release kinetics of cTnI, a negative result within the first hours of the onset of symptoms does not rule out myocardial infarction with certainty. If myocardial infarction is still suspected, repeat the test at appropriate intervals.   I-Stat Chem 8, ED     Status: Abnormal   Collection Time: 05/12/16  5:39 PM  Result Value Ref Range   Sodium 139 135 - 145 mmol/L   Potassium 4.0 3.5 - 5.1 mmol/L   Chloride 101 101 - 111 mmol/L   BUN 17 6 - 20 mg/dL   Creatinine, Ser 0.90 0.44 - 1.00 mg/dL   Glucose, Bld 99 65 - 99 mg/dL   Calcium, Ion 1.34 1.15 - 1.40 mmol/L   TCO2 24 0 - 100 mmol/L   Hemoglobin 11.9 (L) 12.0 - 15.0 g/dL   HCT 35.0 (L) 36.0 - 46.0 %   Dg Chest 2 View  Result Date: 05/12/2016 CLINICAL DATA:  Acute onset of shortness of breath. Initial encounter. EXAM: CHEST  2 VIEW COMPARISON:  Chest radiograph performed 11/04/2015 FINDINGS: The lungs are well-aerated. Trace right-sided pleural fluid is noted, improved from the prior study. There is no evidence of pleural effusion or pneumothorax. The heart is enlarged. The patient is status post median sternotomy. A pacemaker/AICD is noted at the left chest  wall, with leads ending at the right atrium, right ventricle and coronary sinus. No acute osseous abnormalities are seen. IMPRESSION: 1. Trace right-sided pleural fluid.  Lungs otherwise grossly  clear. 2. Cardiomegaly. Electronically Signed   By: Garald Balding M.D.   On: 05/12/2016 18:19   Ct Head Code Stroke W/o Cm  Result Date: 05/12/2016 CLINICAL DATA:  Code stroke. EXAM: CT HEAD WITHOUT CONTRAST TECHNIQUE: Contiguous axial images were obtained from the base of the skull through the vertex without intravenous contrast. COMPARISON:  Unsteady gait with right-sided weakness FINDINGS: Brain: No mass lesion, intraparenchymal hemorrhage or extra-axial collection. No evidence of acute cortical infarct. Brain parenchyma and CSF-containing spaces are normal for age. Vascular: No hyperdense vessel or unexpected calcification. Skull: Normal visualized skull base, calvarium and extracranial soft tissues. Sinuses/Orbits: No sinus fluid levels or advanced mucosal thickening. No mastoid effusion. Normal orbits. ASPECTS [facility] Stroke Program Early CT Score) - Ganglionic level infarction (caudate, lentiform nuclei, internal capsule, insula, M1-M3 cortex): 7 - Supraganglionic infarction (M4-M6 cortex): 3 Total score (0-10 with 10 being normal): 10 IMPRESSION: 1. Normal head CT. 2. ASPECTS is 10. These results were called by telephone at the time of interpretation on 05/12/2016 at 5:15 pm to Dr. Paschal Dopp, who verbally acknowledged these results. Electronically Signed   By: Ulyses Jarred M.D.   On: 05/12/2016 17:16    Review Of Systems Constitutional: No fever, chills, weight loss or gain. Eyes: No vision change, wears reading glasses. No discharge or pain. Ears: No hearing loss, No tinnitus. Positive dizziness Respiratory: No asthma, COPD, pneumonias. No shortness of breath. No hemoptysis. Cardiovascular: Positive chest pain, palpitation, leg edema. Gastrointestinal: No nausea, vomiting, diarrhea, constipation. No GI bleed. No hepatitis. Genitourinary: No dysuria, hematuria, kidney stone. No incontinance. Neurological: No headache, positive stroke, no seizures.  Psychiatry: No psych facility admission for  anxiety, depression, suicide. No detox. Skin: No rash. Musculoskeletal: Positive joint pain, fibromyalgia. No neck pain, positive back pain. Lymphadenopathy: No lymphadenopathy. Hematology: No anemia or easy bruising.   Blood pressure 124/68, pulse 76, temperature 98.3 F (36.8 C), resp. rate (!) 22, SpO2 98 %. There is no height or weight on file to calculate BMI. General appearance: alert, cooperative, appears stated age and no distress Head: Normocephalic, atraumatic. Eyes: Brown eyes, pink conjunctiva, corneas clear. PERRL, EOM's intact. Neck: No adenopathy, no carotid bruit, no JVD, supple, symmetrical, trachea midline and thyroid not enlarged. Resp: Clear to auscultation bilaterally. Cardio: Regular rate and rhythm, S1, S2 normal, II/VI systolic murmur, no click, rub or gallop GI: Soft, non-tender; bowel sounds normal; no organomegaly. Extremities: Trace edema, cyanosis or clubbing. Skin: Warm and dry.  Neurologic: Alert and oriented X 3, normal strength. Normal coordination and gait.  Assessment/Plan Acute on chronic left heart systolic failure Dizziness Palpitation Paroxysmal Atrial fibrillation with RVR, CHA2DS2VASc score of 6 Paroxysmal VT ICD firing Biventricular pacemaker/ICD CAD S/P RCA stent DM, II H/O DVT and PE  Admit/Blood work to r/o MI, Echocardiogram.  Birdie Riddle, MD  05/12/2016, 8:35 PM

## 2016-05-12 NOTE — ED Provider Notes (Signed)
[facility] DEPT Provider Note   CSN: [id number] Arrival date & time: 05/12/16  1646     History   Chief Complaint Chief Complaint  Patient presents with  . Code Stroke    HPI Shelley Douglas is a 66 y.o. female.  Patient reports that she had onset of vertigo symptoms just prior to arrival. She reports she has had episodes like this in the past. Not associated with position. Endorses a mild headache initially, that is now resolved. No fevers or chills. No infectious symptoms. She also endorses some tinnitus. Prior to my evaluation, the patient was called as a code stroke.   The history is provided by the patient.  Illness  This is a recurrent problem. The current episode started less than 1 hour ago. The problem occurs constantly. The problem has not changed since onset.Associated symptoms include headaches (resolved) and shortness of breath. Pertinent negatives include no chest pain and no abdominal pain. She has tried nothing for the symptoms.    Past Medical History:  Diagnosis Date  . Anxiety   . CAD (coronary artery disease)    last cath 2016  . Diabetes ([place])   . DM (diabetes mellitus) ([place])   . DVT (deep venous thrombosis) ([place]) 1996  . Headache   . Hyperthyroidism   . Ischemic cardiomyopathy   . Left bundle branch block   . Mitral valve regurgitation   . Obesity   . Persistent atrial fibrillation ([place])   . Pulmonary embolism ([place]) 1993  . Stroke [facility]) 2001    Patient Active Problem List   Diagnosis Date Noted  . Acute on chronic left systolic heart failure ([place]) 05/12/2016  . Chronic systolic dysfunction of left ventricle 07/01/2015  . ACS (acute coronary syndrome) ([place]) 12/07/2014  . Acute left systolic heart failure ([place]) 09/27/2014  . Dizziness 09/24/2014  . Routine gynecological examination 02/07/2012  . Ischemic cardiomyopathy 08/07/2010  . 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 PULMONARY EMBOLISM 01/29/2010    . VENTRICULAR TACHYCARDIA 01/29/2010  . Atrial fibrillation ([place]) 01/29/2010  . Chronic systolic congestive heart failure ([place]) 01/29/2010  . Cerebral artery occlusion with cerebral infarction ([place]) 01/29/2010  . IMPLANTATION OF DEFIBRILLATOR, HX OF 01/29/2010    Past Surgical History:  Procedure Laterality Date  . CARDIAC CATHETERIZATION N/A 09/27/2014   Procedure: Left Heart Cath and Coronary Angiography;  Surgeon: Dixie Dials, MD;  Location: [facility] CV LAB;  Service: Cardiovascular;  Laterality: N/A;  . CARDIAC DEFIBRILLATOR PLACEMENT  07/24/2007   Medtronic Secura DR implanted in [place] for primary prevention  . CORONARY ANGIOPLASTY WITH STENT PLACEMENT    . CORONARY ARTERY BYPASS GRAFT    . EP IMPLANTABLE DEVICE N/A 07/01/2015   BiV ICD upgrade to Medtronic Viva Quad XT CRT-D device for primary prevention  . HERNIA REPAIR  1990's   . VENA CAVA FILTER PLACEMENT      OB History    Gravida Para Term Preterm AB Living   4 4 4  0 0 4   SAB TAB Ectopic Multiple Live Births   0 0 0 0         Home Medications    Prior to Admission medications   Medication Sig Start Date End Date Taking? Authorizing Provider  amiodarone (PACERONE) 200 MG tablet Take 1 tablet (200 mg total) by mouth daily. 01/04/15  Yes Dixie Dials, MD  carvedilol (COREG) 12.5 MG tablet Take 12.5 mg by mouth 2 (two) times daily with a  meal.   Yes [provider]  digoxin (LANOXIN) 0.125 MG tablet Take 125 mcg by mouth daily.    Yes [provider]  folic acid (FOLVITE) 1 MG tablet Take 1 mg by mouth daily.    Yes [provider]  furosemide (LASIX) 40 MG tablet Take 40 mg by mouth daily as needed (swelling).   Yes [provider]  losartan (COZAAR) 25 MG tablet Take 25 mg by mouth daily. 04/27/16  Yes [provider]  meclizine (ANTIVERT) 25 MG tablet Take 25 mg by mouth daily as needed for dizziness. 04/27/16  Yes [provider]  nitroGLYCERIN  (NITROSTAT) 0.4 MG SL tablet Place 1 tablet (0.4 mg total) under the tongue every 5 (five) minutes x 3 doses as needed for chest pain. Patient not taking: Reported on 05/12/2016 06/20/15   Thompson Grayer, MD    Family History Family History  Problem Relation Age of Onset  . Hypertension      Social History Social History  Substance Use Topics  . Smoking status: Former Smoker    Quit date: 01/05/1988  . Smokeless tobacco: Never Used  . Alcohol use No     Allergies   Clopidogrel bisulfate   Review of Systems Review of Systems  Constitutional: Negative for chills and fever.  Eyes: Negative for visual disturbance.  Respiratory: Positive for shortness of breath. Negative for cough.   Cardiovascular: Negative for chest pain and palpitations.  Gastrointestinal: Negative for abdominal pain, diarrhea, nausea and vomiting.  Neurological: Positive for dizziness (vertigo) and headaches (resolved). Negative for facial asymmetry, speech difficulty, weakness and numbness.  All other systems reviewed and are negative.    Physical Exam Updated Vital Signs BP 138/76 (BP Location: Right Arm)   Pulse 76   Temp 97.6 F (36.4 C) (Oral)   Resp 16   SpO2 98%   Physical Exam  Constitutional: She is oriented to person, place, and time. She appears well-developed and well-nourished. No distress.  HENT:  Head: Normocephalic and atraumatic.  Eyes: Conjunctivae are normal.  Neck: Neck supple.  Cardiovascular: Normal rate and regular rhythm.   No murmur heard. Pulmonary/Chest: Effort normal and breath sounds normal. No respiratory distress. She has no wheezes. She has no rhonchi.  Abdominal: Soft. There is no tenderness.  Musculoskeletal:       Right lower leg: She exhibits swelling and edema.       Left lower leg: She exhibits swelling and edema.  Neurological: She is alert and oriented to person, place, and time. She has normal strength. No cranial nerve deficit or sensory deficit. She  displays a negative Romberg sign. Coordination and gait normal. GCS eye subscore is 4. GCS verbal subscore is 5. GCS motor subscore is 6.  Skin: Skin is warm and dry.  Psychiatric: She has a normal mood and affect.  Nursing note and vitals reviewed.    ED Treatments / Results  Labs (all labs ordered are listed, but only abnormal results are displayed) Labs Reviewed  APTT - Abnormal; Notable for the following:       Result Value   aPTT 41 (*)    All other components within normal limits  CBC - Abnormal; Notable for the following:    WBC 3.8 (*)    Hemoglobin 11.2 (*)    HCT 35.1 (*)    All other components within normal limits  DIFFERENTIAL - Abnormal; Notable for the following:    Neutro Abs 1.4 (*)    All  other components within normal limits  COMPREHENSIVE METABOLIC PANEL - Abnormal; Notable for the following:    ALT 11 (*)    Total Bilirubin 0.2 (*)    All other components within normal limits  BRAIN NATRIURETIC PEPTIDE - Abnormal; Notable for the following:    B Natriuretic Peptide 296.3 (*)    All other components within normal limits  I-STAT CHEM 8, ED - Abnormal; Notable for the following:    Hemoglobin 11.9 (*)    HCT 35.0 (*)    All other components within normal limits  PROTIME-INR  TROPONIN I  TROPONIN I  TROPONIN I  BASIC METABOLIC PANEL  I-STAT TROPOININ, ED  CBG MONITORING, ED    EKG  EKG Interpretation  Date/Time:  Wednesday May 12 2016 16:55:09 EDT Ventricular Rate:  80 PR Interval:  158 QRS Duration: 118 QT Interval:  398 QTC Calculation: 459 R Axis:   -144 Text Interpretation:  Atrial-sensed ventricular-paced rhythm with occasional Premature ventricular complexes Biventricular pacemaker detected Abnormal ECG No significant change since last tracing Confirmed by Theotis Burrow [phone]) on 05/12/2016 6:31:15 PM       Radiology Dg Chest 2 View  Result Date: 05/12/2016 CLINICAL DATA:  Acute onset of shortness of breath. Initial encounter. EXAM:  CHEST  2 VIEW COMPARISON:  Chest radiograph performed 11/04/2015 FINDINGS: The lungs are well-aerated. Trace right-sided pleural fluid is noted, improved from the prior study. There is no evidence of pleural effusion or pneumothorax. The heart is enlarged. The patient is status post median sternotomy. A pacemaker/AICD is noted at the left chest wall, with leads ending at the right atrium, right ventricle and coronary sinus. No acute osseous abnormalities are seen. IMPRESSION: 1. Trace right-sided pleural fluid.  Lungs otherwise grossly clear. 2. Cardiomegaly. Electronically Signed   By: Garald Balding M.D.   On: 05/12/2016 18:19   Ct Head Code Stroke W/o Cm  Result Date: 05/12/2016 CLINICAL DATA:  Code stroke. EXAM: CT HEAD WITHOUT CONTRAST TECHNIQUE: Contiguous axial images were obtained from the base of the skull through the vertex without intravenous contrast. COMPARISON:  Unsteady gait with right-sided weakness FINDINGS: Brain: No mass lesion, intraparenchymal hemorrhage or extra-axial collection. No evidence of acute cortical infarct. Brain parenchyma and CSF-containing spaces are normal for age. Vascular: No hyperdense vessel or unexpected calcification. Skull: Normal visualized skull base, calvarium and extracranial soft tissues. Sinuses/Orbits: No sinus fluid levels or advanced mucosal thickening. No mastoid effusion. Normal orbits. ASPECTS [facility] Stroke Program Early CT Score) - Ganglionic level infarction (caudate, lentiform nuclei, internal capsule, insula, M1-M3 cortex): 7 - Supraganglionic infarction (M4-M6 cortex): 3 Total score (0-10 with 10 being normal): 10 IMPRESSION: 1. Normal head CT. 2. ASPECTS is 10. These results were called by telephone at the time of interpretation on 05/12/2016 at 5:15 pm to Dr. Paschal Dopp, who verbally acknowledged these results. Electronically Signed   By: Ulyses Jarred M.D.   On: 05/12/2016 17:16    Procedures Procedures (including critical care time)  Medications  Ordered in ED Medications  sodium chloride flush (NS) 0.9 % injection 3 mL (not administered)  sodium chloride flush (NS) 0.9 % injection 3 mL (not administered)  0.9 %  sodium chloride infusion (not administered)  furosemide (LASIX) injection 40 mg (40 mg Intravenous Given 05/12/16 2105)  acetaminophen (TYLENOL) tablet 650 mg (not administered)  ondansetron (ZOFRAN) injection 4 mg (not administered)  amiodarone (PACERONE) tablet 200 mg (not administered)  carvedilol (COREG) tablet 12.5 mg (not administered)  digoxin (LANOXIN) tablet 125 mcg (  not administered)  folic acid (FOLVITE) tablet 1 mg (not administered)  losartan (COZAAR) tablet 25 mg (not administered)  meclizine (ANTIVERT) tablet 25 mg (not administered)  apixaban (ELIQUIS) tablet 5 mg (not administered)  meclizine (ANTIVERT) tablet 25 mg (25 mg Oral Given 05/12/16 1936)     Initial Impression / Assessment and Plan / ED Course  I have reviewed the triage vital signs and the nursing notes.  Pertinent labs & imaging results that were available during my care of the patient were reviewed by me and considered in my medical decision making (see chart for details).     As far as the vertigo symptoms, feel this is consistent with a peripheral vertigo. She has had the sensation multiple times in the past. The fact that she has tinnitus associated with the vertigo, much less likely to be a central cause. She initially was called as a code stroke. CT of the patient's head without acute intracranial pathology. Per neurology, do not feel this is a stroke. Stroke was canceled. The patient a dose of meclizine here with improvement in symptoms. She has no focal neurological deficits.  On review of systems, patient mentioned that several days ago she felt her defibrillator fire. We interrogated her Medtronic device. There was evidence of an episode of atrial fibrillation as well as ventricular fibrillation on 5/6 that lasted for 10 seconds, and  then the patient received a shock. He was also evidence of multiple episodes of nonsustained ventricular tachycardia, atrial fibrillation, as well as SVT. EKG here is consistent with prior. Troponin is negative. BNP is mildly elevated. Spoke with the patient's cardiologist. Will admit to them.  Final Clinical Impressions(s) / ED Diagnoses   Final diagnoses:  Defibrillator discharge    New Prescriptions Current Discharge Medication List       Maryan Puls, MD 05/12/16 [place], Wenda Overland, MD 05/20/16 [phone]

## 2016-05-12 NOTE — ED Notes (Signed)
Pt to Xray.

## 2016-05-12 NOTE — Progress Notes (Signed)
Arrival Method: bed with ED tech, ambulated to unit bed Mental Orientation: alert and oriented x4 Telemetry: initiated, CCMD called, ICD Skin: scar on chest/abdomen, BLE edema Pain: "slight headache" Safety Measures: high fall risk- bracelet, socks. Admission: husband at bedside  Orders to be reviewed and implemented. Will continue to monitor the patient. Call light has been placed within reach and bed alarm has been activated.   Riki Altes, RN Phone: [phone]

## 2016-05-12 NOTE — ED Notes (Signed)
Pt assisted to bathroom with wheelchair.  Able to move from bed to wheelchair to toilet without difficulty.  States dizziness improved.

## 2016-05-12 NOTE — ED Triage Notes (Signed)
Pt and family member reports onset approx 4:30 of dizziness, "ringing in her ears" and unsteady gait. Reports feeling like she is leaning to the right. Grips seem equal, no arm drift noted, speech clear.

## 2016-05-12 NOTE — ED Notes (Signed)
Code stroke cancelled by neurologist.  States CT neg, exam does not show stroke.

## 2016-05-12 NOTE — ED Notes (Signed)
Defibrillator interrogated.  Awaiting response.

## 2016-05-13 ENCOUNTER — Inpatient Hospital Stay (HOSPITAL_COMMUNITY): Payer: Medicare HMO

## 2016-05-13 DIAGNOSIS — I5023 Acute on chronic systolic (congestive) heart failure: Secondary | ICD-10-CM | POA: Diagnosis not present

## 2016-05-13 DIAGNOSIS — Z950 Presence of cardiac pacemaker: Secondary | ICD-10-CM | POA: Diagnosis not present

## 2016-05-13 DIAGNOSIS — I110 Hypertensive heart disease with heart failure: Secondary | ICD-10-CM | POA: Diagnosis not present

## 2016-05-13 DIAGNOSIS — I509 Heart failure, unspecified: Secondary | ICD-10-CM

## 2016-05-13 DIAGNOSIS — I2510 Atherosclerotic heart disease of native coronary artery without angina pectoris: Secondary | ICD-10-CM | POA: Diagnosis not present

## 2016-05-13 LAB — ECHOCARDIOGRAM COMPLETE
CHL CUP DOP CALC LVOT VTI: 21 cm
CHL CUP LVOT MV VTI INDEX: 0.54 cm2/m2
CHL CUP MV M VEL: 111
CHL CUP STROKE VOLUME: 56 mL
CHL CUP TV REG PEAK VELOCITY: 248 cm/s
EERAT: 60.74
EWDT: 602 ms
FS: 14 % — AB (ref 28–44)
HEIGHTINCHES: 67 in
IV/PV OW: 1.1
LA ID, A-P, ES: 35 mm
LA diam index: 1.7 cm/m2
LA vol A4C: 86.3 ml
LA vol index: 35.5 mL/m2
LA vol: 73.2 mL
LDCA: 3.46 cm2
LEFT ATRIUM END SYS DIAM: 35 mm
LV TDI E'LATERAL: 2.7
LV TDI E'MEDIAL: 3.88
LV dias vol index: 88 mL/m2
LV dias vol: 182 mL — AB (ref 46–106)
LVEEAVG: 60.74
LVEEMED: 60.74
LVELAT: 2.7 cm/s
LVOT MV VTI: 1.12
LVOT SV: 73 mL
LVOT peak grad rest: 3 mmHg
LVOTD: 21 mm
LVOTPV: 91.4 cm/s
LVSYSVOL: 126 mL — AB (ref 14–42)
LVSYSVOLIN: 61 mL/m2
Lateral S' vel: 7.65 cm/s
MV Annulus VTI: 65.1 cm
MV Dec: 602
MV Peak grad: 11 mmHg
MV pk E vel: 164 m/s
MVAP: 1.67 cm2
MVPKAVEL: 94.3 m/s
MVSPHT: 132 ms
Mean grad: 6 mmHg
PW: 10 mm — AB (ref 0.6–1.1)
Simpson's disk: 31
TAPSE: 15.8 mm
TR max vel: 248 cm/s
WEIGHTICAEL: 3326.4 [oz_av]

## 2016-05-13 LAB — GLUCOSE, CAPILLARY
GLUCOSE-CAPILLARY: 103 mg/dL — AB (ref 65–99)
Glucose-Capillary: 116 mg/dL — ABNORMAL HIGH (ref 65–99)
Glucose-Capillary: 166 mg/dL — ABNORMAL HIGH (ref 65–99)

## 2016-05-13 LAB — BASIC METABOLIC PANEL
Anion gap: 7 (ref 5–15)
BUN: 12 mg/dL (ref 6–20)
CHLORIDE: 104 mmol/L (ref 101–111)
CO2: 26 mmol/L (ref 22–32)
CREATININE: 0.93 mg/dL (ref 0.44–1.00)
Calcium: 10.2 mg/dL (ref 8.9–10.3)
GFR calc non Af Amer: >60 mL/min (ref >60)
Glucose, Bld: 98 mg/dL (ref 65–99)
POTASSIUM: 4.2 mmol/L (ref 3.5–5.1)
SODIUM: 137 mmol/L (ref 135–145)

## 2016-05-13 LAB — TROPONIN I: Troponin I: <0.03 ng/mL (ref <0.03)

## 2016-05-13 MED ORDER — FUROSEMIDE 20 MG PO TABS
20.0000 mg | ORAL_TABLET | Freq: Every day | ORAL | Status: DC
  Filled 2016-05-13: qty 1

## 2016-05-13 MED ORDER — CARVEDILOL 25 MG PO TABS
25.0000 mg | ORAL_TABLET | Freq: Two times a day (BID) | ORAL | Status: DC
  Administered 2016-05-13 – 2016-05-14 (×2): 25 mg via ORAL
  Filled 2016-05-13 (×2): qty 1

## 2016-05-13 MED ORDER — MAGNESIUM OXIDE 400 (241.3 MG) MG PO TABS
400.0000 mg | ORAL_TABLET | Freq: Every day | ORAL | Status: DC
  Administered 2016-05-13 – 2016-05-14 (×2): 400 mg via ORAL
  Filled 2016-05-13 (×2): qty 1

## 2016-05-13 MED ORDER — POTASSIUM CHLORIDE CRYS ER 10 MEQ PO TBCR
10.0000 meq | EXTENDED_RELEASE_TABLET | Freq: Every day | ORAL | Status: DC
Start: 2016-05-13 — End: 2016-05-14
  Administered 2016-05-13: 10 meq via ORAL
  Filled 2016-05-13 (×2): qty 1

## 2016-05-13 NOTE — Progress Notes (Signed)
VSS during 7 a to 7 p shift, patient with no real complaints.  Denies dizziness but says her head feels "funny" at times.  Did walk in room and sit up in the chair for much of the shift.  Family at bedside.

## 2016-05-13 NOTE — Progress Notes (Signed)
Ref: Elwyn Reach, MD   Subjective:  Awake. VS stable. Episodes of non-sustained VT.   Objective:  Vital Signs in the last 24 hours: Temp:  [97.7 F (36.5 C)-98.3 F (36.8 C)] 97.9 F (36.6 C) (05/10 1630) Pulse Rate:  [60-88] 70 (05/10 1630) Cardiac Rhythm: Ventricular paced (05/10 1914) Resp:  [16-20] 20 (05/10 1630) BP: (102-139)/(53-69) 102/53 (05/10 1630) SpO2:  [97 %-99 %] 98 % (05/10 1630) Weight:  [94.3 kg (207 lb 14.4 oz)] 94.3 kg (207 lb 14.4 oz) (05/09 2200)  Physical Exam: BP Readings from Last 1 Encounters:  05/13/16 (!) 102/53    Wt Readings from Last 1 Encounters:  05/12/16 94.3 kg (207 lb 14.4 oz)    Weight change:  Body mass index is 32.56 kg/m. HEENT: [place]/AT, Eyes-Brown, PERL, EOMI, Conjunctiva-Pink, Sclera-Non-icteric Neck: No JVD, No bruit, Trachea midline. Lungs:  Clear, Bilateral. Cardiac:  Regular rhythm, normal S1 and S2, no S3. II/VI systolic murmur. Abdomen:  Soft, non-tender. BS present. Extremities:  No edema present. No cyanosis. No clubbing. CNS: AxOx3, Cranial nerves grossly intact, moves all 4 extremities.  Skin: Warm and dry.   Intake/Output from previous day: 05/09 0701 - 05/10 0700 In: 663 [P.O.:660; I.V.:3] Out: 2000 [Urine:2000]    Lab Results: BMET    Component Value Date/Time   NA 137 05/13/2016 0716   NA 139 05/12/2016 1739   NA 135 05/12/2016 1725   K 4.2 05/13/2016 0716   K 4.0 05/12/2016 1739   K 3.9 05/12/2016 1725   CL 104 05/13/2016 0716   CL 101 05/12/2016 1739   CL 104 05/12/2016 1725   CO2 26 05/13/2016 0716   CO2 24 05/12/2016 1725   CO2 29 06/20/2015 1021   GLUCOSE 98 05/13/2016 0716   GLUCOSE 99 05/12/2016 1739   GLUCOSE 99 05/12/2016 1725   BUN 12 05/13/2016 0716   BUN 17 05/12/2016 1739   BUN 12 05/12/2016 1725   CREATININE 0.93 05/13/2016 0716   CREATININE 0.90 05/12/2016 1739   CREATININE 0.90 05/12/2016 1725   CREATININE 0.93 06/20/2015 1021   CALCIUM 10.2 05/13/2016 0716   CALCIUM 10.2  05/12/2016 1725   CALCIUM 10.8 (H) 06/20/2015 1021   GFRNONAA >60 05/13/2016 0716   GFRNONAA >60 05/12/2016 1725   GFRNONAA 45 (L) 01/03/2015 0600   GFRAA >60 05/13/2016 0716   GFRAA >60 05/12/2016 1725   GFRAA 52 (L) 01/03/2015 0600   CBC    Component Value Date/Time   WBC 3.8 (L) 05/12/2016 1725   RBC 4.04 05/12/2016 1725   HGB 11.9 (L) 05/12/2016 1739   HCT 35.0 (L) 05/12/2016 1739   PLT 199 05/12/2016 1725   MCV 86.9 05/12/2016 1725   MCH 27.7 05/12/2016 1725   MCHC 31.9 05/12/2016 1725   RDW 15.2 05/12/2016 1725   LYMPHSABS 1.7 05/12/2016 1725   MONOABS 0.5 05/12/2016 1725   EOSABS 0.2 05/12/2016 1725   BASOSABS 0.0 05/12/2016 1725   HEPATIC Function Panel  Recent Labs  05/12/16 1725  PROT 7.5   HEMOGLOBIN A1C No components found for: HGA1C,  MPG CARDIAC ENZYMES Lab Results  Component Value Date   TROPONINI <0.03 05/13/2016   TROPONINI <0.03 05/13/2016   TROPONINI <0.03 05/12/2016   BNP No results for input(s): PROBNP in the last 8760 hours. TSH No results for input(s): TSH in the last 8760 hours. CHOLESTEROL No results for input(s): CHOL in the last 8760 hours.  Scheduled Meds: . amiodarone  200 mg Oral Daily  . apixaban  5 mg Oral BID  . carvedilol  25 mg Oral BID WC  . digoxin  125 mcg Oral Daily  . folic acid  1 mg Oral Daily  . [START ON 05/14/2016] furosemide  20 mg Oral Daily  . losartan  25 mg Oral Daily  . magnesium oxide  400 mg Oral Daily  . potassium chloride  10 mEq Oral Daily  . sodium chloride flush  3 mL Intravenous Q12H   Continuous Infusions: . sodium chloride     PRN Meds:.sodium chloride, acetaminophen, meclizine, ondansetron (ZOFRAN) IV, sodium chloride flush  Assessment/Plan: Acute on chronic left heart systolic failure Dizziness Palpitation Non-sustained VT Paroxysmal  Atrial fibrillation with RVR, CHA2DVASc score of 6 ICD firing Biventricular pacemaker/ICD CAD S/P RCA stent DM, II H/O DVT and PE  Increase Coreg  to 25 mg. twice daily. Add magnesium and potassium with small dose of lasix daily. Patient encouraged to use cane or walker.   LOS: 1 day    Dixie Dials  MD  05/13/2016, 9:30 PM

## 2016-05-13 NOTE — Care Management Note (Signed)
Case Management Note  Patient Details  Name: Shelley Douglas MRN: [id number] Date of Birth: [birth year]  Subjective/Objective:  Admitted with CHF                 Action/Plan: Patient is independent of all of her ADL's; continues to drive her car; has private insurance with [facility] Medicare with prescription drug coverage. No DME; No needs identified at this time. CM will continue to follow up for DCP  Expected Discharge Date:    05/13/2016              Expected Discharge Plan:  Home/Self Care  Discharge planning Services  CM Consult  Status of Service:  In process, will continue to follow  Sherrilyn Rist [phone] 05/13/2016, 3:18 PM

## 2016-05-13 NOTE — Progress Notes (Signed)
  Echocardiogram 2D Echocardiogram has been performed.  Shelley Douglas 05/13/2016, 1:06 PM

## 2016-05-13 NOTE — Care Management CC44 (Signed)
Condition Code 44 Documentation Completed  Patient Details  Name: Shelley Douglas MRN: [id number] Date of Birth: [birth year]   Condition Code 44 given:  Yes Patient signature on Condition Code 44 notice:  Yes Documentation of 2 MD's agreement:  Yes Code 44 added to claim:  Yes    Royston Bake, RN 05/13/2016, 3:17 PM

## 2016-05-13 NOTE — Progress Notes (Signed)
Patient had a 9 beat run of V tach, asymptomatic, sitting up in chair watching TV.  Notified Dr. Doylene Canard, will continue to monitor.

## 2016-05-13 NOTE — Care Management Obs Status (Signed)
[facility] NOTIFICATION   Patient Details  Name: Shelley Douglas MRN: [id number] Date of Birth: 1950-09-01   Medicare Observation Status Notification Given:  Yes    Royston Bake, RN 05/13/2016, 3:17 PM

## 2016-05-13 NOTE — Discharge Instructions (Signed)
Information on my medicine - ELIQUIS® (apixaban) ° °This medication education was reviewed with me or my healthcare representative as part of my discharge preparation.  The pharmacist that spoke with me during my hospital stay was:  Krrish Freund Kay, RPH ° °Why was Eliquis® prescribed for you? °Eliquis® was prescribed for you to reduce the risk of forming blood clots that can cause a stroke if you have a medical condition called atrial fibrillation (a type of irregular heartbeat) OR to reduce the risk of a blood clots forming after orthopedic surgery. ° °What do You need to know about Eliquis® ? °Take your Eliquis® TWICE DAILY - one tablet in the morning and one tablet in the evening with or without food.  It would be best to take the doses about the same time each day. ° °If you have difficulty swallowing the tablet whole please discuss with your pharmacist how to take the medication safely. ° °Take Eliquis® exactly as prescribed by your doctor and DO NOT stop taking Eliquis® without talking to the doctor who prescribed the medication.  Stopping may increase your risk of developing a new clot or stroke.  Refill your prescription before you run out. ° °After discharge, you should have regular check-up appointments with your healthcare provider that is prescribing your Eliquis®.  In the future your dose may need to be changed if your kidney function or weight changes by a significant amount or as you get older. ° °What do you do if you miss a dose? °If you miss a dose, take it as soon as you remember on the same day and resume taking twice daily.  Do not take more than one dose of ELIQUIS at the same time. ° °Important Safety Information °A possible side effect of Eliquis® is bleeding. You should call your healthcare provider right away if you experience any of the following: °? Bleeding from an injury or your nose that does not stop. °? Unusual colored urine (red or dark brown) or unusual colored stools (red or  black). °? Unusual bruising for unknown reasons. °? A serious fall or if you hit your head (even if there is no bleeding). ° °Some medicines may interact with Eliquis® and might increase your risk of bleeding or clotting while on Eliquis®. To help avoid this, consult your healthcare provider or pharmacist prior to using any new prescription or non-prescription medications, including herbals, vitamins, non-steroidal anti-inflammatory drugs (NSAIDs) and supplements. ° °This website has more information on Eliquis® (apixaban): [email or web address].com. ° ° °

## 2016-05-13 NOTE — Plan of Care (Signed)
Problem: Tissue Perfusion: Goal: Risk factors for ineffective tissue perfusion will decrease Outcome: Progressing Maintains oxygen saturations in 90's on room air   Problem: Fluid Volume: Goal: Ability to maintain a balanced intake and output will improve Outcome: Progressing Receiving IV lasix   Problem: Education: Goal: Ability to demonstrate managment of disease process will improve Outcome: Progressing Educated patient regarding heart failure based off living with heart failure booklet; Educated on the importance of daily weights, patient says she needs to get new scales; also discussed the zone tool and how to use this at home; patient verbalized uderstanding

## 2016-05-14 LAB — GLUCOSE, CAPILLARY
GLUCOSE-CAPILLARY: 115 mg/dL — AB (ref 65–99)
Glucose-Capillary: 109 mg/dL — ABNORMAL HIGH (ref 65–99)
Glucose-Capillary: 117 mg/dL — ABNORMAL HIGH (ref 65–99)

## 2016-05-14 LAB — LIPID PANEL
CHOL/HDL RATIO: 4.6 ratio
CHOLESTEROL: 241 mg/dL — AB (ref 0–200)
HDL: 52 mg/dL (ref >40)
LDL Cholesterol: 164 mg/dL — ABNORMAL HIGH (ref 0–99)
TRIGLYCERIDES: 124 mg/dL (ref <150)
VLDL: 25 mg/dL (ref 0–40)

## 2016-05-14 LAB — BASIC METABOLIC PANEL
Anion gap: 8 (ref 5–15)
BUN: 17 mg/dL (ref 6–20)
CALCIUM: 10.2 mg/dL (ref 8.9–10.3)
CO2: 23 mmol/L (ref 22–32)
CREATININE: 1.23 mg/dL — AB (ref 0.44–1.00)
Chloride: 104 mmol/L (ref 101–111)
GFR calc Af Amer: 52 mL/min — ABNORMAL LOW (ref >60)
GFR, EST NON AFRICAN AMERICAN: 45 mL/min — AB (ref >60)
GLUCOSE: 131 mg/dL — AB (ref 65–99)
Potassium: 4.7 mmol/L (ref 3.5–5.1)
Sodium: 135 mmol/L (ref 135–145)

## 2016-05-14 LAB — MAGNESIUM: Magnesium: 2 mg/dL (ref 1.7–2.4)

## 2016-05-14 MED ORDER — CARVEDILOL 25 MG PO TABS: 25.0000 mg | ORAL_TABLET | Freq: Two times a day (BID) | ORAL | 3 refills | Status: DC

## 2016-05-14 MED ORDER — FUROSEMIDE 40 MG PO TABS: 40.0000 mg | ORAL_TABLET | ORAL | Status: DC

## 2016-05-14 MED ORDER — MAGNESIUM OXIDE 400 (241.3 MG) MG PO TABS: 400.0000 mg | ORAL_TABLET | Freq: Every day | ORAL | 1 refills | Status: DC

## 2016-05-14 MED ORDER — APIXABAN 5 MG PO TABS
5.0000 mg | ORAL_TABLET | Freq: Two times a day (BID) | ORAL | 3 refills | Status: DC
Start: 2016-05-14 — End: 2022-07-27

## 2016-05-14 NOTE — Progress Notes (Signed)
RN notified by CCMD that Pt. Had 5 beats of V Tach.. Pt. Alert and in stable condition at the time. VSS. No s/s of distress or discomfort noted. Doylene Canard, MD paged to make aware.

## 2016-05-14 NOTE — Discharge Summary (Signed)
Physician Discharge Summary  Patient ID: Shelley Douglas MRN: [id number] DOB/AGE: April 11, 1950 66 y.o.  Admit date: 05/12/2016 Discharge date: 05/14/2016  Admission Diagnoses: Acute on chronic left heart systolic failure Dizziness Palpitation Biventricular pacemaker/ICD CAD S/P RCA stent DM, II H/O DVT and PE Paroxysmal atrial fibrillation Non-sustained VT  Discharge Diagnoses:  Principle problem: *Acute on chronic left heart systolic failure* Active Problems:   Dizziness   Palpitation   Biventricular pacemaker/ICD   ICD firing for A.fib with RVR and VT   CAD   S/P RCA stent   DM, II   H/O DVT and PE   Paroxysmal atrial fibrillation, CHA2DS2VASc score of 6   Non-sustained VT  Discharged Condition: fair  Hospital Course: 66 year old female with chronic left heart systolic failure had episodes of dizziness and palpitations with ICD firing. She had weakness and shortness of breath with activity. She improved with IV lasix use and PO magnesium and potassium were added. Her carvedilol was increased to improve heart rate and rhythm control. 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 She will see me in 1 week and primary care in 1 month.  Consults: cardiology  Significant Diagnostic Studies: labs: Normal BMET and near normal CBC, troponin-I. Mildly elevated BNP.  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  Echocardiogram: Dilated LV with 25-30 % EF. MIld MS. Moderate MR and TR.  Treatments: cardiac meds: carvedilol, digoxin, furosemide, amiodarone and losartan.  Discharge Exam: Blood pressure 132/69, pulse 70, temperature 97.9 F (36.6 C), temperature source Oral, resp. rate 18, height 5\' 7"  (1.702 m), weight 94 kg (207 lb 3.2 oz), SpO2 91 %. General appearance: alert, cooperative and appears stated age. Head: Normocephalic, atraumatic. Eyes: Brown eyes, pink conjunctiva,  corneas clear. PERRL, EOM's intact.  Neck: No adenopathy, no carotid bruit, no JVD, supple, symmetrical, trachea midline and thyroid not enlarged. Resp: Clear to auscultation bilaterally. Cardio: Regular rate and rhythm, S1, S2 normal, II/VI systolic murmur, no click, rub or gallop. GI: Soft, non-tender; bowel sounds normal; no organomegaly. Extremities: No edema, cyanosis or clubbing. Skin: Warm and dry.  Neurologic: Alert and oriented X 3, normal strength and tone. Normal coordination and slow gait.  Disposition: 01-Home or Self Care   Allergies as of 05/14/2016      Reactions   Clopidogrel Bisulfate Rash      Medication List    TAKE these medications   amiodarone 200 MG tablet Commonly known as:  PACERONE Take 1 tablet (200 mg total) by mouth daily.   apixaban 5 MG Tabs tablet Commonly known as:  ELIQUIS Take 1 tablet (5 mg total) by mouth 2 (two) times daily.   carvedilol 25 MG tablet Commonly known as:  COREG Take 1 tablet (25 mg total) by mouth 2 (two) times daily with a meal. What changed:  medication strength  how much to take   digoxin 0.125 MG tablet Commonly known as:  LANOXIN Take 125 mcg by mouth daily.   folic acid 1 MG tablet Commonly known as:  FOLVITE Take 1 mg by mouth daily.   furosemide 40 MG tablet Commonly known as:  LASIX Take 1 tablet (40 mg total) by mouth every Monday, Wednesday, and Friday. What changed:  when to take this  reasons to take this   losartan 25 MG tablet Commonly known as:  COZAAR Take 25 mg by mouth daily.   magnesium oxide 400 (241.3 Mg)  MG tablet Commonly known as:  MAG-OX Take 1 tablet (400 mg total) by mouth daily. Start taking on:  05/15/2016   meclizine 25 MG tablet Commonly known as:  ANTIVERT Take 25 mg by mouth daily as needed for dizziness.   nitroGLYCERIN 0.4 MG SL tablet Commonly known as:  NITROSTAT Place 1 tablet (0.4 mg total) under the tongue every 5 (five) minutes x 3 doses as needed for  chest pain.      Follow-up Information    Elwyn Reach, MD. Schedule an appointment as soon as possible for a visit in 1 month(s).   Specialty:  Internal Medicine Contact information: [place]. [place] [address] [phone]        Dixie Dials, MD. Schedule an appointment as soon as possible for a visit in 1 week(s).   Specialty:  Cardiology Contact information: [place] [address] [phone]           Signed: Birdie Riddle 05/14/2016, 10:07 AM

## 2016-05-14 NOTE — Progress Notes (Signed)
Heart Failure Navigator Consult Note  Presentation: Shelley Douglas is a 66 year old female with PMH of atrial fibrillation, CAD, DM, II, hyperthyroidism, Stroke, Chronic left heart systolic failure, Obesity, DVT and pulmonary embolism had episode of dizziness and palpitations with ICD firing on Sunday.    Past Medical History:  Diagnosis Date  . AICD (automatic cardioverter/defibrillator) present   . Anxiety    pt denies this hx on 05/12/2016  . CAD (coronary artery disease)    last cath 2016  . Daily headache   . DVT (deep venous thrombosis) (HCC) 1996  . High cholesterol   . History of blood transfusion    "related to pregnancy" (05/12/2016)  . Hypertension    "just started taking RX" (05/12/2016)  . Ischemic cardiomyopathy   . Left bundle branch block   . Mitral valve regurgitation   . Myocardial infarction (HCC) ~ 2009 X2  . Obesity   . Persistent atrial fibrillation (HCC)   . Pulmonary embolism (HCC) 1993  . Seizures (HCC) 1970s   "I had 1; don\'t know what it was from" (05/12/2016)  . Stroke (HCC) 2001   denies residual on 05/12/2016  . Type II diabetes mellitus (HCC)     Social History   Social History  . Marital status: Married    Spouse name: N/A  . Number of children: N/A  . Years of education: N/A   Occupational History  . disabled    Social History Main Topics  . Smoking status: Former Smoker    Years: 4.00    Types: Cigarettes    Quit date: 01/05/1988  . Smokeless tobacco: Never Used     Comment: 05/12/2016 "someday smoker when I did smoked; never smoked much"  . Alcohol use No  . Drug use: No  . Sexual activity: Not Currently   Other Topics Concern  . None   Social History Narrative   Pt lives in [place] with spouse.   Disabled       ECHO:Study Conclusions-05/13/16 - Left ventricle: The cavity size was mildly dilated. There was   mild concentric hypertrophy. Systolic function was severely   reduced. The estimated ejection fraction was in the  range of 25%   to 30%. There is severe hypokinesis of the entireanteroseptal and   inferoseptal myocardium. There is mild hypokinesis of the   anterolateral and inferolateral myocardium. Doppler parameters   are consistent with abnormal left ventricular relaxation (grade 1   diastolic dysfunction). - Mitral valve: Severely calcified annulus. Mildly thickened,   mildly calcified leaflets . Mobility of the posterior leaflet was   moderately restricted. Transvalvular velocity was increased. The   findings are consistent with mild to moderate stenosis. There was   moderate regurgitation. Valve area by pressure half-time: 1.67   cm^2. Valve area by continuity equation (using LVOT flow): 1.12   cm^2. - Left atrium: The atrium was moderately to severely dilated. - Right atrium: The atrium was mildly dilated. - Tricuspid valve: There was moderate regurgitation.  ------------------------------------------------------------------- Study data:  Comparison was made to the study of 09/25/2014.  Study status:  Routine.  Procedure:  The patient reported no pain pre or post test. Transthoracic echocardiography. Image quality was adequate.  Study completion:  There were no complications. Transthoracic echocardiography.  M-mode, complete 2D, spectral Doppler, and color Doppler.  Birthdate:  Patient birthdate: 03/25/1950.  Age:  Patient is 66 yr old.  Sex:  Gender: female. BMI: 32.6 kg/m^2.  Blood pressure:     11 7/53  Patient status: Inpatient.  Study date:  Study date: 05/13/2016. Study time: 12:12 PM.  Location:  Bedside.  BNP    Component Value Date/Time   BNP 296.3 (H) 05/12/2016 1735    ProBNP No results found for: PROBNP   Education Assessment and Provision:  Detailed education and instructions provided on heart failure disease management including the following:  Signs and symptoms of Heart Failure When to call the physician Importance of daily weights Low sodium diet Fluid  restriction Medication management Anticipated future follow-up appointments  Patient education given on each of the above topics.  Patient acknowledges understanding and acceptance of all instructions.  I spoke with [name] regarding her HF diagnosis and current hospitalization.  She tells me that she has not been taking her medications and has not seen the physician (Dr Doylene Canard) in a while due to financial constraints.  I reviewed the importance of daily weights and when to contact the physician.  She tells me that she does not have a scale and I have provided one for her.  I reinforced the need to take all prescribed medications.  I also reviewed a low sodium diet and high sodium foods to avoid.  She will follow-up with Dr Doylene Canard but could benefit from [facility] for ongoing support and referral to the Clinic LCSW for financial resources.    Education Materials:  "Living Better With Heart Failure" Booklet, Daily Weight Tracker Tool    High Risk Criteria for Readmission and/or Poor Patient Outcomes:  (Recommend Follow-up with [facility])--yes   EF <30%-20 -25%  2 or more admissions in 6 months- No  Difficult social situation- yes-admits that she lacks finances for co-pays for medications and office follow-up visits  Demonstrates medication noncompliance- Yes    Barriers of Care:  Finances, knowledge and compliance Discharge Planning:   Plans to return home on [place] with husband

## 2016-05-17 ENCOUNTER — Ambulatory Visit (INDEPENDENT_AMBULATORY_CARE_PROVIDER_SITE_OTHER): Payer: Medicare HMO

## 2016-05-17 ENCOUNTER — Telehealth: Payer: Self-pay

## 2016-05-17 DIAGNOSIS — Z95810 Presence of automatic (implantable) cardiac defibrillator: Secondary | ICD-10-CM

## 2016-05-17 DIAGNOSIS — I5022 Chronic systolic (congestive) heart failure: Secondary | ICD-10-CM

## 2016-05-17 NOTE — Telephone Encounter (Signed)
Remote ICM transmission received.  Attempted patient call and left message to return call regarding transmission.    

## 2016-05-17 NOTE — Progress Notes (Signed)
EPIC Encounter for ICM Monitoring  Patient Name: Shelley Douglas is a 66 y.o. female Date: 05/17/2016 Primary Care Physican: Elwyn Reach, MD Primary [place] Electrophysiologist: Allred Dry Weight:unknown Bi-V Pacing: 98.5%      Attempted call to patient and unable to reach.  Left message to return call regarding transmission.  Transmission reviewed.  Patient hospitalized 5/9 to 5/11 and stroke was ruled out.  She had device shock on 05/11/2016.  She had not taken her medications that day.    Thoracic impedance abnormal suggesting fluid accumulation since yesterday, 05/16/2016.  Prescribed and confirmed dosage: Furosemide 40 mg 1 tablet as needed but has been taking it about twice a week.  Advised to take when she has fluid symptoms.   Labs: 05/14/2016 Creatinine 1.23, BUN 17, Potassium 4.7, Sodium 135, EGFR 45-52 05/13/2016 Creatinine 0.93, BUN 12, Potassium 4.2, Sodium 137, EGFR >60  05/12/2016 Creatinine 0.90, BUN 12, Potassium 3.9, Sodium 135, EGFR >60   Recommendations: NONE - Unable to reach patient   Follow-up plan: ICM clinic phone appointment on 05/27/2016 to recheck fluid levels  Copy of ICM check sent to device physician.   3 month ICM trend: 05/17/2016   1 Year ICM trend:      Rosalene Billings, RN 05/17/2016 10:45 AM

## 2016-05-18 ENCOUNTER — Telehealth (HOSPITAL_COMMUNITY): Payer: Self-pay | Admitting: Surgery

## 2016-05-18 NOTE — Telephone Encounter (Signed)
I called and left a message with Shelley Douglas regarding her recent hospitalization.  I asked her to call me back with any concerns or questions regarding her HF.

## 2016-05-26 ENCOUNTER — Telehealth: Payer: Self-pay

## 2016-05-26 NOTE — Telephone Encounter (Signed)
LVM for pt stating that Dr. Rayann Heman was following up from the shock she received on 05/09/2016. Left number to device clinic for pt to call back if she felt like she needed an appointment.

## 2016-05-28 NOTE — Progress Notes (Signed)
No ICM remote transmission received for 05/27/2016 and next ICM transmission scheduled for 06/17/2016.

## 2016-06-01 ENCOUNTER — Other Ambulatory Visit: Payer: Self-pay | Admitting: Internal Medicine

## 2016-06-15 ENCOUNTER — Telehealth: Payer: Self-pay

## 2016-06-15 NOTE — Telephone Encounter (Signed)
Error

## 2016-06-17 ENCOUNTER — Telehealth: Payer: Self-pay

## 2016-06-17 ENCOUNTER — Ambulatory Visit (INDEPENDENT_AMBULATORY_CARE_PROVIDER_SITE_OTHER): Payer: Medicare HMO

## 2016-06-17 DIAGNOSIS — Z95810 Presence of automatic (implantable) cardiac defibrillator: Secondary | ICD-10-CM

## 2016-06-17 DIAGNOSIS — I5022 Chronic systolic (congestive) heart failure: Secondary | ICD-10-CM

## 2016-06-17 NOTE — Progress Notes (Signed)
EPIC Encounter for ICM Monitoring  Patient Name: Shelley Douglas is a 66 y.o. female Date: 06/17/2016 Primary Care Physican: Elwyn Reach, MD Primary [place] Electrophysiologist: Allred Dry Weight:unknown Bi-V Pacing: 98%      Attempted call to patient and unable to reach.  Left message to return call regarding transmission.  Transmission reviewed.    Thoracic impedance abnormal suggesting fluid accumulation.  Prescribed and confirmed dosage: Furosemide 40 mg 1 tablet as needed but has been taking it about twice a week. Advised to take when she has fluid symptoms.   Labs: 05/14/2016 Creatinine 1.23, BUN 17, Potassium 4.7, Sodium 135, EGFR 45-52 05/13/2016 Creatinine 0.93, BUN 12, Potassium 4.2, Sodium 137, EGFR >60  05/12/2016 Creatinine 0.90, BUN 12, Potassium 3.9, Sodium 135, EGFR >60   Recommendations: NONE - Unable to reach patient   Follow-up plan: ICM clinic phone appointment on 06/24/2016 to recheck fluid levels.    Copy of ICM check sent to device physician.   3 month ICM trend: 06/17/2016   1 Year ICM trend:      Rosalene Billings, RN 06/17/2016 8:10 AM

## 2016-06-17 NOTE — Telephone Encounter (Signed)
Remote ICM transmission received.  Attempted patient call and left message to return call.   

## 2016-06-24 ENCOUNTER — Ambulatory Visit (INDEPENDENT_AMBULATORY_CARE_PROVIDER_SITE_OTHER): Payer: Self-pay

## 2016-06-24 DIAGNOSIS — Z95810 Presence of automatic (implantable) cardiac defibrillator: Secondary | ICD-10-CM

## 2016-06-24 DIAGNOSIS — I5022 Chronic systolic (congestive) heart failure: Secondary | ICD-10-CM

## 2016-06-25 ENCOUNTER — Telehealth: Payer: Self-pay

## 2016-06-25 NOTE — Progress Notes (Signed)
EPIC Encounter for ICM Monitoring  Patient Name: KIARA MCDOWELL is a 66 y.o. female Date: 06/25/2016 Primary Care Physican: Elwyn Reach, MD Primary [place] Electrophysiologist: Allred Dry Weight:unknown Bi-V Pacing: 98.2%         Attempted call to patient and unable to reach.  Transmission reviewed.    Thoracic impedance returned to normal since last ICM check on 06/17/2016  Prescribed dosage: Furosemide 40 mg 1 tablet as needed but takes it about twice a week.   Labs: 05/14/2016 Creatinine 1.23, BUN 17, Potassium 4.7, Sodium 135, EGFR 45-52 05/13/2016 Creatinine 0.93, BUN 12, Potassium 4.2, Sodium 137, EGFR >60  05/12/2016 Creatinine 0.90, BUN 12, Potassium 3.9, Sodium 135, EGFR >60   Recommendations: NONE - Unable to reach patient   Follow-up plan: ICM clinic phone appointment on 07/20/2016.  Copy of ICM check sent to device physician.   3 month ICM trend: 06/25/2016   1 Year ICM trend:      Rosalene Billings, RN 06/25/2016 12:33 PM

## 2016-06-25 NOTE — Telephone Encounter (Signed)
Remote ICM transmission received.  Attempted patient call and no message. 

## 2016-07-20 ENCOUNTER — Telehealth: Payer: Self-pay

## 2016-07-20 ENCOUNTER — Ambulatory Visit (INDEPENDENT_AMBULATORY_CARE_PROVIDER_SITE_OTHER): Payer: Medicare HMO | Admitting: *Deleted

## 2016-07-20 DIAGNOSIS — Z95810 Presence of automatic (implantable) cardiac defibrillator: Secondary | ICD-10-CM

## 2016-07-20 DIAGNOSIS — I255 Ischemic cardiomyopathy: Secondary | ICD-10-CM

## 2016-07-20 DIAGNOSIS — I5022 Chronic systolic (congestive) heart failure: Secondary | ICD-10-CM | POA: Diagnosis not present

## 2016-07-20 NOTE — Telephone Encounter (Signed)
Remote ICM transmission received.  Attempted patient call and left detailed message regarding transmission and next ICM scheduled for 08/20/2016.  Advised to return call for any fluid symptoms or questions.

## 2016-07-20 NOTE — Progress Notes (Signed)
EPIC Encounter for ICM Monitoring  Patient Name: Shelley Douglas is a 66 y.o. female Date: 07/20/2016 Primary Care Physican: Elwyn Reach, MD Primary [place] Electrophysiologist: Allred Dry Weight:unknown Bi-V Pacing: 98.8%      Attempted call to patient and unable to reach.  Left detailed message regarding transmission.  Transmission reviewed.    Thoracic impedance normal.  Prescribed dosage: Furosemide 40 mg 1 tablet as needed but takes it about twice a week.   Labs: 05/14/2016 Creatinine 1.23, BUN 17, Potassium 4.7, Sodium 135, EGFR 45-52 05/13/2016 Creatinine 0.93, BUN 12, Potassium 4.2, Sodium 137, EGFR >60  05/12/2016 Creatinine 0.90, BUN 12, Potassium 3.9, Sodium 135, EGFR >60   Recommendations: Left voice mail with ICM number and encouraged to call for fluid symptoms.  Follow-up plan: ICM clinic phone appointment on 08/20/2016.  Copy of ICM check sent to device physician.   3 month ICM trend: 07/20/2016   1 Year ICM trend:      Rosalene Billings, RN 07/20/2016 1:59 PM

## 2016-07-20 NOTE — Progress Notes (Signed)
ICD Remote transmission received 

## 2016-07-21 ENCOUNTER — Encounter: Payer: Self-pay | Admitting: Cardiology

## 2016-07-22 LAB — CUP PACEART REMOTE DEVICE CHECK
Battery Remaining Longevity: 83 mo
Battery Voltage: 3 V
Brady Statistic AP VP Percent: 1.19 %
Brady Statistic AS VP Percent: 98.66 %
Brady Statistic RA Percent Paced: 1.2 %
Date Time Interrogation Session: 20180717073323
HIGH POWER IMPEDANCE MEASURED VALUE: 41 Ohm
HIGH POWER IMPEDANCE MEASURED VALUE: 48 Ohm
Implantable Lead Implant Date: 20170627
Implantable Lead Location: 753859
Implantable Lead Model: 4598
Implantable Lead Model: 6947
Implantable Pulse Generator Implant Date: 20170627
Lead Channel Impedance Value: 380 Ohm
Lead Channel Impedance Value: 494 Ohm
Lead Channel Impedance Value: 570 Ohm
Lead Channel Impedance Value: 570 Ohm
Lead Channel Impedance Value: 855 Ohm
Lead Channel Pacing Threshold Pulse Width: 0.4 ms
Lead Channel Pacing Threshold Pulse Width: 0.4 ms
Lead Channel Pacing Threshold Pulse Width: 0.4 ms
Lead Channel Sensing Intrinsic Amplitude: 5.5 mV
Lead Channel Sensing Intrinsic Amplitude: 5.5 mV
Lead Channel Setting Pacing Amplitude: 1.5 V
Lead Channel Setting Pacing Amplitude: 2.5 V
Lead Channel Setting Pacing Pulse Width: 0.4 ms
MDC IDC LEAD IMPLANT DT: 20090720
MDC IDC LEAD IMPLANT DT: 20090720
MDC IDC LEAD LOCATION: 753858
MDC IDC LEAD LOCATION: 753860
MDC IDC MSMT LEADCHNL LV IMPEDANCE VALUE: 304 Ohm
MDC IDC MSMT LEADCHNL LV IMPEDANCE VALUE: 380 Ohm
MDC IDC MSMT LEADCHNL LV IMPEDANCE VALUE: 494 Ohm
MDC IDC MSMT LEADCHNL LV IMPEDANCE VALUE: 570 Ohm
MDC IDC MSMT LEADCHNL LV IMPEDANCE VALUE: 817 Ohm
MDC IDC MSMT LEADCHNL LV IMPEDANCE VALUE: 855 Ohm
MDC IDC MSMT LEADCHNL LV PACING THRESHOLD AMPLITUDE: 1.5 V
MDC IDC MSMT LEADCHNL RA PACING THRESHOLD AMPLITUDE: 0.5 V
MDC IDC MSMT LEADCHNL RA SENSING INTR AMPL: 5.25 mV
MDC IDC MSMT LEADCHNL RA SENSING INTR AMPL: 5.25 mV
MDC IDC MSMT LEADCHNL RV IMPEDANCE VALUE: 342 Ohm
MDC IDC MSMT LEADCHNL RV IMPEDANCE VALUE: 437 Ohm
MDC IDC MSMT LEADCHNL RV PACING THRESHOLD AMPLITUDE: 0.75 V
MDC IDC SET LEADCHNL RV PACING AMPLITUDE: 2 V
MDC IDC SET LEADCHNL RV PACING PULSEWIDTH: 0.4 ms
MDC IDC SET LEADCHNL RV SENSING SENSITIVITY: 0.3 mV
MDC IDC STAT BRADY AP VS PERCENT: 0.01 %
MDC IDC STAT BRADY AS VS PERCENT: 0.13 %
MDC IDC STAT BRADY RV PERCENT PACED: 98.77 %

## 2016-08-20 ENCOUNTER — Telehealth: Payer: Self-pay

## 2016-08-20 ENCOUNTER — Ambulatory Visit (INDEPENDENT_AMBULATORY_CARE_PROVIDER_SITE_OTHER): Payer: Medicare HMO

## 2016-08-20 DIAGNOSIS — I5022 Chronic systolic (congestive) heart failure: Secondary | ICD-10-CM | POA: Diagnosis not present

## 2016-08-20 DIAGNOSIS — Z95810 Presence of automatic (implantable) cardiac defibrillator: Secondary | ICD-10-CM

## 2016-08-20 NOTE — Progress Notes (Signed)
EPIC Encounter for ICM Monitoring  Patient Name: Shelley Douglas is a 66 y.o. female Date: 08/20/2016 Primary Care Physican: Elwyn Reach, MD Primary [place] Electrophysiologist: Allred Dry Weight:unknown Bi-V Pacing: 99.1%       Attempted call to patient and unable to reach.  Left detailed message regarding transmission.  Transmission reviewed.    Thoracic impedance normal.  Prescribed dosage: Furosemide 40 mg 1 tablet as needed but takes itabout twice a week.   Labs: 05/14/2016 Creatinine 1.23, BUN 17, Potassium 4.7, Sodium 135, EGFR 45-52 05/13/2016 Creatinine 0.93, BUN 12, Potassium 4.2, Sodium 137, EGFR >60  05/12/2016 Creatinine 0.90, BUN 12, Potassium 3.9, Sodium 135, EGFR >60   Recommendations: Left voice mail with ICM number and encouraged to call for fluid symptoms.  Follow-up plan: ICM clinic phone appointment on 09/20/2016.  Recall for October appointment with Chanetta Marshall, NP  Copy of ICM check sent to device physician.   3 month ICM trend: 08/20/2016   1 Year ICM trend:      Rosalene Billings, RN 08/20/2016 9:01 AM

## 2016-08-20 NOTE — Telephone Encounter (Signed)
Remote ICM transmission received.  Attempted patient call and left detailed message regarding transmission and next ICM scheduled for 09/20/2016.  Advised to return call for any fluid symptoms or questions.    

## 2016-09-20 ENCOUNTER — Ambulatory Visit (INDEPENDENT_AMBULATORY_CARE_PROVIDER_SITE_OTHER): Payer: Medicare HMO

## 2016-09-20 DIAGNOSIS — I5022 Chronic systolic (congestive) heart failure: Secondary | ICD-10-CM | POA: Diagnosis not present

## 2016-09-20 DIAGNOSIS — Z95810 Presence of automatic (implantable) cardiac defibrillator: Secondary | ICD-10-CM | POA: Diagnosis not present

## 2016-09-20 NOTE — Progress Notes (Signed)
EPIC Encounter for ICM Monitoring  Patient Name: Shelley Douglas is a 66 y.o. female Date: 09/20/2016 Primary Care Physican: Elwyn Reach, MD Primary [place] Electrophysiologist: Allred Dry Weight:unknown Bi-V Pacing: 99.2%        Attempted call to patient and unable to reach.  Left detailed message regarding transmission.  Transmission reviewed.    Thoracic impedance normal.  Prescribed dosage: Furosemide 40 mg 1 tablet as needed but takes itabout twice a week.   Labs: 05/14/2016 Creatinine 1.23, BUN 17, Potassium 4.7, Sodium 135, EGFR 45-52 05/13/2016 Creatinine 0.93, BUN 12, Potassium 4.2, Sodium 137, EGFR >60  05/12/2016 Creatinine 0.90, BUN 12, Potassium 3.9, Sodium 135, EGFR >60   Recommendations: Left voice mail with ICM number and encouraged to call for fluid symptoms.  Follow-up plan: ICM clinic phone appointment on 10/21/2016.  Recall for October appointment with Chanetta Marshall, NP  Copy of ICM check sent to Dr. Rayann Heman.   3 month ICM trend: 09/20/2016   1 Year ICM trend:      Rosalene Billings, RN 09/20/2016 9:35 AM

## 2016-09-27 ENCOUNTER — Other Ambulatory Visit: Payer: Self-pay | Admitting: Cardiovascular Disease

## 2016-09-27 DIAGNOSIS — Z1231 Encounter for screening mammogram for malignant neoplasm of breast: Secondary | ICD-10-CM

## 2016-10-21 ENCOUNTER — Ambulatory Visit (INDEPENDENT_AMBULATORY_CARE_PROVIDER_SITE_OTHER): Payer: Medicare HMO | Admitting: *Deleted

## 2016-10-21 ENCOUNTER — Telehealth: Payer: Self-pay

## 2016-10-21 DIAGNOSIS — Z95810 Presence of automatic (implantable) cardiac defibrillator: Secondary | ICD-10-CM | POA: Diagnosis not present

## 2016-10-21 DIAGNOSIS — I255 Ischemic cardiomyopathy: Secondary | ICD-10-CM

## 2016-10-21 DIAGNOSIS — I5022 Chronic systolic (congestive) heart failure: Secondary | ICD-10-CM | POA: Diagnosis not present

## 2016-10-21 NOTE — Progress Notes (Signed)
EPIC Encounter for ICM Monitoring  Patient Name: Shelley Douglas is a 66 y.o. female Date: 10/21/2016 Primary Care Physican: Elwyn Reach, MD Primary [place] Electrophysiologist: Allred Dry Weight:unknown Bi-V Pacing: 98.9%       Attempted call to patient and unable to reach.  Left detailed message regarding transmission.  Transmission reviewed.    Thoracic impedance normal.  Prescribed dosage: Furosemide 40 mg 1 tablet as needed but takes itabout twice a week.   Labs: 05/14/2016 Creatinine 1.23, BUN 17, Potassium 4.7, Sodium 135, EGFR 45-52 05/13/2016 Creatinine 0.93, BUN 12, Potassium 4.2, Sodium 137, EGFR >60  05/12/2016 Creatinine 0.90, BUN 12, Potassium 3.9, Sodium 135, EGFR >60   Recommendations: Left voice mail with ICM number and encouraged to call if experiencing any fluid symptoms.  Follow-up plan: ICM clinic phone appointment on 11/22/2016.    Copy of ICM check sent to Dr. Rayann Heman.   3 month ICM trend: 10/21/2016   1 Year ICM trend:      Rosalene Billings, RN 10/21/2016 12:59 PM

## 2016-10-21 NOTE — Progress Notes (Signed)
Remote ICD transmission.   

## 2016-10-21 NOTE — Telephone Encounter (Signed)
Remote ICM transmission received.  Attempted call to patient and left detailed message per patient's permission regarding transmission and next ICM scheduled for 11/22/2016.  Advised to return call for any fluid symptoms or questions.    

## 2016-10-28 ENCOUNTER — Encounter: Payer: Self-pay | Admitting: Cardiology

## 2016-11-04 ENCOUNTER — Ambulatory Visit: Payer: Medicare HMO

## 2016-11-07 ENCOUNTER — Emergency Department (HOSPITAL_COMMUNITY): Payer: Medicare HMO

## 2016-11-07 ENCOUNTER — Other Ambulatory Visit: Payer: Self-pay

## 2016-11-07 ENCOUNTER — Encounter (HOSPITAL_COMMUNITY): Payer: Self-pay | Admitting: Emergency Medicine

## 2016-11-07 ENCOUNTER — Inpatient Hospital Stay (HOSPITAL_COMMUNITY)
Admission: EM | Admit: 2016-11-07 | Discharge: 2016-11-11 | DRG: 291 | Disposition: A | Discharge status: Home / Self Care | Payer: Medicare HMO | Attending: Cardiovascular Disease | Admitting: Cardiovascular Disease

## 2016-11-07 DIAGNOSIS — E8770 Fluid overload, unspecified: Secondary | ICD-10-CM

## 2016-11-07 DIAGNOSIS — Z955 Presence of coronary angioplasty implant and graft: Secondary | ICD-10-CM | POA: Diagnosis not present

## 2016-11-07 DIAGNOSIS — I5023 Acute on chronic systolic (congestive) heart failure: Secondary | ICD-10-CM | POA: Diagnosis present

## 2016-11-07 DIAGNOSIS — R0602 Shortness of breath: Secondary | ICD-10-CM

## 2016-11-07 DIAGNOSIS — Z87891 Personal history of nicotine dependence: Secondary | ICD-10-CM | POA: Diagnosis not present

## 2016-11-07 DIAGNOSIS — Z86718 Personal history of other venous thrombosis and embolism: Secondary | ICD-10-CM | POA: Diagnosis not present

## 2016-11-07 DIAGNOSIS — Z951 Presence of aortocoronary bypass graft: Secondary | ICD-10-CM | POA: Diagnosis not present

## 2016-11-07 DIAGNOSIS — Z95810 Presence of automatic (implantable) cardiac defibrillator: Secondary | ICD-10-CM | POA: Diagnosis not present

## 2016-11-07 DIAGNOSIS — I480 Paroxysmal atrial fibrillation: Secondary | ICD-10-CM | POA: Diagnosis present

## 2016-11-07 DIAGNOSIS — N182 Chronic kidney disease, stage 2 (mild): Secondary | ICD-10-CM | POA: Diagnosis present

## 2016-11-07 DIAGNOSIS — E1122 Type 2 diabetes mellitus with diabetic chronic kidney disease: Secondary | ICD-10-CM | POA: Diagnosis present

## 2016-11-07 DIAGNOSIS — I252 Old myocardial infarction: Secondary | ICD-10-CM | POA: Diagnosis not present

## 2016-11-07 DIAGNOSIS — Z8673 Personal history of transient ischemic attack (TIA), and cerebral infarction without residual deficits: Secondary | ICD-10-CM | POA: Diagnosis not present

## 2016-11-07 DIAGNOSIS — E669 Obesity, unspecified: Secondary | ICD-10-CM | POA: Diagnosis present

## 2016-11-07 DIAGNOSIS — E1165 Type 2 diabetes mellitus with hyperglycemia: Secondary | ICD-10-CM | POA: Diagnosis present

## 2016-11-07 DIAGNOSIS — Z86711 Personal history of pulmonary embolism: Secondary | ICD-10-CM | POA: Diagnosis not present

## 2016-11-07 DIAGNOSIS — E7800 Pure hypercholesterolemia, unspecified: Secondary | ICD-10-CM | POA: Diagnosis present

## 2016-11-07 DIAGNOSIS — I255 Ischemic cardiomyopathy: Secondary | ICD-10-CM | POA: Diagnosis present

## 2016-11-07 DIAGNOSIS — Z6835 Body mass index (BMI) 35.0-35.9, adult: Secondary | ICD-10-CM

## 2016-11-07 DIAGNOSIS — Z7901 Long term (current) use of anticoagulants: Secondary | ICD-10-CM | POA: Diagnosis not present

## 2016-11-07 DIAGNOSIS — I472 Ventricular tachycardia: Secondary | ICD-10-CM | POA: Diagnosis present

## 2016-11-07 DIAGNOSIS — I130 Hypertensive heart and chronic kidney disease with heart failure and stage 1 through stage 4 chronic kidney disease, or unspecified chronic kidney disease: Principal | ICD-10-CM | POA: Diagnosis present

## 2016-11-07 DIAGNOSIS — I2510 Atherosclerotic heart disease of native coronary artery without angina pectoris: Secondary | ICD-10-CM | POA: Diagnosis present

## 2016-11-07 DIAGNOSIS — Z4502 Encounter for adjustment and management of automatic implantable cardiac defibrillator: Secondary | ICD-10-CM

## 2016-11-07 DIAGNOSIS — T502X5A Adverse effect of carbonic-anhydrase inhibitors, benzothiadiazides and other diuretics, initial encounter: Secondary | ICD-10-CM | POA: Diagnosis present

## 2016-11-07 LAB — HEPATIC FUNCTION PANEL
ALBUMIN: 3.3 g/dL — AB (ref 3.5–5.0)
ALT: 15 U/L (ref 14–54)
AST: 22 U/L (ref 15–41)
Alkaline Phosphatase: 97 U/L (ref 38–126)
Bilirubin, Direct: <0.1 mg/dL — ABNORMAL LOW (ref 0.1–0.5)
TOTAL PROTEIN: 7.8 g/dL (ref 6.5–8.1)
Total Bilirubin: 0.6 mg/dL (ref 0.3–1.2)

## 2016-11-07 LAB — CBC
HCT: 35.8 % — ABNORMAL LOW (ref 36.0–46.0)
HEMOGLOBIN: 11.2 g/dL — AB (ref 12.0–15.0)
MCH: 27.9 pg (ref 26.0–34.0)
MCHC: 31.3 g/dL (ref 30.0–36.0)
MCV: 89.3 fL (ref 78.0–100.0)
Platelets: 299 10*3/uL (ref 150–400)
RBC: 4.01 MIL/uL (ref 3.87–5.11)
RDW: 14.8 % (ref 11.5–15.5)
WBC: 7.9 10*3/uL (ref 4.0–10.5)

## 2016-11-07 LAB — BASIC METABOLIC PANEL
ANION GAP: 9 (ref 5–15)
BUN: 15 mg/dL (ref 6–20)
CALCIUM: 10.2 mg/dL (ref 8.9–10.3)
CO2: 23 mmol/L (ref 22–32)
Chloride: 106 mmol/L (ref 101–111)
Creatinine, Ser: 1.15 mg/dL — ABNORMAL HIGH (ref 0.44–1.00)
GFR calc Af Amer: 56 mL/min — ABNORMAL LOW (ref >60)
GFR calc non Af Amer: 48 mL/min — ABNORMAL LOW (ref >60)
GLUCOSE: 193 mg/dL — AB (ref 65–99)
Potassium: 4.5 mmol/L (ref 3.5–5.1)
Sodium: 138 mmol/L (ref 135–145)

## 2016-11-07 LAB — I-STAT TROPONIN, ED: TROPONIN I, POC: 0.01 ng/mL (ref 0.00–0.08)

## 2016-11-07 LAB — LIPASE, BLOOD: Lipase: 20 U/L (ref 11–51)

## 2016-11-07 LAB — TSH: TSH: 0.515 u[IU]/mL (ref 0.350–4.500)

## 2016-11-07 LAB — BRAIN NATRIURETIC PEPTIDE: B Natriuretic Peptide: 920.3 pg/mL — ABNORMAL HIGH (ref 0.0–100.0)

## 2016-11-07 LAB — TROPONIN I
Troponin I: <0.03 ng/mL (ref <0.03)
Troponin I: <0.03 ng/mL (ref <0.03)

## 2016-11-07 LAB — DIGOXIN LEVEL

## 2016-11-07 LAB — MAGNESIUM: MAGNESIUM: 1.9 mg/dL (ref 1.7–2.4)

## 2016-11-07 MED ORDER — ASPIRIN EC 81 MG PO TBEC
81.0000 mg | DELAYED_RELEASE_TABLET | Freq: Every day | ORAL | Status: DC
  Administered 2016-11-07 – 2016-11-11 (×5): 81 mg via ORAL
  Filled 2016-11-07 (×5): qty 1

## 2016-11-07 MED ORDER — SODIUM CHLORIDE 0.9% FLUSH
3.0000 mL | Freq: Two times a day (BID) | INTRAVENOUS | Status: DC
  Administered 2016-11-07 – 2016-11-10 (×7): 3 mL via INTRAVENOUS

## 2016-11-07 MED ORDER — AMIODARONE HCL 200 MG PO TABS
200.0000 mg | ORAL_TABLET | Freq: Every day | ORAL | Status: DC
  Administered 2016-11-07 – 2016-11-11 (×5): 200 mg via ORAL
  Filled 2016-11-07 (×5): qty 1

## 2016-11-07 MED ORDER — SODIUM CHLORIDE 0.9% FLUSH: 3.0000 mL | INTRAVENOUS | Status: DC | PRN

## 2016-11-07 MED ORDER — FUROSEMIDE 10 MG/ML IJ SOLN
40.0000 mg | Freq: Two times a day (BID) | INTRAMUSCULAR | Status: DC
  Administered 2016-11-07 – 2016-11-08 (×3): 40 mg via INTRAVENOUS
  Filled 2016-11-07 (×3): qty 4

## 2016-11-07 MED ORDER — ALBUTEROL SULFATE (2.5 MG/3ML) 0.083% IN NEBU: 3.0000 mL | INHALATION_SOLUTION | Freq: Four times a day (QID) | RESPIRATORY_TRACT | Status: DC | PRN

## 2016-11-07 MED ORDER — FOLIC ACID 1 MG PO TABS
1.0000 mg | ORAL_TABLET | Freq: Every day | ORAL | Status: DC
  Administered 2016-11-07 – 2016-11-11 (×5): 1 mg via ORAL
  Filled 2016-11-07 (×5): qty 1

## 2016-11-07 MED ORDER — CARVEDILOL 12.5 MG PO TABS
25.0000 mg | ORAL_TABLET | Freq: Two times a day (BID) | ORAL | Status: DC
  Administered 2016-11-07 – 2016-11-11 (×7): 25 mg via ORAL
  Filled 2016-11-07 (×7): qty 2

## 2016-11-07 MED ORDER — ACETAMINOPHEN 325 MG PO TABS: 650.0000 mg | ORAL_TABLET | ORAL | Status: DC | PRN

## 2016-11-07 MED ORDER — HEPARIN SODIUM (PORCINE) 5000 UNIT/ML IJ SOLN: 5000.0000 [IU] | Freq: Three times a day (TID) | INTRAMUSCULAR | Status: DC

## 2016-11-07 MED ORDER — SODIUM CHLORIDE 0.9 % IV SOLN: 250.0000 mL | INTRAVENOUS | Status: DC | PRN

## 2016-11-07 MED ORDER — ONDANSETRON HCL 4 MG/2ML IJ SOLN: 4.0000 mg | Freq: Four times a day (QID) | INTRAMUSCULAR | Status: DC | PRN

## 2016-11-07 MED ORDER — MAGNESIUM OXIDE 400 (241.3 MG) MG PO TABS
400.0000 mg | ORAL_TABLET | Freq: Every day | ORAL | Status: DC
  Administered 2016-11-07 – 2016-11-11 (×5): 400 mg via ORAL
  Filled 2016-11-07 (×5): qty 1

## 2016-11-07 MED ORDER — APIXABAN 5 MG PO TABS
5.0000 mg | ORAL_TABLET | Freq: Two times a day (BID) | ORAL | Status: DC
  Administered 2016-11-07 – 2016-11-11 (×8): 5 mg via ORAL
  Filled 2016-11-07 (×9): qty 1

## 2016-11-07 MED ORDER — LOSARTAN POTASSIUM 25 MG PO TABS
25.0000 mg | ORAL_TABLET | Freq: Every day | ORAL | Status: DC
  Administered 2016-11-07 – 2016-11-11 (×5): 25 mg via ORAL
  Filled 2016-11-07 (×5): qty 1

## 2016-11-07 NOTE — ED Notes (Signed)
[employer] pacemaker interrogated by this RN.  

## 2016-11-07 NOTE — ED Triage Notes (Signed)
Pt. Stated, I've had SOB for about a week and today my defibrillator is firing. Denies any pain.

## 2016-11-07 NOTE — ED Provider Notes (Signed)
[facility] EMERGENCY DEPARTMENT Provider Note   CSN: [id number] Arrival date & time: 11/07/16  0940     History   Chief Complaint Chief Complaint  Patient presents with  . Shortness of Breath  . AICD Problem    "its been firing"    HPI Shelley Douglas is a 66 y.o. female.  The history is provided by the patient and medical records. No language interpreter was used.  Shortness of Breath  This is a new problem. The average episode lasts 1 week. The problem occurs continuously.The current episode started more than 2 days ago. The problem has been gradually worsening. Pertinent negatives include no fever, no coryza, no rhinorrhea, no neck pain, no cough, no sputum production, no hemoptysis, no wheezing, no chest pain, no syncope, no vomiting, no abdominal pain, no rash, no leg pain and no leg swelling. She has tried nothing for the symptoms. The treatment provided no relief. She has had prior hospitalizations. Associated medical issues include heart failure.    Past Medical History:  Diagnosis Date  . AICD (automatic cardioverter/defibrillator) present   . Anxiety    pt denies this hx on 05/12/2016  . CAD (coronary artery disease)    last cath 2016  . Daily headache   . DVT (deep venous thrombosis) ([place]) 1996  . High cholesterol   . History of blood transfusion    "related to pregnancy" (05/12/2016)  . Hypertension    "just started taking RX" (05/12/2016)  . Ischemic cardiomyopathy   . Left bundle branch block   . Mitral valve regurgitation   . Myocardial infarction [facility]) ~ 2009 X2  . Obesity   . Persistent atrial fibrillation ([place])   . Pulmonary embolism ([place]) 1993  . Seizures ([place]) 1970s   "I had 1; don't know what it was from" (05/12/2016)  . Stroke [facility]) 2001   denies residual on 05/12/2016  . Type II diabetes mellitus [facility])     Patient Active Problem List   Diagnosis Date Noted  . CHF (congestive heart failure) ([place]) 05/13/2016  . Acute on chronic  left systolic heart failure ([place]) 05/12/2016  . Chronic systolic dysfunction of left ventricle 07/01/2015  . ACS (acute coronary syndrome) ([place]) 12/07/2014  . Acute left systolic heart failure ([place]) 09/27/2014  . Dizziness 09/24/2014  . Routine gynecological examination 02/07/2012  . Ischemic cardiomyopathy 08/07/2010  . 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 PULMONARY EMBOLISM 01/29/2010  . VENTRICULAR TACHYCARDIA 01/29/2010  . Atrial fibrillation ([place]) 01/29/2010  . Chronic systolic congestive heart failure ([place]) 01/29/2010  . Cerebral artery occlusion with cerebral infarction ([place]) 01/29/2010  . IMPLANTATION OF DEFIBRILLATOR, HX OF 01/29/2010    Past Surgical History:  Procedure Laterality Date  . BILATERAL CARPAL TUNNEL RELEASE Bilateral 1990s  . CARDIAC DEFIBRILLATOR PLACEMENT  07/24/2007   Medtronic [address] DR implanted in [place] for primary prevention  . CORONARY ANGIOPLASTY WITH STENT PLACEMENT  ~ 2009  . CORONARY ARTERY BYPASS GRAFT  ~ 2009  . DILATION AND CURETTAGE OF UTERUS    . EXTERNAL EAR SURGERY Right 1990   "skin grafted from my right thigh and put on my right ear"  . HERNIA REPAIR  1990's    "stomach"  . TUBAL LIGATION    . VENA CAVA FILTER PLACEMENT  1993    OB History    Gravida Para Term Preterm AB Living   4 4 4  0 0 4   SAB TAB Ectopic Multiple  Live Births   0 0 0 0         Home Medications    Prior to Admission medications   Medication Sig Start Date End Date Taking? Authorizing Provider  amiodarone (PACERONE) 200 MG tablet Take 1 tablet (200 mg total) by mouth daily. 01/04/15   Dixie Dials, MD  apixaban (ELIQUIS) 5 MG TABS tablet Take 1 tablet (5 mg total) by mouth 2 (two) times daily. 05/14/16   Dixie Dials, MD  carvedilol (COREG) 25 MG tablet Take 1 tablet (25 mg total) by mouth 2 (two) times daily with a meal. 05/14/16   Dixie Dials, MD  digoxin (LANOXIN) 0.125 MG tablet Take 125 mcg by mouth daily.      [provider]  folic acid (FOLVITE) 1 MG tablet Take 1 mg by mouth daily.     [provider]  furosemide (LASIX) 40 MG tablet Take 1 tablet (40 mg total) by mouth every Monday, Wednesday, and Friday. 05/14/16   Dixie Dials, MD  losartan (COZAAR) 25 MG tablet Take 25 mg by mouth daily. 04/27/16   [provider]  magnesium oxide (MAG-OX) 400 (241.3 Mg) MG tablet Take 1 tablet (400 mg total) by mouth daily. 05/15/16   Dixie Dials, MD  meclizine (ANTIVERT) 25 MG tablet Take 25 mg by mouth daily as needed for dizziness. 04/27/16   [provider]  nitroGLYCERIN (NITROSTAT) 0.4 MG SL tablet Place 1 tablet (0.4 mg total) under the tongue every 5 (five) minutes x 3 doses as needed for chest pain. Patient not taking: Reported on 05/12/2016 06/20/15   Thompson Grayer, MD    Family History Family History  Problem Relation Age of Onset  . Hypertension Unknown     Social History Social History   Tobacco Use  . Smoking status: Former Smoker    Years: 4.00    Types: Cigarettes    Last attempt to quit: 01/05/1988    Years since quitting: 28.8  . Smokeless tobacco: Never Used  . Tobacco comment: 05/12/2016 "someday smoker when I did smoked; never smoked much"  Substance Use Topics  . Alcohol use: No  . Drug use: No     Allergies   Clopidogrel bisulfate   Review of Systems Review of Systems  Constitutional: Positive for fatigue. Negative for chills, diaphoresis and fever.  HENT: Negative for congestion and rhinorrhea.   Eyes: Negative for visual disturbance.  Respiratory: Positive for chest tightness and shortness of breath. Negative for cough, hemoptysis, sputum production, choking and wheezing.   Cardiovascular: Negative for chest pain, palpitations, leg swelling and syncope.  Gastrointestinal: Negative for abdominal pain, constipation, diarrhea, nausea and vomiting.  Genitourinary: Negative for dysuria and flank pain.  Musculoskeletal: Negative for back  pain, neck pain and neck stiffness.  Skin: Negative for rash and wound.  Neurological: Negative for facial asymmetry, light-headedness and numbness.  Psychiatric/Behavioral: Negative for agitation and confusion.  All other systems reviewed and are negative.    Physical Exam Updated Vital Signs BP (!) 160/85 (BP Location: Right Arm)   Pulse 96   Temp 97.8 F (36.6 C) (Oral)   Resp 16   Ht 5\' 7"  (1.702 m)   Wt 103.1 kg (227 lb 4.7 oz)   SpO2 99%   BMI 35.60 kg/m   Physical Exam  Constitutional: She is oriented to person, place, and time. She appears well-developed and well-nourished.  Non-toxic appearance. She does not appear ill. No distress.  HENT:  Head: Normocephalic and atraumatic.  Right Ear: External ear normal.  Left Ear: External ear normal.  Nose: Nose normal.  Mouth/Throat: Oropharynx is clear and moist. No oropharyngeal exudate.  Eyes: Conjunctivae and EOM are normal. Pupils are equal, round, and reactive to light.  Neck: Normal range of motion. Neck supple.  Cardiovascular: Normal rate and intact distal pulses.  Pulmonary/Chest: Effort normal. No stridor. No respiratory distress. She has no wheezes. She has no rhonchi. She has rales. She exhibits no tenderness.  Abdominal: She exhibits no distension. There is no tenderness. There is no rebound.  Musculoskeletal: She exhibits no tenderness.  Neurological: She is alert and oriented to person, place, and time. She has normal reflexes. No sensory deficit. She exhibits normal muscle tone. Coordination normal.  Skin: Skin is warm. Capillary refill takes less than 2 seconds. No rash noted. She is not diaphoretic. No erythema.  Psychiatric: She has a normal mood and affect.  Nursing note and vitals reviewed.    ED Treatments / Results  Labs (all labs ordered are listed, but only abnormal results are displayed) Labs Reviewed  BASIC METABOLIC PANEL - Abnormal; Notable for the following components:      Result Value    Glucose, Bld 193 (*)    Creatinine, Ser 1.15 (*)    GFR calc non Af Amer 48 (*)    GFR calc Af Amer 56 (*)    All other components within normal limits  CBC - Abnormal; Notable for the following components:   Hemoglobin 11.2 (*)    HCT 35.8 (*)    All other components within normal limits  BRAIN NATRIURETIC PEPTIDE - Abnormal; Notable for the following components:   B Natriuretic Peptide 920.3 (*)    All other components within normal limits  HEPATIC FUNCTION PANEL - Abnormal; Notable for the following components:   Albumin 3.3 (*)    Bilirubin, Direct <0.1 (*)    All other components within normal limits  DIGOXIN LEVEL - Abnormal; Notable for the following components:   Digoxin Level <0.2 (*)    All other components within normal limits  LIPASE, BLOOD  TSH  TROPONIN I  MAGNESIUM  TROPONIN I  TROPONIN I  BASIC METABOLIC PANEL  I-STAT TROPONIN, ED    EKG  EKG Interpretation  Date/Time:  Sunday November 07 2016 09:49:51 EST Ventricular Rate:  105 PR Interval:  158 QRS Duration: 92 QT Interval:  352 QTC Calculation: 465 R Axis:   -79 Text Interpretation:  Possible Left atrial enlargement Left axis deviation Inferior infarct , age undetermined Anterolateral infarct , age undetermined Abnormal ECG paced rhythm No STEMI Confirmed by Antony Blackbird [phone]) on 11/07/2016 10:34:40 AM       Radiology Dg Chest 2 View  Result Date: 11/07/2016 CLINICAL DATA:  66 year old female with shortness of breath, cough, and pacemaker discharge this morning. EXAM: CHEST  2 VIEW COMPARISON:  05/12/2016 and earlier. FINDINGS: Stable left chest 3 lead cardiac AICD. Stable cardiomegaly and mediastinal contours. Superimposed large lung volumes. Small bilateral pleural effusions are more apparent than in May. Pulmonary vascularity appears stable without overt edema. No pneumothorax. No consolidation. Occasional mild thoracic superior endplate compression. Stable visualized osseous structures.  Partially visible IVC filter. IMPRESSION: 1. Chronic cardiomegaly. Small bilateral pleural effusions appear increased since May. 2. No other acute cardiopulmonary abnormality. Electronically Signed   By: Genevie Ann M.D.   On: 11/07/2016 11:17    Procedures Procedures (including critical care time)  Medications Ordered in ED Medications  sodium chloride flush (NS)  0.9 % injection 3 mL (3 mLs Intravenous Given 11/07/16 1628)  sodium chloride flush (NS) 0.9 % injection 3 mL (not administered)  0.9 %  sodium chloride infusion (not administered)  furosemide (LASIX) injection 40 mg (40 mg Intravenous Given 11/07/16 1627)  acetaminophen (TYLENOL) tablet 650 mg (not administered)  ondansetron (ZOFRAN) injection 4 mg (not administered)  apixaban (ELIQUIS) tablet 5 mg (5 mg Oral Not Given 11/07/16 1630)  amiodarone (PACERONE) tablet 200 mg (200 mg Oral Given 11/07/16 1624)  aspirin EC tablet 81 mg (81 mg Oral Given 11/07/16 1625)  carvedilol (COREG) tablet 25 mg (25 mg Oral Given 82/4/23 5361)  folic acid (FOLVITE) tablet 1 mg (1 mg Oral Given 11/07/16 1625)  losartan (COZAAR) tablet 25 mg (25 mg Oral Given 11/07/16 1626)  magnesium oxide (MAG-OX) tablet 400 mg (400 mg Oral Given 11/07/16 1625)  albuterol (PROVENTIL) (2.5 MG/3ML) 0.083% nebulizer solution 3 mL (not administered)     Initial Impression / Assessment and Plan / ED Course  I have reviewed the triage vital signs and the nursing notes.  Pertinent labs & imaging results that were available during my care of the patient were reviewed by me and considered in my medical decision making (see chart for details).     Shelley Douglas is a 66 y.o. 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   Patient says that she has had fatigue and exertional shortness of breath worsening for the last week.  She says that she is unsure of the cause.  She denies recent fevers or chills.  She reports a dry cough but otherwise has not had any evidence of hemoptysis.  She says her legs have not been swollen and she has had blood clot problems in the past.  She does report having some diaphoresis with her shortness of breath.  She says that this morning she was having an episode of the shortness of breath when her ICD fired.  She denies any other episodes of it firing.  She denies nausea, vomiting, abdominal pain, or chest pain.  She denies any changes in medications recently.  She denies any other complaints.  On exam, patient has minimal lower extremity edema.  Lungs had some crackles in the bases but otherwise had no wheezing or rhonchi.  Chest was nontender.  Abdomen nontender.  Back and CVA areas nontender.  Patient has symmetric pulses in upper and lower extremities.  Next  Initial EKG showed a paced rhythm.  No STEMI.  Patient will have laboratory testing and x-ray to look for etiology of her symptoms.  With the crackles on the lungs, suspect component of fluid overload.  Dig level will be collected.  Next  Anticipate reassessment after diagnostic workup.  Patient's defibrillator/pacemaker will be interrogated.  12:50 PM Defibrillator interrogation revealed that patient received a defibrillation shock this morning when she was in A. fib with RVR and a rate of 231.  Patient's BNP elevated at 920.  This is elevated from previously in the 200s several months ago.  Metabolic panel reassuring and CBC showed mild anemia with no leuk.  Initial troponin negative.  Hepatic function panel reassuring.  Chest x-ray revealed slightly worsened pleural effusions with cardiomegaly.    Given patient's crackles in the  lungs and elevated BNP, suspect fluid overload.  Given her A. fib with RVR status post  defibrillation, cardiology will be called.    Anticipate admission for further management. Cardiology called and they will admit.    Final Clinical Impressions(s) / ED Diagnoses   Final diagnoses:  AICD discharge  Shortness of breath  Hypervolemia, unspecified hypervolemia type    Clinical Impression: 1. AICD discharge   2. Shortness of breath   3. Hypervolemia, unspecified hypervolemia type     Disposition: Admit to Cardiology    Kaeley Vinje, Gwenyth Allegra, MD 11/07/16 [phone]

## 2016-11-07 NOTE — H&P (Signed)
Referring Physician:  [name] is an 66 y.o. female.                       Chief Complaint: Shortness of breath  HPI: 66 year old female with PMH of CAD, DM typr II, Stroke, Chronic left heart systolic failure, atrial fibrillation, Obesity, pulmonary embolism and DVT has exertional shortness of breath for 1 week but this morning her ICD fired. Her chest x-ray shows cardiomegaly with small bilateral pleural effusions. Her LV EF was down to 30-35 % 6 months ago. BNP today is elevated 920.3. Electrolytes are normal.  Past Medical History:  Diagnosis Date  . AICD (automatic cardioverter/defibrillator) present   . Anxiety    pt denies this hx on 05/12/2016  . CAD (coronary artery disease)    last cath 2016  . Daily headache   . DVT (deep venous thrombosis) ([place]) 1996  . High cholesterol   . History of blood transfusion    "related to pregnancy" (05/12/2016)  . Hypertension    "just started taking RX" (05/12/2016)  . Ischemic cardiomyopathy   . Left bundle branch block   . Mitral valve regurgitation   . Myocardial infarction [facility]) ~ 2009 X2  . Obesity   . Persistent atrial fibrillation ([place])   . Pulmonary embolism ([place]) 1993  . Seizures ([place]) 1970s   "I had 1; don't know what it was from" (05/12/2016)  . Stroke [facility]) 2001   denies residual on 05/12/2016  . Type II diabetes mellitus ([place])       Past Surgical History:  Procedure Laterality Date  . BILATERAL CARPAL TUNNEL RELEASE Bilateral 1990s  . CARDIAC DEFIBRILLATOR PLACEMENT  07/24/2007   [employer] [address] DR implanted in [place] for primary prevention  . CORONARY ANGIOPLASTY WITH STENT PLACEMENT  ~ 2009  . CORONARY ARTERY BYPASS GRAFT  ~ 2009  . DILATION AND CURETTAGE OF UTERUS    . EXTERNAL EAR SURGERY Right 1990   "skin grafted from my right thigh and put on my right ear"  . HERNIA REPAIR  1990's    "stomach"  . TUBAL LIGATION    . VENA CAVA FILTER PLACEMENT  1993    Family History  Problem Relation Age of Onset   . Hypertension Unknown    Social History:  reports that she quit smoking about 28 years ago. Her smoking use included cigarettes. She quit after 4.00 years of use. she has never used smokeless tobacco. She reports that she does not drink alcohol or use drugs.  Allergies:  Allergies  Allergen Reactions  . Clopidogrel Bisulfate Rash     (Not in a hospital admission)  Results for orders placed or performed during the hospital encounter of 11/07/16 (from the past 48 hour(s))  Basic metabolic panel     Status: Abnormal   Collection Time: 11/07/16 10:19 AM  Result Value Ref Range   Sodium 138 135 - 145 mmol/L   Potassium 4.5 3.5 - 5.1 mmol/L   Chloride 106 101 - 111 mmol/L   CO2 23 22 - 32 mmol/L   Glucose, Bld 193 (H) 65 - 99 mg/dL   BUN 15 6 - 20 mg/dL   Creatinine, Ser 1.15 (H) 0.44 - 1.00 mg/dL   Calcium 10.2 8.9 - 10.3 mg/dL   GFR calc non Af Amer 48 (L) >60 mL/min   GFR calc Af Amer 56 (L) >60 mL/min    Comment: (NOTE) The eGFR has been calculated using the  CKD EPI equation. This calculation has not been validated in all clinical situations. eGFR's persistently <60 mL/min signify possible Chronic Kidney Disease.    Anion gap 9 5 - 15  CBC     Status: Abnormal   Collection Time: 11/07/16 10:19 AM  Result Value Ref Range   WBC 7.9 4.0 - 10.5 K/uL   RBC 4.01 3.87 - 5.11 MIL/uL   Hemoglobin 11.2 (L) 12.0 - 15.0 g/dL   HCT 35.8 (L) 36.0 - 46.0 %   MCV 89.3 78.0 - 100.0 fL   MCH 27.9 26.0 - 34.0 pg   MCHC 31.3 30.0 - 36.0 g/dL   RDW 14.8 11.5 - 15.5 %   Platelets 299 150 - 400 K/uL  Brain natriuretic peptide     Status: Abnormal   Collection Time: 11/07/16 10:19 AM  Result Value Ref Range   B Natriuretic Peptide 920.3 (H) 0.0 - 100.0 pg/mL  Hepatic function panel     Status: Abnormal   Collection Time: 11/07/16 10:19 AM  Result Value Ref Range   Total Protein 7.8 6.5 - 8.1 g/dL   Albumin 3.3 (L) 3.5 - 5.0 g/dL   AST 22 15 - 41 U/L   ALT 15 14 - 54 U/L   Alkaline  Phosphatase 97 38 - 126 U/L   Total Bilirubin 0.6 0.3 - 1.2 mg/dL   Bilirubin, Direct <0.1 (L) 0.1 - 0.5 mg/dL   Indirect Bilirubin NOT CALCULATED 0.3 - 0.9 mg/dL  Lipase, blood     Status: None   Collection Time: 11/07/16 10:19 AM  Result Value Ref Range   Lipase 20 11 - 51 U/L  I-stat troponin, ED     Status: None   Collection Time: 11/07/16 10:27 AM  Result Value Ref Range   Troponin i, poc 0.01 0.00 - 0.08 ng/mL   Comment 3            Comment: Due to the release kinetics of cTnI, a negative result within the first hours of the onset of symptoms does not rule out myocardial infarction with certainty. If myocardial infarction is still suspected, repeat the test at appropriate intervals.   TSH     Status: None   Collection Time: 11/07/16 10:50 AM  Result Value Ref Range   TSH 0.515 0.350 - 4.500 uIU/mL    Comment: Performed by a 3rd Generation assay with a functional sensitivity of <=0.01 uIU/mL.  Digoxin level     Status: Abnormal   Collection Time: 11/07/16 10:50 AM  Result Value Ref Range   Digoxin Level <0.2 (L) 0.8 - 2.0 ng/mL    Comment: RESULTS CONFIRMED BY MANUAL DILUTION   Dg Chest 2 View  Result Date: 11/07/2016 CLINICAL DATA:  66 year old female with shortness of breath, cough, and pacemaker discharge this morning. EXAM: CHEST  2 VIEW COMPARISON:  05/12/2016 and earlier. FINDINGS: Stable left chest 3 lead cardiac AICD. Stable cardiomegaly and mediastinal contours. Superimposed large lung volumes. Small bilateral pleural effusions are more apparent than in May. Pulmonary vascularity appears stable without overt edema. No pneumothorax. No consolidation. Occasional mild thoracic superior endplate compression. Stable visualized osseous structures. Partially visible IVC filter. IMPRESSION: 1. Chronic cardiomegaly. Small bilateral pleural effusions appear increased since May. 2. No other acute cardiopulmonary abnormality. Electronically Signed   By: Genevie Ann M.D.   On:  11/07/2016 11:17    Review Of Systems Constitutional: No fever, chills, weight loss or gain. Eyes: No vision change, wears glasses. No discharge or pain. Ears:  No hearing loss, No tinnitus. Respiratory: No asthma, COPD, pneumonias. No shortness of breath. No hemoptysis. Cardiovascular: Positive chest pain, palpitation, leg edema. Gastrointestinal: No nausea, vomiting, diarrhea, constipation. No GI bleed. No hepatitis. Genitourinary: No dysuria, hematuria, kidney stone. No incontinance. Neurological: No headache, stroke, seizures.  Psychiatry: No psych facility admission for anxiety, depression, suicide. No detox. Skin: No rash. Musculoskeletal: No joint pain, fibromyalgia. No neck pain, back pain. Lymphadenopathy: No lymphadenopathy. Hematology: No anemia or easy bruising.   Blood pressure (!) 148/93, pulse 93, resp. rate (!) 21, SpO2 99 %. There is no height or weight on file to calculate BMI. General appearance: alert, cooperative, appears stated age and no distress Head: Normocephalic, atraumatic. Eyes: Brown eyes, pink conjunctiva, corneas clear. PERRL, EOM's intact. Neck: No adenopathy, no carotid bruit, no JVD, supple, symmetrical, trachea midline and thyroid not enlarged. Resp: Crackles at bases to auscultation bilaterally. Cardio: Regular rate and rhythm, S1, S2 normal, II/VI systolic murmur, no click, rub or gallop GI: Soft, non-tender; bowel sounds normal; no organomegaly. Extremities: Trace edema, no cyanosis or clubbing. Skin: Warm and dry.  Neurologic: Alert and oriented X 3, normal strength. Normal coordination.  Assessment/Plan Acute on chronic left heart systolic failure Paroxysmal atrial fibrillation, CHA2DS2VASc score of 6 Paroxysmal sustained VT ICD firing CAD S/P RCA stent DM, II H/O DVT and PE Biventricular pacemaker/ICD  Admit. IV lasix. R/O MI Home medications.  Birdie Riddle, MD  11/07/2016, 1:58 PM

## 2016-11-07 NOTE — ED Notes (Signed)
Patient transported to X-ray 

## 2016-11-08 LAB — BASIC METABOLIC PANEL
Anion gap: 6 (ref 5–15)
BUN: 17 mg/dL (ref 6–20)
CALCIUM: 9.6 mg/dL (ref 8.9–10.3)
CHLORIDE: 103 mmol/L (ref 101–111)
CO2: 27 mmol/L (ref 22–32)
Creatinine, Ser: 1.11 mg/dL — ABNORMAL HIGH (ref 0.44–1.00)
GFR calc non Af Amer: 51 mL/min — ABNORMAL LOW (ref >60)
GFR, EST AFRICAN AMERICAN: 59 mL/min — AB (ref >60)
Glucose, Bld: 165 mg/dL — ABNORMAL HIGH (ref 65–99)
Potassium: 4 mmol/L (ref 3.5–5.1)
SODIUM: 136 mmol/L (ref 135–145)

## 2016-11-08 LAB — TROPONIN I: Troponin I: <0.03 ng/mL (ref <0.03)

## 2016-11-08 NOTE — Plan of Care (Signed)
Continue IV diuretics, strict I/O, daily basic metabolic panels, daily weights, and cardiac monitoring

## 2016-11-08 NOTE — Progress Notes (Signed)
Ref: Elwyn Reach, MD   Subjective:  Shortness of breath improving. Questionable good diuresis as patient used bathroom on her own without recording urine output at times. Afebrile.  Objective:  Vital Signs in the last 24 hours: Temp:  [97.8 F (36.6 C)-98.5 F (36.9 C)] 98.5 F (36.9 C) (11/05 0528) Pulse Rate:  [79-96] 79 (11/05 0528) Cardiac Rhythm: Normal sinus rhythm;Ventricular paced (11/05 0842) Resp:  [16-27] 16 (11/05 0528) BP: (115-160)/(62-93) 115/67 (11/05 0528) SpO2:  [97 %-100 %] 100 % (11/05 0528) Weight:  [102.6 kg (226 lb 3.2 oz)-103.1 kg (227 lb 4.7 oz)] 102.6 kg (226 lb 3.2 oz) (11/05 0530)  Physical Exam: BP Readings from Last 1 Encounters:  11/08/16 115/67     Wt Readings from Last 1 Encounters:  11/08/16 102.6 kg (226 lb 3.2 oz)    Weight change:  Body mass index is 35.43 kg/m. HEENT: [place]/AT, Eyes-Brown, PERL, EOMI, Conjunctiva-Pink, Sclera-Non-icteric Neck: No JVD, No bruit, Trachea midline. Lungs:  Clearing, Bilateral. Cardiac:  Regular rhythm, normal S1 and S2, no S3. II/VI systolic murmur. Abdomen:  Soft, non-tender. BS present. Extremities:  Trace edema present. No cyanosis. No clubbing. CNS: AxOx3, Cranial nerves grossly intact, moves all 4 extremities.  Skin: Warm and dry.   Intake/Output from previous day: 11/04 0701 - 11/05 0700 In: 6 [P.O.:600; I.V.:3] Out: -     Lab Results: BMET    Component Value Date/Time   NA 136 11/08/2016 0203   NA 138 11/07/2016 1019   NA 135 05/14/2016 0536   K 4.0 11/08/2016 0203   K 4.5 11/07/2016 1019   K 4.7 05/14/2016 0536   CL 103 11/08/2016 0203   CL 106 11/07/2016 1019   CL 104 05/14/2016 0536   CO2 27 11/08/2016 0203   CO2 23 11/07/2016 1019   CO2 23 05/14/2016 0536   GLUCOSE 165 (H) 11/08/2016 0203   GLUCOSE 193 (H) 11/07/2016 1019   GLUCOSE 131 (H) 05/14/2016 0536   BUN 17 11/08/2016 0203   BUN 15 11/07/2016 1019   BUN 17 05/14/2016 0536   CREATININE 1.11 (H) 11/08/2016 0203    CREATININE 1.15 (H) 11/07/2016 1019   CREATININE 1.23 (H) 05/14/2016 0536   CREATININE 0.93 06/20/2015 1021   CALCIUM 9.6 11/08/2016 0203   CALCIUM 10.2 11/07/2016 1019   CALCIUM 10.2 05/14/2016 0536   GFRNONAA 51 (L) 11/08/2016 0203   GFRNONAA 48 (L) 11/07/2016 1019   GFRNONAA 45 (L) 05/14/2016 0536   GFRAA 59 (L) 11/08/2016 0203   GFRAA 56 (L) 11/07/2016 1019   GFRAA 52 (L) 05/14/2016 0536   CBC    Component Value Date/Time   WBC 7.9 11/07/2016 1019   RBC 4.01 11/07/2016 1019   HGB 11.2 (L) 11/07/2016 1019   HCT 35.8 (L) 11/07/2016 1019   PLT 299 11/07/2016 1019   MCV 89.3 11/07/2016 1019   MCH 27.9 11/07/2016 1019   MCHC 31.3 11/07/2016 1019   RDW 14.8 11/07/2016 1019   LYMPHSABS 1.7 05/12/2016 1725   MONOABS 0.5 05/12/2016 1725   EOSABS 0.2 05/12/2016 1725   BASOSABS 0.0 05/12/2016 1725   HEPATIC Function Panel Recent Labs    05/12/16 1725 11/07/16 1019  PROT 7.5 7.8   HEMOGLOBIN A1C No components found for: HGA1C,  MPG CARDIAC ENZYMES Lab Results  Component Value Date   TROPONINI <0.03 11/08/2016   TROPONINI <0.03 11/07/2016   TROPONINI <0.03 11/07/2016   BNP No results for input(s): PROBNP in the last 8760 hours. TSH Recent Labs  11/07/16 1050  TSH 0.515   CHOLESTEROL Recent Labs    05/14/16 1000  CHOL 241*    Scheduled Meds: . amiodarone  200 mg Oral Daily  . apixaban  5 mg Oral BID  . aspirin EC  81 mg Oral Daily  . carvedilol  25 mg Oral BID WC  . folic acid  1 mg Oral Daily  . furosemide  40 mg Intravenous Q12H  . losartan  25 mg Oral Daily  . magnesium oxide  400 mg Oral Daily  . sodium chloride flush  3 mL Intravenous Q12H   Continuous Infusions: . sodium chloride     PRN Meds:.sodium chloride, acetaminophen, albuterol, ondansetron (ZOFRAN) IV, sodium chloride flush  Assessment/Plan: Acute on chronic left heart systolic failure Paroxysmal atrial fibrillation Paroxysmal sustained ventricular tachycardia ICD  firing CAD Status post RCA stent Type 2 diabetes mellitus History of DVT and PE Biventricular pacemaker/ICD  Continue diuresis. Increase activity.   LOS: 1 day    Dixie Dials  MD  11/08/2016, 10:29 AM

## 2016-11-08 NOTE — Progress Notes (Signed)
   11/08/16 1500  Clinical Encounter Type  Visited With Patient  Visit Type Follow-up  Referral From Nurse  Consult/Referral To Chaplain  Spiritual Encounters  Spiritual Needs Prayer;Emotional  Stress Factors  Patient Stress Factors Health changes  Chaplain visited with PT to complete Advanced Directive.  The PT has been given the info and will read through it and decide on what aspects she wants to complete if any.  We discussed her faith background and belief systems.  The PT believes that she has a purpose in life that has not yet been fulfilled.

## 2016-11-08 NOTE — Plan of Care (Signed)
Pt compliant w/ fluid restriction this hospital stay. Pt adequately diuresing with balanced I&O's. Pt denies SOB, able to ambulate around the room and the [facility] without getting SOB. Continue diuresing and increasing activity per MD note.

## 2016-11-09 LAB — BASIC METABOLIC PANEL
Anion gap: 8 (ref 5–15)
BUN: 20 mg/dL (ref 6–20)
CO2: 26 mmol/L (ref 22–32)
CREATININE: 1.2 mg/dL — AB (ref 0.44–1.00)
Calcium: 9.6 mg/dL (ref 8.9–10.3)
Chloride: 101 mmol/L (ref 101–111)
GFR calc Af Amer: 53 mL/min — ABNORMAL LOW (ref >60)
GFR calc non Af Amer: 46 mL/min — ABNORMAL LOW (ref >60)
GLUCOSE: 155 mg/dL — AB (ref 65–99)
Potassium: 3.9 mmol/L (ref 3.5–5.1)
Sodium: 135 mmol/L (ref 135–145)

## 2016-11-09 NOTE — Plan of Care (Signed)
Pt ambulating without SOB in room, denies chest discomfort or weakness, reports she limits fluids and sodium intake, lower extremity edema resolved.  Edward Qualia RN

## 2016-11-09 NOTE — Progress Notes (Signed)
Ref: Elwyn Reach, MD   Subjective:  Exertional dyspnea continues but breathing better at rest. Afebrile. BUN and Creatinine trending up.  Patient has h/o of ischemic cardiomyopathy. She needs cardiac catheterization but wants to undergo nuclear stress test first to find out how much myocardium is at risk.   Objective:  Vital Signs in the last 24 hours: Temp:  [98.4 F (36.9 C)-98.7 F (37.1 C)] 98.4 F (36.9 C) (11/06 2109) Pulse Rate:  [70-73] 73 (11/06 2109) Cardiac Rhythm: Ventricular paced (11/06 2140) Resp:  [18-20] 18 (11/06 2109) BP: (94-118)/(47-63) 109/61 (11/06 2109) SpO2:  [96 %-97 %] 96 % (11/06 2109) Weight:  [102.5 kg (225 lb 14.4 oz)] 102.5 kg (225 lb 14.4 oz) (11/06 0538)  Physical Exam: BP Readings from Last 1 Encounters:  11/09/16 109/61     Wt Readings from Last 1 Encounters:  11/09/16 102.5 kg (225 lb 14.4 oz)    Weight change: -0.632 kg (-6.3 oz) Body mass index is 35.38 kg/m. HEENT: [place]/AT, Eyes-Brown, PERL, EOMI, Conjunctiva-Pink, Sclera-Non-icteric Neck: No JVD, No bruit, Trachea midline. Lungs:  Clear, Bilateral. Cardiac:  Regular rhythm, normal S1 and S2, no S3. II/VI systolic murmur. Abdomen:  Soft, non-tender. BS present. Extremities:  1 + edema present. No cyanosis. No clubbing. CNS: AxOx3, Cranial nerves grossly intact, moves all 4 extremities.  Skin: Warm and dry.   Intake/Output from previous day: 11/05 0701 - 11/06 0700 In: 240 [P.O.:240] Out: 2100 [Urine:2100]    Lab Results: BMET    Component Value Date/Time   NA 135 11/09/2016 0341   NA 136 11/08/2016 0203   NA 138 11/07/2016 1019   K 3.9 11/09/2016 0341   K 4.0 11/08/2016 0203   K 4.5 11/07/2016 1019   CL 101 11/09/2016 0341   CL 103 11/08/2016 0203   CL 106 11/07/2016 1019   CO2 26 11/09/2016 0341   CO2 27 11/08/2016 0203   CO2 23 11/07/2016 1019   GLUCOSE 155 (H) 11/09/2016 0341   GLUCOSE 165 (H) 11/08/2016 0203   GLUCOSE 193 (H) 11/07/2016 1019   BUN 20  11/09/2016 0341   BUN 17 11/08/2016 0203   BUN 15 11/07/2016 1019   CREATININE 1.20 (H) 11/09/2016 0341   CREATININE 1.11 (H) 11/08/2016 0203   CREATININE 1.15 (H) 11/07/2016 1019   CREATININE 0.93 06/20/2015 1021   CALCIUM 9.6 11/09/2016 0341   CALCIUM 9.6 11/08/2016 0203   CALCIUM 10.2 11/07/2016 1019   GFRNONAA 46 (L) 11/09/2016 0341   GFRNONAA 51 (L) 11/08/2016 0203   GFRNONAA 48 (L) 11/07/2016 1019   GFRAA 53 (L) 11/09/2016 0341   GFRAA 59 (L) 11/08/2016 0203   GFRAA 56 (L) 11/07/2016 1019   CBC    Component Value Date/Time   WBC 7.9 11/07/2016 1019   RBC 4.01 11/07/2016 1019   HGB 11.2 (L) 11/07/2016 1019   HCT 35.8 (L) 11/07/2016 1019   PLT 299 11/07/2016 1019   MCV 89.3 11/07/2016 1019   MCH 27.9 11/07/2016 1019   MCHC 31.3 11/07/2016 1019   RDW 14.8 11/07/2016 1019   LYMPHSABS 1.7 05/12/2016 1725   MONOABS 0.5 05/12/2016 1725   EOSABS 0.2 05/12/2016 1725   BASOSABS 0.0 05/12/2016 1725   HEPATIC Function Panel Recent Labs    05/12/16 1725 11/07/16 1019  PROT 7.5 7.8   HEMOGLOBIN A1C No components found for: HGA1C,  MPG CARDIAC ENZYMES Lab Results  Component Value Date   TROPONINI <0.03 11/08/2016   TROPONINI <0.03 11/07/2016   TROPONINI <  0.03 11/07/2016   BNP No results for input(s): PROBNP in the last 8760 hours. TSH Recent Labs    11/07/16 1050  TSH 0.515   CHOLESTEROL Recent Labs    05/14/16 1000  CHOL 241*    Scheduled Meds: . amiodarone  200 mg Oral Daily  . apixaban  5 mg Oral BID  . aspirin EC  81 mg Oral Daily  . carvedilol  25 mg Oral BID WC  . folic acid  1 mg Oral Daily  . losartan  25 mg Oral Daily  . magnesium oxide  400 mg Oral Daily  . sodium chloride flush  3 mL Intravenous Q12H   Continuous Infusions: . sodium chloride     PRN Meds:.sodium chloride, acetaminophen, albuterol, ondansetron (ZOFRAN) IV, sodium chloride flush  Assessment/Plan: Acute on chronic left heart systolic failure Ischemic  cardiomyopathy Paroxysmal atrial fibrillation Paroxysmal sustained VT ICD firing S/P RCA stent Type 2 DM H/O DVT and PE Biventricular pacemaker/ICD  Hold additional diuresis in anticipation of cardiac catheterization, low systolic blood pressure and slowly rising BUN/Cr. Nuclear stress test in AM for ischemia burden. Hold apixaban post stress test tomorrow if cardiac catheterization is needed. Increase acitivity as tolerated.   LOS: 2 days   Dixie Dials  MD  11/09/2016, 10:10 PM

## 2016-11-09 NOTE — Progress Notes (Signed)
Notified by central telemetry, pt had 5 beat run V-tach, pt a-symptomatic upon assessment, will notify MD.

## 2016-11-10 ENCOUNTER — Inpatient Hospital Stay (HOSPITAL_COMMUNITY): Payer: Medicare HMO

## 2016-11-10 LAB — BASIC METABOLIC PANEL
Anion gap: 6 (ref 5–15)
BUN: 21 mg/dL — ABNORMAL HIGH (ref 6–20)
CO2: 28 mmol/L (ref 22–32)
Calcium: 9.6 mg/dL (ref 8.9–10.3)
Chloride: 101 mmol/L (ref 101–111)
Creatinine, Ser: 1.12 mg/dL — ABNORMAL HIGH (ref 0.44–1.00)
GFR calc Af Amer: 58 mL/min — ABNORMAL LOW (ref >60)
GFR calc non Af Amer: 50 mL/min — ABNORMAL LOW (ref >60)
Glucose, Bld: 159 mg/dL — ABNORMAL HIGH (ref 65–99)
Potassium: 4.3 mmol/L (ref 3.5–5.1)
Sodium: 135 mmol/L (ref 135–145)

## 2016-11-10 MED ORDER — TECHNETIUM TC 99M TETROFOSMIN IV KIT
30.0000 | PACK | Freq: Once | INTRAVENOUS | Status: AC | PRN
  Administered 2016-11-10: 30 via INTRAVENOUS

## 2016-11-10 MED ORDER — TECHNETIUM TC 99M TETROFOSMIN IV KIT
10.0000 | PACK | Freq: Once | INTRAVENOUS | Status: AC | PRN
  Administered 2016-11-10: 10 via INTRAVENOUS

## 2016-11-10 MED ORDER — REGADENOSON 0.4 MG/5ML IV SOLN
INTRAVENOUS | Status: AC
  Administered 2016-11-10: 0.4 mg via INTRAVENOUS
  Filled 2016-11-10: qty 5

## 2016-11-10 MED ORDER — REGADENOSON 0.4 MG/5ML IV SOLN
0.4000 mg | Freq: Once | INTRAVENOUS | Status: AC
  Administered 2016-11-10: 0.4 mg via INTRAVENOUS
  Filled 2016-11-10: qty 5

## 2016-11-10 NOTE — Care Management Note (Signed)
Case Management Note  Patient Details  Name: Shelley Douglas MRN: [id number] Date of Birth: 13-Jan-1950  Subjective/Objective: Pt presented for Acute on Chronic Heart Failure. Plan for stress test today. PTA from home and independent.                    Action/Plan: No Home needs identified.   Expected Discharge Date:                  Expected Discharge Plan:  Home/Self Care  In-House Referral:  NA  Discharge planning Services  CM Consult  Post Acute Care Choice:  NA Choice offered to:  NA  DME Arranged:  N/A DME Agency:  NA  HH Arranged:  NA HH Agency:  NA  Status of Service:  Completed, signed off  If discussed at [place] of Stay Meetings, dates discussed:    Additional Comments:  Bethena Roys, RN 11/10/2016, 10:29 AM

## 2016-11-10 NOTE — Progress Notes (Signed)
Ref: Elwyn Reach, MD   Subjective:  Awake. Her nuclear stress test has intermediate risk due to poor LV systolic function. She does not have significant ischemia burden.  Objective:  Vital Signs in the last 24 hours: Temp:  [97.8 F (36.6 C)-98.1 F (36.7 C)] 98 F (36.7 C) (11/07 2121) Pulse Rate:  [69-75] 69 (11/07 2121) Cardiac Rhythm: Ventricular paced (11/07 1900) Resp:  [18] 18 (11/07 1418) BP: (115-148)/(61-80) 117/69 (11/07 2121) SpO2:  [93 %-99 %] 98 % (11/07 2121) Weight:  [103.1 kg (227 lb 3.2 oz)] 103.1 kg (227 lb 3.2 oz) (11/07 0508)  Physical Exam: BP Readings from Last 1 Encounters:  11/10/16 117/69     Wt Readings from Last 1 Encounters:  11/10/16 103.1 kg (227 lb 3.2 oz)    Weight change: 0.59 kg (1 lb 4.8 oz) Body mass index is 35.58 kg/m. HEENT: [place]/AT, Eyes-Brown, PERL, EOMI, Conjunctiva-Pink, Sclera-Non-icteric Neck: No JVD, No bruit, Trachea midline. Lungs:  Clear, Bilateral. Cardiac:  Regular rhythm, normal S1 and S2, no S3. II/VI systolic murmur. Abdomen:  Soft, non-tender. BS present. Extremities:  Trace edema present. No cyanosis. No clubbing. CNS: AxOx3, Cranial nerves grossly intact, moves all 4 extremities.  Skin: Warm and dry.   Intake/Output from previous day: 11/06 0701 - 11/07 0700 In: 838 [P.O.:838] Out: 1500 [Urine:1500]    Lab Results: BMET    Component Value Date/Time   NA 135 11/10/2016 0249   NA 135 11/09/2016 0341   NA 136 11/08/2016 0203   K 4.3 11/10/2016 0249   K 3.9 11/09/2016 0341   K 4.0 11/08/2016 0203   CL 101 11/10/2016 0249   CL 101 11/09/2016 0341   CL 103 11/08/2016 0203   CO2 28 11/10/2016 0249   CO2 26 11/09/2016 0341   CO2 27 11/08/2016 0203   GLUCOSE 159 (H) 11/10/2016 0249   GLUCOSE 155 (H) 11/09/2016 0341   GLUCOSE 165 (H) 11/08/2016 0203   BUN 21 (H) 11/10/2016 0249   BUN 20 11/09/2016 0341   BUN 17 11/08/2016 0203   CREATININE 1.12 (H) 11/10/2016 0249   CREATININE 1.20 (H) 11/09/2016  0341   CREATININE 1.11 (H) 11/08/2016 0203   CREATININE 0.93 06/20/2015 1021   CALCIUM 9.6 11/10/2016 0249   CALCIUM 9.6 11/09/2016 0341   CALCIUM 9.6 11/08/2016 0203   GFRNONAA 50 (L) 11/10/2016 0249   GFRNONAA 46 (L) 11/09/2016 0341   GFRNONAA 51 (L) 11/08/2016 0203   GFRAA 58 (L) 11/10/2016 0249   GFRAA 53 (L) 11/09/2016 0341   GFRAA 59 (L) 11/08/2016 0203   CBC    Component Value Date/Time   WBC 7.9 11/07/2016 1019   RBC 4.01 11/07/2016 1019   HGB 11.2 (L) 11/07/2016 1019   HCT 35.8 (L) 11/07/2016 1019   PLT 299 11/07/2016 1019   MCV 89.3 11/07/2016 1019   MCH 27.9 11/07/2016 1019   MCHC 31.3 11/07/2016 1019   RDW 14.8 11/07/2016 1019   LYMPHSABS 1.7 05/12/2016 1725   MONOABS 0.5 05/12/2016 1725   EOSABS 0.2 05/12/2016 1725   BASOSABS 0.0 05/12/2016 1725   HEPATIC Function Panel Recent Labs    05/12/16 1725 11/07/16 1019  PROT 7.5 7.8   HEMOGLOBIN A1C No components found for: HGA1C,  MPG CARDIAC ENZYMES Lab Results  Component Value Date   TROPONINI <0.03 11/08/2016   TROPONINI <0.03 11/07/2016   TROPONINI <0.03 11/07/2016   BNP No results for input(s): PROBNP in the last 8760 hours. TSH Recent Labs  11/07/16 1050  TSH 0.515   CHOLESTEROL Recent Labs    05/14/16 1000  CHOL 241*    Scheduled Meds: . amiodarone  200 mg Oral Daily  . apixaban  5 mg Oral BID  . aspirin EC  81 mg Oral Daily  . carvedilol  25 mg Oral BID WC  . folic acid  1 mg Oral Daily  . losartan  25 mg Oral Daily  . magnesium oxide  400 mg Oral Daily  . sodium chloride flush  3 mL Intravenous Q12H   Continuous Infusions: . sodium chloride     PRN Meds:.sodium chloride, acetaminophen, albuterol, ondansetron (ZOFRAN) IV, sodium chloride flush  Assessment/Plan: Acute on chronic left heart systolic failure Ischemic cardiomyopathy Paroxysmal atrial fibrillation Paroxysmal sustained VT ICD firing Status post RCA stent Type 2 diabetes mellitus History of DVT History of  PE Biventricular pacemaker/ICD  Discussed following heart failure booklet instructions including daily weight and decrease salt and fluid intake. Continue medical therapy. Increase activity. Home in a.m.   LOS: 3 days    Dixie Dials  MD  11/10/2016, 9:41 PM

## 2016-11-11 MED ORDER — SPIRONOLACTONE 25 MG PO TABS: 12.5000 mg | ORAL_TABLET | Freq: Every day | ORAL | Status: DC

## 2016-11-11 MED ORDER — SPIRONOLACTONE 25 MG PO TABS: 12.5000 mg | ORAL_TABLET | Freq: Every day | ORAL | 3 refills | Status: DC

## 2016-11-11 NOTE — Progress Notes (Addendum)
0700 Bedside shift report, pt A&Ox4, denies pain, SOB, updated with POC, will continue to monitor.   0930 Pt medicated, see MAR, assessed, see flowsheet. Awaiting MD for possible D/C orders.   25 Pt discharged home, INT and tele box removed without complications, RN reviewed discharge instructions with pt, new medications, medication instructions, discharge instructions and follow up appointments. Pt understands without assistance. Pt to car via wheelchair with glasses, cell phone, clothing, and bible.

## 2016-11-11 NOTE — Discharge Summary (Addendum)
Physician Discharge Summary  Patient ID: Shelley Douglas MRN: [id number] DOB/AGE: 1950/11/20 66 y.o.  Admit date: 11/07/2016 Discharge date: 11/11/2016  Admission Diagnoses: Acute on chronic left heart systolic failure Paroxysmal atrial fibrillation, CHA2DS2VASc score of 6 Paroxysmal sustained VT ICD firing CAD Status post RCA stent Diabetes mellitus type 2 History of DVT History of pulmonary embolism Biventricular pacemaker/ICD  Discharge Diagnoses:  Principal problem: *Acute on chronic left systolic heart failure ([place])* active Problems: Paroxysmal atrial fibrillation, CHA2DS2VASc score of 6 Paroxysmal sustained VT ICD firing CAD Status post RCA stent Type 2 diabetes mellitus History of DVT History of urinary embolism Biventricular pacemaker/ICD Obesity CKD, 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  She responded very well to IV Lasix her ICD interrogation showed sustained VT followed by ICD firing.  She received IV magnesium and potassium supplement.  Patient was advised to decrease food intake, salt intake and fluid intake. She underwent nuclear stress test that failed to show significant reversible ischemia. She ambulated in hall and was discharged home in stable condition with follow-up by me in 1 week and by primary care physician in 1 month.  Consults: cardiology  Significant Diagnostic Studies: labs: Normal electrolytes mildly elevated creatinine of 1.15 blood sugar  elevated at 155-193 mg. CBC was essentially unremarkable with slightly low hemoglobin of 11.2 11.9 g.  Troponin was negative x3.  Dig level was very low (patient has stopped taking the medicine). TSH was normal.  Lipase were normal. BNP was elevated at 920.3.  EKG was sinus tachycardia with evidence of biventricular pacing.  Chest x-ray showed chronic cardiomegaly small bilateral pleural effusions.  Nuclear medicine myocardial multi SPECT showed no reversible ischemia inferior infarct hypokinesia of the inferior and septal wall and ejection fraction of 36%.  Sinus  Treatments: cardiac meds: Aspirin, Apixaban, digoxin, furosemide, amiodarone and Losartan  Discharge Exam: Blood pressure 110/68, pulse 70, temperature 98.1 F (36.7 C), temperature source Oral, resp. rate 18, height 5\' 7"  (1.702 m), weight 103.1 kg (227 lb 3.2 oz), SpO2 98 %. General appearance: alert, cooperative and appears stated age. Head: Normocephalic, atraumatic. Eyes: Brown eyes, pink conjunctiva, corneas clear. PERRL, EOM's intact.  Neck: No adenopathy, no carotid bruit, no JVD, supple, symmetrical, trachea midline and thyroid not enlarged. Resp: Clear to auscultation bilaterally. Cardio: Regular rate and rhythm, S1, S2 normal, II/VI systolic murmur, no click, rub or gallop. GI: Soft, non-tender; bowel sounds normal; no organomegaly. Extremities: Trace edema, cyanosis or clubbing. Skin: Warm and dry.  Neurologic: Alert and oriented X 3, normal strength and tone. Normal coordination and gait.  Disposition: 01-Home or Self Care   Allergies as of 11/11/2016      Reactions   Clopidogrel Bisulfate Rash      Medication List    STOP taking these medications   nitroGLYCERIN 0.4 MG SL tablet Commonly known as:  NITROSTAT     TAKE these medications   amiodarone 200 MG tablet Commonly known as:  PACERONE Take 1 tablet (200 mg total) by mouth daily.   apixaban 5 MG Tabs tablet Commonly known  asArne Cleveland Take 1  tablet (5 mg total) by mouth 2 (two) times daily.   aspirin EC 81 MG tablet Take 81 mg daily by mouth.   carvedilol 25 MG tablet Commonly known as:  COREG Take 1 tablet (25 mg total) by mouth 2 (two) times daily with a meal.   digoxin 0.125 MG tablet Commonly known as:  LANOXIN Take 125 mcg by mouth daily.   folic acid 1 MG tablet Commonly known as:  FOLVITE Take 1 mg by mouth daily.   furosemide 40 MG tablet Commonly known as:  LASIX Take 1 tablet (40 mg total) by mouth every Monday, Wednesday, and Friday.   losartan 25 MG tablet Commonly known as:  COZAAR Take 25 mg by mouth daily.   magnesium oxide 400 (241.3 Mg) MG tablet Commonly known as:  MAG-OX Take 1 tablet (400 mg total) by mouth daily.   spironolactone 25 MG tablet Commonly known as:  ALDACTONE Take 0.5 tablets (12.5 mg total) daily by mouth.   VENTOLIN HFA 108 (90 Base) MCG/ACT inhaler Generic drug:  albuterol Inhale 1-2 puffs every 6 (six) hours as needed into the lungs.      Follow-up Information    Elwyn Reach, MD. Schedule an appointment as soon as possible for a visit in 1 month(s).   Specialty:  Internal Medicine Contact information: [place] [address] [phone]        Dixie Dials, MD. Schedule an appointment as soon as possible for a visit in 1 week(s).   Specialty:  Cardiology Contact information: [place] [address] [phone]           Signed: Birdie Riddle 11/11/2016, 9:38 AM

## 2016-11-11 NOTE — Progress Notes (Signed)
Patient has no problem ambulating, she limits fluids (1246mls intake) and stress test was done 11/7.

## 2016-11-12 LAB — CUP PACEART REMOTE DEVICE CHECK
Battery Voltage: 2.98 V
Brady Statistic AP VP Percent: 0.58 %
Brady Statistic AS VS Percent: 0.22 %
Brady Statistic RA Percent Paced: 0.59 %
Brady Statistic RV Percent Paced: 98.93 %
HighPow Impedance: 44 Ohm
HighPow Impedance: 52 Ohm
Implantable Lead Implant Date: 20090720
Implantable Lead Implant Date: 20170627
Implantable Lead Model: 4598
Implantable Lead Model: 5076
Implantable Lead Model: 6947
Lead Channel Impedance Value: 494 Ohm
Lead Channel Impedance Value: 494 Ohm
Lead Channel Impedance Value: 494 Ohm
Lead Channel Impedance Value: 665 Ohm
Lead Channel Impedance Value: 703 Ohm
Lead Channel Pacing Threshold Amplitude: 0.875 V
Lead Channel Pacing Threshold Pulse Width: 0.4 ms
Lead Channel Sensing Intrinsic Amplitude: 4.5 mV
Lead Channel Sensing Intrinsic Amplitude: 4.5 mV
Lead Channel Sensing Intrinsic Amplitude: 6 mV
Lead Channel Sensing Intrinsic Amplitude: 6 mV
Lead Channel Setting Pacing Amplitude: 1.5 V
Lead Channel Setting Pacing Amplitude: 2.5 V
MDC IDC LEAD IMPLANT DT: 20090720
MDC IDC LEAD LOCATION: 753858
MDC IDC LEAD LOCATION: 753859
MDC IDC LEAD LOCATION: 753860
MDC IDC MSMT BATTERY REMAINING LONGEVITY: 82 mo
MDC IDC MSMT LEADCHNL LV IMPEDANCE VALUE: 1026 Ohm
MDC IDC MSMT LEADCHNL LV IMPEDANCE VALUE: 323 Ohm
MDC IDC MSMT LEADCHNL LV IMPEDANCE VALUE: 589 Ohm
MDC IDC MSMT LEADCHNL LV IMPEDANCE VALUE: 646 Ohm
MDC IDC MSMT LEADCHNL LV IMPEDANCE VALUE: 893 Ohm
MDC IDC MSMT LEADCHNL LV IMPEDANCE VALUE: 988 Ohm
MDC IDC MSMT LEADCHNL LV PACING THRESHOLD AMPLITUDE: 1.25 V
MDC IDC MSMT LEADCHNL LV PACING THRESHOLD PULSEWIDTH: 0.4 ms
MDC IDC MSMT LEADCHNL RA PACING THRESHOLD AMPLITUDE: 0.5 V
MDC IDC MSMT LEADCHNL RA PACING THRESHOLD PULSEWIDTH: 0.4 ms
MDC IDC MSMT LEADCHNL RV IMPEDANCE VALUE: 342 Ohm
MDC IDC MSMT LEADCHNL RV IMPEDANCE VALUE: 437 Ohm
MDC IDC PG IMPLANT DT: 20170627
MDC IDC SESS DTM: 20181018102206
MDC IDC SET LEADCHNL LV PACING PULSEWIDTH: 0.4 ms
MDC IDC SET LEADCHNL RV PACING AMPLITUDE: 2 V
MDC IDC SET LEADCHNL RV PACING PULSEWIDTH: 0.4 ms
MDC IDC SET LEADCHNL RV SENSING SENSITIVITY: 0.3 mV
MDC IDC STAT BRADY AP VS PERCENT: 0.01 %
MDC IDC STAT BRADY AS VP PERCENT: 99.19 %

## 2016-11-22 ENCOUNTER — Ambulatory Visit (INDEPENDENT_AMBULATORY_CARE_PROVIDER_SITE_OTHER): Payer: Medicare HMO

## 2016-11-22 DIAGNOSIS — I5022 Chronic systolic (congestive) heart failure: Secondary | ICD-10-CM | POA: Diagnosis not present

## 2016-11-22 DIAGNOSIS — Z95810 Presence of automatic (implantable) cardiac defibrillator: Secondary | ICD-10-CM

## 2016-11-22 NOTE — Progress Notes (Signed)
EPIC Encounter for ICM Monitoring  Patient Name: Shelley Douglas is a 66 y.o. female Date: 11/22/2016 Primary Care Physican: Elwyn Reach, MD Primary [place] Electrophysiologist: Allred Dry Weight:unknown Bi-V Pacing: 99.3%   Observations (3) (07-Nov-2016 to 22-Nov-2016)  AT/AF >= 6 hr for 1 days.  Avg. Ventricular Rate >= 100 bpm during AT/AF (>= 6 hr) for 1 days.  Patient Activity less than 1 hr/day for 2 weeks.     Attempted call to patient and unable to reach.  Left message to return call.  Transmission reviewed.   Hospitalization 11/4 to 11/8 for ICD firing due to sustained VT per hospital note.   Thoracic impedance abnormal suggesting fluid accumulation sinec 11/01/2016 until 11/22/2016, at baseline today.  Prescribed dosage: Furosemide 40 mg 1 tablet as needed but takes itabout twice a week.   Labs: 05/14/2016 Creatinine 1.23, BUN 17, Potassium 4.7, Sodium 135, EGFR 45-52 05/13/2016 Creatinine 0.93, BUN 12, Potassium 4.2, Sodium 137, EGFR >60  05/12/2016 Creatinine 0.90, BUN 12, Potassium 3.9, Sodium 135, EGFR >60   Recommendations: NONE - Unable to reach.  Follow-up plan: ICM clinic phone appointment on 12/23/2016.   Copy of ICM check sent to Dr. Rayann Heman.   3 month ICM trend: 11/22/2016    1 Year ICM trend:       Rosalene Billings, RN 11/22/2016 9:44 AM

## 2016-12-06 ENCOUNTER — Ambulatory Visit
Admission: RE | Admit: 2016-12-06 | Discharge: 2016-12-06 | Disposition: A | Discharge status: Home / Self Care | Payer: Medicare HMO | Source: Ambulatory Visit | Attending: Cardiovascular Disease | Admitting: Cardiovascular Disease

## 2016-12-06 DIAGNOSIS — Z1231 Encounter for screening mammogram for malignant neoplasm of breast: Secondary | ICD-10-CM

## 2016-12-23 ENCOUNTER — Telehealth: Payer: Self-pay | Admitting: Cardiology

## 2016-12-23 ENCOUNTER — Ambulatory Visit (INDEPENDENT_AMBULATORY_CARE_PROVIDER_SITE_OTHER): Payer: Medicare HMO

## 2016-12-23 DIAGNOSIS — I5022 Chronic systolic (congestive) heart failure: Secondary | ICD-10-CM | POA: Diagnosis not present

## 2016-12-23 DIAGNOSIS — Z95810 Presence of automatic (implantable) cardiac defibrillator: Secondary | ICD-10-CM

## 2016-12-23 NOTE — Telephone Encounter (Signed)
Attempted to confirm remote transmission with pt. No answer and was unable to leave a message. Automated message stating that the number is not a working number.

## 2016-12-24 ENCOUNTER — Telehealth: Payer: Self-pay

## 2016-12-24 NOTE — Telephone Encounter (Signed)
Remote ICM transmission received.  Attempted call to patient at home number listed [phone], her cell number [phone] and husbands cell number (DPR) [phone] and none of the number are working numbers.

## 2016-12-24 NOTE — Progress Notes (Signed)
EPIC Encounter for ICM Monitoring  Patient Name: Shelley Douglas is a 66 y.o. female Date: 12/24/2016 Primary Care Physican: Elwyn Reach, MD Primary [place] Electrophysiologist: Allred Dry Weight:unknown Bi-V Pacing: 92/5% (was 99.3%     Clinical Status (22-Nov-2016 to 23-Dec-2016) Treated VT/VF 0 episodes  AT/AF 1 episode  Time in AT/AF 2.8 hr/day (11.5%) Longest AT/AF 4 days Observations (3) (22-Nov-2016 to 23-Dec-2016)  AT/AF >= 6 hr for 4 days.  Avg. Ventricular Rate >= 100 bpm during AT/AF    Attempted call to patient and unable to reach on home phone, cell phone and her husband's cell phone and none are working numbers.  Transmission reviewed.    Thoracic impedance abnormal suggesting fluid accumulation since 12/05/2016.  Prescribed dosage: Furosemide 40 mg 1 tablet as needed but takes itabout twice a week.   Labs: 11/10/2016 Creatinine 1.12, BUN 21, Potassium 4.3, Sodium 135, EGFR 50-58 11/09/2016 Creatinine 1.20, BUN 20, Potassium 3.9, Sodium 135, EGFR 46-53  11/08/2016 Creatinine 1.11, BUN 17, Potassium 4.0, Sodium 136, EGFR 51-59  11/07/2016 Creatinine 1.15, BUN 15, Potassium 4.5, Sodium 138, EGFR 45-52  05/14/2016 Creatinine 1.23, BUN 17, Potassium 4.7, Sodium 135, EGFR 45-52 05/13/2016 Creatinine 0.93, BUN 12, Potassium 4.2, Sodium 137, EGFR >60  05/12/2016 Creatinine 0.90, BUN 12, Potassium 3.9, Sodium 135, EGFR >60  Recommendations: NONE - Unable to reach.  Follow-up plan: ICM clinic phone appointment on 01/06/2017.  Due to make an appointment with Chanetta Marshall, NP.  Last OV with Dr Rayann Heman 10/06/2015  Copy of ICM check sent to Dr. Rayann Heman.   3 month ICM trend: 12/23/2016    AT/AF   1 Year ICM trend:       Rosalene Billings, RN 12/24/2016 9:48 AM

## 2016-12-26 ENCOUNTER — Other Ambulatory Visit: Payer: Self-pay

## 2016-12-26 ENCOUNTER — Emergency Department (HOSPITAL_COMMUNITY)
Admission: EM | Admit: 2016-12-26 | Discharge: 2016-12-26 | Disposition: A | Discharge status: Home / Self Care | Payer: Medicare HMO | Attending: Emergency Medicine | Admitting: Emergency Medicine

## 2016-12-26 ENCOUNTER — Emergency Department (HOSPITAL_COMMUNITY): Payer: Medicare HMO

## 2016-12-26 ENCOUNTER — Encounter (HOSPITAL_COMMUNITY): Payer: Self-pay | Admitting: *Deleted

## 2016-12-26 DIAGNOSIS — I2510 Atherosclerotic heart disease of native coronary artery without angina pectoris: Secondary | ICD-10-CM | POA: Diagnosis not present

## 2016-12-26 DIAGNOSIS — I5022 Chronic systolic (congestive) heart failure: Secondary | ICD-10-CM | POA: Diagnosis not present

## 2016-12-26 DIAGNOSIS — Z79899 Other long term (current) drug therapy: Secondary | ICD-10-CM | POA: Diagnosis not present

## 2016-12-26 DIAGNOSIS — Z7982 Long term (current) use of aspirin: Secondary | ICD-10-CM | POA: Diagnosis not present

## 2016-12-26 DIAGNOSIS — I4892 Unspecified atrial flutter: Secondary | ICD-10-CM | POA: Diagnosis not present

## 2016-12-26 DIAGNOSIS — R0602 Shortness of breath: Secondary | ICD-10-CM | POA: Diagnosis present

## 2016-12-26 DIAGNOSIS — Z87891 Personal history of nicotine dependence: Secondary | ICD-10-CM | POA: Diagnosis not present

## 2016-12-26 DIAGNOSIS — I110 Hypertensive heart disease with heart failure: Secondary | ICD-10-CM | POA: Insufficient documentation

## 2016-12-26 DIAGNOSIS — E119 Type 2 diabetes mellitus without complications: Secondary | ICD-10-CM | POA: Diagnosis not present

## 2016-12-26 LAB — CBC
HEMATOCRIT: 37.1 % (ref 36.0–46.0)
Hemoglobin: 11.5 g/dL — ABNORMAL LOW (ref 12.0–15.0)
MCH: 27.8 pg (ref 26.0–34.0)
MCHC: 31 g/dL (ref 30.0–36.0)
MCV: 89.8 fL (ref 78.0–100.0)
PLATELETS: 311 10*3/uL (ref 150–400)
RBC: 4.13 MIL/uL (ref 3.87–5.11)
RDW: 16.9 % — ABNORMAL HIGH (ref 11.5–15.5)
WBC: 5 10*3/uL (ref 4.0–10.5)

## 2016-12-26 LAB — BASIC METABOLIC PANEL
Anion gap: 8 (ref 5–15)
BUN: 19 mg/dL (ref 6–20)
CHLORIDE: 110 mmol/L (ref 101–111)
CO2: 21 mmol/L — AB (ref 22–32)
CREATININE: 1.09 mg/dL — AB (ref 0.44–1.00)
Calcium: 9.4 mg/dL (ref 8.9–10.3)
GFR calc non Af Amer: 52 mL/min — ABNORMAL LOW (ref >60)
GFR, EST AFRICAN AMERICAN: 60 mL/min — AB (ref >60)
Glucose, Bld: 176 mg/dL — ABNORMAL HIGH (ref 65–99)
POTASSIUM: 4.3 mmol/L (ref 3.5–5.1)
Sodium: 139 mmol/L (ref 135–145)

## 2016-12-26 LAB — I-STAT TROPONIN, ED: Troponin i, poc: 0.04 ng/mL (ref 0.00–0.08)

## 2016-12-26 MED ORDER — DILTIAZEM HCL ER COATED BEADS 240 MG PO CP24
240.0000 mg | ORAL_CAPSULE | Freq: Once | ORAL | Status: DC
  Filled 2016-12-26: qty 1

## 2016-12-26 MED ORDER — FUROSEMIDE 10 MG/ML IJ SOLN
40.0000 mg | Freq: Once | INTRAMUSCULAR | Status: AC
Start: 2016-12-26 — End: 2016-12-26
  Administered 2016-12-26: 40 mg via INTRAVENOUS
  Filled 2016-12-26: qty 4

## 2016-12-26 MED ORDER — METOPROLOL TARTRATE 50 MG PO TABS: 50.0000 mg | ORAL_TABLET | Freq: Two times a day (BID) | ORAL | 0 refills | Status: DC

## 2016-12-26 MED ORDER — DILTIAZEM HCL-DEXTROSE 100-5 MG/100ML-% IV SOLN (PREMIX)
5.0000 mg/h | INTRAVENOUS | Status: DC
  Administered 2016-12-26: 5 mg/h via INTRAVENOUS
  Filled 2016-12-26: qty 100

## 2016-12-26 MED ORDER — DILTIAZEM LOAD VIA INFUSION
20.0000 mg | Freq: Once | INTRAVENOUS | Status: AC
  Administered 2016-12-26: 20 mg via INTRAVENOUS
  Filled 2016-12-26: qty 20

## 2016-12-26 MED ORDER — METOPROLOL TARTRATE 25 MG PO TABS
50.0000 mg | ORAL_TABLET | Freq: Once | ORAL | Status: AC
  Administered 2016-12-26: 50 mg via ORAL
  Filled 2016-12-26: qty 2

## 2016-12-26 NOTE — Discharge Instructions (Signed)
Return for worsening sob.   °

## 2016-12-26 NOTE — ED Provider Notes (Addendum)
[facility] EMERGENCY DEPARTMENT Provider Note   CSN: [id number] Arrival date & time: 12/26/16  1524     History   Chief Complaint Chief Complaint  Patient presents with  . Shortness of Breath  . Cough    HPI Shelley Douglas is a 66 y.o. female.  66 yo F with a chief complaint of shortness of breath.  Going on for the past couple days.  Also having some cough congestion and chest discomfort with it.  She has had a cough for the past couple weeks.  Denies purulent sputum.  Denies fevers.  Denies vomiting or diarrhea.  Has been eating and drinking normally.  She thinks she is more fluid overloaded than normal.  Has a history of heart failure as well as atrial fibrillation.  She is on Eliquis for this has been compliant for the past month.  Also has an IVC filter for multiple prior DVTs.  She denies hemoptysis.  The chest pain is sharp and midsternal.  Pleuritic.   The history is provided by the patient.  Illness  This is a new problem. The current episode started 2 days ago. The problem occurs constantly. The problem has been rapidly worsening. Associated symptoms include chest pain and shortness of breath. Pertinent negatives include no headaches. Nothing aggravates the symptoms. Nothing relieves the symptoms. She has tried nothing for the symptoms. The treatment provided no relief.    Past Medical History:  Diagnosis Date  . AICD (automatic cardioverter/defibrillator) present   . Anxiety    pt denies this hx on 05/12/2016  . CAD (coronary artery disease)    last cath 2016  . Daily headache   . DVT (deep venous thrombosis) ([place]) 1996  . High cholesterol   . History of blood transfusion    "related to pregnancy" (05/12/2016)  . Hypertension    "just started taking RX" (05/12/2016)  . Ischemic cardiomyopathy   . Left bundle branch block   . Mitral valve regurgitation   . Myocardial infarction [facility]) ~ 2009 X2  . Obesity   . Persistent atrial fibrillation  ([place])   . Pulmonary embolism ([place]) 1993  . Seizures ([place]) 1970s   "I had 1; don't know what it was from" (05/12/2016)  . Stroke [facility]) 2001   denies residual on 05/12/2016  . Type II diabetes mellitus [facility])     Patient Active Problem List   Diagnosis Date Noted  . CHF (congestive heart failure) ([place]) 05/13/2016  . Acute on chronic left systolic heart failure ([place]) 05/12/2016  . Chronic systolic dysfunction of left ventricle 07/01/2015  . ACS (acute coronary syndrome) ([place]) 12/07/2014  . Acute left systolic heart failure ([place]) 09/27/2014  . Dizziness 09/24/2014  . Routine gynecological examination 02/07/2012  . Ischemic cardiomyopathy 08/07/2010  . 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 PULMONARY EMBOLISM 01/29/2010  . VENTRICULAR TACHYCARDIA 01/29/2010  . Atrial fibrillation ([place]) 01/29/2010  . Chronic systolic congestive heart failure ([place]) 01/29/2010  . Cerebral artery occlusion with cerebral infarction ([place]) 01/29/2010  . IMPLANTATION OF DEFIBRILLATOR, HX OF 01/29/2010    Past Surgical History:  Procedure Laterality Date  . BILATERAL CARPAL TUNNEL RELEASE Bilateral 1990s  . CARDIAC CATHETERIZATION N/A 09/27/2014   Procedure: Left Heart Cath and Coronary Angiography;  Surgeon: Dixie Dials, MD;  Location: [place] CV LAB;  Service: Cardiovascular;  Laterality: N/A;  . CARDIAC DEFIBRILLATOR PLACEMENT  07/24/2007   Medtronic Secura DR implanted in [place] for primary prevention  .  CORONARY ANGIOPLASTY WITH STENT PLACEMENT  ~ 2009  . CORONARY ARTERY BYPASS GRAFT  ~ 2009  . DILATION AND CURETTAGE OF UTERUS    . EP IMPLANTABLE DEVICE N/A 07/01/2015   BiV ICD upgrade to Medtronic Viva Quad XT CRT-D device for primary prevention  . EXTERNAL EAR SURGERY Right 1990   "skin grafted from my right thigh and put on my right ear"  . HERNIA REPAIR  1990's    "stomach"  . TUBAL LIGATION    . VENA CAVA FILTER PLACEMENT  1993    OB History    Gravida  Para Term Preterm AB Living   4 4 4  0 0 4   SAB TAB Ectopic Multiple Live Births   0 0 0 0         Home Medications    Prior to Admission medications   Medication Sig Start Date End Date Taking? Authorizing Provider  albuterol (PROVENTIL HFA;VENTOLIN HFA) 108 (90 Base) MCG/ACT inhaler Inhale 2 puffs into the lungs every 6 (six) hours as needed for wheezing or shortness of breath.   Yes [provider]  amiodarone (PACERONE) 200 MG tablet Take 1 tablet (200 mg total) by mouth daily. 01/04/15  Yes Dixie Dials, MD  apixaban (ELIQUIS) 5 MG TABS tablet Take 1 tablet (5 mg total) by mouth 2 (two) times daily. 05/14/16  Yes Dixie Dials, MD  aspirin EC 81 MG tablet Take 81 mg daily by mouth.   Yes [provider]  folic acid (FOLVITE) 1 MG tablet Take 1 mg by mouth daily.    Yes [provider]  losartan (COZAAR) 25 MG tablet Take 25 mg by mouth daily. 04/27/16  Yes [provider]  spironolactone (ALDACTONE) 25 MG tablet Take 0.5 tablets (12.5 mg total) daily by mouth. Patient taking differently: Take 25 mg by mouth daily.  11/11/16  Yes Dixie Dials, MD  furosemide (LASIX) 40 MG tablet Take 1 tablet (40 mg total) by mouth every Monday, Wednesday, and Friday. Patient not taking: Reported on 12/26/2016 05/14/16   Dixie Dials, MD  magnesium oxide (MAG-OX) 400 (241.3 Mg) MG tablet Take 1 tablet (400 mg total) by mouth daily. Patient not taking: Reported on 12/26/2016 05/15/16   Dixie Dials, MD  metoprolol tartrate (LOPRESSOR) 50 MG tablet Take 1 tablet (50 mg total) by mouth 2 (two) times daily. 12/26/16   Deno Etienne, DO    Family History Family History  Problem Relation Age of Onset  . Hypertension Unknown     Social History Social History   Tobacco Use  . Smoking status: Former Smoker    Years: 4.00    Types: Cigarettes    Last attempt to quit: 01/05/1988    Years since quitting: 28.9  . Smokeless tobacco: Never Used  . Tobacco comment:  05/12/2016 "someday smoker when I did smoked; never smoked much"  Substance Use Topics  . Alcohol use: No  . Drug use: No     Allergies   Clopidogrel bisulfate   Review of Systems Review of Systems  Constitutional: Negative for chills and fever.  HENT: Negative for congestion and rhinorrhea.   Eyes: Negative for redness and visual disturbance.  Respiratory: Positive for cough, chest tightness and shortness of breath. Negative for wheezing.   Cardiovascular: Positive for chest pain and palpitations.  Gastrointestinal: Negative for nausea and vomiting.  Genitourinary: Negative for dysuria and urgency.  Musculoskeletal: Negative for arthralgias and myalgias.  Skin: Negative for pallor and wound.  Neurological: Negative  for dizziness and headaches.     Physical Exam Updated Vital Signs BP 116/80   Pulse 72   Temp 97.7 F (36.5 C) (Oral)   Resp (!) 25   SpO2 96%   Physical Exam  Constitutional: She is oriented to person, place, and time. She appears well-developed and well-nourished. No distress.  HENT:  Head: Normocephalic and atraumatic.  Eyes: EOM are normal. Pupils are equal, round, and reactive to light.  Neck: Normal range of motion. Neck supple. JVD (up to the jaw) present.  Cardiovascular: Normal rate and regular rhythm. Exam reveals no gallop and no friction rub.  No murmur heard. Pulmonary/Chest: Effort normal. She has no wheezes. She has no rales.  Abdominal: Soft. She exhibits no distension. There is no tenderness.  Musculoskeletal: She exhibits no edema or tenderness.  Neurological: She is alert and oriented to person, place, and time.  Skin: Skin is warm and dry. She is not diaphoretic.  Psychiatric: She has a normal mood and affect. Her behavior is normal.  Nursing note and vitals reviewed.    ED Treatments / Results  Labs (all labs ordered are listed, but only abnormal results are displayed) Labs Reviewed  BASIC METABOLIC PANEL - Abnormal; Notable for  the following components:      Result Value   CO2 21 (*)    Glucose, Bld 176 (*)    Creatinine, Ser 1.09 (*)    GFR calc non Af Amer 52 (*)    GFR calc Af Amer 60 (*)    All other components within normal limits  CBC - Abnormal; Notable for the following components:   Hemoglobin 11.5 (*)    RDW 16.9 (*)    All other components within normal limits  I-STAT TROPONIN, ED    EKG  EKG Interpretation  Date/Time:  Sunday December 26 2016 15:28:53 EST Ventricular Rate:  131 PR Interval:    QRS Duration: 126 QT Interval:  340 QTC Calculation: 502 R Axis:   103 Text Interpretation:  Atrial flutter with 2:1 A-V conduction Rightward axis Non-specific intra-ventricular conduction block Cannot rule out Anterior infarct , age undetermined T wave abnormality, consider inferolateral ischemia Abnormal ECG st changes likely rate related Otherwise no significant change Confirmed by Deno Etienne [phone]) on 12/26/2016 4:02:03 PM       Radiology Dg Chest 2 View  Result Date: 12/26/2016 CLINICAL DATA:  Shortness of breath, cough EXAM: CHEST  2 VIEW COMPARISON:  11/07/2016 FINDINGS: Left AICD remains in place, unchanged. Prior CABG. Cardiomegaly. Small bilateral pleural effusions are again noted with bibasilar atelectasis, similar prior study. IMPRESSION: Small bilateral effusions with bibasilar atelectasis. Cardiomegaly. Electronically Signed   By: Rolm Baptise M.D.   On: 12/26/2016 15:52    Procedures Procedures (including critical care time)  Medications Ordered in ED Medications  diltiazem (CARDIZEM) 1 mg/mL load via infusion 20 mg (20 mg Intravenous Bolus from Bag 12/26/16 1648)    And  diltiazem (CARDIZEM) 100 mg in dextrose 5% 158mL (1 mg/mL) infusion (0 mg/hr Intravenous Stopped 12/26/16 1727)  furosemide (LASIX) injection 40 mg (40 mg Intravenous Given 12/26/16 1642)  metoprolol tartrate (LOPRESSOR) tablet 50 mg (50 mg Oral Given 12/26/16 1736)     Initial Impression / Assessment and  Plan / ED Course  I have reviewed the triage vital signs and the nursing notes.  Pertinent labs & imaging results that were available during my care of the patient were reviewed by me and considered in my medical decision making (see  chart for details).     66 yo F with a chief complaint of shortness of breath.  Going on for the past week and worsening over the past couple days.  The patient is in a regular tachycardia.  With her history of atrial flutter I feel that this is likely diagnosis.  We will give her some diltiazem.  Give a dose of Lasix.  Reassess.  Patient heart rate improved significantly on the diltiazem drip.  No longer having shortness of breath or chest pain.  She was taken off of the drip.  She is requesting the possibility to go home.  I discussed the case with Dr. Terrence Dupont.  He recommended changing her Coreg to metoprolol.  We will start her on 50 mg twice daily.  He thinks she should be able to get an appointment tomorrow morning with Dr. Doylene Canard.  I instructed her to call him.  We observed her for about 45 minutes to an hour after the do not drip.  Continues to be in atrial flutter at a controlled rate.  Discharge home.  CHA2DS2/VAS Stroke Risk Points      6 >= 2 Points: High Risk  1 - 1.99 Points: Medium Risk  0 Points: Low Risk    The patient's score has not changed in the past year.:  No Change     Details    This score determines the patient's risk of having a stroke if the  patient has atrial fibrillation.       Points Metrics  1 Has Congestive Heart Failure:  Yes   1 Has Vascular Disease:  Yes   0 Has Hypertension:  No   1 Age:  49   0 Has Diabetes:  No   2 Had Stroke:  Yes  Had TIA:  No  Had thromboembolism:  No   1 Female:  Yes             CRITICAL CARE Performed by: Cecilio Asper   Total critical care time: 35 minutes  Critical care time was exclusive of separately billable procedures and treating other patients.  Critical care was  necessary to treat or prevent imminent or life-threatening deterioration.  Critical care was time spent personally by me on the following activities: development of treatment plan with patient and/or surrogate as well as nursing, discussions with consultants, evaluation of patient's response to treatment, examination of patient, obtaining history from patient or surrogate, ordering and performing treatments and interventions, ordering and review of laboratory studies, ordering and review of radiographic studies, pulse oximetry and re-evaluation of patient's condition.  6:46 PM:  I have discussed the diagnosis/risks/treatment options with the patient and family and believe the pt to be eligible for discharge home to follow-up with Cards. We also discussed returning to the ED immediately if new or worsening sx occur. We discussed the sx which are most concerning (e.g., sudden worsening sob, palpations, chest pain) that necessitate immediate return. Medications administered to the patient during their visit and any new prescriptions provided to the patient are listed below.  Medications given during this visit Medications  diltiazem (CARDIZEM) 1 mg/mL load via infusion 20 mg (20 mg Intravenous Bolus from Bag 12/26/16 1648)    And  diltiazem (CARDIZEM) 100 mg in dextrose 5% 12mL (1 mg/mL) infusion (0 mg/hr Intravenous Stopped 12/26/16 1727)  furosemide (LASIX) injection 40 mg (40 mg Intravenous Given 12/26/16 1642)  metoprolol tartrate (LOPRESSOR) tablet 50 mg (50 mg Oral Given 12/26/16 1736)  The patient appears reasonably screen and/or stabilized for discharge and I doubt any other medical condition or other [facility] requiring further screening, evaluation, or treatment in the ED at this time prior to discharge.   Final Clinical Impressions(s) / ED Diagnoses   Final diagnoses:  Atrial flutter with rapid ventricular response [facility])    ED Discharge Orders        Ordered    metoprolol tartrate  (LOPRESSOR) 50 MG tablet  2 times daily     12/26/16 1825       Deno Etienne, DO 12/26/16 [place], Kerah Hardebeck, DO 12/28/16 2001

## 2016-12-26 NOTE — ED Triage Notes (Signed)
Pt reports sob for several days. Had recent cough, chest pain occurs when coughing. Denies fever or cough being productive. HR 130 at triage.

## 2017-01-06 ENCOUNTER — Telehealth: Payer: Self-pay

## 2017-01-06 ENCOUNTER — Ambulatory Visit (INDEPENDENT_AMBULATORY_CARE_PROVIDER_SITE_OTHER): Payer: Self-pay

## 2017-01-06 DIAGNOSIS — I5022 Chronic systolic (congestive) heart failure: Secondary | ICD-10-CM

## 2017-01-06 DIAGNOSIS — Z95810 Presence of automatic (implantable) cardiac defibrillator: Secondary | ICD-10-CM

## 2017-01-06 NOTE — Telephone Encounter (Signed)
Remote ICM transmission received.  Attempted call to patient and recording stated phone number, [phone], is not in service.  Unable to reach patient.

## 2017-01-06 NOTE — Progress Notes (Signed)
EPIC Encounter for ICM Monitoring  Patient Name: Shelley Douglas is a 67 y.o. female Date: 01/06/2017 Primary Care Physican: Elwyn Reach, MD Primary [place] Electrophysiologist: Allred Dry Weight:unknown Bi-V Pacing: 28.3% from 26-Dec-2016 to 06-Jan-2017 (was 99.3%on 11/22/16 transmission)   Clinical Status (26-Dec-2016 to 06-Jan-2017) Time in AT/AF 24.0 hr/day (100.0%) Longest AT/AF 17 days AT/AF >= 6 hr for 11 days.  Event Summary ?AT/AF Daily Burden > Threshold  ?Fast V Rate During AT/AF  ?V. Pacing < 90%  ?Night Heart Rate > 85 bpm  ?Low Patient Activity  ?4807 V. Sensing Episodes  ?10 days in AT/AF Since Last Session        Attempted call to patient and unable to reach.  No working phone number.  Transmission reviewed.  ER visit on 12/26/2016 for AF.   Thoracic impedance abnormal suggesting fluid accumulation since 12/04/2016.  Transmission reviewed be device clinic nurse Ria Clock, RN and advised to notify Dr Rayann Heman of AT/AF increasing and V Pacing decreasing.  Message sent for Dr Rayann Heman to review.   Prescribed dosage: Furosemide 40 mg 1 tablet as needed but takes itabout twice a week.   Labs: 12/26/2016 Creatinine 1.09, BUN 19, Potassium 4.3, Sodium 139, EGFR 52-60 11/10/2016 Creatinine 1.12, BUN 21, Potassium 4.3, Sodium 135, EGFR 50-58 11/09/2016 Creatinine 1.20, BUN 20, Potassium 3.9, Sodium 135, EGFR 46-53  11/08/2016 Creatinine 1.11, BUN 17, Potassium 4.0, Sodium 136, EGFR 51-59  11/07/2016 Creatinine 1.15, BUN 15, Potassium 4.5, Sodium 138, EGFR 45-52  05/14/2016 Creatinine 1.23, BUN 17, Potassium 4.7, Sodium 135, EGFR 45-52 05/13/2016 Creatinine 0.93, BUN 12, Potassium 4.2, Sodium 137, EGFR >60  05/12/2016 Creatinine 0.90, BUN 12, Potassium 3.9, Sodium 135, EGFR >60  Recommendations: NONE - Unable to reach.  Follow-up plan: ICM clinic phone appointment on 01/24/2017.     Copy of ICM check sent to Dr. Rayann Heman for review.   3  month ICM trend: 01/06/2017    AT/AF    1 Year ICM trend:       Rosalene Billings, RN 01/06/2017 8:54 AM

## 2017-01-10 NOTE — Progress Notes (Signed)
  Device clinic nurse, Ria Clock will review report with Dr Rayann Heman in office today.

## 2017-01-13 NOTE — Progress Notes (Signed)
Received: 3 days ago  Message Contents  Thompson Grayer, MD  Short, Laurie Panda, RN        I guess we will just let her follow-up with him. If she has not yet seen him, we could also get her in to the AF clinic as an alternative

## 2017-01-13 NOTE — Progress Notes (Signed)
Attempted call to patient at [phone] and recording stated it is not a working number.

## 2017-01-24 ENCOUNTER — Ambulatory Visit (INDEPENDENT_AMBULATORY_CARE_PROVIDER_SITE_OTHER): Payer: Self-pay | Admitting: *Deleted

## 2017-01-24 ENCOUNTER — Telehealth: Payer: Self-pay

## 2017-01-24 DIAGNOSIS — Z95810 Presence of automatic (implantable) cardiac defibrillator: Secondary | ICD-10-CM

## 2017-01-24 DIAGNOSIS — I5022 Chronic systolic (congestive) heart failure: Secondary | ICD-10-CM

## 2017-01-24 DIAGNOSIS — I255 Ischemic cardiomyopathy: Secondary | ICD-10-CM

## 2017-01-24 NOTE — Progress Notes (Signed)
Remote ICD transmission.   

## 2017-01-24 NOTE — Progress Notes (Signed)
EPIC Encounter for ICM Monitoring  Patient Name: Shelley Douglas is a 67 y.o. female Date: 01/24/2017 Primary Care Physican: Elwyn Reach, MD Primary [place] Electrophysiologist: Allred Dry Weight:unknown Bi-V Pacing: 99.4  Observations (3) (07-Jan-2017 to 24-Jan-2017)  AT/AF >= 6 hr for 1 days.  Avg. Ventricular Rate >= 100 bpm during AT/AF (>= 6 hr) for 1 days.  Patient Activity less than 1 hr/day for 2 weeks.     Attempted call to patient and unable to reach.    Transmission reviewed.    Thoracic impedance has returned to normal.  Prescribed dosage: Furosemide 40 mg 1 tablet as needed but takes itabout twice a week.   Labs: 12/26/2016 Creatinine 1.09, BUN 19, Potassium 4.3, Sodium 139, EGFR 52-60 11/10/2016 Creatinine1.12, BUN21, Potassium4.3, Sodium135, EGFR50-58 11/09/2016 Creatinine1.20, BUN20, Potassium3.9, Sodium135, [id number]  11/08/2016 Creatinine1.11, BUN17, Potassium4.0, Sodium136, [id number]  11/04/2018Creatinine 1.15, BUN15, Potassium4.5, QDIYME158, [id number] 05/14/2016 Creatinine 1.23, BUN 17, Potassium 4.7, Sodium 135, EGFR 45-52 05/13/2016 Creatinine 0.93, BUN 12, Potassium 4.2, Sodium 137, EGFR >60  05/12/2016 Creatinine 0.90, BUN 12, Potassium 3.9, Sodium 135, EGFR >60  Recommendations: NONE - Unable to reach.  Follow-up plan: ICM clinic phone appointment on 02/24/2017.    Copy of ICM check sent to Dr. Rayann Heman.   3 month ICM trend: 01/24/2017    AT/AF   1 Year ICM trend:       Rosalene Billings, RN 01/24/2017 2:35 PM

## 2017-01-24 NOTE — Telephone Encounter (Signed)
Attempted ICM call but phone number is not a working number.

## 2017-01-25 ENCOUNTER — Encounter: Payer: Self-pay | Admitting: Cardiology

## 2017-01-26 LAB — CUP PACEART REMOTE DEVICE CHECK
Brady Statistic AP VP Percent: 0.61 %
Brady Statistic AP VS Percent: 0.02 %
Brady Statistic AS VP Percent: 99.25 %
Brady Statistic AS VS Percent: 0.13 %
Brady Statistic RV Percent Paced: 99.41 %
HighPow Impedance: 42 Ohm
HighPow Impedance: 48 Ohm
Implantable Lead Implant Date: 20090720
Implantable Lead Implant Date: 20090720
Implantable Lead Location: 753858
Implantable Lead Location: 753860
Implantable Lead Model: 6947
Lead Channel Impedance Value: 323 Ohm
Lead Channel Impedance Value: 323 Ohm
Lead Channel Impedance Value: 399 Ohm
Lead Channel Impedance Value: 399 Ohm
Lead Channel Impedance Value: 456 Ohm
Lead Channel Impedance Value: 513 Ohm
Lead Channel Impedance Value: 570 Ohm
Lead Channel Impedance Value: 836 Ohm
Lead Channel Impedance Value: 855 Ohm
Lead Channel Pacing Threshold Amplitude: 0.5 V
Lead Channel Pacing Threshold Amplitude: 0.875 V
Lead Channel Pacing Threshold Amplitude: 1.25 V
Lead Channel Sensing Intrinsic Amplitude: 4.25 mV
Lead Channel Sensing Intrinsic Amplitude: 4.25 mV
Lead Channel Setting Pacing Amplitude: 2 V
Lead Channel Setting Pacing Pulse Width: 0.4 ms
Lead Channel Setting Sensing Sensitivity: 0.3 mV
MDC IDC LEAD IMPLANT DT: 20170627
MDC IDC LEAD LOCATION: 753859
MDC IDC MSMT BATTERY REMAINING LONGEVITY: 77 mo
MDC IDC MSMT BATTERY VOLTAGE: 2.98 V
MDC IDC MSMT LEADCHNL LV IMPEDANCE VALUE: 627 Ohm
MDC IDC MSMT LEADCHNL LV IMPEDANCE VALUE: 627 Ohm
MDC IDC MSMT LEADCHNL LV IMPEDANCE VALUE: 779 Ohm
MDC IDC MSMT LEADCHNL LV PACING THRESHOLD PULSEWIDTH: 0.4 ms
MDC IDC MSMT LEADCHNL RA PACING THRESHOLD PULSEWIDTH: 0.4 ms
MDC IDC MSMT LEADCHNL RV IMPEDANCE VALUE: 399 Ohm
MDC IDC MSMT LEADCHNL RV PACING THRESHOLD PULSEWIDTH: 0.4 ms
MDC IDC MSMT LEADCHNL RV SENSING INTR AMPL: 4.875 mV
MDC IDC MSMT LEADCHNL RV SENSING INTR AMPL: 4.875 mV
MDC IDC PG IMPLANT DT: 20170627
MDC IDC SESS DTM: 20190121113425
MDC IDC SET LEADCHNL LV PACING AMPLITUDE: 2.25 V
MDC IDC SET LEADCHNL LV PACING PULSEWIDTH: 0.4 ms
MDC IDC SET LEADCHNL RA PACING AMPLITUDE: 1.5 V
MDC IDC STAT BRADY RA PERCENT PACED: 0.62 %

## 2017-02-24 ENCOUNTER — Ambulatory Visit (INDEPENDENT_AMBULATORY_CARE_PROVIDER_SITE_OTHER): Payer: Self-pay

## 2017-02-24 DIAGNOSIS — I5022 Chronic systolic (congestive) heart failure: Secondary | ICD-10-CM

## 2017-02-24 DIAGNOSIS — Z95810 Presence of automatic (implantable) cardiac defibrillator: Secondary | ICD-10-CM

## 2017-02-25 ENCOUNTER — Telehealth: Payer: Self-pay

## 2017-02-25 NOTE — Telephone Encounter (Signed)
Remote ICM transmission received.  Attempted call to patient and voice mail has not been set up.

## 2017-02-25 NOTE — Progress Notes (Signed)
EPIC Encounter for ICM Monitoring  Patient Name: Shelley Douglas is a 67 y.o. female Date: 02/25/2017 Primary Care Physican: Elwyn Reach, MD Primary [place] Electrophysiologist: Allred Dry Weight:unknown Bi-V Pacing: 99.6%           Attempted call to patient and unable to reach.    Transmission reviewed.    Thoracic impedance normal.  Prescribed dosage: Furosemide 40 mg 1 tablet as needed but takes itabout twice a week.   Labs: 12/26/2016 Creatinine 1.09, BUN 19, Potassium 4.3, Sodium 139, EGFR 52-60 11/10/2016 Creatinine1.12, BUN21, Potassium4.3, Sodium135, EGFR50-58 11/09/2016 Creatinine1.20, BUN20, Potassium3.9, Sodium135, [id number]  11/08/2016 Creatinine1.11, BUN17, Potassium4.0, Sodium136, [id number]  11/04/2018Creatinine 1.15, BUN15, Potassium4.5, VZCHYI502, [id number] 05/14/2016 Creatinine 1.23, BUN 17, Potassium 4.7, Sodium 135, EGFR 45-52 05/13/2016 Creatinine 0.93, BUN 12, Potassium 4.2, Sodium 137, EGFR >60  05/12/2016 Creatinine 0.90, BUN 12, Potassium 3.9, Sodium 135, EGFR >60  Recommendations: NONE - Unable to reach.  Follow-up plan: ICM clinic phone appointment on 03/29/2017.    Copy of ICM check sent to Dr. Rayann Heman.    3 month ICM trend: 02/24/2017    1 Year ICM trend:       Rosalene Billings, RN 02/25/2017 10:21 AM

## 2017-03-29 ENCOUNTER — Telehealth: Payer: Self-pay | Admitting: Cardiology

## 2017-03-29 NOTE — Telephone Encounter (Signed)
Attempted to confirm remote transmission with pt. No answer and was unable to leave a message. Automated message stating that the person your are trying to reach is not accepting calls at this time.

## 2017-04-08 NOTE — Progress Notes (Signed)
No ICM remote transmission received for 03/29/2017 and next ICM transmission scheduled for 05/02/2017.

## 2017-04-25 ENCOUNTER — Ambulatory Visit (INDEPENDENT_AMBULATORY_CARE_PROVIDER_SITE_OTHER): Payer: Medicare HMO | Admitting: *Deleted

## 2017-04-25 ENCOUNTER — Telehealth: Payer: Self-pay

## 2017-04-25 DIAGNOSIS — Z95810 Presence of automatic (implantable) cardiac defibrillator: Secondary | ICD-10-CM

## 2017-04-25 DIAGNOSIS — I5022 Chronic systolic (congestive) heart failure: Secondary | ICD-10-CM | POA: Diagnosis not present

## 2017-04-25 DIAGNOSIS — I255 Ischemic cardiomyopathy: Secondary | ICD-10-CM

## 2017-04-25 NOTE — Progress Notes (Signed)
EPIC Encounter for ICM Monitoring  Patient Name: Shelley Douglas is a 67 y.o. female Date: 04/25/2017 Primary Care Physican: Elwyn Reach, MD Primary [place] Electrophysiologist: Allred Dry Weight:unknown Bi-V Pacing: 98.5%        Attempted call to patient and unable to reach.   Transmission reviewed.    Thoracic impedance normal but was abnormal suggesting fluid accumulation from 04/04/2017 - 04/23/2017.  Prescribed dosage: Furosemide 40 mg 1 tablet every Monday, Wednesday and Friday.   Labs: 12/26/2016 Creatinine 1.09, BUN 19, Potassium 4.3, Sodium 139, EGFR 52-60 11/10/2016 Creatinine1.12, BUN21, Potassium4.3, Sodium135, EGFR50-58 11/09/2016 Creatinine1.20, BUN20, Potassium3.9, Sodium135, [id number]  11/08/2016 Creatinine1.11, BUN17, Potassium4.0, Sodium136, [id number]  11/04/2018Creatinine 1.15, BUN15, Potassium4.5, XVEZBM158, [id number] 05/14/2016 Creatinine 1.23, BUN 17, Potassium 4.7, Sodium 135, EGFR 45-52 05/13/2016 Creatinine 0.93, BUN 12, Potassium 4.2, Sodium 137, EGFR >60  05/12/2016 Creatinine 0.90, BUN 12, Potassium 3.9, Sodium 135, EGFR >60  Recommendations: NONE - Unable to reach.  Follow-up plan: ICM clinic phone appointment on 05/26/2017.    Copy of ICM check sent to Dr. Rayann Heman.   3 month ICM trend: 04/25/2017    1 Year ICM trend:       Rosalene Billings, RN 04/25/2017 11:04 AM

## 2017-04-25 NOTE — Telephone Encounter (Signed)
Remote ICM transmission received.  Attempted call to patient and recording stated the number dialed is not a working number.

## 2017-04-25 NOTE — Progress Notes (Signed)
Remote ICD transmission.   

## 2017-04-26 ENCOUNTER — Encounter: Payer: Self-pay | Admitting: Cardiology

## 2017-04-26 LAB — CUP PACEART REMOTE DEVICE CHECK
Battery Voltage: 2.98 V
Brady Statistic AP VS Percent: 0.01 %
Brady Statistic RA Percent Paced: 0.25 %
Brady Statistic RV Percent Paced: 98.49 %
Date Time Interrogation Session: 20190422073324
HIGH POWER IMPEDANCE MEASURED VALUE: 45 Ohm
HIGH POWER IMPEDANCE MEASURED VALUE: 53 Ohm
Implantable Lead Implant Date: 20170627
Implantable Lead Location: 753859
Implantable Lead Model: 4598
Implantable Lead Model: 5076
Implantable Lead Model: 6947
Implantable Pulse Generator Implant Date: 20170627
Lead Channel Impedance Value: 1026 Ohm
Lead Channel Impedance Value: 456 Ohm
Lead Channel Impedance Value: 513 Ohm
Lead Channel Impedance Value: 665 Ohm
Lead Channel Impedance Value: 665 Ohm
Lead Channel Impedance Value: 893 Ohm
Lead Channel Pacing Threshold Pulse Width: 0.4 ms
Lead Channel Pacing Threshold Pulse Width: 0.4 ms
Lead Channel Pacing Threshold Pulse Width: 0.4 ms
Lead Channel Sensing Intrinsic Amplitude: 8.875 mV
Lead Channel Sensing Intrinsic Amplitude: 8.875 mV
Lead Channel Setting Pacing Amplitude: 1.5 V
Lead Channel Setting Pacing Amplitude: 2.25 V
Lead Channel Setting Pacing Pulse Width: 0.4 ms
MDC IDC LEAD IMPLANT DT: 20090720
MDC IDC LEAD IMPLANT DT: 20090720
MDC IDC LEAD LOCATION: 753858
MDC IDC LEAD LOCATION: 753860
MDC IDC MSMT BATTERY REMAINING LONGEVITY: 75 mo
MDC IDC MSMT LEADCHNL LV IMPEDANCE VALUE: 323 Ohm
MDC IDC MSMT LEADCHNL LV IMPEDANCE VALUE: 456 Ohm
MDC IDC MSMT LEADCHNL LV IMPEDANCE VALUE: 456 Ohm
MDC IDC MSMT LEADCHNL LV IMPEDANCE VALUE: 570 Ohm
MDC IDC MSMT LEADCHNL LV IMPEDANCE VALUE: 646 Ohm
MDC IDC MSMT LEADCHNL LV IMPEDANCE VALUE: 988 Ohm
MDC IDC MSMT LEADCHNL LV PACING THRESHOLD AMPLITUDE: 1.125 V
MDC IDC MSMT LEADCHNL RA PACING THRESHOLD AMPLITUDE: 0.5 V
MDC IDC MSMT LEADCHNL RA SENSING INTR AMPL: 5.375 mV
MDC IDC MSMT LEADCHNL RA SENSING INTR AMPL: 5.375 mV
MDC IDC MSMT LEADCHNL RV IMPEDANCE VALUE: 570 Ohm
MDC IDC MSMT LEADCHNL RV PACING THRESHOLD AMPLITUDE: 0.875 V
MDC IDC SET LEADCHNL RV PACING AMPLITUDE: 2 V
MDC IDC SET LEADCHNL RV PACING PULSEWIDTH: 0.4 ms
MDC IDC SET LEADCHNL RV SENSING SENSITIVITY: 0.3 mV
MDC IDC STAT BRADY AP VP PERCENT: 0.23 %
MDC IDC STAT BRADY AS VP PERCENT: 98.89 %
MDC IDC STAT BRADY AS VS PERCENT: 0.86 %

## 2017-05-26 ENCOUNTER — Ambulatory Visit (INDEPENDENT_AMBULATORY_CARE_PROVIDER_SITE_OTHER): Payer: Medicare HMO

## 2017-05-26 DIAGNOSIS — I5022 Chronic systolic (congestive) heart failure: Secondary | ICD-10-CM | POA: Diagnosis not present

## 2017-05-26 DIAGNOSIS — Z95810 Presence of automatic (implantable) cardiac defibrillator: Secondary | ICD-10-CM | POA: Diagnosis not present

## 2017-05-27 ENCOUNTER — Telehealth: Payer: Self-pay

## 2017-05-27 NOTE — Telephone Encounter (Signed)
Remote ICM transmission received.  Attempted call to patient and number is not a working number.

## 2017-05-27 NOTE — Progress Notes (Signed)
EPIC Encounter for ICM Monitoring  Patient Name: Shelley Douglas is a 67 y.o. female Date: 05/27/2017 Primary Care Physican: Elwyn Reach, MD Primary [place] Electrophysiologist: Allred Dry Weight:unknown Bi-V Pacing: 98.3%       Attempted call to patient and unable to reach.  Transmission reviewed.    Thoracic impedance normal.  Prescribed dosage: Furosemide 40 mg 1 tablet every Monday, Wednesday and Friday.   Labs: 12/26/2016 Creatinine 1.09, BUN 19, Potassium 4.3, Sodium 139, EGFR 52-60 11/10/2016 Creatinine1.12, BUN21, Potassium4.3, Sodium135, EGFR50-58 11/09/2016 Creatinine1.20, BUN20, Potassium3.9, Sodium135, [id number]  11/08/2016 Creatinine1.11, BUN17, Potassium4.0, Sodium136, [id number]  11/04/2018Creatinine 1.15, BUN15, Potassium4.5, STMHDQ222, [id number] 05/14/2016 Creatinine 1.23, BUN 17, Potassium 4.7, Sodium 135, EGFR 45-52 05/13/2016 Creatinine 0.93, BUN 12, Potassium 4.2, Sodium 137, EGFR >60  05/12/2016 Creatinine 0.90, BUN 12, Potassium 3.9, Sodium 135, EGFR >60  Recommendations:NONE - Unable to reach.  Follow-up plan: ICM clinic phone appointment on 06/27/2017.    Copy of ICM check sent to Dr. Rayann Heman.   3 month ICM trend: 05/26/2017    1 Year ICM trend:       Rosalene Billings, RN 05/27/2017 8:58 AM

## 2017-06-27 ENCOUNTER — Ambulatory Visit (INDEPENDENT_AMBULATORY_CARE_PROVIDER_SITE_OTHER): Payer: Medicare HMO

## 2017-06-27 ENCOUNTER — Telehealth: Payer: Self-pay

## 2017-06-27 DIAGNOSIS — I5022 Chronic systolic (congestive) heart failure: Secondary | ICD-10-CM

## 2017-06-27 DIAGNOSIS — Z95810 Presence of automatic (implantable) cardiac defibrillator: Secondary | ICD-10-CM

## 2017-06-27 NOTE — Telephone Encounter (Signed)
Attempted to confirm remote transmission with pt. No answer and was unable to leave a message.   

## 2017-06-30 ENCOUNTER — Telehealth: Payer: Self-pay

## 2017-06-30 NOTE — Progress Notes (Signed)
EPIC Encounter for ICM Monitoring  Patient Name: Shelley Douglas is a 67 y.o. female Date: 06/30/2017 Primary Care Physican: Elwyn Reach, MD Primary [place] Electrophysiologist: Allred Dry Weight:unknown Bi-V Pacing: 98.5%       Attempted call to patient and unable to reach.   Transmission reviewed.    Thoracic impedance normal.  Prescribed dosage: Furosemide 40 mg 1 tabletevery Monday, Wednesday and Friday.  Labs: 12/26/2016 Creatinine 1.09, BUN 19, Potassium 4.3, Sodium 139, EGFR 52-60 11/10/2016 Creatinine1.12, BUN21, Potassium4.3, Sodium135, EGFR50-58 11/09/2016 Creatinine1.20, BUN20, Potassium3.9, Sodium135, [id number]  11/08/2016 Creatinine1.11, BUN17, Potassium4.0, Sodium136, [id number]  11/04/2018Creatinine 1.15, BUN15, Potassium4.5, ODQVHQ016, [id number] 05/14/2016 Creatinine 1.23, BUN 17, Potassium 4.7, Sodium 135, EGFR 45-52 05/13/2016 Creatinine 0.93, BUN 12, Potassium 4.2, Sodium 137, EGFR >60  05/12/2016 Creatinine 0.90, BUN 12, Potassium 3.9, Sodium 135, EGFR >60  Recommendations:  NONE - Unable to reach.  Follow-up plan: ICM clinic phone appointment on 08/04/2017.  Last OV with Dr Rayann Heman was 10/06/2015 and recall appt with Chanetta Marshall, NP was for 10/05/2016.  Copy of ICM check sent to Dr. Rayann Heman.   3 month ICM trend: 06/29/2017    1 Year ICM trend:       Rosalene Billings, RN 06/30/2017 9:35 AM

## 2017-06-30 NOTE — Telephone Encounter (Signed)
Remote ICM transmission received.  Attempted call to patient and recording stated not accepting calls.  No message.

## 2017-08-04 ENCOUNTER — Encounter: Payer: Medicare HMO | Admitting: *Deleted

## 2017-08-04 ENCOUNTER — Telehealth: Payer: Self-pay

## 2017-08-04 NOTE — Telephone Encounter (Signed)
Attempted to confirm remote transmission with pt. No answer and was unable to leave a message.   

## 2017-08-11 NOTE — Progress Notes (Signed)
No ICM remote transmission received for 08/04/2017 and next ICM transmission scheduled for 08/25/2017.

## 2017-08-25 ENCOUNTER — Telehealth: Payer: Self-pay

## 2017-08-25 ENCOUNTER — Ambulatory Visit (INDEPENDENT_AMBULATORY_CARE_PROVIDER_SITE_OTHER): Payer: Medicare HMO

## 2017-08-25 DIAGNOSIS — Z95810 Presence of automatic (implantable) cardiac defibrillator: Secondary | ICD-10-CM

## 2017-08-25 DIAGNOSIS — I5022 Chronic systolic (congestive) heart failure: Secondary | ICD-10-CM | POA: Diagnosis not present

## 2017-08-25 NOTE — Telephone Encounter (Signed)
Remote ICM transmission received.  Attempted call to patient and recording stated wireless customer is not available.

## 2017-08-25 NOTE — Progress Notes (Signed)
EPIC Encounter for ICM Monitoring  Patient Name: Shelley Douglas is a 67 y.o. female Date: 08/25/2017 Primary Care Physican: Elwyn Reach, MD Primary [place] Electrophysiologist: Allred Dry Weight:unknown Bi-V Pacing: 99.1%      Attempted call to patient and unable to reach.  Recording stated wireless customer is not available.  Transmission reviewed.    Thoracic impedance abnormal suggesting fluid accumulation starting 08/20/2017.  Prescribed dosage: Furosemide 40 mg 1 tabletevery Monday, Wednesday and Friday.  Labs: 12/26/2016 Creatinine 1.09, BUN 19, Potassium 4.3, Sodium 139, EGFR 52-60 11/10/2016 Creatinine1.12, BUN21, Potassium4.3, Sodium135, EGFR50-58 11/09/2016 Creatinine1.20, BUN20, Potassium3.9, Sodium135, [id number]  11/08/2016 Creatinine1.11, BUN17, Potassium4.0, Sodium136, [id number]  11/04/2018Creatinine 1.15, BUN15, Potassium4.5, [id number], [id number] 05/14/2016 Creatinine 1.23, BUN 17, Potassium 4.7, Sodium 135, EGFR 45-52 05/13/2016 Creatinine 0.93, BUN 12, Potassium 4.2, Sodium 137, EGFR >60  05/12/2016 Creatinine 0.90, BUN 12, Potassium 3.9, Sodium 135, EGFR >60  Recommendations: NONE - Unable to reach.  Follow-up plan: ICM clinic phone appointment on 08/31/2017 to recheck fluid levels.   Message sent to scheduler to call patient to schedule appointment with Dr Rayann Heman (last visit 10/06/2015).    Copy of ICM check sent to Dr. Rayann Heman.   3 month ICM trend: 08/25/2017    1 Year ICM trend:       Rosalene Billings, RN 08/25/2017 11:07 AM

## 2017-08-31 ENCOUNTER — Telehealth: Payer: Self-pay

## 2017-08-31 ENCOUNTER — Ambulatory Visit (INDEPENDENT_AMBULATORY_CARE_PROVIDER_SITE_OTHER): Payer: Medicare HMO

## 2017-08-31 ENCOUNTER — Encounter: Payer: Self-pay | Admitting: Internal Medicine

## 2017-08-31 DIAGNOSIS — I5022 Chronic systolic (congestive) heart failure: Secondary | ICD-10-CM

## 2017-08-31 DIAGNOSIS — Z95810 Presence of automatic (implantable) cardiac defibrillator: Secondary | ICD-10-CM

## 2017-08-31 NOTE — Telephone Encounter (Signed)
Remote ICM transmission received.  Attempted call to patient and recording stated wireless customer is not available.

## 2017-08-31 NOTE — Progress Notes (Signed)
EPIC Encounter for ICM Monitoring  Patient Name: Shelley Douglas is a 67 y.o. female Date: 08/31/2017 Primary Care Physican: Elwyn Reach, MD Primary [place] Electrophysiologist: Allred Dry Weight:unknown Bi-V Pacing: 99.9%       Attempted call to patient and unable to reach.  Recording stated wireless customer is not available.  Transmission reviewed.      Thoracic impedance returned to normal.  Prescribed dosage: Furosemide 40 mg 1 tabletevery Monday, Wednesday and Friday.  Labs: 12/26/2016 Creatinine 1.09, BUN 19, Potassium 4.3, Sodium 139, EGFR 52-60 11/10/2016 Creatinine1.12, BUN21, Potassium4.3, Sodium135, EGFR50-58 11/09/2016 Creatinine1.20, BUN20, Potassium3.9, Sodium135, [id number]  11/08/2016 Creatinine1.11, BUN17, Potassium4.0, Sodium136, [id number]  11/04/2018Creatinine 1.15, BUN15, Potassium4.5, WLKHVF473, [id number] 05/14/2016 Creatinine 1.23, BUN 17, Potassium 4.7, Sodium 135, EGFR 45-52 05/13/2016 Creatinine 0.93, BUN 12, Potassium 4.2, Sodium 137, EGFR >60  05/12/2016 Creatinine 0.90, BUN 12, Potassium 3.9, Sodium 135, EGFR >60  Recommendations: NONE - Unable to reach.  Follow-up plan: ICM clinic phone appointment on 09/29/2017.   Recall for 10/05/2017 with Chanetta Marshall, NP  Copy of ICM check sent to Dr. Rayann Heman.   3 month ICM trend: 08/31/2017    1 Year ICM trend:       Rosalene Billings, RN 08/31/2017 8:35 AM

## 2017-09-29 ENCOUNTER — Ambulatory Visit (INDEPENDENT_AMBULATORY_CARE_PROVIDER_SITE_OTHER): Payer: Medicare HMO

## 2017-09-29 ENCOUNTER — Ambulatory Visit (INDEPENDENT_AMBULATORY_CARE_PROVIDER_SITE_OTHER): Payer: Medicare HMO | Admitting: *Deleted

## 2017-09-29 ENCOUNTER — Telehealth: Payer: Self-pay

## 2017-09-29 DIAGNOSIS — Z95810 Presence of automatic (implantable) cardiac defibrillator: Secondary | ICD-10-CM

## 2017-09-29 DIAGNOSIS — I5022 Chronic systolic (congestive) heart failure: Secondary | ICD-10-CM | POA: Diagnosis not present

## 2017-09-29 DIAGNOSIS — I255 Ischemic cardiomyopathy: Secondary | ICD-10-CM

## 2017-09-29 NOTE — Telephone Encounter (Signed)
Attempted to confirm remote transmission with pt. No answer and was unable to leave a message.  The recording states the wireless customer can not be reach.

## 2017-09-30 ENCOUNTER — Encounter: Payer: Self-pay | Admitting: Cardiology

## 2017-09-30 ENCOUNTER — Telehealth: Payer: Self-pay

## 2017-09-30 NOTE — Progress Notes (Signed)
EPIC Encounter for ICM Monitoring  Patient Name: Shelley Douglas is a 67 y.o. female Date: 09/30/2017 Primary Care Physican: Elwyn Reach, MD Primary [place] Electrophysiologist: Allred Dry Weight:unknown Bi-V Pacing: 99.8%         Attempted call to patient and unable to reach.    Transmission reviewed.    Thoracic impedance normal.  Prescribed: Furosemide 40 mg 1 tabletevery Monday, Wednesday and Friday.  Labs: 12/26/2016 Creatinine 1.09, BUN 19, Potassium 4.3, Sodium 139, EGFR 52-60 11/10/2016 Creatinine1.12, BUN21, Potassium4.3, Sodium135, EGFR50-58 11/09/2016 Creatinine1.20, BUN20, Potassium3.9, Sodium135, [id number]  11/08/2016 Creatinine1.11, BUN17, Potassium4.0, Sodium136, [id number]  11/04/2018Creatinine 1.15, BUN15, Potassium4.5, WCHENI778, [id number] 05/14/2016 Creatinine 1.23, BUN 17, Potassium 4.7, Sodium 135, EGFR 45-52 05/13/2016 Creatinine 0.93, BUN 12, Potassium 4.2, Sodium 137, EGFR >60  05/12/2016 Creatinine 0.90, BUN 12, Potassium 3.9, Sodium 135, EGFR >60  Recommendations: Unable to reach.  Follow-up plan: ICM clinic phone appointment on 10/31/2017.    Recall for 10/05/2017 with Chanetta Marshall, NP.  Last EP visit was 10/06/2015 with Dr Rayann Heman  Copy of ICM check sent to Dr. Rayann Heman.   3 month ICM trend: 09/30/2017    1 Year ICM trend:       Rosalene Billings, RN 09/30/2017 8:17 AM

## 2017-09-30 NOTE — Telephone Encounter (Signed)
Remote ICM transmission received.  Attempted call to patient and recording states the wireless customer can not be reach.

## 2017-09-30 NOTE — Progress Notes (Signed)
Remote ICD transmission.   

## 2017-10-31 ENCOUNTER — Telehealth: Payer: Self-pay

## 2017-10-31 ENCOUNTER — Ambulatory Visit (INDEPENDENT_AMBULATORY_CARE_PROVIDER_SITE_OTHER): Payer: Medicare HMO

## 2017-10-31 DIAGNOSIS — Z95810 Presence of automatic (implantable) cardiac defibrillator: Secondary | ICD-10-CM

## 2017-10-31 DIAGNOSIS — I5022 Chronic systolic (congestive) heart failure: Secondary | ICD-10-CM

## 2017-10-31 NOTE — Progress Notes (Signed)
EPIC Encounter for ICM Monitoring  Patient Name: Shelley Douglas is a 67 y.o. female Date: 10/31/2017 Primary Care Physican: Elwyn Reach, MD Primary [place] Electrophysiologist: Allred Dry Weight:unknown Bi-V Pacing: 99.9%      Attempted call to patient and unable to reach.    Transmission reviewed.    Thoracic impedance normal.   Prescribed: Furosemide 40 mg 1 tabletevery Monday, Wednesday and Friday.  Labs: 12/26/2016 Creatinine 1.09, BUN 19, Potassium 4.3, Sodium 139, EGFR 52-60 11/10/2016 Creatinine1.12, BUN21, Potassium4.3, Sodium135, EGFR50-58 11/09/2016 Creatinine1.20, BUN20, Potassium3.9, Sodium135, [id number]  11/08/2016 Creatinine1.11, BUN17, Potassium4.0, Sodium136, [id number]  11/04/2018Creatinine 1.15, BUN15, Potassium4.5, [id number], [id number] 05/14/2016 Creatinine 1.23, BUN 17, Potassium 4.7, Sodium 135, EGFR 45-52 05/13/2016 Creatinine 0.93, BUN 12, Potassium 4.2, Sodium 137, EGFR >60  05/12/2016 Creatinine 0.90, BUN 12, Potassium 3.9, Sodium 135, EGFR >60  Recommendations: Unable to reach.  Follow-up plan: ICM clinic phone appointment on 12/05/2017.   Recall for 10/05/2017 with Chanetta Marshall, NP.  Last EP visit was 10/06/2015 with Dr Rayann Heman    Copy of ICM check sent to Dr. Rayann Heman.   3 month ICM trend: 10/31/2017    1 Year ICM trend:       Rosalene Billings, RN 10/31/2017 11:01 AM

## 2017-10-31 NOTE — Telephone Encounter (Signed)
Remote ICM transmission received.  Attempted call to patient regarding ICM remote transmission and no ring when dialed.

## 2017-11-08 LAB — CUP PACEART REMOTE DEVICE CHECK
Brady Statistic AP VS Percent: 0.02 %
Brady Statistic AS VP Percent: 99.72 %
Brady Statistic AS VS Percent: 0.06 %
Brady Statistic RA Percent Paced: 0.22 %
HIGH POWER IMPEDANCE MEASURED VALUE: 56 Ohm
HighPow Impedance: 45 Ohm
Implantable Lead Implant Date: 20090720
Implantable Lead Location: 753859
Implantable Lead Model: 5076
Implantable Pulse Generator Implant Date: 20170627
Lead Channel Impedance Value: 1026 Ohm
Lead Channel Impedance Value: 1140 Ohm
Lead Channel Impedance Value: 437 Ohm
Lead Channel Impedance Value: 513 Ohm
Lead Channel Impedance Value: 722 Ohm
Lead Channel Pacing Threshold Amplitude: 1.25 V
Lead Channel Pacing Threshold Pulse Width: 0.4 ms
Lead Channel Pacing Threshold Pulse Width: 0.4 ms
Lead Channel Sensing Intrinsic Amplitude: 5.125 mV
Lead Channel Sensing Intrinsic Amplitude: 7.75 mV
Lead Channel Sensing Intrinsic Amplitude: 7.75 mV
Lead Channel Setting Pacing Amplitude: 1.5 V
Lead Channel Setting Pacing Amplitude: 2.5 V
Lead Channel Setting Pacing Pulse Width: 0.4 ms
Lead Channel Setting Pacing Pulse Width: 0.4 ms
MDC IDC LEAD IMPLANT DT: 20090720
MDC IDC LEAD IMPLANT DT: 20170627
MDC IDC LEAD LOCATION: 753858
MDC IDC LEAD LOCATION: 753860
MDC IDC MSMT BATTERY REMAINING LONGEVITY: 62 mo
MDC IDC MSMT BATTERY VOLTAGE: 2.97 V
MDC IDC MSMT LEADCHNL LV IMPEDANCE VALUE: 1178 Ohm
MDC IDC MSMT LEADCHNL LV IMPEDANCE VALUE: 342 Ohm
MDC IDC MSMT LEADCHNL LV IMPEDANCE VALUE: 532 Ohm
MDC IDC MSMT LEADCHNL LV IMPEDANCE VALUE: 665 Ohm
MDC IDC MSMT LEADCHNL LV IMPEDANCE VALUE: 722 Ohm
MDC IDC MSMT LEADCHNL LV IMPEDANCE VALUE: 779 Ohm
MDC IDC MSMT LEADCHNL RA IMPEDANCE VALUE: 456 Ohm
MDC IDC MSMT LEADCHNL RA PACING THRESHOLD AMPLITUDE: 0.5 V
MDC IDC MSMT LEADCHNL RA SENSING INTR AMPL: 5.125 mV
MDC IDC MSMT LEADCHNL RV IMPEDANCE VALUE: 380 Ohm
MDC IDC MSMT LEADCHNL RV PACING THRESHOLD AMPLITUDE: 0.875 V
MDC IDC MSMT LEADCHNL RV PACING THRESHOLD PULSEWIDTH: 0.4 ms
MDC IDC SESS DTM: 20190927103624
MDC IDC SET LEADCHNL RV PACING AMPLITUDE: 2 V
MDC IDC SET LEADCHNL RV SENSING SENSITIVITY: 0.3 mV
MDC IDC STAT BRADY AP VP PERCENT: 0.21 %
MDC IDC STAT BRADY RV PERCENT PACED: 99.81 %

## 2017-12-01 ENCOUNTER — Emergency Department (HOSPITAL_COMMUNITY): Payer: Medicare HMO

## 2017-12-01 ENCOUNTER — Other Ambulatory Visit: Payer: Self-pay

## 2017-12-01 ENCOUNTER — Emergency Department (HOSPITAL_COMMUNITY)
Admission: EM | Admit: 2017-12-01 | Discharge: 2017-12-01 | Disposition: A | Discharge status: Home / Self Care | Payer: Medicare HMO | Attending: Emergency Medicine | Admitting: Emergency Medicine

## 2017-12-01 DIAGNOSIS — S0993XA Unspecified injury of face, initial encounter: Secondary | ICD-10-CM | POA: Diagnosis present

## 2017-12-01 DIAGNOSIS — Z7901 Long term (current) use of anticoagulants: Secondary | ICD-10-CM | POA: Insufficient documentation

## 2017-12-01 DIAGNOSIS — Z87891 Personal history of nicotine dependence: Secondary | ICD-10-CM | POA: Insufficient documentation

## 2017-12-01 DIAGNOSIS — S8001XA Contusion of right knee, initial encounter: Secondary | ICD-10-CM | POA: Diagnosis not present

## 2017-12-01 DIAGNOSIS — Z8673 Personal history of transient ischemic attack (TIA), and cerebral infarction without residual deficits: Secondary | ICD-10-CM | POA: Diagnosis not present

## 2017-12-01 DIAGNOSIS — I2510 Atherosclerotic heart disease of native coronary artery without angina pectoris: Secondary | ICD-10-CM | POA: Diagnosis not present

## 2017-12-01 DIAGNOSIS — E119 Type 2 diabetes mellitus without complications: Secondary | ICD-10-CM | POA: Insufficient documentation

## 2017-12-01 DIAGNOSIS — Y92009 Unspecified place in unspecified non-institutional (private) residence as the place of occurrence of the external cause: Secondary | ICD-10-CM | POA: Diagnosis not present

## 2017-12-01 DIAGNOSIS — W010XXA Fall on same level from slipping, tripping and stumbling without subsequent striking against object, initial encounter: Secondary | ICD-10-CM | POA: Insufficient documentation

## 2017-12-01 DIAGNOSIS — Y999 Unspecified external cause status: Secondary | ICD-10-CM | POA: Insufficient documentation

## 2017-12-01 DIAGNOSIS — W19XXXA Unspecified fall, initial encounter: Secondary | ICD-10-CM

## 2017-12-01 DIAGNOSIS — Y9301 Activity, walking, marching and hiking: Secondary | ICD-10-CM | POA: Insufficient documentation

## 2017-12-01 DIAGNOSIS — S00531A Contusion of lip, initial encounter: Secondary | ICD-10-CM | POA: Diagnosis not present

## 2017-12-01 DIAGNOSIS — Z950 Presence of cardiac pacemaker: Secondary | ICD-10-CM | POA: Diagnosis not present

## 2017-12-01 DIAGNOSIS — Z79899 Other long term (current) drug therapy: Secondary | ICD-10-CM | POA: Insufficient documentation

## 2017-12-01 MED ORDER — ACETAMINOPHEN 325 MG PO TABS
650.0000 mg | ORAL_TABLET | Freq: Once | ORAL | Status: AC
  Administered 2017-12-01: 650 mg via ORAL
  Filled 2017-12-01: qty 2

## 2017-12-01 NOTE — ED Notes (Signed)
Patient verbalizes understanding of discharge instructions. Opportunity for questioning and answers were provided. Armband removed by staff, pt discharged from ED. Pt wheeled to lobby with family.  

## 2017-12-01 NOTE — ED Triage Notes (Addendum)
Pt endorses tripping over cords at home yesterday and falling onto chest and face. Pt denies any syncopal episode. 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 Pt is on eliquis. Pt ambulatory reports left sided rib cage pain and some right knee pain. Pt is alert and oriented.

## 2017-12-01 NOTE — ED Provider Notes (Signed)
[facility] EMERGENCY DEPARTMENT Provider Note   CSN: [id number] Arrival date & time: 12/01/17  1213     History   Chief Complaint Chief Complaint  Patient presents with  . Fall    HPI Shelley Douglas is a 67 y.o. female.  HPI  37 female on Eliquis who had a mechanical fall yesterday and landed on her left chest.  He injured her left lip.  She denies any breaking of tooth.  She did not strike the back of her head or lose consciousness.  Has noted some right knee pain but has been ambulatory on her knee without difficulty.  She denies any substernal chest pain, dyspnea, abdominal pain, lightheadedness, or bleeding.  Past Medical History:  Diagnosis Date  . AICD (automatic cardioverter/defibrillator) present   . Anxiety    pt denies this hx on 05/12/2016  . CAD (coronary artery disease)    last cath 2016  . Daily headache   . DVT (deep venous thrombosis) ([place]) 1996  . High cholesterol   . History of blood transfusion    "related to pregnancy" (05/12/2016)  . Hypertension    "just started taking RX" (05/12/2016)  . Ischemic cardiomyopathy   . Left bundle branch block   . Mitral valve regurgitation   . Myocardial infarction [facility]) ~ 2009 X2  . Obesity   . Persistent atrial fibrillation ([place])   . Pulmonary embolism ([place]) 1993  . Seizures ([place]) 1970s   "I had 1; don't know what it was from" (05/12/2016)  . Stroke [facility]) 2001   denies residual on 05/12/2016  . Type II diabetes mellitus [facility])     Patient Active Problem List   Diagnosis Date Noted  . CHF (congestive heart failure) ([place]) 05/13/2016  . Acute on chronic left systolic heart failure ([place]) 05/12/2016  . Chronic systolic dysfunction of left ventricle 07/01/2015  . ACS (acute coronary syndrome) ([place]) 12/07/2014  . Acute left systolic heart failure ([place]) 09/27/2014  . Dizziness 09/24/2014  . Routine gynecological examination 02/07/2012  . Ischemic cardiomyopathy 08/07/2010  . 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 PULMONARY EMBOLISM 01/29/2010  . VENTRICULAR TACHYCARDIA 01/29/2010  . Atrial fibrillation ([place]) 01/29/2010  . Chronic systolic congestive heart failure ([place]) 01/29/2010  . Cerebral artery occlusion with cerebral infarction ([place]) 01/29/2010  . IMPLANTATION OF DEFIBRILLATOR, HX OF 01/29/2010    Past Surgical History:  Procedure Laterality Date  . BILATERAL CARPAL TUNNEL RELEASE Bilateral 1990s  . CARDIAC CATHETERIZATION N/A 09/27/2014   Procedure: Left Heart Cath and Coronary Angiography;  Surgeon: Dixie Dials, MD;  Location: [facility];  Service: Cardiovascular;  Laterality: N/A;  . CARDIAC DEFIBRILLATOR PLACEMENT  07/24/2007   Medtronic Secura DR implanted in [place] for primary prevention  . CORONARY ANGIOPLASTY WITH STENT PLACEMENT  ~ 2009  . CORONARY ARTERY BYPASS GRAFT  ~ 2009  . DILATION AND CURETTAGE OF UTERUS    . EP IMPLANTABLE DEVICE N/A 07/01/2015   BiV ICD upgrade to Medtronic Viva Quad XT CRT-D device for primary prevention  . EXTERNAL EAR SURGERY Right 1990   "skin grafted from my right thigh and put on my right ear"  . HERNIA REPAIR  1990's    "stomach"  . TUBAL LIGATION    . VENA CAVA FILTER PLACEMENT  1993     OB History    Gravida  4   Para  4   Term  4   Preterm  0  AB  0   Living  4     SAB  0   TAB  0   Ectopic  0   Multiple  0   Live Births               Home Medications    Prior to Admission medications   Medication Sig Start Date End Date Taking? Authorizing Provider  amiodarone (PACERONE) 200 MG tablet Take 1 tablet (200 mg total) by mouth daily. 01/04/15  Yes Dixie Dials, MD  apixaban (ELIQUIS) 5 MG TABS tablet Take 1 tablet (5 mg total) by mouth 2 (two) times daily. 05/14/16  Yes Dixie Dials, MD  aspirin EC 81 MG tablet Take 81 mg daily by mouth.   Yes [provider]  folic acid (FOLVITE) 1 MG tablet Take 1 mg by mouth daily.    Yes [provider]  furosemide (LASIX) 40 MG tablet Take 1 tablet (40 mg total) by mouth every Monday, Wednesday, and Friday. 05/14/16  Yes Dixie Dials, MD  losartan (COZAAR) 25 MG tablet Take 25 mg by mouth daily. 04/27/16  Yes [provider]  magnesium oxide (MAG-OX) 400 (241.3 Mg) MG tablet Take 1 tablet (400 mg total) by mouth daily. Patient not taking: Reported on 12/26/2016 05/15/16   Dixie Dials, MD  metoprolol tartrate (LOPRESSOR) 50 MG tablet Take 1 tablet (50 mg total) by mouth 2 (two) times daily. Patient not taking: Reported on 12/01/2017 12/26/16   Deno Etienne, DO  spironolactone (ALDACTONE) 25 MG tablet Take 0.5 tablets (12.5 mg total) daily by mouth. Patient not taking: Reported on 12/01/2017 11/11/16   Dixie Dials, MD    Family History Family History  Problem Relation Age of Onset  . Hypertension Unknown     Social History Social History   Tobacco Use  . Smoking status: Former Smoker    Years: 4.00    Types: Cigarettes    Last attempt to quit: 01/05/1988    Years since quitting: 29.9  . Smokeless tobacco: Never Used  . Tobacco comment: 05/12/2016 "someday smoker when I did smoked; never smoked much"  Substance Use Topics  . Alcohol use: No  . Drug use: No     Allergies   Clopidogrel bisulfate   Review of Systems Review of Systems  All other systems reviewed and are negative.    Physical Exam Updated Vital Signs BP (!) 149/67   Pulse 66   Temp 98.5 F (36.9 C) (Oral)   Resp (!) 22   Ht 1.702 m (5\' 7" )   Wt 103 kg   SpO2 97%   BMI 35.55 kg/m   Physical Exam  Constitutional: She is oriented to person, place, and time. She appears well-developed and well-nourished.  HENT:  Head: Normocephalic.    Eyes: Pupils are equal, round, and reactive to light. Conjunctivae and EOM are normal.  Neck: Normal range of motion. Neck supple.  Cardiovascular: Normal rate, regular rhythm and normal heart sounds.  Pulmonary/Chest: Effort normal and breath  sounds normal.  Chest wall appears atraumatic Pacemaker noted left anterior chest wall without any signs of trauma  Abdominal: Soft. Bowel sounds are normal.  Musculoskeletal: Normal range of motion. She exhibits no tenderness or deformity.  Contusion right knee Distal pulses intact No significant swelling of extremities  Neurological: She is alert and oriented to person, place, and time.  Skin: Skin is warm and dry. Capillary refill takes less than 2 seconds.  Psychiatric: She has a normal mood  and affect. Her behavior is normal.  Nursing note and vitals reviewed.    ED Treatments / Results  Labs (all labs ordered are listed, but only abnormal results are displayed) Labs Reviewed - No data to display  EKG None  Radiology Dg Ribs Unilateral W/chest Left  Result Date: 12/01/2017 CLINICAL DATA:  Pain after fall EXAM: LEFT RIBS AND CHEST - 3+ VIEW COMPARISON:  December 26, 2016 FINDINGS: Stable cardiomegaly. The hila and mediastinum are normal. Stable AICD device. Mild bibasilar opacities, similar in the interval, likely atelectasis with a probable tiny effusion on the right. No pneumothorax. No nodules or masses. No fractures identified. IMPRESSION: No rib fractures noted. No pneumothorax. No acute abnormalities in the chest. Electronically Signed   By: Dorise Bullion III M.D   On: 12/01/2017 13:16   Dg Knee 2 Views Right  Result Date: 12/01/2017 CLINICAL DATA:  Pain after fall EXAM: RIGHT KNEE - 1-2 VIEW COMPARISON:  None FINDINGS: No evidence of fracture, dislocation, or joint effusion. No evidence of arthropathy or other focal bone abnormality. Soft tissues are unremarkable. IMPRESSION: Degenerative changes in the lateral compartment. No acute fracture or effusion noted. Electronically Signed   By: Dorise Bullion III M.D   On: 12/01/2017 13:17   Ct Maxillofacial Wo Contrast  Result Date: 12/01/2017 CLINICAL DATA:  Fall with facial injury.  Lip bleeding and pain. EXAM: CT  MAXILLOFACIAL WITHOUT CONTRAST TECHNIQUE: Multidetector CT imaging of the maxillofacial structures was performed. Multiplanar CT image reconstructions were also generated. COMPARISON:  CT head 05/12/2016. FINDINGS: Osseous: No evidence of acute maxillofacial fracture. The mandible and temporomandibular joints are intact. Orbits: The globes are intact. No evidence of orbital hematoma. The optic nerves and extraocular muscles appear normal. Sinuses: Small mucous retention cyst or polyp inferiorly in the right maxillary sinus. Otherwise clear without mucosal thickening or air-fluid levels. The mastoid air cells and middle ears are clear. Soft tissues: Lip swelling, especially inferiorly on the left. No focal hematoma. Limited intracranial: No acute intracranial findings. Interval right thalamic infarct appears nonacute. Cervical spondylosis noted. IMPRESSION: 1. No evidence of acute maxillofacial fracture or orbital hematoma. 2. Soft tissue swelling around the mouth, especially involving the lower left lip. Electronically Signed   By: Richardean Sale M.D.   On: 12/01/2017 13:44    Procedures Procedures (including critical care time)  Medications Ordered in ED Medications - No data to display   Initial Impression / Assessment and Plan / ED Course  I have reviewed the triage vital signs and the nursing notes.  Pertinent labs & imaging results that were available during my care of the patient were reviewed by me and considered in my medical decision making (see chart for details).      Final Clinical Impressions(s) / ED Diagnoses   Final diagnoses:  Fall, initial encounter  Contusion of lip, initial encounter  Contusion of right knee, initial encounter    ED Discharge Orders    None       Pattricia Boss, MD 12/01/17 1424

## 2017-12-01 NOTE — Discharge Instructions (Addendum)
Return if any headache, confusion, or worsening swelling or pain.

## 2017-12-01 NOTE — ED Notes (Signed)
Patient transported to X-ray 

## 2017-12-05 ENCOUNTER — Ambulatory Visit (INDEPENDENT_AMBULATORY_CARE_PROVIDER_SITE_OTHER): Payer: Medicare HMO

## 2017-12-05 ENCOUNTER — Telehealth: Payer: Self-pay

## 2017-12-05 DIAGNOSIS — Z95810 Presence of automatic (implantable) cardiac defibrillator: Secondary | ICD-10-CM

## 2017-12-05 DIAGNOSIS — I5022 Chronic systolic (congestive) heart failure: Secondary | ICD-10-CM

## 2017-12-05 NOTE — Progress Notes (Signed)
EPIC Encounter for ICM Monitoring  Patient Name: Shelley Douglas is a 67 y.o. female Date: 12/05/2017 Primary Care Physican: Elwyn Reach, MD Primary [place] Electrophysiologist: Allred Bi-V Pacing: 99.8%    Today's Weight:  unknown       Attempted call to patient and unable to reach.  Left message to return call.  Transmission reviewed.    Thoracic impedance abnormal suggesting fluid accumulation starting 11/28/2017.   Prescribed: Furosemide 40 mg 1 tabletevery Monday, Wednesday and Friday.  Labs: 12/26/2016 Creatinine 1.09, BUN 19, Potassium 4.3, Sodium 139, EGFR 52-60 11/10/2016 Creatinine1.12, BUN21, Potassium4.3, Sodium135, EGFR50-58 11/09/2016 Creatinine1.20, BUN20, Potassium3.9, Sodium135, [id number]   Recommendations: Unable to reach.  Follow-up plan: ICM clinic phone appointment on 12/23/2018 to recheck fluid levels.     Copy of ICM check sent to Dr. Rayann Heman.   3 month ICM trend: 12/05/2017    1 Year ICM trend:       Rosalene Billings, RN 12/05/2017 2:28 PM

## 2017-12-05 NOTE — Telephone Encounter (Signed)
Remote ICM transmission received.  Attempted call to patient regarding ICM remote transmission and left message to return call   

## 2017-12-22 ENCOUNTER — Ambulatory Visit (INDEPENDENT_AMBULATORY_CARE_PROVIDER_SITE_OTHER): Payer: Medicare HMO

## 2017-12-22 DIAGNOSIS — I5022 Chronic systolic (congestive) heart failure: Secondary | ICD-10-CM

## 2017-12-22 DIAGNOSIS — Z95810 Presence of automatic (implantable) cardiac defibrillator: Secondary | ICD-10-CM

## 2017-12-22 NOTE — Progress Notes (Signed)
EPIC Encounter for ICM Monitoring  Patient Name: Shelley Douglas is a 67 y.o. female Date: 12/22/2017 Primary Care Physican: Elwyn Reach, MD Primary [place] Electrophysiologist: Allred Bi-V Pacing: 99.7% Today's Weight:  unknown                                                   Heart failure questions reviewed.  Patient symptomatic with tiredness and some SOB and thinks it is related to fluid.  Patient reported she a recent defib office check and labs with Dr Doylene Canard.  Advised to limit salt intake.     Thoracic impedance abnormal suggesting fluid accumulation starting 12/17/2017.   Prescribed: Furosemide 40 mg 1 tabletevery Monday, Wednesday and Friday.  Labs: 12/26/2016 Creatinine 1.09, BUN 19, Potassium 4.3, Sodium 139, EGFR 52-60 11/10/2016 Creatinine1.12, BUN21, Potassium4.3, Sodium135, EGFR50-58 11/09/2016 Creatinine1.20, BUN20, Potassium3.9, Sodium135, [id number]   Recommendations:  Copy of report sent to Dr Rayann Heman and Dr Doylene Canard for review and recommendations if needed for symptoms and device suggesting fluid accumulation.   Follow-up plan: ICM clinic phone appointment on 12/29/2017 to recheck fluid levels.   Advised patient last appt with Dr Rayann Heman was 10/06/2015. Will send message to scheduler to call patient to schedule office appointment with Dr Rayann Heman  Copy of ICM check sent to Dr. Rayann Heman and Dr Doylene Canard.   Attempted call to Dr Merrilee Jansky office today regarding report and office is closed.   3 month ICM trend: 12/22/2017    1 Year ICM trend:       Rosalene Billings, RN 12/22/2017 9:44 AM

## 2017-12-23 NOTE — Progress Notes (Signed)
Attempted call to Dr Merrilee Jansky office and no answer or answering machine.

## 2017-12-23 NOTE — Progress Notes (Signed)
Reviewed with Chanetta Marshall, NP.  Spoke with patient. Advised Amber ordered to take Furosemide 40 mg 1 tablet for next 2 days.  Patient has already taken prescribed dose today.  She will resume prior dosage regimen on Monday, 12/26/2017. Advised BMET is ordered for Monday at [facility] street office and she agreed.

## 2017-12-26 ENCOUNTER — Other Ambulatory Visit: Payer: Medicare HMO

## 2017-12-26 DIAGNOSIS — I5022 Chronic systolic (congestive) heart failure: Secondary | ICD-10-CM

## 2017-12-26 LAB — BASIC METABOLIC PANEL
BUN / CREAT RATIO: 18 (ref 12–28)
BUN: 18 mg/dL (ref 8–27)
CALCIUM: 10 mg/dL (ref 8.7–10.3)
CHLORIDE: 106 mmol/L (ref 96–106)
CO2: 22 mmol/L (ref 20–29)
Creatinine, Ser: 1.02 mg/dL — ABNORMAL HIGH (ref 0.57–1.00)
GFR, EST AFRICAN AMERICAN: 66 mL/min/{1.73_m2} (ref >59)
GFR, EST NON AFRICAN AMERICAN: 57 mL/min/{1.73_m2} — AB (ref >59)
Glucose: 152 mg/dL — ABNORMAL HIGH (ref 65–99)
POTASSIUM: 4.5 mmol/L (ref 3.5–5.2)
SODIUM: 140 mmol/L (ref 134–144)

## 2017-12-29 ENCOUNTER — Telehealth: Payer: Self-pay

## 2017-12-29 ENCOUNTER — Ambulatory Visit (INDEPENDENT_AMBULATORY_CARE_PROVIDER_SITE_OTHER): Payer: Medicare HMO

## 2017-12-29 DIAGNOSIS — I5022 Chronic systolic (congestive) heart failure: Secondary | ICD-10-CM | POA: Diagnosis not present

## 2017-12-29 DIAGNOSIS — I255 Ischemic cardiomyopathy: Secondary | ICD-10-CM

## 2017-12-29 DIAGNOSIS — Z95810 Presence of automatic (implantable) cardiac defibrillator: Secondary | ICD-10-CM

## 2017-12-29 NOTE — Telephone Encounter (Signed)
Spoke with patient and advised of lab results.  Also requested she send remote transmission today to recheck fluid levels.

## 2017-12-30 ENCOUNTER — Telehealth: Payer: Self-pay

## 2017-12-30 DIAGNOSIS — I5022 Chronic systolic (congestive) heart failure: Secondary | ICD-10-CM | POA: Diagnosis not present

## 2017-12-30 NOTE — Progress Notes (Signed)
Received call back from Dr Doylene Canard.  Advised of Optivol transmission results suggesting fluid and patient has gained 3 pounds with occasional shortness of breath.  He recommended she take Furosemide 40 mg 1 tablet daily x 1 week and then recheck if symptoms have resolved and Optivol improved.  Rescheduled next remote transmission for 01/06/2018 (manual send).

## 2017-12-30 NOTE — Telephone Encounter (Signed)
Attempted call back to patient to advise of Dr Merrilee Jansky recommendations and left message to return call today.  Unable to leave detailed message since DPR has different phone number.

## 2017-12-30 NOTE — Telephone Encounter (Signed)
Call to Dr Merrilee Jansky office and spoke with CMA Annarou.  Advised of patient's Optivol transmission result and patient has occasional shortness of breath and 3 lb weight gain within last 2 weeks.  Asked if physician would like to make any recommendations or medication adjustment.  Advised patient takes Furosemide 40 mg 1 tablet every Mon, Wed, and Friday. She will return call after consulting with Dr Doylene Canard.

## 2017-12-30 NOTE — Telephone Encounter (Signed)
Attempted call to patient.  Left voice mail message that have not received any recommendations back from Dr [id number] office and the office may call her directly.  Advised to use ER if condition worsens.  Will recheck fluid levels on 01/03/2018 (manual send).

## 2017-12-30 NOTE — Progress Notes (Signed)
EPIC Encounter for ICM Monitoring  Patient Name: Shelley Douglas is a 67 y.o. female Date: 12/30/2017 Primary Care Physican: Elwyn Reach, MD Primary [place] Electrophysiologist: Allred Bi-V Pacing: 99.5% Today's Weight: 237 lbs       Heart Failure questions reviewed, pt occasional SOB and 3 lb weight gain in the last 2 weeks.  She thinks eating the holiday foods has contributed to weight gain and fluid.    Thoracic impedance abnormalsuggesting fluid accumulation starting 12/17/2017.   Prescribed:Furosemide 40 mg 1 tabletevery Monday, Wednesday and Friday.  Labs: 12/26/2016 Creatinine 1.09, BUN 19, Potassium 4.3, Sodium 139, EGFR 52-60 11/10/2016 Creatinine1.12, BUN21, Potassium4.3, Sodium135, EGFR50-58 11/09/2016 Creatinine1.20, BUN20, Potassium3.9, Sodium135, [id number]  Recommendations: Call to Dr Merrilee Jansky office and spoke with [facility] medical assistant.  Provided symptom information and advised device shows fluid accumulation. She will call back with recommendations.  Follow-up plan: ICM clinic phone appointment on 01/03/2018 (manual send) to recheck fluid levels.   Scheduler attempted patient call 12/27/2017 to schedule overdue appt with Dr Rayann Heman or Chanetta Marshall, NP.  Copy of ICM check sent to Dr. Rayann Heman and phone call to Dr Merrilee Jansky office for recommendations.   3 month ICM trend: 12/29/2017    1 Year ICM trend:       Rosalene Billings, RN 12/30/2017 11:00 AM

## 2017-12-30 NOTE — Progress Notes (Signed)
Spoke with patient. Advised Dr Doylene Canard recommended she take Furosemide 40 mg 1 tablet every morning x 1 week and then return to 1 tablet Mon, Wed and Friday. Will recheck fluid levels 01/06/2018

## 2017-12-30 NOTE — Progress Notes (Signed)
Attempted call back to patient to advise of Dr Merrilee Jansky recommendations.  Left message to return call.

## 2017-12-30 NOTE — Telephone Encounter (Signed)
Spoke with patient. Advised Dr Doylene Canard recommended she take Furosemide 40 mg 1 tablet every morning x 1 week and then return to 1 tablet Mon, Wed and Friday. Will recheck fluid levels 01/06/2018.

## 2017-12-30 NOTE — Progress Notes (Signed)
Remote ICD transmission.   

## 2017-12-30 NOTE — Telephone Encounter (Signed)
Received call back from Dr Doylene Canard.  Advised of Optivol transmission results suggesting fluid and patient has gained 3 pounds with occasional shortness of breath.  He recommended she take Furosemide 40 mg 1 tablet daily x 1 week and then recheck if symptoms have resolved and Optivol improved

## 2017-12-31 LAB — CUP PACEART REMOTE DEVICE CHECK
Battery Remaining Longevity: 59 mo
Battery Voltage: 2.96 V
Brady Statistic AP VP Percent: 0.42 %
Brady Statistic AS VP Percent: 99.48 %
Brady Statistic RA Percent Paced: 0.43 %
Date Time Interrogation Session: 20191227062405
HIGH POWER IMPEDANCE MEASURED VALUE: 44 Ohm
HIGH POWER IMPEDANCE MEASURED VALUE: 53 Ohm
Implantable Lead Implant Date: 20170627
Implantable Lead Location: 753858
Implantable Lead Location: 753859
Implantable Lead Location: 753860
Implantable Lead Model: 4598
Implantable Lead Model: 5076
Implantable Pulse Generator Implant Date: 20170627
Lead Channel Impedance Value: 1045 Ohm
Lead Channel Impedance Value: 437 Ohm
Lead Channel Impedance Value: 494 Ohm
Lead Channel Impedance Value: 589 Ohm
Lead Channel Impedance Value: 665 Ohm
Lead Channel Impedance Value: 760 Ohm
Lead Channel Impedance Value: 969 Ohm
Lead Channel Pacing Threshold Pulse Width: 0.4 ms
Lead Channel Pacing Threshold Pulse Width: 0.4 ms
Lead Channel Sensing Intrinsic Amplitude: 5.5 mV
Lead Channel Sensing Intrinsic Amplitude: 5.5 mV
Lead Channel Setting Pacing Amplitude: 1.5 V
Lead Channel Setting Pacing Amplitude: 2.5 V
Lead Channel Setting Pacing Pulse Width: 0.4 ms
Lead Channel Setting Pacing Pulse Width: 0.4 ms
MDC IDC LEAD IMPLANT DT: 20090720
MDC IDC LEAD IMPLANT DT: 20090720
MDC IDC MSMT LEADCHNL LV IMPEDANCE VALUE: 1083 Ohm
MDC IDC MSMT LEADCHNL LV IMPEDANCE VALUE: 323 Ohm
MDC IDC MSMT LEADCHNL LV IMPEDANCE VALUE: 437 Ohm
MDC IDC MSMT LEADCHNL LV IMPEDANCE VALUE: 722 Ohm
MDC IDC MSMT LEADCHNL LV PACING THRESHOLD AMPLITUDE: 1.25 V
MDC IDC MSMT LEADCHNL RA IMPEDANCE VALUE: 456 Ohm
MDC IDC MSMT LEADCHNL RA PACING THRESHOLD AMPLITUDE: 0.5 V
MDC IDC MSMT LEADCHNL RA SENSING INTR AMPL: 4.875 mV
MDC IDC MSMT LEADCHNL RA SENSING INTR AMPL: 4.875 mV
MDC IDC MSMT LEADCHNL RV IMPEDANCE VALUE: 342 Ohm
MDC IDC MSMT LEADCHNL RV PACING THRESHOLD AMPLITUDE: 0.75 V
MDC IDC MSMT LEADCHNL RV PACING THRESHOLD PULSEWIDTH: 0.4 ms
MDC IDC SET LEADCHNL RV PACING AMPLITUDE: 2 V
MDC IDC SET LEADCHNL RV SENSING SENSITIVITY: 0.3 mV
MDC IDC STAT BRADY AP VS PERCENT: 0.01 %
MDC IDC STAT BRADY AS VS PERCENT: 0.09 %
MDC IDC STAT BRADY RV PERCENT PACED: 99.51 %

## 2018-01-06 NOTE — Progress Notes (Signed)
No ICM remote transmission received for 01/06/2018 and next ICM transmission scheduled for 02/13/2018.

## 2018-01-12 ENCOUNTER — Ambulatory Visit: Payer: Medicare Other | Admitting: Nurse Practitioner

## 2018-01-12 VITALS — BP 142/68 | HR 59 | Ht 67.0 in | Wt 238.0 lb

## 2018-01-12 DIAGNOSIS — I5022 Chronic systolic (congestive) heart failure: Secondary | ICD-10-CM

## 2018-01-12 DIAGNOSIS — I2699 Other pulmonary embolism without acute cor pulmonale: Secondary | ICD-10-CM

## 2018-01-12 DIAGNOSIS — I255 Ischemic cardiomyopathy: Secondary | ICD-10-CM | POA: Diagnosis not present

## 2018-01-12 DIAGNOSIS — I4819 Other persistent atrial fibrillation: Secondary | ICD-10-CM | POA: Diagnosis not present

## 2018-01-12 LAB — CUP PACEART INCLINIC DEVICE CHECK
Implantable Lead Implant Date: 20090720
Implantable Lead Implant Date: 20170627
Implantable Lead Location: 753859
Implantable Lead Model: 5076
Implantable Pulse Generator Implant Date: 20170627
MDC IDC LEAD IMPLANT DT: 20090720
MDC IDC LEAD LOCATION: 753858
MDC IDC LEAD LOCATION: 753860
MDC IDC SESS DTM: 20200109095311

## 2018-01-12 NOTE — Progress Notes (Signed)
Electrophysiology Office Note Date: 01/12/2018  ID:  Shelley Douglas, DOB [birth year], MRN [id number]  PCP: Elwyn Reach, MD Primary Cardiologist: Doylene Canard Electrophysiologist: Allred  CC: Routine ICD follow-up  Shelley Douglas is a 68 y.o. female seen today for Dr Rayann Heman. She has not been seen in office since 10/2015.  She presents today for routine electrophysiology followup.  Since last being seen in our clinic, the patient reports doing relatively well.  She follows closely with Dr Doylene Canard. Since last being seen by [place], she had AF in January of 2019 and was started on amiodarone. She also had a mechanical fall at Thanksgiving.  Her shortness of breath is stable. She reports that Dr Doylene Canard updated her echo and said her EF had improved (she does not know number). She denies chest pain, palpitations, dyspnea (above baseline), PND, orthopnea, nausea, vomiting, dizziness, syncope, edema, weight gain, or early satiety.  She has not had ICD shocks.   Device History: MDT CRTD implanted 2009 for ICM; gen change 2017 History of appropriate therapy: No History of AAD therapy: No   Past Medical History:  Diagnosis Date  . AICD (automatic cardioverter/defibrillator) present   . Anxiety    pt denies this hx on 05/12/2016  . CAD (coronary artery disease)    last cath 2016  . Daily headache   . DVT (deep venous thrombosis) ([place]) 1996  . High cholesterol   . History of blood transfusion    "related to pregnancy" (05/12/2016)  . Hypertension    "just started taking RX" (05/12/2016)  . Ischemic cardiomyopathy   . Left bundle branch block   . Mitral valve regurgitation   . Myocardial infarction [facility]) ~ 2009 X2  . Obesity   . Persistent atrial fibrillation ([place])   . Pulmonary embolism ([place]) 1993  . Seizures ([place]) 1970s   "I had 1; don't know what it was from" (05/12/2016)  . Stroke [facility]) 2001   denies residual on 05/12/2016  . Type II diabetes mellitus ([place])    Past Surgical History:    Procedure Laterality Date  . BILATERAL CARPAL TUNNEL RELEASE Bilateral 1990s  . CARDIAC CATHETERIZATION N/A 09/27/2014   Procedure: Left Heart Cath and Coronary Angiography;  Surgeon: Dixie Dials, MD;  Location: [facility];  Service: Cardiovascular;  Laterality: N/A;  . CARDIAC DEFIBRILLATOR PLACEMENT  07/24/2007   Medtronic Secura DR implanted in [place] for primary prevention  . CORONARY ANGIOPLASTY WITH STENT PLACEMENT  ~ 2009  . CORONARY ARTERY BYPASS GRAFT  ~ 2009  . DILATION AND CURETTAGE OF UTERUS    . EP IMPLANTABLE DEVICE N/A 07/01/2015   BiV ICD upgrade to Medtronic Viva Quad XT CRT-D device for primary prevention  . EXTERNAL EAR SURGERY Right 1990   "skin grafted from my right thigh and put on my right ear"  . HERNIA REPAIR  1990's    "stomach"  . TUBAL LIGATION    . VENA CAVA FILTER PLACEMENT  1993    Current Outpatient Medications  Medication Sig Dispense Refill  . amiodarone (PACERONE) 200 MG tablet Take 1 tablet (200 mg total) by mouth daily. 30 tablet 3  . apixaban (ELIQUIS) 5 MG TABS tablet Take 1 tablet (5 mg total) by mouth 2 (two) times daily. 60 tablet 3  . aspirin EC 81 MG tablet Take 81 mg daily by mouth.    . carvedilol (COREG) 25 MG tablet Take 25 mg by mouth 2 (two) times daily.    Marland Kitchen  digoxin (LANOXIN) 0.125 MG tablet Take 0.125 mcg by mouth daily.    . folic acid (FOLVITE) 1 MG tablet Take 1 mg by mouth daily.     . furosemide (LASIX) 40 MG tablet Take 1 tablet (40 mg total) by mouth every Monday, Wednesday, and Friday.    . losartan (COZAAR) 25 MG tablet Take 25 mg by mouth daily.     No current facility-administered medications for this visit.     Allergies:   Clopidogrel bisulfate   Social History: Social History   Socioeconomic History  . Marital status: Married    Spouse name: Not on file  . Number of children: Not on file  . Years of education: Not on file  . Highest education level: Not on file  Occupational History  .  Occupation: disabled  Social Needs  . Financial resource strain: Not on file  . Food insecurity:    Worry: Not on file    Inability: Not on file  . Transportation needs:    Medical: Not on file    Non-medical: Not on file  Tobacco Use  . Smoking status: Former Smoker    Years: 4.00    Types: Cigarettes    Last attempt to quit: 01/05/1988    Years since quitting: 30.0  . Smokeless tobacco: Never Used  . Tobacco comment: 05/12/2016 "someday smoker when I did smoked; never smoked much"  Substance and Sexual Activity  . Alcohol use: No  . Drug use: No  . Sexual activity: Not Currently  Lifestyle  . Physical activity:    Days per week: Not on file    Minutes per session: Not on file  . Stress: Not on file  Relationships  . Social connections:    Talks on phone: Not on file    Gets together: Not on file    Attends religious service: Not on file    Active member of club or organization: Not on file    Attends meetings of clubs or organizations: Not on file    Relationship status: Not on file  . Intimate partner violence:    Fear of current or ex partner: Not on file    Emotionally abused: Not on file    Physically abused: Not on file    Forced sexual activity: Not on file  Other Topics Concern  . Not on file  Social History Narrative   Pt lives in [place] with spouse.   Disabled    Family History: Family History  Problem Relation Age of Onset  . Hypertension Unknown     Review of Systems: All other systems reviewed and are otherwise negative except as noted above.   Physical Exam: VS:  BP (!) 142/68   Pulse (!) 59   Ht 5\' 7"  (1.702 m)   Wt 238 lb (108 kg)   BMI 37.28 kg/m  , BMI Body mass index is 37.28 kg/m.  GEN- The patient is well appearing, alert and oriented x 3 today.   HEENT: normocephalic, atraumatic; sclera clear, conjunctiva pink; hearing intact; oropharynx clear; neck supple  Lungs- Clear to ausculation bilaterally, normal work of breathing.  No  wheezes, rales, rhonchi Heart- Regular rate and rhythm (paced) GI- soft, non-tender, non-distended, bowel sounds present  Extremities- no clubbing, cyanosis, trace LE edema MS- no significant deformity or atrophy Skin- warm and dry, no rash or lesion; ICD pocket well healed Psych- euthymic mood, full affect Neuro- strength and sensation are intact  ICD interrogation- reviewed in detail  today,  See PACEART report  EKG:  EKG is ordered today. The ekg ordered today shows sinus rhythm with V pacing, rate 59, QRS 164msec  Recent Labs: 12/26/2017: BUN 18; Creatinine, Ser 1.02; Potassium 4.5; Sodium 140   Wt Readings from Last 3 Encounters:  01/12/18 238 lb (108 kg)  12/01/17 227 lb (103 kg)  11/10/16 227 lb 3.2 oz (103.1 kg)     Other studies Reviewed: Additional studies/ records that were reviewed today include: Dr Jackalyn Lombard office notes   Assessment and Plan:  1.  Chronic systolic dysfunction euvolemic today Stable on an appropriate medical regimen Normal ICD function See Pace Art report No changes today Continue follow up in ICM clinic  2.  Prior PE/DVT/persistent atrial fibrillation On Eliquis No bleeding issues  3.  CAD No recent ischemic symptoms  Continue current therapy   Current medicines are reviewed at length with the patient today.   The patient does not have concerns regarding her medicines.  The following changes were made today:  none  Labs/ tests ordered today include: none Orders Placed This Encounter  Procedures  . CUP PACEART [place]  . EKG 12-Lead     Disposition:   Follow up with [facility], Dr Doylene Canard as scheduled, me in 1 year     Signed, Chanetta Marshall, NP 01/12/2018 10:05 AM  [place] [address] [phone] (office) [phone] (fax)

## 2018-01-12 NOTE — Patient Instructions (Signed)
Medication Instructions:  none If you need a refill on your cardiac medications before your next appointment, please call your pharmacy.   Lab work: none If you have labs (blood work) drawn today and your tests are completely normal, you will receive your results only by: Marland Kitchen MyChart Message (if you have MyChart) OR . A paper copy in the mail If you have any lab test that is abnormal or we need to change your treatment, we will call you to review the results.  Testing/Procedures: none  Follow-Up: At [facility], you and your health needs are our priority.  As part of our continuing mission to provide you with exceptional heart care, we have created designated Provider Care Teams.  These Care Teams include your primary Cardiologist (physician) and Advanced Practice Providers (APPs -  Physician Assistants and Nurse Practitioners) who all work together to provide you with the care you need, when you need it. You will need a follow up appointment in 1 years.  Please call our office 2 months in advance to schedule this appointment.  You may see Chanetta Marshall or one of the following Engineer, manufacturing Providers on your designated Care Team:   Chanetta Marshall, NP . Tommye Standard, PA-C  Any Other Special Instructions Will Be Listed Below (If Applicable). Remote monitoring is used to monitor your ICD from home. This monitoring reduces the number of office visits required to check your device to one time per year. It allows [place] to keep an eye on the functioning of your device to ensure it is working properly. You are scheduled for a device check from home on 02/13/2018. You may send your transmission at any time that day. If you have a wireless device, the transmission will be sent automatically. After your physician reviews your transmission, you will receive a postcard with your next transmission date.

## 2018-02-13 ENCOUNTER — Ambulatory Visit (INDEPENDENT_AMBULATORY_CARE_PROVIDER_SITE_OTHER): Payer: Medicare Other

## 2018-02-13 DIAGNOSIS — I5022 Chronic systolic (congestive) heart failure: Secondary | ICD-10-CM | POA: Diagnosis not present

## 2018-02-13 DIAGNOSIS — Z95810 Presence of automatic (implantable) cardiac defibrillator: Secondary | ICD-10-CM

## 2018-02-14 NOTE — Progress Notes (Signed)
EPIC Encounter for ICM Monitoring  Patient Name: Shelley Douglas is a 68 y.o. female Date: 02/14/2018 Primary Care Physican: Elwyn Reach, MD Primary [place] Electrophysiologist: Allred Bi-V Pacing: 99.6% Today's Weight: 237 lbs                                                           Heart Failure questions reviewed, pt asymptomatic.  Thoracic impedance normal.  Prescribed:Furosemide 40 mg 1 tabletevery Monday, Wednesday and Friday.  Labs: 12/26/2016 Creatinine 1.09, BUN 19, Potassium 4.3, Sodium 139, EGFR 52-60 11/10/2016 Creatinine1.12, BUN21, Potassium4.3, Sodium135, EGFR50-58 11/09/2016 Creatinine1.20, BUN20, Potassium3.9, Sodium135, [id number]  Recommendations:  No changes and encouraged to call for fluid symptoms.  Follow-up plan: ICM clinic phone appointment on 03/20/2018.    Copy of ICM check sent to Dr. Rayann Heman.   3 month ICM trend: 02/13/2018    1 Year ICM trend:       Rosalene Billings, RN 02/14/2018 8:16 AM

## 2018-03-21 ENCOUNTER — Telehealth: Payer: Self-pay

## 2018-03-21 NOTE — Telephone Encounter (Signed)
Unable to leave a message for patient to remind of missed remote transmission.  

## 2018-03-27 NOTE — Progress Notes (Signed)
No ICM remote transmission received for 03/20/2018 and next ICM transmission scheduled for 04/04/2018.

## 2018-03-30 ENCOUNTER — Other Ambulatory Visit: Payer: Self-pay

## 2018-03-30 ENCOUNTER — Encounter: Payer: Medicare Other | Admitting: *Deleted

## 2018-03-31 ENCOUNTER — Telehealth: Payer: Self-pay

## 2018-03-31 NOTE — Telephone Encounter (Signed)
Unable to speak  with patient to remind of missed remote transmission 

## 2018-04-04 ENCOUNTER — Other Ambulatory Visit: Payer: Self-pay

## 2018-04-04 ENCOUNTER — Ambulatory Visit (INDEPENDENT_AMBULATORY_CARE_PROVIDER_SITE_OTHER): Payer: Medicare Other

## 2018-04-04 DIAGNOSIS — I5022 Chronic systolic (congestive) heart failure: Secondary | ICD-10-CM | POA: Diagnosis not present

## 2018-04-04 DIAGNOSIS — Z95810 Presence of automatic (implantable) cardiac defibrillator: Secondary | ICD-10-CM | POA: Diagnosis not present

## 2018-04-05 ENCOUNTER — Ambulatory Visit (INDEPENDENT_AMBULATORY_CARE_PROVIDER_SITE_OTHER): Payer: Medicare Other | Admitting: *Deleted

## 2018-04-05 ENCOUNTER — Other Ambulatory Visit: Payer: Self-pay

## 2018-04-05 DIAGNOSIS — I255 Ischemic cardiomyopathy: Secondary | ICD-10-CM

## 2018-04-07 ENCOUNTER — Telehealth: Payer: Self-pay

## 2018-04-07 NOTE — Progress Notes (Signed)
EPIC Encounter for ICM Monitoring  Patient Name: Shelley Douglas is a 68 y.o. female Date: 04/07/2018 Primary Care Physican: Elwyn Reach, MD Primary [place] Electrophysiologist: Allred Bi-V Pacing: 99.6% Last known Weight: 237 lbs   Attempted call to patient and unable to reach.  Transmission reviewed.   Thoracic impedance normal.  Prescribed:Furosemide 40 mg 1 tabletevery Monday, Wednesday and Friday.  Labs: 12/26/2017 Creatinine 1.02, BUN 18, Potassium 4.5, Sodium 140, GFR 57-66 12/26/2016 Creatinine 1.09, BUN 19, Potassium 4.3, Sodium 139, GFR 52-60 11/10/2016 Creatinine1.12, BUN21, Potassium4.3, Sodium135, GFR50-58 11/09/2016 Creatinine1.20, BUN20, Potassium3.9, Sodium135, [id number]  Recommendations: No changes and encouraged to call for fluid symptoms.  Follow-up plan: ICM clinic phone appointment on5/05/2018  Copy of ICM check sent to Dr.Allred.  3 month ICM trend: 04/04/2018    1 Year ICM trend:       Rosalene Billings, RN 04/07/2018 3:32 PM

## 2018-04-07 NOTE — Telephone Encounter (Signed)
Remote ICM transmission received.  Attempted call to patient regarding ICM remote transmission and recording stated person you are trying to reach is not accepting calls at this time.

## 2018-04-10 ENCOUNTER — Encounter: Payer: Self-pay | Admitting: Cardiology

## 2018-04-10 LAB — CUP PACEART REMOTE DEVICE CHECK
Brady Statistic AP VP Percent: 0.38 %
Brady Statistic AP VS Percent: 0.01 %
Brady Statistic AS VP Percent: 99.52 %
Brady Statistic AS VS Percent: 0.08 %
Brady Statistic RA Percent Paced: 0.4 %
HIGH POWER IMPEDANCE MEASURED VALUE: 51 Ohm
HighPow Impedance: 44 Ohm
Implantable Lead Implant Date: 20090720
Implantable Lead Implant Date: 20170627
Implantable Lead Location: 753859
Implantable Lead Model: 4598
Implantable Lead Model: 5076
Lead Channel Impedance Value: 1178 Ohm
Lead Channel Impedance Value: 1235 Ohm
Lead Channel Impedance Value: 304 Ohm
Lead Channel Impedance Value: 342 Ohm
Lead Channel Impedance Value: 456 Ohm
Lead Channel Impedance Value: 456 Ohm
Lead Channel Impedance Value: 513 Ohm
Lead Channel Impedance Value: 589 Ohm
Lead Channel Impedance Value: 703 Ohm
Lead Channel Impedance Value: 760 Ohm
Lead Channel Impedance Value: 817 Ohm
Lead Channel Impedance Value: 988 Ohm
Lead Channel Pacing Threshold Amplitude: 0.5 V
Lead Channel Pacing Threshold Amplitude: 1.375 V
Lead Channel Pacing Threshold Pulse Width: 0.4 ms
Lead Channel Pacing Threshold Pulse Width: 0.4 ms
Lead Channel Pacing Threshold Pulse Width: 0.4 ms
Lead Channel Sensing Intrinsic Amplitude: 6.125 mV
Lead Channel Sensing Intrinsic Amplitude: 6.125 mV
Lead Channel Setting Pacing Amplitude: 1.5 V
Lead Channel Setting Pacing Amplitude: 2 V
Lead Channel Setting Pacing Amplitude: 2.5 V
Lead Channel Setting Pacing Pulse Width: 0.4 ms
Lead Channel Setting Pacing Pulse Width: 0.4 ms
Lead Channel Setting Sensing Sensitivity: 0.3 mV
MDC IDC LEAD IMPLANT DT: 20090720
MDC IDC LEAD LOCATION: 753858
MDC IDC LEAD LOCATION: 753860
MDC IDC MSMT BATTERY REMAINING LONGEVITY: 49 mo
MDC IDC MSMT BATTERY VOLTAGE: 2.97 V
MDC IDC MSMT LEADCHNL LV IMPEDANCE VALUE: 722 Ohm
MDC IDC MSMT LEADCHNL RA SENSING INTR AMPL: 3.75 mV
MDC IDC MSMT LEADCHNL RA SENSING INTR AMPL: 3.75 mV
MDC IDC MSMT LEADCHNL RV PACING THRESHOLD AMPLITUDE: 0.625 V
MDC IDC PG IMPLANT DT: 20170627
MDC IDC SESS DTM: 20200331073624
MDC IDC STAT BRADY RV PERCENT PACED: 99.61 %

## 2018-04-10 NOTE — Progress Notes (Signed)
Remote ICD transmission.   

## 2018-05-09 ENCOUNTER — Ambulatory Visit (INDEPENDENT_AMBULATORY_CARE_PROVIDER_SITE_OTHER): Payer: Medicare Other

## 2018-05-09 ENCOUNTER — Other Ambulatory Visit: Payer: Self-pay

## 2018-05-09 DIAGNOSIS — Z95810 Presence of automatic (implantable) cardiac defibrillator: Secondary | ICD-10-CM

## 2018-05-09 DIAGNOSIS — I5022 Chronic systolic (congestive) heart failure: Secondary | ICD-10-CM

## 2018-05-12 NOTE — Progress Notes (Signed)
EPIC Encounter for ICM Monitoring  Patient Name: Shelley Douglas is a 68 y.o. female Date: 05/12/2018 Primary Care Physican: Elwyn Reach, MD Primary [place] Electrophysiologist: Allred Bi-V Pacing: 99.5% Last known Weight: 237 lbs   Transmission reviewed.   Thoracic impedance normal.  Prescribed:Furosemide 40 mg 1 tabletevery Monday, Wednesday and Friday.  Labs: 12/26/2017 Creatinine 1.02, BUN 18, Potassium 4.5, Sodium 140, GFR 57-66 12/26/2016 Creatinine 1.09, BUN 19, Potassium 4.3, Sodium 139, GFR 52-60 11/10/2016 Creatinine1.12, BUN21, Potassium4.3, Sodium135, GFR50-58 11/09/2016 Creatinine1.20, BUN20, Potassium3.9, Sodium135, [id number]  Recommendations:None  Follow-up plan: ICM clinic phone appointment on 06/12/2018  Copy of ICM check sent to Dr.Allred.  3 month ICM trend: 05/09/2018    1 Year ICM trend:       Rosalene Billings, RN 05/12/2018 10:28 AM

## 2018-05-30 IMAGING — CT CT HEAD CODE STROKE
3 series · 15 of 47 positions shown, 18 images · non-contrast
Comparison: Unsteady gait with right-sided weakness

CLINICAL DATA: Code stroke.

EXAM:
CT HEAD WITHOUT CONTRAST
TECHNIQUE: Contiguous axial images were obtained from the base of the skull
through the vertex without intravenous contrast.

[Series 3: head 5.0 h30s · axial · 0.42mm/px · z∈[-157,-27]mm · 9 of 32 slices shown, 12 images]
[im 3/32  brain]
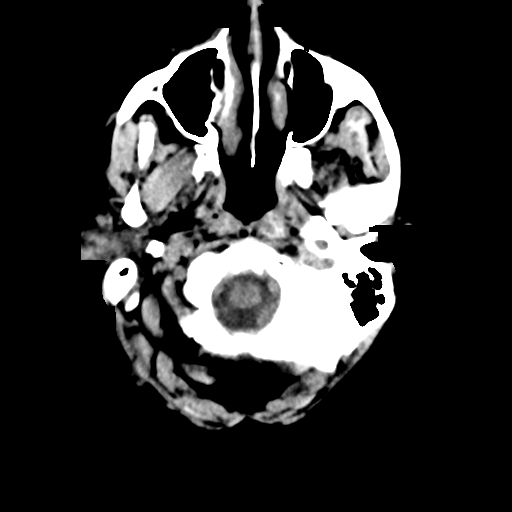
[im 3/32  bone]
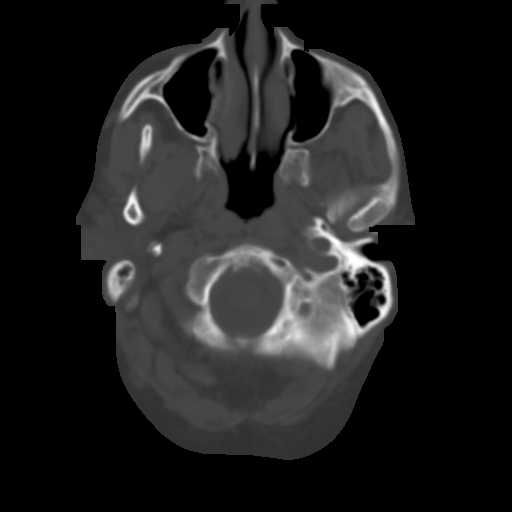
[im 6/32  brain]
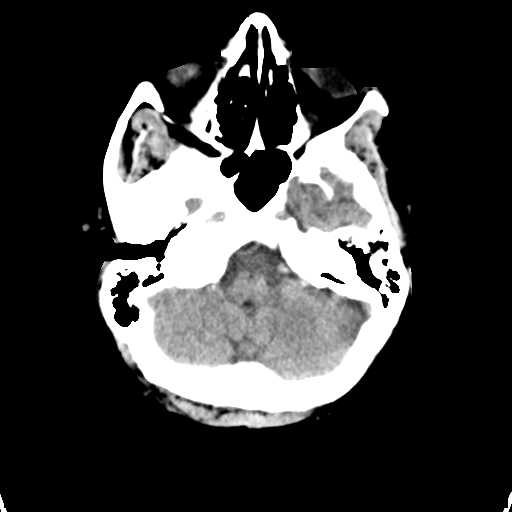
[im 9/32  brain]
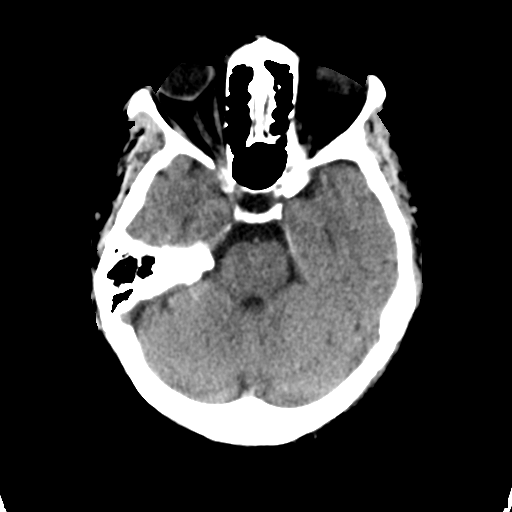
[im 12/32  brain]
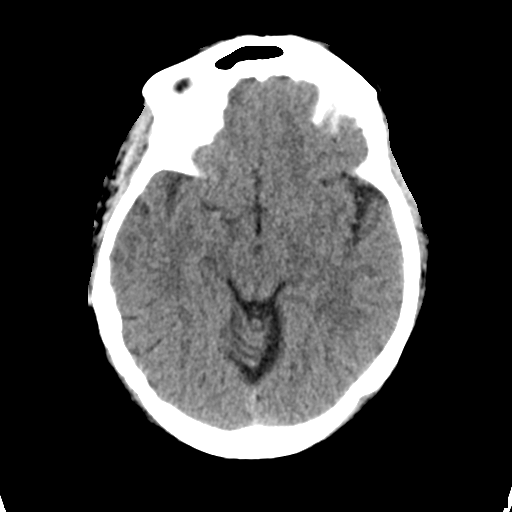
[im 17/32  brain]
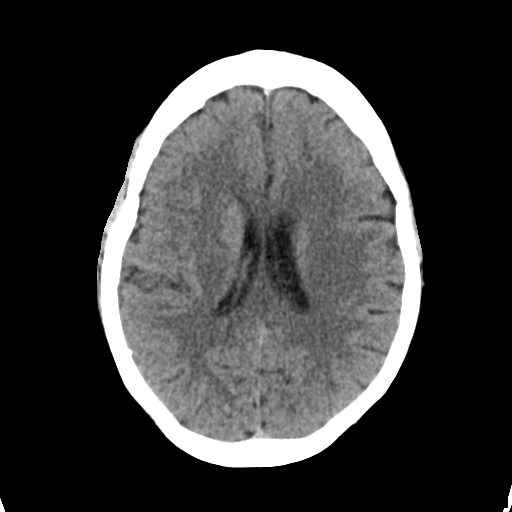
[im 17/32  bone]
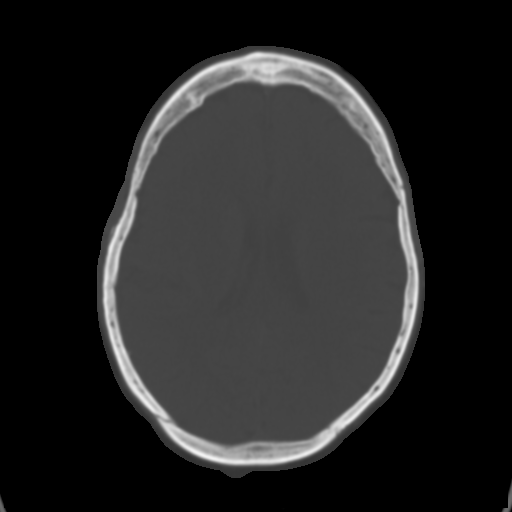
[im 20/32  brain]
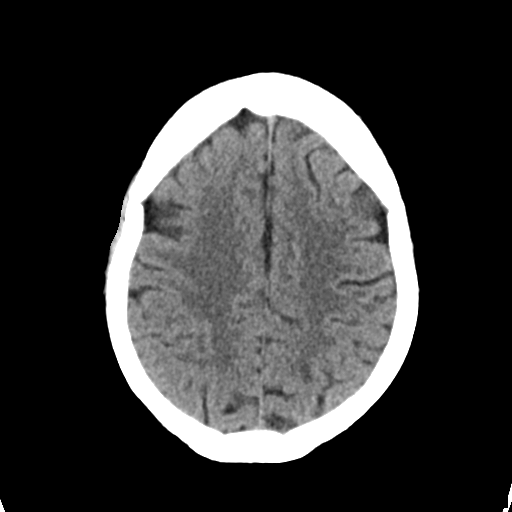
[im 23/32  brain]
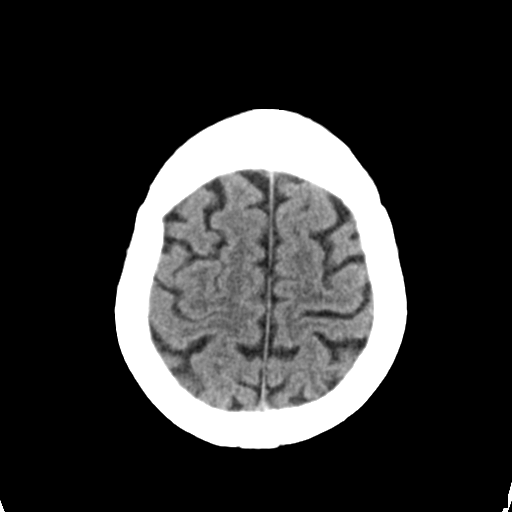
[im 26/32  brain]
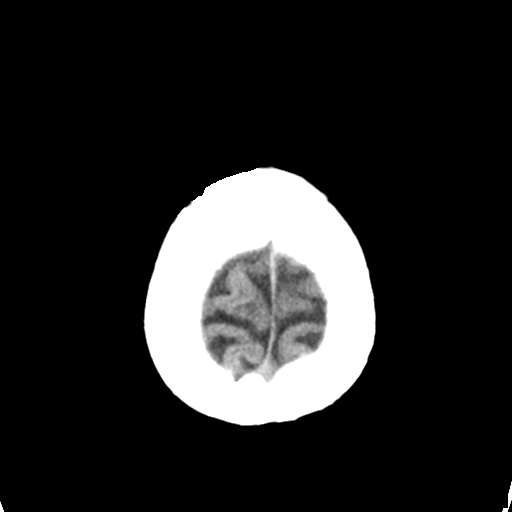
[im 29/32  brain]
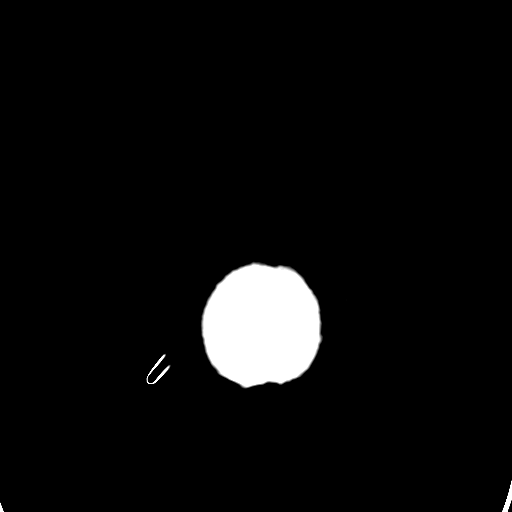
[im 29/32  bone]
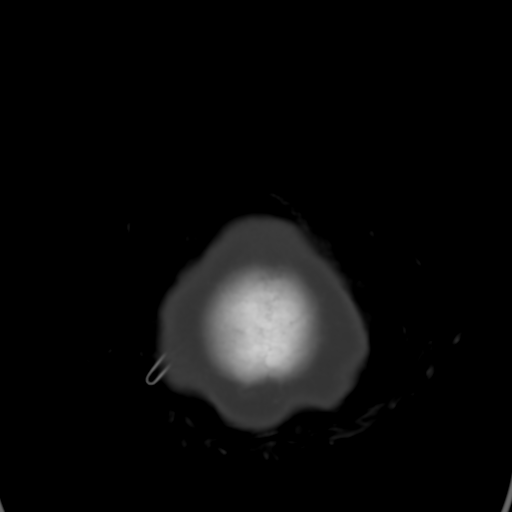

[Series 5: head 3.0 mpr cor · coronal · 0.31mm/px · 3 of 67 slices shown]
[im 23/67  brain]
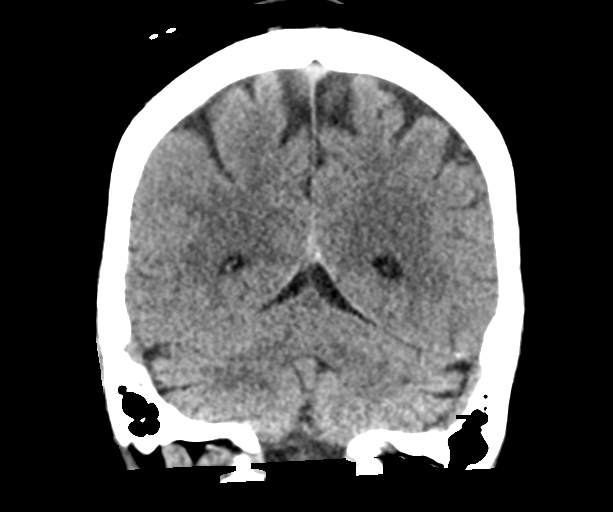
[im 30/67  brain]
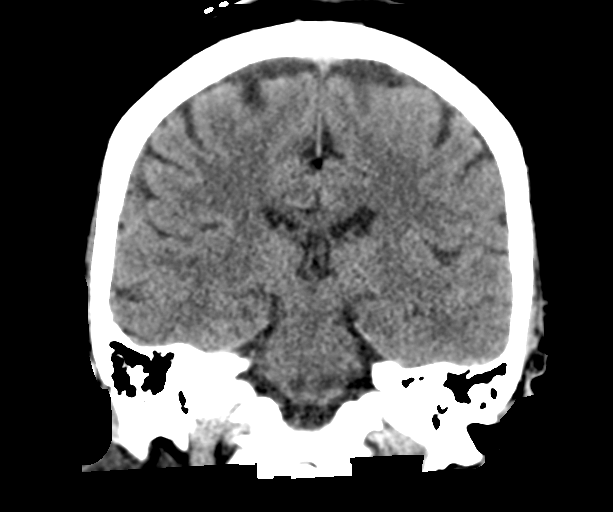
[im 37/67  brain]
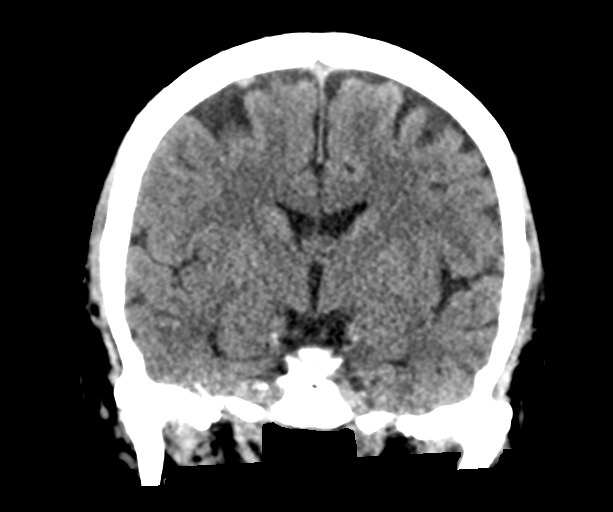

[Series 6: head 3.0 mpr sag · sagittal · 0.30mm/px · 3 of 58 slices shown]
[im 20/58  brain]
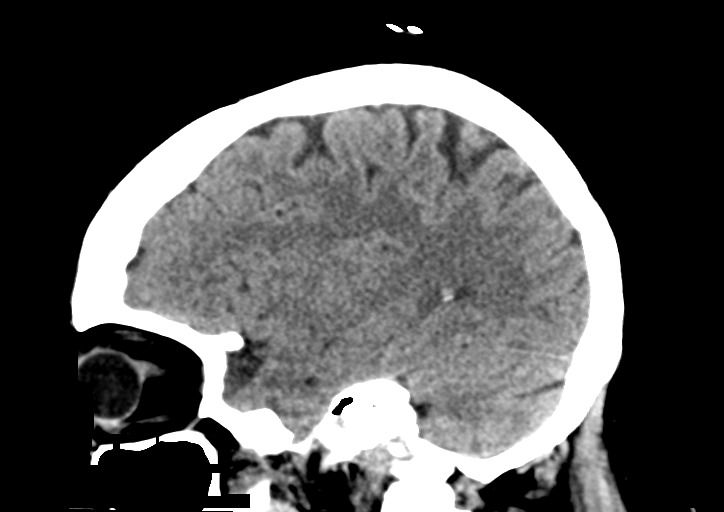
[im 29/58  brain]
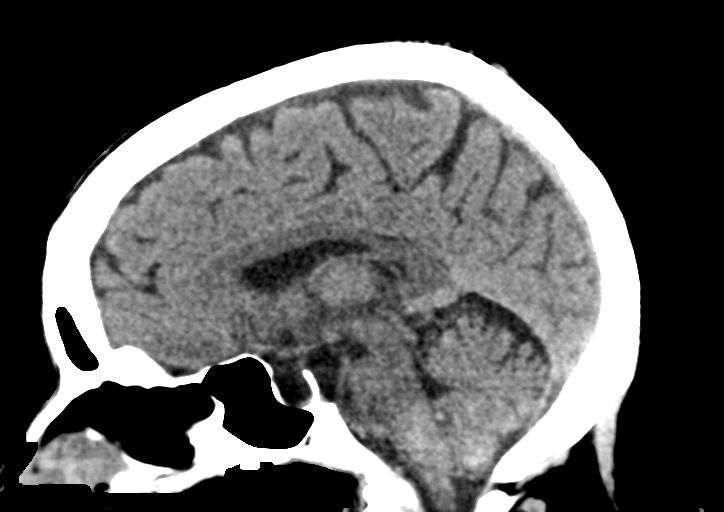
[im 39/58  brain]
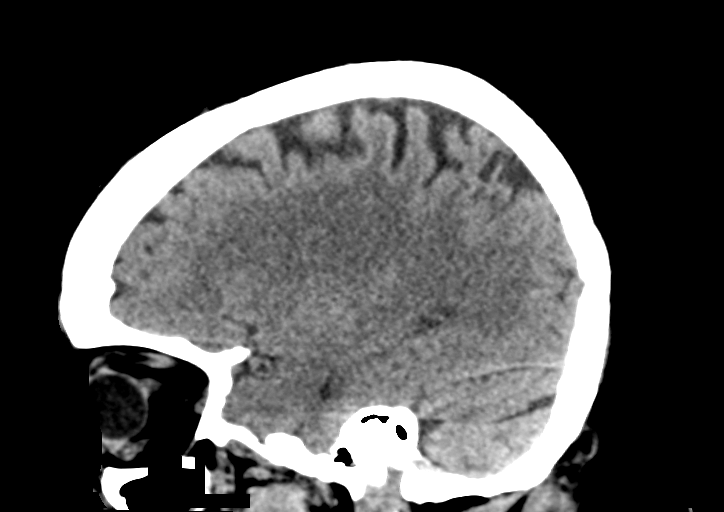

[15 of 47 positions shown; findings below may reference images not displayed]

FINDINGS: Brain: No mass lesion, intraparenchymal hemorrhage or extra-axial
collection. No evidence of acute cortical infarct. Brain parenchyma
and CSF-containing spaces are normal for age.

Vascular: No hyperdense vessel or unexpected calcification.

Skull: Normal visualized skull base, calvarium and extracranial soft
tissues.

Sinuses/Orbits: No sinus fluid levels or advanced mucosal
thickening. No mastoid effusion. Normal orbits.

ASPECTS (Alberta Stroke Program Early CT Score)

- Ganglionic level infarction (caudate, lentiform nuclei, internal
capsule, insula, M1-M3 cortex): 7

- Supraganglionic infarction (M4-M6 cortex): 3

Total score (0-10 with 10 being normal): 10
IMPRESSION: 1. Normal head CT.
2. ASPECTS is 10.
These results were called by telephone at the time of interpretation
on 05/12/2016 at [DATE] to Dr. Epitacio, who verbally acknowledged
these results.

## 2018-06-12 ENCOUNTER — Ambulatory Visit: Payer: Medicare Other

## 2018-06-12 DIAGNOSIS — I5022 Chronic systolic (congestive) heart failure: Secondary | ICD-10-CM

## 2018-06-12 DIAGNOSIS — Z95810 Presence of automatic (implantable) cardiac defibrillator: Secondary | ICD-10-CM

## 2018-06-13 NOTE — Progress Notes (Signed)
EPIC Encounter for ICM Monitoring  Patient Name: Shelley Douglas is a 68 y.o. female Date: 06/13/2018 Primary Care Physican: Elwyn Reach, MD Primary [place] Electrophysiologist: Allred Bi-V Pacing: 99.3% Last knownWeight: 237 lbs   Transmission reviewed.   Thoracic impedance normal.  Prescribed:Furosemide 40 mg 1 tabletevery Monday, Wednesday and Friday.  Labs: 12/26/2017 Creatinine 1.02, BUN 18, Potassium 4.5, Sodium 140, GFR 57-66 12/26/2016 Creatinine 1.09, BUN 19, Potassium 4.3, Sodium 139, GFR 52-60 11/10/2016 Creatinine1.12, BUN21, Potassium4.3, Sodium135, GFR50-58 11/09/2016 Creatinine1.20, BUN20, Potassium3.9, Sodium135, [id number]  Recommendations: None  Follow-up plan: ICM clinic phone appointment on 07/17/2018.  Copy of ICM check sent to Dr.Allred.   3 month ICM trend: 06/12/2018    1 Year ICM trend:       Rosalene Billings, RN 06/13/2018 1:00 PM

## 2018-07-05 ENCOUNTER — Ambulatory Visit (INDEPENDENT_AMBULATORY_CARE_PROVIDER_SITE_OTHER): Payer: Medicare Other | Admitting: *Deleted

## 2018-07-05 ENCOUNTER — Telehealth: Payer: Self-pay

## 2018-07-05 DIAGNOSIS — I255 Ischemic cardiomyopathy: Secondary | ICD-10-CM | POA: Diagnosis not present

## 2018-07-05 NOTE — Telephone Encounter (Signed)
Unable to speak  with patient to remind of missed remote transmission 

## 2018-07-06 LAB — CUP PACEART REMOTE DEVICE CHECK
Battery Remaining Longevity: 47 mo
Battery Voltage: 2.95 V
Brady Statistic AP VP Percent: 0.76 %
Brady Statistic AP VS Percent: 0.01 %
Brady Statistic AS VP Percent: 99.12 %
Brady Statistic AS VS Percent: 0.11 %
Brady Statistic RA Percent Paced: 0.77 %
Brady Statistic RV Percent Paced: 99.21 %
Date Time Interrogation Session: 20200702071924
HighPow Impedance: 44 Ohm
HighPow Impedance: 50 Ohm
Implantable Lead Implant Date: 20090720
Implantable Lead Implant Date: 20090720
Implantable Lead Implant Date: 20170627
Implantable Lead Location: 753858
Implantable Lead Location: 753859
Implantable Lead Location: 753860
Implantable Lead Model: 4598
Implantable Lead Model: 5076
Implantable Lead Model: 6947
Implantable Pulse Generator Implant Date: 20170627
Lead Channel Impedance Value: 1045 Ohm
Lead Channel Impedance Value: 1083 Ohm
Lead Channel Impedance Value: 323 Ohm
Lead Channel Impedance Value: 380 Ohm
Lead Channel Impedance Value: 437 Ohm
Lead Channel Impedance Value: 437 Ohm
Lead Channel Impedance Value: 494 Ohm
Lead Channel Impedance Value: 513 Ohm
Lead Channel Impedance Value: 589 Ohm
Lead Channel Impedance Value: 646 Ohm
Lead Channel Impedance Value: 703 Ohm
Lead Channel Impedance Value: 703 Ohm
Lead Channel Impedance Value: 950 Ohm
Lead Channel Pacing Threshold Amplitude: 0.5 V
Lead Channel Pacing Threshold Amplitude: 0.75 V
Lead Channel Pacing Threshold Amplitude: 1.625 V
Lead Channel Pacing Threshold Pulse Width: 0.4 ms
Lead Channel Pacing Threshold Pulse Width: 0.4 ms
Lead Channel Pacing Threshold Pulse Width: 0.4 ms
Lead Channel Sensing Intrinsic Amplitude: 4.75 mV
Lead Channel Sensing Intrinsic Amplitude: 6.375 mV
Lead Channel Setting Pacing Amplitude: 1.5 V
Lead Channel Setting Pacing Amplitude: 2 V
Lead Channel Setting Pacing Amplitude: 2.75 V
Lead Channel Setting Pacing Pulse Width: 0.4 ms
Lead Channel Setting Pacing Pulse Width: 0.4 ms
Lead Channel Setting Sensing Sensitivity: 0.3 mV

## 2018-07-14 ENCOUNTER — Encounter: Payer: Self-pay | Admitting: Cardiology

## 2018-07-14 NOTE — Progress Notes (Signed)
Remote ICD transmission.   

## 2018-07-17 ENCOUNTER — Ambulatory Visit (INDEPENDENT_AMBULATORY_CARE_PROVIDER_SITE_OTHER): Payer: Medicare Other

## 2018-07-17 DIAGNOSIS — Z95810 Presence of automatic (implantable) cardiac defibrillator: Secondary | ICD-10-CM | POA: Diagnosis not present

## 2018-07-17 DIAGNOSIS — I5022 Chronic systolic (congestive) heart failure: Secondary | ICD-10-CM

## 2018-07-19 ENCOUNTER — Telehealth: Payer: Self-pay

## 2018-07-19 NOTE — Telephone Encounter (Signed)
Remote ICM transmission received.  Attempted call to patient regarding ICM remote transmission and recording stated the person you are trying to reach is not accepting calls at this time.

## 2018-07-19 NOTE — Progress Notes (Signed)
EPIC Encounter for ICM Monitoring  Patient Name: Shelley Douglas is a 68 y.o. female Date: 07/19/2018 Primary Care Physican: Elwyn Reach, MD Primary [place] Electrophysiologist: Allred Bi-V Pacing: 99.4% Last knownWeight: 237 lbs   Attempted call to patient and unable to reach.   Transmission reviewed.   Optivol Thoracic impedance normal.  Prescribed:Furosemide 40 mg 1 tabletevery Monday, Wednesday and Friday.  Labs: 12/26/2017 Creatinine 1.02, BUN 18, Potassium 4.5, Sodium 140, GFR 57-66 12/26/2016 Creatinine 1.09, BUN 19, Potassium 4.3, Sodium 139, GFR 52-60 11/10/2016 Creatinine1.12, BUN21, Potassium4.3, Sodium135, GFR50-58 11/09/2016 Creatinine1.20, BUN20, Potassium3.9, Sodium135, [id number]  Recommendations: Unable to reach.    Follow-up plan: ICM clinic phone appointment on8/17/2020.  Copy of ICM check sent to Dr.Allred.   3 month ICM trend: 07/17/2018    1 Year ICM trend:       Rosalene Billings, RN 07/19/2018 2:21 PM

## 2018-08-21 ENCOUNTER — Ambulatory Visit (INDEPENDENT_AMBULATORY_CARE_PROVIDER_SITE_OTHER): Payer: Medicare Other

## 2018-08-21 DIAGNOSIS — Z95810 Presence of automatic (implantable) cardiac defibrillator: Secondary | ICD-10-CM

## 2018-08-21 DIAGNOSIS — I5022 Chronic systolic (congestive) heart failure: Secondary | ICD-10-CM | POA: Diagnosis not present

## 2018-08-23 ENCOUNTER — Telehealth: Payer: Self-pay

## 2018-08-23 NOTE — Telephone Encounter (Signed)
Remote ICM transmission received.  Attempted call to patient regarding ICM remote transmission and number listed is invalid.

## 2018-08-23 NOTE — Progress Notes (Signed)
EPIC Encounter for ICM Monitoring  Patient Name: Shelley Douglas is a 68 y.o. female Date: 08/23/2018 Primary Care Physican: Elwyn Reach, MD Primary [place] Electrophysiologist: Allred Bi-V Pacing: 99.3% Last knownWeight: 237 lbs   Transmission reviewed.   Optivol Thoracic impedance suggesting possible fluid accumulation since 08/13/2018 but returning to normal.  Prescribed:Furosemide 40 mg 1 tabletevery Monday, Wednesday and Friday.  Labs: 12/26/2017 Creatinine 1.02, BUN 18, Potassium 4.5, Sodium 140, GFR 57-66 12/26/2016 Creatinine 1.09, BUN 19, Potassium 4.3, Sodium 139, GFR 52-60 11/10/2016 Creatinine1.12, BUN21, Potassium4.3, Sodium135, GFR50-58 11/09/2016 Creatinine1.20, BUN20, Potassium3.9, Sodium135, [id number]  Recommendations: None   Follow-up plan: ICM clinic phone appointment on10/19/2020.  Copy of ICM check sent to Dr.Allred.  3 month ICM trend: 08/21/2018    1 Year ICM trend:       Rosalene Billings, RN 08/23/2018 2:28 PM

## 2018-10-04 ENCOUNTER — Ambulatory Visit (INDEPENDENT_AMBULATORY_CARE_PROVIDER_SITE_OTHER): Payer: Medicare Other | Admitting: *Deleted

## 2018-10-04 DIAGNOSIS — I255 Ischemic cardiomyopathy: Secondary | ICD-10-CM | POA: Diagnosis not present

## 2018-10-07 LAB — CUP PACEART REMOTE DEVICE CHECK
Battery Remaining Longevity: 42 mo
Battery Voltage: 2.95 V
Brady Statistic AP VP Percent: 0.5 %
Brady Statistic AP VS Percent: 0.01 %
Brady Statistic AS VP Percent: 99.4 %
Brady Statistic AS VS Percent: 0.09 %
Brady Statistic RA Percent Paced: 0.51 %
Brady Statistic RV Percent Paced: 99.5 %
Date Time Interrogation Session: 20201001193426
HighPow Impedance: 48 Ohm
HighPow Impedance: 58 Ohm
Implantable Lead Implant Date: 20090720
Implantable Lead Implant Date: 20090720
Implantable Lead Implant Date: 20170627
Implantable Lead Location: 753858
Implantable Lead Location: 753859
Implantable Lead Location: 753860
Implantable Lead Model: 4598
Implantable Lead Model: 5076
Implantable Lead Model: 6947
Implantable Pulse Generator Implant Date: 20170627
Lead Channel Impedance Value: 1102 Ohm
Lead Channel Impedance Value: 1273 Ohm
Lead Channel Impedance Value: 1273 Ohm
Lead Channel Impedance Value: 342 Ohm
Lead Channel Impedance Value: 399 Ohm
Lead Channel Impedance Value: 456 Ohm
Lead Channel Impedance Value: 513 Ohm
Lead Channel Impedance Value: 570 Ohm
Lead Channel Impedance Value: 589 Ohm
Lead Channel Impedance Value: 722 Ohm
Lead Channel Impedance Value: 817 Ohm
Lead Channel Impedance Value: 836 Ohm
Lead Channel Impedance Value: 836 Ohm
Lead Channel Pacing Threshold Amplitude: 0.5 V
Lead Channel Pacing Threshold Amplitude: 0.75 V
Lead Channel Pacing Threshold Amplitude: 1.5 V
Lead Channel Pacing Threshold Pulse Width: 0.4 ms
Lead Channel Pacing Threshold Pulse Width: 0.4 ms
Lead Channel Pacing Threshold Pulse Width: 0.4 ms
Lead Channel Sensing Intrinsic Amplitude: 4.5 mV
Lead Channel Sensing Intrinsic Amplitude: 4.5 mV
Lead Channel Sensing Intrinsic Amplitude: 8.125 mV
Lead Channel Sensing Intrinsic Amplitude: 8.125 mV
Lead Channel Setting Pacing Amplitude: 1.5 V
Lead Channel Setting Pacing Amplitude: 2 V
Lead Channel Setting Pacing Amplitude: 2.5 V
Lead Channel Setting Pacing Pulse Width: 0.4 ms
Lead Channel Setting Pacing Pulse Width: 0.4 ms
Lead Channel Setting Sensing Sensitivity: 0.3 mV

## 2018-10-10 ENCOUNTER — Encounter: Payer: Self-pay | Admitting: Cardiology

## 2018-10-10 NOTE — Progress Notes (Signed)
Remote ICD transmission.   

## 2018-10-23 ENCOUNTER — Ambulatory Visit (INDEPENDENT_AMBULATORY_CARE_PROVIDER_SITE_OTHER): Payer: Medicare Other

## 2018-10-23 DIAGNOSIS — I5022 Chronic systolic (congestive) heart failure: Secondary | ICD-10-CM

## 2018-10-23 DIAGNOSIS — Z95810 Presence of automatic (implantable) cardiac defibrillator: Secondary | ICD-10-CM

## 2018-10-24 NOTE — Progress Notes (Signed)
EPIC Encounter for ICM Monitoring  Patient Name: Shelley Douglas is a 68 y.o. female Date: 10/24/2018 Primary Care Physican: Elwyn Reach, MD Primary [place] Electrophysiologist: Allred Bi-V Pacing: 99.3% Last knownWeight: 237 lbs   Transmission reviewed.   OptivolThoracic impedance normal.  Prescribed:Furosemide 40 mg 1 tabletevery Monday, Wednesday and Friday.  Labs: 12/26/2017 Creatinine 1.02, BUN 18, Potassium 4.5, Sodium 140, GFR 57-66 12/26/2016 Creatinine 1.09, BUN 19, Potassium 4.3, Sodium 139, GFR 52-60 11/10/2016 Creatinine1.12, BUN21, Potassium4.3, Sodium135, GFR50-58 11/09/2016 Creatinine1.20, BUN20, Potassium3.9, Sodium135, [id number]  Recommendations: None  Follow-up plan: ICM clinic phone appointment on11/30/2020.  Device clinic 91 day remote transmission 01/03/2019.  Copy of ICM check sent to Dr.Allred.  3 month ICM trend: 10/23/2018    1 Year ICM trend:       Rosalene Billings, RN 10/24/2018 1:29 PM

## 2018-12-05 ENCOUNTER — Telehealth: Payer: Self-pay

## 2018-12-05 NOTE — Telephone Encounter (Signed)
Unable to speak  with patient to remind of missed remote transmission 

## 2018-12-11 NOTE — Progress Notes (Signed)
No ICM remote transmission received for 12/04/2018 and next ICM transmission scheduled for 01/15/2019.   

## 2018-12-26 ENCOUNTER — Telehealth: Payer: Self-pay

## 2018-12-26 NOTE — Telephone Encounter (Signed)
I am unable to reach her as her Home number is wrong and I have left messages for daughter Conception Oms

## 2018-12-26 NOTE — Telephone Encounter (Signed)
-----   Message from Adrian Prows, MD sent at 12/03/2018  8:28 AM EST ----- Regarding: Primary Cardiologist She may be following with Dr. Doylene Canard, for Cardiology. I am fine if she is. I am not sure who is following her ICD. Can you please update. She needs to f/u with [facility] or [place] and I am not sure Doylene Canard does ICD f/u.  JG

## 2019-01-16 ENCOUNTER — Telehealth: Payer: Self-pay

## 2019-01-16 NOTE — Telephone Encounter (Signed)
Left message for patient to remind of missed remote transmission.  

## 2019-01-29 NOTE — Progress Notes (Signed)
No ICM remote transmission received for 01/15/2019 and next ICM transmission scheduled for 02/12/2019.

## 2019-01-31 ENCOUNTER — Telehealth: Payer: Self-pay

## 2019-01-31 NOTE — Telephone Encounter (Signed)
Pt 3 yr recall letter has been mailed to patients home address on file tt

## 2019-02-12 NOTE — Progress Notes (Signed)
No ICM remote transmission received for 02/12/2019.  No remote transmissions received since 10/23/2018 and unable to reach patient for participation in [facility] clinic.  Per protocol device clinic will continue 91 day remote transmission checks.  No further ICM calls and patient unenrolled in monthly follow up.

## 2019-03-01 ENCOUNTER — Ambulatory Visit: Payer: Medicare Other | Attending: Internal Medicine

## 2019-03-01 DIAGNOSIS — Z23 Encounter for immunization: Secondary | ICD-10-CM

## 2019-03-01 NOTE — Progress Notes (Signed)
   Covid-19 Vaccination Clinic  Name:  Shelley Douglas    MRN: [id number] DOB: Aug 07, 1950  03/01/2019  Shelley Douglas was observed post Covid-19 immunization for 30 minutes based on pre-vaccination screening without incidence. She was provided with Vaccine Information Sheet and instruction to access the V-Safe system.   Shelley Douglas was instructed to call 911 with any severe reactions post vaccine: Marland Kitchen Difficulty breathing  . Swelling of your face and throat  . A fast heartbeat  . A bad rash all over your body  . Dizziness and weakness    Immunizations Administered    Name Date Dose VIS Date Route   Pfizer COVID-19 Vaccine 03/01/2019 10:23 AM 0.3 mL 12/15/2018 Intramuscular   Manufacturer: [place]   Lot: [id number]   [place]: [id number]

## 2019-03-28 ENCOUNTER — Ambulatory Visit: Payer: Medicare HMO | Attending: Internal Medicine

## 2019-03-28 DIAGNOSIS — Z23 Encounter for immunization: Secondary | ICD-10-CM

## 2019-03-28 NOTE — Progress Notes (Signed)
   Covid-19 Vaccination Clinic  Name:  KENNETRA ANAGNOSTOPOULOS    MRN: [id number] DOB: [birth year]  03/28/2019  Ms. Reiley was observed post Covid-19 immunization for 15 minutes without incident. She was provided with Vaccine Information Sheet and instruction to access the V-Safe system.   Ms. Mekonnen was instructed to call 911 with any severe reactions post vaccine: Marland Kitchen Difficulty breathing  . Swelling of face and throat  . A fast heartbeat  . A bad rash all over body  . Dizziness and weakness   Immunizations Administered    Name Date Dose VIS Date Route   Pfizer COVID-19 Vaccine 03/28/2019  8:34 AM 0.3 mL 12/15/2018 Intramuscular   Manufacturer: [place]   Lot: [id number]   [place]: [id number]

## 2019-08-06 ENCOUNTER — Other Ambulatory Visit: Payer: Self-pay | Admitting: Student

## 2019-08-06 DIAGNOSIS — Z1231 Encounter for screening mammogram for malignant neoplasm of breast: Secondary | ICD-10-CM

## 2019-12-17 ENCOUNTER — Ambulatory Visit: Payer: Medicare Other | Attending: Internal Medicine

## 2019-12-17 DIAGNOSIS — Z23 Encounter for immunization: Secondary | ICD-10-CM

## 2019-12-17 NOTE — Progress Notes (Signed)
   Covid-19 Vaccination Clinic  Name:  VEGAS COFFIN    MRN: [id number] DOB: [birth year]  12/17/2019  Ms. Griffiths was observed post Covid-19 immunization for 15 minutes without incident. She was provided with Vaccine Information Sheet and instruction to access the V-Safe system.   Ms. Spera was instructed to call 911 with any severe reactions post vaccine: Marland Kitchen Difficulty breathing  . Swelling of face and throat  . A fast heartbeat  . A bad rash all over body  . Dizziness and weakness   Immunizations Administered    Name Date Dose VIS Date Route   Pfizer COVID-19 Vaccine 12/17/2019  2:48 PM 0.3 mL 10/24/2019 Intramuscular   Manufacturer: [place]   Lot: [id number]   [place]: [id number]

## 2020-01-24 ENCOUNTER — Other Ambulatory Visit: Payer: Self-pay | Admitting: Cardiovascular Disease

## 2020-01-24 ENCOUNTER — Other Ambulatory Visit: Payer: Self-pay

## 2020-01-24 ENCOUNTER — Ambulatory Visit
Admission: RE | Admit: 2020-01-24 | Discharge: 2020-01-24 | Disposition: A | Discharge status: Home / Self Care | Payer: Medicare HMO | Source: Ambulatory Visit | Attending: Cardiovascular Disease | Admitting: Cardiovascular Disease

## 2020-01-24 DIAGNOSIS — M5440 Lumbago with sciatica, unspecified side: Secondary | ICD-10-CM

## 2020-04-01 ENCOUNTER — Other Ambulatory Visit: Payer: Self-pay | Admitting: Cardiovascular Disease

## 2020-04-01 DIAGNOSIS — Z1231 Encounter for screening mammogram for malignant neoplasm of breast: Secondary | ICD-10-CM

## 2020-06-24 LAB — COLOGUARD: COLOGUARD: POSITIVE — AB

## 2020-10-11 ENCOUNTER — Emergency Department (HOSPITAL_COMMUNITY): Payer: Medicare Other

## 2020-10-11 ENCOUNTER — Emergency Department (HOSPITAL_COMMUNITY)
Admission: EM | Admit: 2020-10-11 | Discharge: 2020-10-11 | Disposition: A | Discharge status: Home / Self Care | Payer: Medicare Other | Attending: Emergency Medicine | Admitting: Emergency Medicine

## 2020-10-11 ENCOUNTER — Other Ambulatory Visit: Payer: Self-pay

## 2020-10-11 ENCOUNTER — Encounter (HOSPITAL_COMMUNITY): Payer: Self-pay | Admitting: Emergency Medicine

## 2020-10-11 DIAGNOSIS — R0789 Other chest pain: Secondary | ICD-10-CM | POA: Insufficient documentation

## 2020-10-11 DIAGNOSIS — Z87891 Personal history of nicotine dependence: Secondary | ICD-10-CM | POA: Insufficient documentation

## 2020-10-11 DIAGNOSIS — I5023 Acute on chronic systolic (congestive) heart failure: Secondary | ICD-10-CM | POA: Insufficient documentation

## 2020-10-11 DIAGNOSIS — I2510 Atherosclerotic heart disease of native coronary artery without angina pectoris: Secondary | ICD-10-CM | POA: Diagnosis not present

## 2020-10-11 DIAGNOSIS — Z79899 Other long term (current) drug therapy: Secondary | ICD-10-CM | POA: Insufficient documentation

## 2020-10-11 DIAGNOSIS — E119 Type 2 diabetes mellitus without complications: Secondary | ICD-10-CM | POA: Insufficient documentation

## 2020-10-11 DIAGNOSIS — I110 Hypertensive heart disease with heart failure: Secondary | ICD-10-CM | POA: Insufficient documentation

## 2020-10-11 DIAGNOSIS — R059 Cough, unspecified: Secondary | ICD-10-CM | POA: Insufficient documentation

## 2020-10-11 DIAGNOSIS — Z7901 Long term (current) use of anticoagulants: Secondary | ICD-10-CM | POA: Insufficient documentation

## 2020-10-11 DIAGNOSIS — R079 Chest pain, unspecified: Secondary | ICD-10-CM

## 2020-10-11 DIAGNOSIS — R0602 Shortness of breath: Secondary | ICD-10-CM | POA: Insufficient documentation

## 2020-10-11 DIAGNOSIS — R051 Acute cough: Secondary | ICD-10-CM

## 2020-10-11 DIAGNOSIS — Z7984 Long term (current) use of oral hypoglycemic drugs: Secondary | ICD-10-CM | POA: Diagnosis not present

## 2020-10-11 LAB — COMPREHENSIVE METABOLIC PANEL
ALT: 11 U/L (ref 0–44)
AST: 19 U/L (ref 15–41)
Albumin: 3.3 g/dL — ABNORMAL LOW (ref 3.5–5.0)
Alkaline Phosphatase: 83 U/L (ref 38–126)
Anion gap: 7 (ref 5–15)
BUN: 10 mg/dL (ref 8–23)
CO2: 28 mmol/L (ref 22–32)
Calcium: 10.1 mg/dL (ref 8.9–10.3)
Chloride: 104 mmol/L (ref 98–111)
Creatinine, Ser: 0.97 mg/dL (ref 0.44–1.00)
GFR, Estimated: >60 mL/min (ref >60)
Glucose, Bld: 180 mg/dL — ABNORMAL HIGH (ref 70–99)
Potassium: 3.7 mmol/L (ref 3.5–5.1)
Sodium: 139 mmol/L (ref 135–145)
Total Bilirubin: 0.4 mg/dL (ref 0.3–1.2)
Total Protein: 7.5 g/dL (ref 6.5–8.1)

## 2020-10-11 LAB — CBC
HCT: 37.4 % (ref 36.0–46.0)
Hemoglobin: 12 g/dL (ref 12.0–15.0)
MCH: 28.5 pg (ref 26.0–34.0)
MCHC: 32.1 g/dL (ref 30.0–36.0)
MCV: 88.8 fL (ref 80.0–100.0)
Platelets: 244 10*3/uL (ref 150–400)
RBC: 4.21 MIL/uL (ref 3.87–5.11)
RDW: 14.1 % (ref 11.5–15.5)
WBC: 3.9 10*3/uL — ABNORMAL LOW (ref 4.0–10.5)
nRBC: 0 % (ref 0.0–0.2)

## 2020-10-11 LAB — PROTIME-INR
INR: 0.9 (ref 0.8–1.2)
Prothrombin Time: 12.6 seconds (ref 11.4–15.2)

## 2020-10-11 LAB — TROPONIN I (HIGH SENSITIVITY): Troponin I (High Sensitivity): 12 ng/L (ref <18)

## 2020-10-11 LAB — BRAIN NATRIURETIC PEPTIDE: B Natriuretic Peptide: 202.6 pg/mL — ABNORMAL HIGH (ref 0.0–100.0)

## 2020-10-11 MED ORDER — BENZONATATE 200 MG PO CAPS: 200.0000 mg | ORAL_CAPSULE | Freq: Three times a day (TID) | ORAL | 0 refills | Status: AC

## 2020-10-11 NOTE — ED Notes (Signed)
Discharge instructions reviewed with patient and patient's husband . Patient and husband verbalized understanding of instructions. Follow-up care and medications were reviewed. Patient wheeled out of ED in wheelchair. VSS upon discharge.

## 2020-10-11 NOTE — Discharge Instructions (Addendum)
Take Tessalon as needed as prescribed for cough. Follow up with your doctor for recheck. Return to the ER for new or worsening symptoms.

## 2020-10-11 NOTE — ED Provider Notes (Signed)
[facility] EMERGENCY DEPARTMENT Provider Note   CSN: [id number] Arrival date & time: 10/11/20  4166     History Chief Complaint  Patient presents with  . Chest Pain    Shelley Douglas is a 70 y.o. female.  70 year old female with history of HTN, PE (on Eliquis), MD, CAD, AICD, CHF, additional history as listed below presents with complaint of a sharp pain on the left side of her chest which occurs only with cough.  Patient states that 1 week ago she was out in the rain when she caught a cough.  States that for the past week when she coughs she has a sharp pain in her left lower chest.  This resolves as soon as she is done coughing.  Reports that eating also causes her to cough which then results in the pain.  Reports pain radiates up and down her left arm, feels little short of breath through a coughing episode however when cough has resolved and at baseline she is not short of breath.  Denies nausea, diaphoresis or abdominal pain.  No other complaints or concerns.      Past Medical History:  Diagnosis Date  . AICD (automatic cardioverter/defibrillator) present   . Anxiety    pt denies this hx on 05/12/2016  . CAD (coronary artery disease)    last cath 2016  . Daily headache   . DVT (deep venous thrombosis) ([place]) 1996  . High cholesterol   . History of blood transfusion    "related to pregnancy" (05/12/2016)  . Hypertension    "just started taking RX" (05/12/2016)  . Ischemic cardiomyopathy   . Left bundle branch block   . Mitral valve regurgitation   . Myocardial infarction [facility]) ~ 2009 X2  . Obesity   . Persistent atrial fibrillation ([place])   . Pulmonary embolism ([place]) 1993  . Seizures ([place]) 1970s   "I had 1; don't know what it was from" (05/12/2016)  . Stroke [facility]) 2001   denies residual on 05/12/2016  . Type II diabetes mellitus [facility])     Patient Active Problem List   Diagnosis Date Noted  . CHF (congestive heart failure) ([place]) 05/13/2016  . Acute on  chronic left systolic heart failure ([place]) 05/12/2016  . Chronic systolic dysfunction of left ventricle 07/01/2015  . ACS (acute coronary syndrome) ([place]) 12/07/2014  . Acute left systolic heart failure ([place]) 09/27/2014  . Dizziness 09/24/2014  . Routine gynecological examination 02/07/2012  . Ischemic cardiomyopathy 08/07/2010  . 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 PULMONARY EMBOLISM 01/29/2010  . VENTRICULAR TACHYCARDIA 01/29/2010  . Atrial fibrillation ([place]) 01/29/2010  . Chronic systolic congestive heart failure ([place]) 01/29/2010  . Cerebral artery occlusion with cerebral infarction ([place]) 01/29/2010  . IMPLANTATION OF DEFIBRILLATOR, HX OF 01/29/2010    Past Surgical History:  Procedure Laterality Date  . BILATERAL CARPAL TUNNEL RELEASE Bilateral 1990s  . CARDIAC CATHETERIZATION N/A 09/27/2014   Procedure: Left Heart Cath and Coronary Angiography;  Surgeon: Dixie Dials, MD;  Location: [facility] CV LAB;  Service: Cardiovascular;  Laterality: N/A;  . CARDIAC DEFIBRILLATOR PLACEMENT  07/24/2007   Medtronic [address]ura DR implanted in [place] for primary prevention  . CORONARY ANGIOPLASTY WITH STENT PLACEMENT  ~ 2009  . CORONARY ARTERY BYPASS GRAFT  ~ 2009  . DILATION AND CURETTAGE OF UTERUS    . EP IMPLANTABLE DEVICE N/A 07/01/2015   BiV ICD upgrade to [employer] XT CRT-D device  for primary prevention  . EXTERNAL EAR SURGERY Right 1990   "skin grafted from my right thigh and put on my right ear"  . HERNIA REPAIR  1990's    "stomach"  . TUBAL LIGATION    . VENA CAVA FILTER PLACEMENT  1993     OB History     Gravida  4   Para  4   Term  4   Preterm  0   AB  0   Living  4      SAB  0   IAB  0   Ectopic  0   Multiple  0   Live Births              Family History  Problem Relation Age of Onset  . Hypertension Unknown     Social History   Tobacco Use  . Smoking status: Former    Years: 4.00    Types:  Cigarettes    Quit date: 01/05/1988    Years since quitting: 32.7  . Smokeless tobacco: Never  . Tobacco comments:    05/12/2016 "someday smoker when I did smoked; never smoked much"  Vaping Use  . Vaping Use: Never used  Substance Use Topics  . Alcohol use: No  . Drug use: No    Home Medications Prior to Admission medications   Medication Sig Start Date End Date Taking? Authorizing Provider  acetaminophen (TYLENOL) 650 MG CR tablet Take 650 mg by mouth every 8 (eight) hours as needed for pain.   Yes [provider]  amLODipine (NORVASC) 5 MG tablet Take 5 mg by mouth daily.   Yes [provider]  apixaban (ELIQUIS) 5 MG TABS tablet Take 1 tablet (5 mg total) by mouth 2 (two) times daily. 05/14/16  Yes Dixie Dials, MD  atorvastatin (LIPITOR) 40 MG tablet Take 40 mg by mouth daily.   Yes [provider]  benzonatate (TESSALON) 200 MG capsule Take 1 capsule (200 mg total) by mouth every 8 (eight) hours for 10 days. 10/11/20 10/21/20 Yes Tacy Learn, PA-C  carvedilol (COREG) 25 MG tablet Take 25 mg by mouth 2 (two) times daily. 11/25/17  Yes [provider]  digoxin (LANOXIN) 0.125 MG tablet Take 0.0625 mg by mouth daily. 11/25/17  Yes [provider]  folic acid (FOLVITE) 1 MG tablet Take 1 mg by mouth daily.    Yes [provider]  furosemide (LASIX) 40 MG tablet Take 1 tablet (40 mg total) by mouth every Monday, Wednesday, and Friday. Patient taking differently: Take 40 mg by mouth 2 (two) times daily as needed for edema or fluid. 05/14/16  Yes Dixie Dials, MD  losartan (COZAAR) 25 MG tablet Take 25 mg by mouth daily. 04/27/16  Yes [provider]  metFORMIN (GLUCOPHAGE) 500 MG tablet Take 500 mg by mouth 2 (two) times daily with a meal.   Yes [provider]  amiodarone (PACERONE) 200 MG tablet Take 1 tablet (200 mg total) by mouth daily. Patient not taking: Reported on 10/11/2020 01/04/15   Dixie Dials, MD     Allergies    Clopidogrel bisulfate  Review of Systems   Review of Systems  Constitutional:  Negative for chills, diaphoresis and fever.  HENT:  Negative for congestion.   Respiratory:  Positive for cough. Negative for shortness of breath.   Cardiovascular:  Positive for chest pain. Negative for palpitations and leg swelling.  Gastrointestinal:  Negative for abdominal pain, nausea and vomiting.  Genitourinary:  Negative for dysuria.  Musculoskeletal:  Negative for arthralgias and myalgias.  Skin:  Negative for rash and wound.  Allergic/Immunologic: Positive for immunocompromised state.  Neurological:  Negative for weakness.  Hematological:  Bruises/bleeds easily.  Psychiatric/Behavioral:  Negative for confusion.   All other systems reviewed and are negative.  Physical Exam Updated Vital Signs BP (!) 148/70 (BP Location: Right Arm)   Pulse 73   Temp 98.3 F (36.8 C) (Oral)   Resp (!) 22   SpO2 98%   Physical Exam Vitals and nursing note reviewed.  Constitutional:      General: She is not in acute distress.    Appearance: She is well-developed. She is not diaphoretic.  HENT:     Head: Normocephalic and atraumatic.  Cardiovascular:     Rate and Rhythm: Normal rate and regular rhythm.     Heart sounds: Normal heart sounds. No murmur heard. Pulmonary:     Effort: Pulmonary effort is normal.     Breath sounds: Examination of the right-lower field reveals decreased breath sounds. Decreased breath sounds present.  Chest:     Chest wall: Tenderness present.    Abdominal:     Palpations: Abdomen is soft.     Tenderness: There is no abdominal tenderness.  Musculoskeletal:     Right lower leg: No tenderness. No edema.     Left lower leg: No tenderness. No edema.  Skin:    General: Skin is warm and dry.     Findings: No erythema or rash.  Neurological:     Mental Status: She is alert and oriented to person, place, and time.  Psychiatric:        Behavior: Behavior  normal.    ED Results / Procedures / Treatments   Labs (all labs ordered are listed, but only abnormal results are displayed) Labs Reviewed  CBC - Abnormal; Notable for the following components:      Result Value   WBC 3.9 (*)    All other components within normal limits  COMPREHENSIVE METABOLIC PANEL - Abnormal; Notable for the following components:   Glucose, Bld 180 (*)    Albumin 3.3 (*)    All other components within normal limits  BRAIN NATRIURETIC PEPTIDE - Abnormal; Notable for the following components:   B Natriuretic Peptide 202.6 (*)    All other components within normal limits  PROTIME-INR  TROPONIN I (HIGH SENSITIVITY)    EKG None  Radiology DG Chest 2 View  Result Date: 10/11/2020 CLINICAL DATA:  Chest pain and shortness of breath EXAM: CHEST - 2 VIEW COMPARISON:  December 01, 2017 FINDINGS: The heart size and mediastinal contours are stable. The heart size is enlarged. Cardiac pacemaker is unchanged. Small right pleural effusion is identified. The lungs are hyperinflated. There is no focal pneumonia or pulmonary edema. The visualized skeletal structures are stable. IMPRESSION: Small right pleural effusion. Electronically Signed   By: Abelardo Diesel M.D.   On: 10/11/2020 09:46    Procedures Procedures   Medications Ordered in ED Medications - No data to display  ED Course  I have reviewed the triage vital signs and the nursing notes.  Pertinent labs & imaging results that were available during my care of the patient were reviewed by me and considered in my medical decision making (see chart for details).  Clinical Course as of 10/11/20 1314  Sat Oct 11, 5353  5624 70 year old female with complaints of left lower chest pain which occurs only with coughing.  Has tenderness  to her left mid clavicular lower ribs with palpation.  Abdomen is soft and nontender.  No lower extremity edema. Lung sounds diminished right lower consistent with effusion seen on CXR.   Chest x-ray today shows small right lower effusion, unchanged when compared to her 2018 chest x-ray. Labs overall reassuring, CBC with white count of 3.9, no anemia.  BNP 202, not significant when compared with prior.  Troponin is 12.  CMP with glucose of 180, no other significant lab) treat. Mildly hypertensive with blood pressure 131/76, room air sat 98%. Patient is afebrile. [LM]  1314 EKG is paced, no acute ischemic changes, occasional PVCs. [LM]    Clinical Course User Index [LM] Roque Lias   MDM Rules/Calculators/A&P                           Final Clinical Impression(s) / ED Diagnoses Final diagnoses:  Chest pain, unspecified type  Acute cough    Rx / DC Orders ED Discharge Orders          Ordered    benzonatate (TESSALON) 200 MG capsule  Every 8 hours        10/11/20 1304             Tacy Learn, PA-C 10/11/20 1314    Carmin Muskrat, MD 10/11/20 [phone]

## 2020-10-11 NOTE — ED Provider Notes (Addendum)
Emergency Medicine Provider Triage Evaluation Note  Shelley Douglas , a 70 y.o. female  was evaluated in triage.  Pt complains of left-sided chest pressure that is been ongoing and constant for the past 2 days.  No alleviating factors but exacerbated after eating.  Also endorses shortness of breath which is new.  Prior history of toobacco use.  Prior history of MI.  Also prior history of A. fib, anticoagulated on Eliquis.  Review of Systems  Positive: Chest pain, shortness of breath Negative: Fever, cough, abdominal pain, nausea  Physical Exam  There were no vitals taken for this visit. Gen:   Awake, no distress   Resp:  Normal effort  MSK:   Moves extremities without difficulty  Other:  Shelley Douglas is in no distress, lungs are slightly diminished to auscultation.  Does have a pacemaker in place.  No bilateral pitting edema or calf tenderness.  Medical Decision Making  Medically screening exam initiated at 8:56 AM.  Appropriate orders placed.  [name] was informed that the remainder of the evaluation will be completed by another provider, this initial triage assessment does not replace that evaluation, and the importance of remaining in the ED until their evaluation is complete.  With a prior history of CAD,DM, Sroke, tobacco use, extensive medical history here for chest pain that is been ongoing for the last 2 days, constant, left-sided pressure.  No alleviating factors, has not taken any medication for improvement in symptoms.  Chest pain labs, x-ray have been ordered along with EKG.   Cardiologist Dr. Doylene Canard    Last diagnostic test in 2018  IMPRESSION: 1. No reversible ischemia.  Inferior infarct.   2. Hypokinesis involving the inferior and septal walls.   3. Left ventricular ejection fraction 36%   4. Non invasive risk stratification*: Intermediate     Shelley Fitting, PA-C 10/11/20 0859    Shelley Fitting, PA-C 10/11/20 0902    Carmin Muskrat, MD 10/11/20 [phone]

## 2020-10-11 NOTE — ED Triage Notes (Signed)
L sided chest pain x 2 days with SOB.  Denies nausea and vomiting.

## 2021-01-09 ENCOUNTER — Other Ambulatory Visit: Payer: Self-pay

## 2021-01-09 ENCOUNTER — Emergency Department (HOSPITAL_COMMUNITY)
Admission: EM | Admit: 2021-01-09 | Discharge: 2021-01-10 | Discharge status: Against Medical Advice | Payer: Medicare Other | Attending: Emergency Medicine | Admitting: Emergency Medicine

## 2021-01-09 ENCOUNTER — Encounter (HOSPITAL_COMMUNITY): Payer: Self-pay | Admitting: Emergency Medicine

## 2021-01-09 ENCOUNTER — Emergency Department (HOSPITAL_COMMUNITY): Payer: Medicare Other

## 2021-01-09 DIAGNOSIS — I4891 Unspecified atrial fibrillation: Secondary | ICD-10-CM | POA: Insufficient documentation

## 2021-01-09 DIAGNOSIS — I10 Essential (primary) hypertension: Secondary | ICD-10-CM | POA: Insufficient documentation

## 2021-01-09 DIAGNOSIS — Z7982 Long term (current) use of aspirin: Secondary | ICD-10-CM | POA: Diagnosis not present

## 2021-01-09 DIAGNOSIS — R42 Dizziness and giddiness: Secondary | ICD-10-CM | POA: Diagnosis not present

## 2021-01-09 DIAGNOSIS — Z79899 Other long term (current) drug therapy: Secondary | ICD-10-CM | POA: Diagnosis not present

## 2021-01-09 DIAGNOSIS — Z950 Presence of cardiac pacemaker: Secondary | ICD-10-CM | POA: Diagnosis not present

## 2021-01-09 DIAGNOSIS — R1110 Vomiting, unspecified: Secondary | ICD-10-CM | POA: Insufficient documentation

## 2021-01-09 LAB — URINALYSIS, ROUTINE W REFLEX MICROSCOPIC
Bilirubin Urine: NEGATIVE
Glucose, UA: NEGATIVE mg/dL
Hgb urine dipstick: NEGATIVE
Ketones, ur: NEGATIVE mg/dL
Leukocytes,Ua: NEGATIVE
Nitrite: NEGATIVE
Protein, ur: NEGATIVE mg/dL
Specific Gravity, Urine: >1.03 — ABNORMAL HIGH (ref 1.005–1.030)
pH: 6 (ref 5.0–8.0)

## 2021-01-09 LAB — COMPREHENSIVE METABOLIC PANEL
ALT: 10 U/L (ref 0–44)
AST: 16 U/L (ref 15–41)
Albumin: 3.4 g/dL — ABNORMAL LOW (ref 3.5–5.0)
Alkaline Phosphatase: 78 U/L (ref 38–126)
Anion gap: 5 (ref 5–15)
BUN: <5 mg/dL — ABNORMAL LOW (ref 8–23)
CO2: 27 mmol/L (ref 22–32)
Calcium: 10.1 mg/dL (ref 8.9–10.3)
Chloride: 105 mmol/L (ref 98–111)
Creatinine, Ser: 0.96 mg/dL (ref 0.44–1.00)
GFR, Estimated: >60 mL/min (ref >60)
Glucose, Bld: 145 mg/dL — ABNORMAL HIGH (ref 70–99)
Potassium: 3.6 mmol/L (ref 3.5–5.1)
Sodium: 137 mmol/L (ref 135–145)
Total Bilirubin: 0.5 mg/dL (ref 0.3–1.2)
Total Protein: 7.5 g/dL (ref 6.5–8.1)

## 2021-01-09 LAB — TROPONIN I (HIGH SENSITIVITY)
Troponin I (High Sensitivity): 6 ng/L (ref <18)
Troponin I (High Sensitivity): 9 ng/L (ref <18)

## 2021-01-09 LAB — CBC
HCT: 39.5 % (ref 36.0–46.0)
Hemoglobin: 12.6 g/dL (ref 12.0–15.0)
MCH: 28.6 pg (ref 26.0–34.0)
MCHC: 31.9 g/dL (ref 30.0–36.0)
MCV: 89.8 fL (ref 80.0–100.0)
Platelets: 194 10*3/uL (ref 150–400)
RBC: 4.4 MIL/uL (ref 3.87–5.11)
RDW: 14.6 % (ref 11.5–15.5)
WBC: 3.8 10*3/uL — ABNORMAL LOW (ref 4.0–10.5)
nRBC: 0 % (ref 0.0–0.2)

## 2021-01-09 MED ORDER — LORAZEPAM 2 MG/ML IJ SOLN: 0.5000 mg | Freq: Once | INTRAMUSCULAR | Status: DC

## 2021-01-09 NOTE — ED Triage Notes (Signed)
Pt. Stated, Shelley Douglas been dizzy since yesterday and I vomited one time yesterday. Its like the room is spinning.

## 2021-01-09 NOTE — ED Provider Notes (Signed)
[facility] EMERGENCY DEPARTMENT Provider Note   CSN: [id number] Arrival date & time: 01/09/21  1015     History  Chief Complaint  Patient presents with   Dizziness   Emesis    TANYIA GRABBE is a 71 y.o. female.  HPI 71 year old female history of A. fib, status post pacemaker, defibrillator, history of hypertension presents today complaining of feeling off balance with determine left visual field of left eye.  She reports that she was last normal when she went to bed on Wednesday evening.  The symptoms have been present since yesterday.  She has had difficulty walking secondary to this.  She denies any new speech deficit, difficulty swallowing, or lateralized limb weakness.  She reports she had a stroke in the 90s and had some speech difficulties at that time.  She states that symptoms wax and wane but she is currently not feeling that she is having problems from that. Primary care physician is Dr. Doylene Canard    Home Medications Prior to Admission medications   Medication Sig Start Date End Date Taking? Authorizing Provider  acetaminophen (TYLENOL) 650 MG CR tablet Take 650 mg by mouth every 8 (eight) hours as needed for pain.   Yes [provider]  amiodarone (PACERONE) 200 MG tablet Take 1 tablet (200 mg total) by mouth daily. 01/04/15  Yes Dixie Dials, MD  amLODipine (NORVASC) 5 MG tablet Take 5 mg by mouth daily.   Yes [provider]  apixaban (ELIQUIS) 5 MG TABS tablet Take 1 tablet (5 mg total) by mouth 2 (two) times daily. 05/14/16  Yes Dixie Dials, MD  aspirin EC 81 MG tablet Take 81 mg by mouth daily. Swallow whole.   Yes [provider]  atorvastatin (LIPITOR) 40 MG tablet Take 40 mg by mouth daily.   Yes [provider]  carvedilol (COREG) 25 MG tablet Take 25 mg by mouth 2 (two) times daily. 11/25/17  Yes [provider]  Cholecalciferol (VITAMIN D3 PO) Take 1 capsule by mouth daily.   Yes [provider]  digoxin (LANOXIN) 0.125 MG tablet Take 0.0625 mg by mouth daily. 11/25/17  Yes [provider]  folic acid (FOLVITE) 1 MG tablet Take 1 mg by mouth daily.    Yes [provider]  furosemide (LASIX) 40 MG tablet Take 1 tablet (40 mg total) by mouth every Monday, Wednesday, and Friday. Patient taking differently: Take 40 mg by mouth 2 (two) times daily as needed for edema or fluid. 05/14/16  Yes Dixie Dials, MD  losartan (COZAAR) 25 MG tablet Take 25 mg by mouth daily. 04/27/16  Yes [provider]  metFORMIN (GLUCOPHAGE) 500 MG tablet Take 500 mg by mouth 2 (two) times daily with a meal.   Yes [provider]      Allergies    Clopidogrel bisulfate    Review of Systems   Review of Systems  All other systems reviewed and are negative.  Physical Exam Updated Vital Signs BP (!) 170/73    Pulse 70    Temp 99 F (37.2 C) (Oral)    Resp 18    Ht 1.702 m (5\' 7" )    Wt 103 kg    SpO2 95%    BMI 35.55 kg/m  Physical Exam Vitals and nursing note reviewed.  Constitutional:      General: She is not in acute distress.    Appearance: Normal appearance. She is well-developed.  HENT:     Head:  Normocephalic and atraumatic.     Right Ear: External ear normal.     Left Ear: External ear normal.     Nose: Nose normal.  Eyes:     Conjunctiva/sclera: Conjunctivae normal.     Pupils: Pupils are equal, round, and reactive to light.  Cardiovascular:     Rate and Rhythm: Normal rate and regular rhythm.  Pulmonary:     Effort: Pulmonary effort is normal.  Abdominal:     General: Abdomen is flat.     Palpations: Abdomen is soft.  Musculoskeletal:        General: Normal range of motion.     Cervical back: Normal range of motion and neck supple.  Skin:    General: Skin is warm and dry.  Neurological:     General: No focal deficit present.     Mental Status: She is alert and oriented to person, place, and time.     Cranial Nerves: No cranial nerve  deficit.     Sensory: No sensory deficit.     Motor: No weakness or abnormal muscle tone.     Coordination: Coordination normal.     Deep Tendon Reflexes: Reflexes normal.  Psychiatric:        Behavior: Behavior normal.        Thought Content: Thought content normal.    ED Results / Procedures / Treatments   Labs (all labs ordered are listed, but only abnormal results are displayed) Labs Reviewed  CBC - Abnormal; Notable for the following components:      Result Value   WBC 3.8 (*)    All other components within normal limits  URINALYSIS, ROUTINE W REFLEX MICROSCOPIC - Abnormal; Notable for the following components:   Specific Gravity, Urine >1.030 (*)    All other components within normal limits  COMPREHENSIVE METABOLIC PANEL - Abnormal; Notable for the following components:   Glucose, Bld 145 (*)    BUN <5 (*)    Albumin 3.4 (*)    All other components within normal limits  URINE CULTURE  CBG MONITORING, ED  TROPONIN I (HIGH SENSITIVITY)  TROPONIN I (HIGH SENSITIVITY)    EKG EKG Interpretation  Date/Time:  Friday January 09 2021 10:29:11 EST Ventricular Rate:  80 PR Interval:  148 QRS Duration: 120 QT Interval:  404 QTC Calculation: 465 R Axis:   139 Text Interpretation: Atrial-sensed ventricular-paced rhythm with occasional Premature ventricular complexes Biventricular pacemaker detected Abnormal ECG When compared with ECG of 11-Oct-2020 09:00, PREVIOUS ECG IS PRESENT Confirmed by Pattricia Boss [phone]) on 01/09/2021 6:03:38 PM  Radiology CT HEAD WO CONTRAST (5MM)  Result Date: 01/09/2021 CLINICAL DATA:  Dizziness, persistent/recurrent, cardiac or vascular cause suspected EXAM: CT HEAD WITHOUT CONTRAST TECHNIQUE: Contiguous axial images were obtained from the base of the skull through the vertex without intravenous contrast. COMPARISON:  May 2018 FINDINGS: Brain: There is no acute intracranial hemorrhage, mass effect, or edema. No acute appearing loss of gray-white  differentiation. There are chronic appearing infarcts of the right frontal lobe, left parietal lobe, and right thalamus. Minimal additional patchy low-density in the supratentorial white matter is nonspecific but may reflect chronic microvascular ischemic changes. There is no extra-axial fluid collection. Ventricles and sulci are slightly more prominent reflecting minor volume loss. Vascular: There is atherosclerotic calcification at the skull base. Skull: Calvarium is unremarkable. Sinuses/Orbits: No acute finding. Other: Incidental note is made of a partially empty sella. IMPRESSION: No acute intracranial hemorrhage or evidence of acute infarction. Few chronic infarcts. Electronically  Signed   By: Macy Mis M.D.   On: 01/09/2021 11:56    Procedures Procedures    Medications Ordered in ED Medications  LORazepam (ATIVAN) injection 0.5 mg (has no administration in time range)    ED Course/ Medical Decision Making/ A&P                           Medical Decision Making Patient presents today with this and a visual scotoma. CT of the head obtained prior to my evaluation shows no acute intracranial hemorrhage or infarct Patient is outside of acute stroke window On further interview with patient, she has recently had some noncompliance with her Eliquis stating that she did not take it on Monday.  She has not had this since she has been here today.  They are unable to take it obtain MRI due to pacemaker placement. Patient care discussed with Dr. Inez Catalina will see and evaluate patient and make further recommendations. Discussed this with Dr. Grafton Folk and he will assume care   Amount and/or Complexity of Data Reviewed External Data Reviewed: notes. Labs: ordered. Decision-making details documented in ED Course. Radiology: ordered and independent interpretation performed. Decision-making details documented in ED Course. ECG/medicine tests: ordered and independent interpretation performed.  Decision-making details documented in ED Course.  Final Clinical Impression(s) / ED Diagnoses Final diagnoses:  Vertigo    Rx / DC Orders ED Discharge Orders     None         Pattricia Boss, MD 01/09/21 2301

## 2021-01-09 NOTE — ED Notes (Signed)
Patient transported to MRI 

## 2021-01-09 NOTE — ED Provider Triage Note (Signed)
Emergency Medicine Provider Triage Evaluation Note  Shelley Douglas , a 71 y.o. female  was evaluated in triage.  Pt complains of dizziness and left-sided "something off in my vision."  States that symptoms may have began 24 hours ago but she is unsure of the exact timing that they began.  She reports "lines or something" in her left visual field but denies any vision loss or trouble seeing.  States that she is having sensation that she is spinning when she stands up.  Had 1 episode of nonbloody, nonbilious emesis yesterday.  Denying any chest pain, headache, numbness in arms or legs.  She underwent a 24-hour fast earlier this week but has since then been eating although "not as much as I usually do."  Denies any injuries or falls.  Review of Systems  Positive: Dizziness, vomiting Negative: Chest pain  Physical Exam  BP (!) 165/93 (BP Location: Left Arm)    Pulse 76    Temp 98.7 F (37.1 C) (Oral)    Resp 16    SpO2 99%  Gen:   Awake, no distress   Resp:  Normal effort  MSK:   Moves extremities without difficulty  Other:  Visual fields are intact.  No facial asymmetry.  Strength 5/5 in bilateral upper and lower extremities.  No aphasia  Medical Decision Making  Medically screening exam initiated at 10:54 AM.  Appropriate orders placed.  [name] was informed that the remainder of the evaluation will be completed by another provider, this initial triage assessment does not replace that evaluation, and the importance of remaining in the ED until their evaluation is complete.  No indication for code stroke due to timing and neurological exam but will order CT head as well as further work-up with lab   Delia Heady, PA-C 01/09/21 1056

## 2021-01-10 DIAGNOSIS — R42 Dizziness and giddiness: Secondary | ICD-10-CM

## 2021-01-10 NOTE — ED Notes (Signed)
Pt requesting wheelchair in order to leave. Pt states she has been waiting too long and would like to go home and rest. EDP Pollina made aware and went to talk with pt and family prior to [facility] status.

## 2021-01-10 NOTE — ED Provider Notes (Signed)
Alerted by nursing staff that the patient was asking to be discharged.  Patient was seen earlier for feeling off balance, possible dizziness and visual disturbance.  Stroke was considered possible but patient could not get an MRI because of her pacemaker.  Awaiting neurology consultation.  Patient indicates that she wants to be discharged immediately.  I did discuss the severity of the diagnosis with the patient.  I told her that I cannot rule out stroke, this could worsen because permanent disability or death but she does not wish to stay.  Patient indicates that she has had a stroke previously and understands what a stroke is.  Her husband wants to go home and she wants to go home.  She is awake, alert, oriented.  She has capacity and understanding of her decision, will charge [place].   Orpah Greek, MD 01/10/21 [phone]

## 2021-01-10 NOTE — Consult Note (Signed)
No charge note:  I went down to see Shelley Douglas. She unfortunately had left the ED before I could get to her.  [place] Pager Number [phone]

## 2021-01-11 LAB — URINE CULTURE

## 2021-01-12 ENCOUNTER — Encounter (HOSPITAL_COMMUNITY): Payer: Self-pay | Admitting: Emergency Medicine

## 2021-01-12 ENCOUNTER — Observation Stay (HOSPITAL_COMMUNITY)
Admission: EM | Admit: 2021-01-12 | Discharge: 2021-01-14 | Disposition: A | Discharge status: Home / Self Care | Payer: Medicare Other | Attending: Internal Medicine | Admitting: Internal Medicine

## 2021-01-12 DIAGNOSIS — I10 Essential (primary) hypertension: Secondary | ICD-10-CM | POA: Diagnosis present

## 2021-01-12 DIAGNOSIS — I4891 Unspecified atrial fibrillation: Secondary | ICD-10-CM | POA: Diagnosis present

## 2021-01-12 DIAGNOSIS — Z87891 Personal history of nicotine dependence: Secondary | ICD-10-CM | POA: Insufficient documentation

## 2021-01-12 DIAGNOSIS — R2681 Unsteadiness on feet: Secondary | ICD-10-CM | POA: Diagnosis not present

## 2021-01-12 DIAGNOSIS — I110 Hypertensive heart disease with heart failure: Secondary | ICD-10-CM | POA: Insufficient documentation

## 2021-01-12 DIAGNOSIS — R2990 Unspecified symptoms and signs involving the nervous system: Secondary | ICD-10-CM

## 2021-01-12 DIAGNOSIS — Z20822 Contact with and (suspected) exposure to covid-19: Secondary | ICD-10-CM | POA: Diagnosis not present

## 2021-01-12 DIAGNOSIS — E119 Type 2 diabetes mellitus without complications: Secondary | ICD-10-CM | POA: Diagnosis not present

## 2021-01-12 DIAGNOSIS — Z7982 Long term (current) use of aspirin: Secondary | ICD-10-CM | POA: Diagnosis not present

## 2021-01-12 DIAGNOSIS — R42 Dizziness and giddiness: Secondary | ICD-10-CM | POA: Diagnosis present

## 2021-01-12 DIAGNOSIS — Z79899 Other long term (current) drug therapy: Secondary | ICD-10-CM | POA: Insufficient documentation

## 2021-01-12 DIAGNOSIS — E1169 Type 2 diabetes mellitus with other specified complication: Secondary | ICD-10-CM

## 2021-01-12 DIAGNOSIS — I639 Cerebral infarction, unspecified: Principal | ICD-10-CM | POA: Insufficient documentation

## 2021-01-12 DIAGNOSIS — E785 Hyperlipidemia, unspecified: Secondary | ICD-10-CM

## 2021-01-12 DIAGNOSIS — Z7901 Long term (current) use of anticoagulants: Secondary | ICD-10-CM | POA: Diagnosis not present

## 2021-01-12 DIAGNOSIS — Z7984 Long term (current) use of oral hypoglycemic drugs: Secondary | ICD-10-CM | POA: Diagnosis not present

## 2021-01-12 DIAGNOSIS — R4781 Slurred speech: Secondary | ICD-10-CM | POA: Insufficient documentation

## 2021-01-12 DIAGNOSIS — I6330 Cerebral infarction due to thrombosis of unspecified cerebral artery: Secondary | ICD-10-CM | POA: Insufficient documentation

## 2021-01-12 DIAGNOSIS — I5022 Chronic systolic (congestive) heart failure: Secondary | ICD-10-CM | POA: Diagnosis not present

## 2021-01-12 DIAGNOSIS — I2510 Atherosclerotic heart disease of native coronary artery without angina pectoris: Secondary | ICD-10-CM | POA: Diagnosis not present

## 2021-01-12 LAB — COMPREHENSIVE METABOLIC PANEL
ALT: 11 U/L (ref 0–44)
AST: 16 U/L (ref 15–41)
Albumin: 3.6 g/dL (ref 3.5–5.0)
Alkaline Phosphatase: 80 U/L (ref 38–126)
Anion gap: 7 (ref 5–15)
BUN: 19 mg/dL (ref 8–23)
CO2: 31 mmol/L (ref 22–32)
Calcium: 11.2 mg/dL — ABNORMAL HIGH (ref 8.9–10.3)
Chloride: 103 mmol/L (ref 98–111)
Creatinine, Ser: 1.15 mg/dL — ABNORMAL HIGH (ref 0.44–1.00)
GFR, Estimated: 51 mL/min — ABNORMAL LOW (ref >60)
Glucose, Bld: 112 mg/dL — ABNORMAL HIGH (ref 70–99)
Potassium: 3.5 mmol/L (ref 3.5–5.1)
Sodium: 141 mmol/L (ref 135–145)
Total Bilirubin: 0.6 mg/dL (ref 0.3–1.2)
Total Protein: 7.9 g/dL (ref 6.5–8.1)

## 2021-01-12 LAB — CBC WITH DIFFERENTIAL/PLATELET
Abs Immature Granulocytes: 0 10*3/uL (ref 0.00–0.07)
Basophils Absolute: 0 10*3/uL (ref 0.0–0.1)
Basophils Relative: 1 %
Eosinophils Absolute: 0.1 10*3/uL (ref 0.0–0.5)
Eosinophils Relative: 2 %
HCT: 40.2 % (ref 36.0–46.0)
Hemoglobin: 12.7 g/dL (ref 12.0–15.0)
Immature Granulocytes: 0 %
Lymphocytes Relative: 35 %
Lymphs Abs: 1.6 10*3/uL (ref 0.7–4.0)
MCH: 28.1 pg (ref 26.0–34.0)
MCHC: 31.6 g/dL (ref 30.0–36.0)
MCV: 88.9 fL (ref 80.0–100.0)
Monocytes Absolute: 0.5 10*3/uL (ref 0.1–1.0)
Monocytes Relative: 11 %
Neutro Abs: 2.3 10*3/uL (ref 1.7–7.7)
Neutrophils Relative %: 51 %
Platelets: 205 10*3/uL (ref 150–400)
RBC: 4.52 MIL/uL (ref 3.87–5.11)
RDW: 14.6 % (ref 11.5–15.5)
WBC: 4.4 10*3/uL (ref 4.0–10.5)
nRBC: 0 % (ref 0.0–0.2)

## 2021-01-12 NOTE — ED Provider Triage Note (Signed)
Emergency Medicine Provider Triage Evaluation Note  Shelley Douglas , a 71 y.o. female  was evaluated in triage.  Pt complains of dizziness and loss of balance.  States that this has been present over the last 2 days.  Per chart review patient was seen in the emergency department on 1/6.  MRI was unable to be obtained due to patient's pacemaker.  Neurology was consulted however patient left AMA prior to evaluation by neurologist.  Patient states that Friday morning she had episode of numbness to her feet.  Patient also reports that she began having blurred vision to left eye.  Patient reports that yesterday she developed a headache.  Headache is since improved since then.  Denies any numbness or weakness at present.  Review of Systems  Positive: Dizziness, blurred vision of the left eye, numbness to bilateral feet, headache. Negative:   Physical Exam  BP (!) 143/69 (BP Location: Right Arm)    Pulse 76    Temp 98.4 F (36.9 C) (Oral)    Resp 16    SpO2 100%  Gen:   Awake, no distress   Resp:  Normal effort  MSK:   Moves extremities without difficulty  Other:  +5 strength to bilateral upper and lower extremities.  Negative pronator drift.  CN II through XII intact.  Normal finger-to-nose motion.  Sensation to light touch grossly intact to bilateral upper and lower extremities.  Medical Decision Making  Medically screening exam initiated at 3:28 PM.  Appropriate orders placed.  Shelley Douglas was informed that the remainder of the evaluation will be completed by another provider, this initial triage assessment does not replace that evaluation, and the importance of remaining in the ED until their evaluation is complete.     Loni Beckwith, [place] 01/12/21 1530

## 2021-01-12 NOTE — ED Triage Notes (Signed)
Patient here with complaint of dizziness, states she was here for the same a few days ago but left AMA. Patient states she has been feeling dizzy for a couple of days. Patient alert, denies pain, no focal weakness.

## 2021-01-13 ENCOUNTER — Observation Stay (HOSPITAL_COMMUNITY): Payer: Medicare Other

## 2021-01-13 DIAGNOSIS — E1169 Type 2 diabetes mellitus with other specified complication: Secondary | ICD-10-CM

## 2021-01-13 DIAGNOSIS — I639 Cerebral infarction, unspecified: Secondary | ICD-10-CM | POA: Diagnosis not present

## 2021-01-13 DIAGNOSIS — R2990 Unspecified symptoms and signs involving the nervous system: Secondary | ICD-10-CM | POA: Diagnosis present

## 2021-01-13 DIAGNOSIS — R42 Dizziness and giddiness: Secondary | ICD-10-CM

## 2021-01-13 DIAGNOSIS — E119 Type 2 diabetes mellitus without complications: Secondary | ICD-10-CM

## 2021-01-13 DIAGNOSIS — I2510 Atherosclerotic heart disease of native coronary artery without angina pectoris: Secondary | ICD-10-CM | POA: Diagnosis present

## 2021-01-13 DIAGNOSIS — E785 Hyperlipidemia, unspecified: Secondary | ICD-10-CM

## 2021-01-13 DIAGNOSIS — I10 Essential (primary) hypertension: Secondary | ICD-10-CM | POA: Diagnosis present

## 2021-01-13 LAB — HEMOGLOBIN A1C
Hgb A1c MFr Bld: 7.2 % — ABNORMAL HIGH (ref 4.8–5.6)
Mean Plasma Glucose: 159.94 mg/dL

## 2021-01-13 LAB — LIPID PANEL
Cholesterol: 280 mg/dL — ABNORMAL HIGH (ref 0–200)
HDL: 63 mg/dL (ref >40)
LDL Cholesterol: 195 mg/dL — ABNORMAL HIGH (ref 0–99)
Total CHOL/HDL Ratio: 4.4 RATIO
Triglycerides: 108 mg/dL (ref <150)
VLDL: 22 mg/dL (ref 0–40)

## 2021-01-13 LAB — RESP PANEL BY RT-PCR (FLU A&B, COVID) ARPGX2
Influenza A by PCR: NEGATIVE
Influenza B by PCR: NEGATIVE
SARS Coronavirus 2 by RT PCR: NEGATIVE

## 2021-01-13 LAB — CBG MONITORING, ED: Glucose-Capillary: 111 mg/dL — ABNORMAL HIGH (ref 70–99)

## 2021-01-13 MED ORDER — AMIODARONE HCL 200 MG PO TABS
200.0000 mg | ORAL_TABLET | Freq: Every day | ORAL | Status: DC
  Administered 2021-01-13 – 2021-01-14 (×2): 200 mg via ORAL
  Filled 2021-01-13 (×2): qty 1

## 2021-01-13 MED ORDER — ACETAMINOPHEN 325 MG PO TABS: 650.0000 mg | ORAL_TABLET | ORAL | Status: DC | PRN

## 2021-01-13 MED ORDER — LOSARTAN POTASSIUM 25 MG PO TABS
25.0000 mg | ORAL_TABLET | Freq: Every day | ORAL | Status: DC
  Administered 2021-01-13 – 2021-01-14 (×2): 25 mg via ORAL
  Filled 2021-01-13 (×2): qty 1

## 2021-01-13 MED ORDER — DIGOXIN 125 MCG PO TABS
0.0625 mg | ORAL_TABLET | Freq: Every day | ORAL | Status: DC
  Administered 2021-01-13 – 2021-01-14 (×2): 0.0625 mg via ORAL
  Filled 2021-01-13 (×2): qty 1

## 2021-01-13 MED ORDER — STROKE: EARLY STAGES OF RECOVERY BOOK
Freq: Once | Status: AC
  Filled 2021-01-13: qty 1

## 2021-01-13 MED ORDER — CARVEDILOL 12.5 MG PO TABS
25.0000 mg | ORAL_TABLET | Freq: Two times a day (BID) | ORAL | Status: DC
  Administered 2021-01-13 – 2021-01-14 (×2): 25 mg via ORAL
  Filled 2021-01-13: qty 8
  Filled 2021-01-13: qty 2
  Filled 2021-01-13: qty 8

## 2021-01-13 MED ORDER — AMLODIPINE BESYLATE 5 MG PO TABS
5.0000 mg | ORAL_TABLET | Freq: Every day | ORAL | Status: DC
  Administered 2021-01-14: 5 mg via ORAL
  Filled 2021-01-13: qty 1

## 2021-01-13 MED ORDER — APIXABAN 5 MG PO TABS
5.0000 mg | ORAL_TABLET | Freq: Two times a day (BID) | ORAL | Status: DC
Start: 2021-01-13 — End: 2021-01-14
  Administered 2021-01-13 – 2021-01-14 (×2): 5 mg via ORAL
  Filled 2021-01-13 (×3): qty 1

## 2021-01-13 MED ORDER — INSULIN ASPART 100 UNIT/ML IJ SOLN
0.0000 [IU] | Freq: Three times a day (TID) | INTRAMUSCULAR | Status: DC
  Administered 2021-01-14 (×2): 3 [IU] via SUBCUTANEOUS

## 2021-01-13 MED ORDER — ACETAMINOPHEN 160 MG/5ML PO SOLN: 650.0000 mg | ORAL | Status: DC | PRN

## 2021-01-13 MED ORDER — MECLIZINE HCL 12.5 MG PO TABS: 25.0000 mg | ORAL_TABLET | Freq: Two times a day (BID) | ORAL | Status: DC | PRN

## 2021-01-13 MED ORDER — SENNOSIDES-DOCUSATE SODIUM 8.6-50 MG PO TABS: 1.0000 | ORAL_TABLET | Freq: Every evening | ORAL | Status: DC | PRN

## 2021-01-13 MED ORDER — ACETAMINOPHEN 650 MG RE SUPP: 650.0000 mg | RECTAL | Status: DC | PRN

## 2021-01-13 NOTE — Assessment & Plan Note (Signed)
V paced rhythm cha2ds2-vasc of 8 Continue eliquis/amiodarone/digoxin Telemetry

## 2021-01-13 NOTE — Assessment & Plan Note (Addendum)
Decently well controlled Stroke like symptoms have been going on for 5+days, continue to treat HTN Continue home medication: cozaar 25mg , coreg 25mg  bid, norvasc 5mg  and lasix 40mg  prn

## 2021-01-13 NOTE — Assessment & Plan Note (Signed)
Repeat tomorrow, if continues to be elevated will need hypercalcemia w/u

## 2021-01-13 NOTE — Assessment & Plan Note (Addendum)
Stable, continue medical management with ASA, coreg and lipitor  LDL of 195, change to crestor or may be candidate for PCSK9 inhibitor.

## 2021-01-13 NOTE — Consult Note (Signed)
Neurology Consultation  Reason for Consult: Dizziness, slurred speech Referring Physician: Dr. Francia Greaves  CC: Dizziness with movement, slurred speech, intermittent left visual field deficits  History is obtained from: Patient, Chart review  HPI: Shelley Douglas is a 71 y.o. female with a medical history significant for coronary artery disease, AICD placement, myocardial infarction, DVT, hyperlipidemia, essential hypertension, obesity, persistent atrial fibrillation on Eliquis, history of pulmonary embolism, stroke with initial presentation as dysarthria without residual deficit, and type 2 diabetes mellitus who presented to the ED 01/12/21 for evaluation of dizziness. Patient was seen on 1/6 for dizziness with trouble walking as well with intermittent left visual field deficits and slurred speech (reportedly worse than with her previous stroke) since Thursday morning 01/08/2021. She left AMA on 1/6 due to prolonged wait times prior to neurology evaluation. She states that at home her dizziness has persisted along with slurred speech and that she was having trouble walking without holding onto objects at home. She then decided to come back to the ED for further evaluation. She states that this morning, she noticed that she was having trouble seeing in her left peripheral visual field that has also since resolved. She denies nausea, vomiting, or recent illness. Of note, Ms. Ohmann states that she missed her Eliquis dosing on Monday 1/2 morning and evening doses due to fasting. MRI brain is unable to be obtained due to patient's PPM. Neurology was consulted for further evaluation.   LKW: 01/07/2021 prior to bed TNK given?: no, patient presented outside of the thrombolytic therapy window IR Thrombectomy? No, patient's presentation is not consistent with an LVO Modified Rankin Scale: 0-Completely asymptomatic and back to baseline post- stroke  ROS: A complete ROS was performed and is negative except as noted  in the HPI.   Past Medical History:  Diagnosis Date   AICD (automatic cardioverter/defibrillator) present    Anxiety    pt denies this hx on 05/12/2016   CAD (coronary artery disease)    last cath 2016   Daily headache    DVT (deep venous thrombosis) ([place]) 1996   High cholesterol    History of blood transfusion    "related to pregnancy" (05/12/2016)   Hypertension    "just started taking RX" (05/12/2016)   Ischemic cardiomyopathy    Left bundle branch block    Mitral valve regurgitation    Myocardial infarction ([place]) ~ 2009 X2   Obesity    Persistent atrial fibrillation ([place])    Pulmonary embolism ([place]) 1993   Seizures ([place]) 1970s   "I had 1; don't know what it was from" (05/12/2016)   Stroke [facility] 2001   denies residual on 05/12/2016   Type II diabetes mellitus ([place])    Past Surgical History:  Procedure Laterality Date   BILATERAL CARPAL TUNNEL RELEASE Bilateral 1990s   CARDIAC CATHETERIZATION N/A 09/27/2014   Procedure: Left Heart Cath and Coronary Angiography;  Surgeon: Dixie Dials, MD;  Location: [place] [place] [facility];  Service: Cardiovascular;  Laterality: N/A;   CARDIAC DEFIBRILLATOR PLACEMENT  07/24/2007   Medtronic Secura DR implanted in [place] for primary prevention   CORONARY ANGIOPLASTY WITH STENT PLACEMENT  ~ 2009   CORONARY ARTERY BYPASS GRAFT  ~ 2009   DILATION AND CURETTAGE OF UTERUS     EP IMPLANTABLE DEVICE N/A 07/01/2015   BiV ICD upgrade to Medtronic Viva Quad XT CRT-D device for primary prevention   EXTERNAL EAR SURGERY Right 1990   "skin grafted from my right thigh and put on  my right ear"   HERNIA REPAIR  1990's    "stomach"   TUBAL LIGATION     VENA CAVA FILTER PLACEMENT  1993   Family History  Problem Relation Age of Onset   Hypertension Unknown    Social History:   reports that she quit smoking about 33 years ago. Her smoking use included cigarettes. She has never used smokeless tobacco. She reports that she does not drink alcohol and does not use  drugs.  Medications No current facility-administered medications for this encounter.  Current Outpatient Medications:    acetaminophen (TYLENOL) 650 MG CR tablet, Take 650 mg by mouth every 8 (eight) hours as needed for pain., Disp: , Rfl:    amiodarone (PACERONE) 200 MG tablet, Take 1 tablet (200 mg total) by mouth daily., Disp: 30 tablet, Rfl: 3   amLODipine (NORVASC) 5 MG tablet, Take 5 mg by mouth daily., Disp: , Rfl:    apixaban (ELIQUIS) 5 MG TABS tablet, Take 1 tablet (5 mg total) by mouth 2 (two) times daily., Disp: 60 tablet, Rfl: 3   aspirin EC 81 MG tablet, Take 81 mg by mouth daily. Swallow whole., Disp: , Rfl:    atorvastatin (LIPITOR) 40 MG tablet, Take 40 mg by mouth daily., Disp: , Rfl:    carvedilol (COREG) 25 MG tablet, Take 25 mg by mouth 2 (two) times daily., Disp: , Rfl:    Cholecalciferol (VITAMIN D3 PO), Take 1 capsule by mouth daily., Disp: , Rfl:    digoxin (LANOXIN) 0.125 MG tablet, Take 0.0625 mg by mouth daily., Disp: , Rfl:    folic acid (FOLVITE) 1 MG tablet, Take 1 mg by mouth daily. , Disp: , Rfl:    furosemide (LASIX) 40 MG tablet, Take 1 tablet (40 mg total) by mouth every Monday, Wednesday, and Friday. (Patient taking differently: Take 40 mg by mouth 2 (two) times daily as needed for edema or fluid.), Disp: , Rfl:    losartan (COZAAR) 25 MG tablet, Take 25 mg by mouth daily., Disp: , Rfl:    metFORMIN (GLUCOPHAGE) 500 MG tablet, Take 500 mg by mouth 2 (two) times daily with a meal., Disp: , Rfl:   Exam: Current vital signs: BP 136/69    Pulse 69    Temp 98.3 F (36.8 C)    Resp 19    SpO2 96%  Vital signs in last 24 hours: Temp:  [98.1 F (36.7 C)-98.7 F (37.1 C)] 98.3 F (36.8 C) (01/10 0348) Pulse Rate:  [69-85] 69 (01/10 0930) Resp:  [16-20] 19 (01/10 0930) BP: (124-143)/(59-78) 136/69 (01/10 0930) SpO2:  [96 %-100 %] 96 % (01/10 0930)  GENERAL: Awake, alert, in no acute distress Psych: Affect appropriate for situation, patient is calm and  cooperative with examination Head: Normocephalic and atraumatic, without obvious abnormality EENT: Normal conjunctivae, dry mucous membranes, no OP obstruction, arcus senilis present bilaterally LUNGS: Normal respiratory effort. Non-labored breathing on room air. CV: Regular rate and rhythm on telemetry ABDOMEN: Soft, non-tender, non-distended Extremities: Warm, well perfused, without obvious deformity  NEURO:  Mental Status: Awake, alert, and oriented to person, place, time, and situation. She is able to provide a clear and coherent history of present illness. Speech/Language: speech is mildly dysarthric   Naming, repetition, fluency, and comprehension intact without aphasia  No neglect is noted Cranial Nerves:  II: PERRL 3 mm/brisk. Visual fields full.  III, IV, VI: EOMI without ptosis, gaze preference, or nystagmus  V: Sensation is intact to light touch and symmetrical  to face. VII: Face is symmetric resting and smiling.  VIII: Hearing is intact to voice IX, X: Palate elevation is symmetric. Phonation normal.  XI: Normal sternocleidomastoid and trapezius muscle strength XII: Tongue protrudes midline without fasciculations.   Motor: 5/5 strength is all muscle groups without vertical drift. Tone is normal. Bulk is normal.  Sensation: Intact to light touch bilaterally in all four extremities. No extinction to DSS present.  Coordination: FTN intact bilaterally. HKS intact bilaterally. No pronator drift.  Gait: Deferred  NIHSS: 1a Level of Conscious.: 0 1b LOC Questions: 0 1c LOC Commands: 0 2 Best Gaze: 0 3 Visual: 0 4 Facial Palsy: 0 5a Motor Arm - left: 0 5b Motor Arm - Right: 0 6a Motor Leg - Left: 0 6b Motor Leg - Right: 0 7 Limb Ataxia: 0 8 Sensory: 0 9 Best Language: 0 10 Dysarthria: 1 11 Extinct. and Inatten.: 0 TOTAL: 1  Labs I have reviewed labs in epic and the results pertinent to this consultation are: CBC    Component Value Date/Time   WBC 4.4 01/12/2021  1534   RBC 4.52 01/12/2021 1534   HGB 12.7 01/12/2021 1534   HCT 40.2 01/12/2021 1534   PLT 205 01/12/2021 1534   MCV 88.9 01/12/2021 1534   MCH 28.1 01/12/2021 1534   MCHC 31.6 01/12/2021 1534   RDW 14.6 01/12/2021 1534   LYMPHSABS 1.6 01/12/2021 1534   MONOABS 0.5 01/12/2021 1534   EOSABS 0.1 01/12/2021 1534   BASOSABS 0.0 01/12/2021 1534   CMP     Component Value Date/Time   NA 141 01/12/2021 1534   NA 140 12/26/2017 1025   K 3.5 01/12/2021 1534   CL 103 01/12/2021 1534   CO2 31 01/12/2021 1534   GLUCOSE 112 (H) 01/12/2021 1534   BUN 19 01/12/2021 1534   BUN 18 12/26/2017 1025   CREATININE 1.15 (H) 01/12/2021 1534   CREATININE 0.93 06/20/2015 1021   CALCIUM 11.2 (H) 01/12/2021 1534   PROT 7.9 01/12/2021 1534   ALBUMIN 3.6 01/12/2021 1534   AST 16 01/12/2021 1534   ALT 11 01/12/2021 1534   ALKPHOS 80 01/12/2021 1534   BILITOT 0.6 01/12/2021 1534   GFRNONAA 51 (L) 01/12/2021 1534   GFRAA 66 12/26/2017 1025   Lipid Panel     Component Value Date/Time   CHOL 241 (H) 05/14/2016 1000   TRIG 124 05/14/2016 1000   HDL 52 05/14/2016 1000   CHOLHDL 4.6 05/14/2016 1000   VLDL 25 05/14/2016 1000   LDLCALC 164 (H) 05/14/2016 1000   Lab Results  Component Value Date   HGBA1C 6.8 (H) 05/12/2011   Imaging I have reviewed the images obtained:  CT-scan of the brain 1/6: No acute intracranial hemorrhage or evidence of acute infarction. Few chronic infarcts.  Assessment: 71 y.o. woman with extensive PMHx as above, including AF on Eliquis with missed doses on Monday 1/2 with onset of dizziness, dysarthria, and intermittent left visual field deficits who presented to the ED 1/9 after leaving AMA on 1/6 with complaints of persistent dizziness with movement.  - Examination reveals patient with mild dysarthria. NIHSS of 1.  - CT imaging on 1/6 is without acute intracranial hemorrhage or infarction.  - Presentation is concerning for acute ischemic stroke versus TIA with  fluctuating left visual field deficits. 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  - In the context of the patient being a poor historian, she later endorsed to the Neurology attending having had acute onset of presyncopal sensation, speech deficit and transient bilateral visual obscuration with a shimmering versus oscillopsia quality which occurred when standing suddenly upon getting off the toilet. DDx therefor also includes orthostatic presyncope.  - Stroke risk factors include patient's advanced age, CAD, DVT/PE, HLD, HTN, obesity, AF with missed doses of Eliquis, and history of stroke.   Recommendations: - Repeat CT head wo contrast in 48 hours to assess for possible early subacute posterior fossa stroke not detectable on initial CT - CT angio head and neck for vessel imaging  - Unable to obtain MRI brain due to PPM - HgbA1c, fasting lipid panel - Frequent neuro checks - Echocardiogram - Prophylactic therapy- Continue home Eliquis - For her CAD, continue home ASA - Risk factor modification - Telemetry monitoring - PT consult, OT consult, Speech consult - Orthostatics  - Stroke team to follow  Pt seen by NP/Neuro and later by MD.  Anibal Henderson, AGAC-NP [facility] Pager: [phone]  I have seen and examined the patient. I have formulated the assessment and recommendations. 71 year old female with multiple stroke risk factors including atrial fibrillation and recently missed Eliquis doses who presented to the ED with persistent dizziness exacerbated by movement, as well as transient visual field cut. Exam reveals mild dysarthria. CT head shows not acute abnormality. DDx includes TIA, stroke and possible exacerbation of symptoms by orthostatic hypotension. Recommendations as above.   Electronically signed: Dr. Kerney Elbe

## 2021-01-13 NOTE — ED Provider Notes (Signed)
[place] EMERGENCY DEPARTMENT Provider Note   CSN: [id number] Arrival date & time: 01/12/21  1428     History  Chief Complaint  Patient presents with   Dizziness    Shelley Douglas is a 71 y.o. female.  The history is provided by the patient. No language interpreter was used.  Dizziness Quality:  Vertigo Severity:  Moderate Onset quality:  Gradual Duration:  4 days Timing:  Constant Progression:  Worsening Chronicity:  New Context: not with loss of consciousness   Relieved by:  Nothing Worsened by:  Nothing Ineffective treatments:  None tried Associated symptoms: no headaches   Risk factors: hx of stroke       Home Medications Prior to Admission medications   Medication Sig Start Date End Date Taking? Authorizing Provider  acetaminophen (TYLENOL) 650 MG CR tablet Take 650 mg by mouth every 8 (eight) hours as needed for pain.    [provider]  amiodarone (PACERONE) 200 MG tablet Take 1 tablet (200 mg total) by mouth daily. 01/04/15   Dixie Dials, MD  amLODipine (NORVASC) 5 MG tablet Take 5 mg by mouth daily.    [provider]  apixaban (ELIQUIS) 5 MG TABS tablet Take 1 tablet (5 mg total) by mouth 2 (two) times daily. 05/14/16   Dixie Dials, MD  aspirin EC 81 MG tablet Take 81 mg by mouth daily. Swallow whole.    [provider]  atorvastatin (LIPITOR) 40 MG tablet Take 40 mg by mouth daily.    [provider]  carvedilol (COREG) 25 MG tablet Take 25 mg by mouth 2 (two) times daily. 11/25/17   [provider]  Cholecalciferol (VITAMIN D3 PO) Take 1 capsule by mouth daily.    [provider]  digoxin (LANOXIN) 0.125 MG tablet Take 0.0625 mg by mouth daily. 11/25/17   [provider]  folic acid (FOLVITE) 1 MG tablet Take 1 mg by mouth daily.     [provider]  furosemide (LASIX) 40 MG tablet Take 1 tablet (40 mg total) by mouth every Monday, Wednesday, and  Friday. Patient taking differently: Take 40 mg by mouth 2 (two) times daily as needed for edema or fluid. 05/14/16   Dixie Dials, MD  losartan (COZAAR) 25 MG tablet Take 25 mg by mouth daily. 04/27/16   [provider]  meclizine (ANTIVERT) 12.5 MG tablet Take 25 mg by mouth 2 (two) times daily. 01/12/21   [provider]  metFORMIN (GLUCOPHAGE) 500 MG tablet Take 500 mg by mouth 2 (two) times daily with a meal.    [provider]  pantoprazole (PROTONIX) 40 MG tablet Take 40 mg by mouth daily. 10/16/20   [provider]      Allergies    Clopidogrel bisulfate    Review of Systems   Review of Systems  Neurological:  Positive for dizziness. Negative for headaches.  All other systems reviewed and are negative.  Physical Exam Updated Vital Signs BP (!) 145/70    Pulse 76    Temp 98.3 F (36.8 C)    Resp (!) 22    SpO2 97%  Physical Exam Vitals reviewed.  Constitutional:      Appearance: Normal appearance.  HENT:     Head: Normocephalic.     Right Ear: Tympanic membrane normal.     Left Ear: Tympanic membrane normal.     Nose: Nose normal.     Mouth/Throat:     Mouth: Mucous membranes are  moist.  Eyes:     Extraocular Movements: Extraocular movements intact.     Pupils: Pupils are equal, round, and reactive to light.  Cardiovascular:     Rate and Rhythm: Normal rate.  Pulmonary:     Effort: Pulmonary effort is normal.  Musculoskeletal:        General: Normal range of motion.     Cervical back: Normal range of motion.  Skin:    General: Skin is warm.  Neurological:     General: No focal deficit present.     Mental Status: She is alert and oriented to person, place, and time.     Comments: Difficulty speaking   Psychiatric:        Mood and Affect: Mood normal.        Behavior: Behavior normal.    ED Results / Procedures / Treatments   Labs (all labs ordered are listed, but only abnormal results are displayed) Labs Reviewed   COMPREHENSIVE METABOLIC PANEL - Abnormal; Notable for the following components:      Result Value   Glucose, Bld 112 (*)    Creatinine, Ser 1.15 (*)    Calcium 11.2 (*)    GFR, Estimated 51 (*)    All other components within normal limits  HEMOGLOBIN A1C - Abnormal; Notable for the following components:   Hgb A1c MFr Bld 7.2 (*)    All other components within normal limits  RESP PANEL BY RT-PCR (FLU A&B, COVID) ARPGX2  CBC WITH DIFFERENTIAL/PLATELET  LIPID PANEL    EKG None  Radiology No results found.  Procedures Procedures    Medications Ordered in ED Medications - No data to display  ED Course/ Medical Decision Making/ A&P                           Medical Decision Making Pt has increasing vertigo and difficulty speaking since visit on 1/6.  I spoke with MRi tch who reports pt can not have MRi due to device.  I discussed pt with Dr. Cheral Marker Neurology who consulted and advised admission for further evaluation.    Amount and/or Complexity of Data Reviewed Independent Historian: spouse Labs: ordered. Radiology: ordered.    Details: Ct head and ct angio ordered  Risk Decision regarding hospitalization.           Final Clinical Impression(s) / ED Diagnoses Final diagnoses:  Vertigo  Cerebrovascular accident (CVA), unspecified mechanism [facility])    Rx / [place] Orders ED Discharge Orders     None         Sidney Ace 01/13/21 1411    Valarie Merino, MD 01/15/21 1455

## 2021-01-13 NOTE — ED Notes (Signed)
Echo at bedside

## 2021-01-13 NOTE — Assessment & Plan Note (Addendum)
Appears euvolemic on exam Monitor I/O Do not have last echo with her cardiologist Continue digoxin, cozaar, coreg. On lasix prn  -Biv-ICD interrogated on 01/12/21 at EP appointment.

## 2021-01-13 NOTE — Progress Notes (Signed)
SLP Cancellation Note  Patient Details Name: Shelley Douglas MRN: [id number] DOB: 08-Aug-1950   Cancelled treatment:       Reason Eval/Treat Not Completed: Patient at procedure or test/unavailable   Osie Bond., M.A. [facility] Pager [phone] Office [phone]  01/13/2021, 4:06 PM

## 2021-01-13 NOTE — ED Notes (Addendum)
Patient returns from scan. Denies any dizziness or any symptoms at this time.

## 2021-01-13 NOTE — Assessment & Plan Note (Addendum)
71 year old presenting with symptoms concerning for stroke including dizziness, visual deficits in her left eye and dysarthric speech x 5-6 days Risk factors for stroke include previous CVA, atrial fibrillation, HTN, HLD (uncontrolled), T2DM, age/sex  -place in observation for stroke/TIA work up -dizziness only with sitting up, dix hallpike negative. Check orthostatics. Meclizine prn  -can not have MRI due to Biv-ICD. CTA head/neck  -Neurochecks per protocol -Neurology consulted-f/u on recs.  -echo  -a1c: 7.2 and LDL of 195 (increase lipitor vs. Change to crestor, likely needs second agent vs. PCSK9i) -Continue daily aspirin 81 mg and eliquis  -continue blood pressure control since 5+ days since symptom onset.  -N.p.o. until bedside swallow screen -ST BSE due to new onset coughing after she swallows , has good laryngeal elevation on exam  -PT/ OT/ SLP consult

## 2021-01-13 NOTE — Assessment & Plan Note (Signed)
A1c of 7.2 today, with concerns for stroke would like this less than 7.0.  Hold metformin and will place her on SSI with routine accuchecks  Adjust medication/diet to get a1c to goal

## 2021-01-13 NOTE — ED Notes (Signed)
Unable to establish an IV. Attempted twice with no success. 2nd IV infiltrated in CT. Pt needs CT angio. IV team consult in place.

## 2021-01-13 NOTE — H&P (Addendum)
History and Physical    Shelley Douglas CXK:[id number] DOB: 18-Jun-1950 DOA: 01/12/2021  PCP: Elwyn Reach, MD Consultants:  cardiology: Dr. Doylene Canard, EP: DR. Allred  Patient coming from:  Home - lives with her husband and son  Chief Complaint: dizziness   HPI: Shelley Douglas is a 71 y.o. female with medical history significant of systolic CHF and ischemic cardiomyopathy with BiV-ICD, afib on eliquis, hx of PE, HLD, HTN, hx of CVA and t2DM who presented to ED with complaints of dizziness, visual changes and speaking deficits. She was seen on 1/6 here in ED for dizziness and trouble walking with intermittent left visual field deficits (blurry vision) and slurred speech that started on Thursday morning.  She states she was bending over and when she sat upright she had the dizziness, vision changes and slurred speech. Her speech remained slurred and they came to ED on 1/6. She did have head CT done at that time which was negative for acute findings, but left AMA due to prolonged wait times. After she went home she continued to have dizziness that impairs her walking and balance and she has to hold on to things as well as slurred speech. She states her vision has been okay until she got here. She states that she sees things moving, but they are not. Only in her left eye. She denies any weakness or loss of sensation. She denies any facial drooping.   She only gets dizzy when she bends over. She describes this as she is spinning and the world is standing still. Lasts about one minute and goes away with lying still. She isn't sure if turning head side to side makes It worse. She has no ear pain or deceased hearing loss. Had tinnitus in her left ear this morning that has resolved. Blood pressure readings have not been low.   She missed her eliquis dose Monday morning.   She overall has been feeling fine. Denies any fever/chills, no headaches, no chest pain, occasional palpitations, she has some  shortness of breath and a cough after she swallows which is new for her (only when she eats something), no stomach pain, no N/V/D, no dysuria, leg swelling slightly worse than baseline.    ED Course: vitals: afebrile, bp: 143/69, HR: 76, RR: 16, oxygen: 100% RA Pertinent labs: creatinine 1.15, calcium: 11.2, a1c: 7.2, lipid panel LDL-195, TC: 280  In ED: neurology consulted, CTA ordered. TRH asked to admit.    Review of Systems: As per HPI; otherwise review of systems reviewed and negative.    Ambulatory Status:  Ambulates without assistance   Past Medical History:  Diagnosis Date   AICD (automatic cardioverter/defibrillator) present    Anxiety    pt denies this hx on 05/12/2016   CAD (coronary artery disease)    last cath 2016   Daily headache    DVT (deep venous thrombosis) ([place]) 1996   High cholesterol    History of blood transfusion    "related to pregnancy" (05/12/2016)   Hypertension    "just started taking RX" (05/12/2016)   Ischemic cardiomyopathy    Left bundle branch block    Mitral valve regurgitation    Myocardial infarction ([place]) ~ 2009 X2   Obesity    Persistent atrial fibrillation ([place])    Pulmonary embolism ([place]) 1993   Seizures ([place]) 1970s   "I had 1; don't know what it was from" (05/12/2016)   Stroke [facility]) 2001   denies residual on 05/12/2016  Type II diabetes mellitus ([place])     Past Surgical History:  Procedure Laterality Date   BILATERAL CARPAL TUNNEL RELEASE Bilateral 1990s   CARDIAC CATHETERIZATION N/A 09/27/2014   Procedure: Left Heart Cath and Coronary Angiography;  Surgeon: Dixie Dials, MD;  Location: [facility];  Service: Cardiovascular;  Laterality: N/A;   CARDIAC DEFIBRILLATOR PLACEMENT  07/24/2007   Medtronic Secura DR implanted in [place] for primary prevention   CORONARY ANGIOPLASTY WITH STENT PLACEMENT  ~ 2009   CORONARY ARTERY BYPASS GRAFT  ~ 2009   DILATION AND CURETTAGE OF UTERUS     EP IMPLANTABLE DEVICE N/A 07/01/2015   BiV ICD  upgrade to Medtronic Viva Quad XT CRT-D device for primary prevention   EXTERNAL EAR SURGERY Right 1990   "skin grafted from my right thigh and put on my right ear"   HERNIA REPAIR  1990's    "stomach"   TUBAL LIGATION     [place]    Social History   Socioeconomic History   Marital status: Married    Spouse name: Not on file   Number of children: Not on file   Years of education: Not on file   Highest education level: Not on file  Occupational History   Occupation: disabled  Tobacco Use   Smoking status: Former    Years: 4.00    Types: Cigarettes    Quit date: 01/05/1988    Years since quitting: 33.0   Smokeless tobacco: Never   Tobacco comments:    05/12/2016 "someday smoker when I did smoked; never smoked much"  Vaping Use   Vaping Use: Never used  Substance and Sexual Activity   Alcohol use: No   Drug use: No   Sexual activity: Not Currently  Other Topics Concern   Not on file  Social History Narrative   Pt lives in [place] with spouse.   Disabled   Social Determinants of Health   Financial Resource Strain: Not on file  Food Insecurity: Not on file  Transportation Needs: Not on file  Physical Activity: Not on file  Stress: Not on file  Social Connections: Not on file  Intimate Partner Violence: Not on file    Allergies  Allergen Reactions   Clopidogrel Bisulfate Rash    Family History  Problem Relation Age of Onset   Hypertension Unknown     Prior to Admission medications   Medication Sig Start Date End Date Taking? Authorizing Provider  acetaminophen (TYLENOL) 650 MG CR tablet Take 650 mg by mouth every 8 (eight) hours as needed for pain.    [provider]  amiodarone (PACERONE) 200 MG tablet Take 1 tablet (200 mg total) by mouth daily. 01/04/15   Dixie Dials, MD  amLODipine (NORVASC) 5 MG tablet Take 5 mg by mouth daily.    [provider]  apixaban (ELIQUIS) 5 MG TABS tablet Take 1 tablet (5 mg total)  by mouth 2 (two) times daily. 05/14/16   Dixie Dials, MD  aspirin EC 81 MG tablet Take 81 mg by mouth daily. Swallow whole.    [provider]  atorvastatin (LIPITOR) 40 MG tablet Take 40 mg by mouth daily.    [provider]  carvedilol (COREG) 25 MG tablet Take 25 mg by mouth 2 (two) times daily. 11/25/17   [provider]  Cholecalciferol (VITAMIN D3 PO) Take 1 capsule by mouth daily.    [provider]  digoxin (LANOXIN) 0.125 MG tablet  Take 0.0625 mg by mouth daily. 11/25/17   [provider]  folic acid (FOLVITE) 1 MG tablet Take 1 mg by mouth daily.     [provider]  furosemide (LASIX) 40 MG tablet Take 1 tablet (40 mg total) by mouth every Monday, Wednesday, and Friday. Patient taking differently: Take 40 mg by mouth 2 (two) times daily as needed for edema or fluid. 05/14/16   Dixie Dials, MD  losartan (COZAAR) 25 MG tablet Take 25 mg by mouth daily. 04/27/16   [provider]  meclizine (ANTIVERT) 12.5 MG tablet Take 25 mg by mouth 2 (two) times daily. 01/12/21   [provider]  metFORMIN (GLUCOPHAGE) 500 MG tablet Take 500 mg by mouth 2 (two) times daily with a meal.    [provider]  pantoprazole (PROTONIX) 40 MG tablet Take 40 mg by mouth daily. 10/16/20   [provider]    Physical Exam: Vitals:   01/13/21 1000 01/13/21 1030 01/13/21 1130 01/13/21 1200  BP: (!) 148/76 135/66 (!) 145/70 136/61  Pulse: 69 68 76 80  Resp: 15 20 (!) 22 (!) 25  Temp:      TempSrc:      SpO2: 96% 98% 97% 98%     General:  Appears calm and comfortable and is in NAD Eyes:  PERRL, EOMI, normal lids, iris ENT:  grossly normal hearing, lips & tongue, mmm; appropriate dentition. TM pearly with light reflex bilaterally  Neck:  no LAD, masses or thyromegaly; no carotid bruits Cardiovascular:  RRR, no m/r/g. Trace LE edema.  Respiratory:   CTA bilaterally with no wheezes/rales/rhonchi.  Normal respiratory  effort. Abdomen:  soft, NT, ND, NABS Back:   normal alignment, no CVAT Skin:  no rash or induration seen on limited exam Musculoskeletal:  grossly normal tone BUE/BLE, good ROM, no bony abnormality Lower extremity: Limited foot exam with no ulcerations.  2+ distal pulses. Psychiatric:  grossly normal mood and affect, speech fluent and appropriate, Aox3. No dysarthria noted on exam.  Neurologic:  CN 2-12 grossly intact, moves all extremities in coordinated fashion, sensation intact. Negative pronator drift. HTK intact bilaterally, FTN intact bilaterally, DTR 2+. Gait deferred. Dix-hallpike negative bilaterally     Radiological Exams on Admission: Independently reviewed - see discussion in A/P where applicable  No results found.  EKG: Independently reviewed.  NSR with rate 80, ventricular paced; nonspecific ST changes with no evidence of acute ischemia   Labs on Admission: I have personally reviewed the available labs and imaging studies at the time of the admission.  Pertinent labs:  : creatinine 1.15,  calcium: 11.2,  a1c: 7.2,  lipid panel LDL-195, TC: 280  Assessment/Plan * Stroke-like symptoms with dizziness, visual deficits and difficulty speaking - (present on admission) 71 year old presenting with symptoms concerning for stroke including dizziness, visual deficits in her left eye and dysarthric speech x 5-6 days Risk factors for stroke include previous CVA, atrial fibrillation, HTN, HLD (uncontrolled), T2DM, age/sex  -place in observation for stroke/TIA work up -dizziness only with sitting up, dix hallpike negative. Check orthostatics. Meclizine prn  -can not have MRI due to Biv-ICD. CTA head/neck  -Neurochecks per protocol -Neurology consulted-f/u on recs.  -echo  -a1c: 7.2 and LDL of 195 (increase lipitor vs. Change to crestor, likely needs second agent vs. PCSK9i) -Continue daily aspirin 81 mg and eliquis  -continue blood pressure control since 5+ days since symptom  onset.  -N.p.o. until bedside swallow screen -ST BSE due to new  onset coughing after she swallows , has good laryngeal elevation on exam  -PT/ OT/ SLP consult   Type 2 diabetes mellitus without complication (HCC) D4K of 7.2 today, with concerns for stroke would like this less than 7.0.  Hold metformin and will place her on SSI with routine accuchecks  Adjust medication/diet to get a1c to goal   Atrial fibrillation ([place])- (present on admission) V paced rhythm cha2ds2-vasc of 8 Continue eliquis/amiodarone/digoxin Telemetry   Essential hypertension- (present on admission) Decently well controlled Stroke like symptoms have been going on for 5+days, continue to treat HTN Continue home medication: cozaar 25mg , coreg 25mg  bid, norvasc 5mg  and lasix 40mg  prn  Chronic systolic congestive heart failure with BiV-ICD- (present on admission) Appears euvolemic on exam Monitor I/O Do not have last echo with her cardiologist Continue digoxin, cozaar, coreg. On lasix prn  -Biv-ICD interrogated on 01/12/21 at EP appointment.   CAD (coronary artery disease)/HLD- (present on admission) Stable, continue medical management with ASA, coreg and lipitor  LDL of 195, change to crestor or may be candidate for PCSK9 inhibitor.   Hypercalcemia- (present on admission) Repeat tomorrow, if continues to be elevated will need hypercalcemia w/u     There is no height or weight on file to calculate BMI.   Level of care: Telemetry Medical DVT prophylaxis:  eliquis  Code Status:  Full - confirmed with patient Family Communication: None present; I attempted to speak with the patient's husband by telephone at the time of admission, but unable to leave VM.  Disposition Plan:  The patient is from: home  Anticipated d/c is to: home    Patient placed in observation as anticipate less than 2 midnight stay. Requires hospitalization for stroke work up and is at risk of neurological worsening. Requires frequent  monitoring and assessment and MDM with specialists.     Patient is currently: stable Consults called: neurology: Dr. Cheral Marker   Admission status:  observation    Orma Flaming MD [facility]   How to contact the [facility] Attending or Consulting provider [place] or covering provider during after hours [place], for this patient?  Check the care team in [facility] and look for a) attending/consulting TRH provider listed and b) the [facility] team listed Log into [email or web address] and use [place]'s universal password to access. If you do not have the password, please contact the hospital operator. Locate the [facility] provider you are looking for under [facility] and page to a number that you can be directly reached. If you still have difficulty reaching the provider, please page the [facility] (Director on Call) for the Hospitalists listed on amion for assistance.   01/13/2021, 1:39 PM

## 2021-01-14 ENCOUNTER — Other Ambulatory Visit: Payer: Self-pay

## 2021-01-14 ENCOUNTER — Observation Stay (HOSPITAL_COMMUNITY): Payer: Medicare Other

## 2021-01-14 ENCOUNTER — Encounter (HOSPITAL_COMMUNITY): Payer: Self-pay | Admitting: Family Medicine

## 2021-01-14 DIAGNOSIS — R42 Dizziness and giddiness: Secondary | ICD-10-CM | POA: Diagnosis not present

## 2021-01-14 DIAGNOSIS — I6330 Cerebral infarction due to thrombosis of unspecified cerebral artery: Secondary | ICD-10-CM | POA: Insufficient documentation

## 2021-01-14 DIAGNOSIS — R2990 Unspecified symptoms and signs involving the nervous system: Secondary | ICD-10-CM | POA: Diagnosis not present

## 2021-01-14 DIAGNOSIS — I639 Cerebral infarction, unspecified: Secondary | ICD-10-CM | POA: Diagnosis not present

## 2021-01-14 LAB — BASIC METABOLIC PANEL
Anion gap: 9 (ref 5–15)
BUN: 12 mg/dL (ref 8–23)
CO2: 24 mmol/L (ref 22–32)
Calcium: 10.1 mg/dL (ref 8.9–10.3)
Chloride: 99 mmol/L (ref 98–111)
Creatinine, Ser: 0.86 mg/dL (ref 0.44–1.00)
GFR, Estimated: >60 mL/min (ref >60)
Glucose, Bld: 118 mg/dL — ABNORMAL HIGH (ref 70–99)
Potassium: 3.9 mmol/L (ref 3.5–5.1)
Sodium: 132 mmol/L — ABNORMAL LOW (ref 135–145)

## 2021-01-14 LAB — GLUCOSE, CAPILLARY
Glucose-Capillary: 136 mg/dL — ABNORMAL HIGH (ref 70–99)
Glucose-Capillary: 154 mg/dL — ABNORMAL HIGH (ref 70–99)
Glucose-Capillary: 191 mg/dL — ABNORMAL HIGH (ref 70–99)

## 2021-01-14 LAB — HIV ANTIBODY (ROUTINE TESTING W REFLEX): HIV Screen 4th Generation wRfx: NONREACTIVE

## 2021-01-14 MED ORDER — ASPIRIN EC 81 MG PO TBEC
81.0000 mg | DELAYED_RELEASE_TABLET | Freq: Every day | ORAL | Status: DC
  Administered 2021-01-14: 81 mg via ORAL
  Filled 2021-01-14: qty 1

## 2021-01-14 MED ORDER — ATORVASTATIN CALCIUM 80 MG PO TABS
80.0000 mg | ORAL_TABLET | Freq: Every day | ORAL | Status: DC
  Administered 2021-01-14: 80 mg via ORAL
  Filled 2021-01-14: qty 1

## 2021-01-14 MED ORDER — STROKE: EARLY STAGES OF RECOVERY BOOK
Status: AC
  Filled 2021-01-14: qty 1

## 2021-01-14 MED ORDER — IOHEXOL 350 MG/ML SOLN
75.0000 mL | Freq: Once | INTRAVENOUS | Status: AC | PRN
  Administered 2021-01-14: 75 mL via INTRAVENOUS

## 2021-01-14 MED ORDER — INFLUENZA VAC A&B SA ADJ QUAD 0.5 ML IM PRSY: 0.5000 mL | PREFILLED_SYRINGE | INTRAMUSCULAR | Status: DC

## 2021-01-14 MED ORDER — ASPIRIN EC 81 MG PO TBEC: 81.0000 mg | DELAYED_RELEASE_TABLET | Freq: Every day | ORAL | 1 refills | Status: DC

## 2021-01-14 MED ORDER — ATORVASTATIN CALCIUM 80 MG PO TABS: 80.0000 mg | ORAL_TABLET | Freq: Every day | ORAL | 1 refills | Status: DC

## 2021-01-14 NOTE — Evaluation (Signed)
Clinical/Bedside Swallow Evaluation Patient Details  Name: Shelley Douglas MRN: [id number] Date of Birth: Jan 13, 1950  Today's Date: 01/14/2021 Time: SLP Start Time (ACUTE ONLY): 0956 SLP Stop Time (ACUTE ONLY): 1004 SLP Time Calculation (min) (ACUTE ONLY): 8 min  Past Medical History:  Past Medical History:  Diagnosis Date   AICD (automatic cardioverter/defibrillator) present    Anxiety    pt denies this hx on 05/12/2016   CAD (coronary artery disease)    last cath 2016   Daily headache    DVT (deep venous thrombosis) ([place]) 1996   High cholesterol    History of blood transfusion    "related to pregnancy" (05/12/2016)   Hypertension    "just started taking RX" (05/12/2016)   Ischemic cardiomyopathy    Left bundle branch block    Mitral valve regurgitation    Myocardial infarction ([place]) ~ 2009 X2   Obesity    Persistent atrial fibrillation ([place])    Pulmonary embolism ([place]) 1993   Seizures ([place]) 1970s   "I had 1; don't know what it was from" (05/12/2016)   Stroke [facility]) 2001   denies residual on 05/12/2016   Type II diabetes mellitus ([place])    Past Surgical History:  Past Surgical History:  Procedure Laterality Date   BILATERAL CARPAL TUNNEL RELEASE Bilateral 1990s   CARDIAC CATHETERIZATION N/A 09/27/2014   Procedure: Left Heart Cath and Coronary Angiography;  Surgeon: Dixie Dials, MD;  Location: [facility];  Service: Cardiovascular;  Laterality: N/A;   CARDIAC DEFIBRILLATOR PLACEMENT  07/24/2007   Medtronic Secura DR implanted in [place] for primary prevention   CORONARY ANGIOPLASTY WITH STENT PLACEMENT  ~ 2009   CORONARY ARTERY BYPASS GRAFT  ~ 2009   DILATION AND CURETTAGE OF UTERUS     EP IMPLANTABLE DEVICE N/A 07/01/2015   BiV ICD upgrade to Medtronic [employer] XT CRT-D device for primary prevention   EXTERNAL EAR SURGERY Right 1990   "skin grafted from my right thigh and put on my right ear"   HERNIA REPAIR  1990's    "stomach"   [place]   HPI:  [name] who presented to ED with complaints of dizziness, visual changes and speaking deficits. CT head 01/09/21 neg for acute findings. [place] 01/13/21. Per MD note 1/10 "she has some shortness of breath and a cough after she swallows which is new for her (only when she eats something)". PMH: systolic CHF and ischemic cardiomyopathy with BiV-ICD, afib on eliquis, hx of PE, HLD, HTN, hx of CVA and t2DM.    Assessment / Plan / Recommendation  Clinical Impression  Pt presents with functional oropharyngeal swallow per clinical evaluation. Oral mechanism examination unremarkable and pt wears upper dentures with some noted missing dentition, but adequate for PO intake. Thin liquids via consecutive straw sips x2, bites of puree and regular textures were without overt s/sx of aspiration. Solids orally cleared. Recommend continue regular, thin liquid diet with adherence to universal swallow precutions. No further SLP f/u warranted at this time, will s/o.  SLP Visit Diagnosis: Dysphagia, unspecified (R13.10)    Aspiration Risk  No limitations    Diet Recommendation Regular;Thin liquid   Liquid Administration via: Cup;Straw Medication Administration: Whole meds with liquid Supervision: Patient able to self feed Compensations: Slow rate;Small sips/bites Postural Changes: Seated upright at 90 degrees    Other  Recommendations Oral Care Recommendations: Oral care  BID    Recommendations for follow up therapy are one component of a multi-disciplinary discharge planning process, led by the attending physician.  Recommendations may be updated based on patient status, additional functional criteria and insurance authorization.  Follow up Recommendations No SLP follow up      Assistance Recommended at Discharge PRN  Functional Status Assessment Patient has not had a recent decline in their functional status  Frequency and Duration             Prognosis Prognosis for Safe Diet Advancement: Good      Swallow Study   General Date of Onset: 01/13/21 HPI: [name] who presented to ED with complaints of dizziness, visual changes and speaking deficits. CT head 01/09/21 neg for acute findings. [place] 01/13/21. Per MD note 1/10 "she has some shortness of breath and a cough after she swallows which is new for her (only when she eats something)". PMH: systolic CHF and ischemic cardiomyopathy with BiV-ICD, afib on eliquis, hx of PE, HLD, HTN, hx of CVA and t2DM. Type of Study: Bedside Swallow Evaluation Previous Swallow Assessment: none per EMR Diet Prior to this Study: Regular;Thin liquids Temperature Spikes Noted: No Respiratory Status: Room air History of Recent Intubation: No Behavior/Cognition: Alert;Cooperative;Pleasant mood Oral Cavity Assessment: Within Functional Limits Oral Care Completed by SLP: No Oral Cavity - Dentition: Dentures, top;Adequate natural dentition;Missing dentition Vision: Functional for self-feeding Self-Feeding Abilities: Able to feed self Patient Positioning: Upright in bed;Postural control adequate for testing Baseline Vocal Quality: Low vocal intensity Volitional Cough: Strong Volitional Swallow: Able to elicit    Oral/Motor/Sensory Function Overall Oral Motor/Sensory Function: Within functional limits   Ice Chips Ice chips: Not tested   Thin Liquid Thin Liquid: Within functional limits Presentation: Self Fed;Straw    Nectar Thick Nectar Thick Liquid: Not tested   Honey Thick Honey Thick Liquid: Not tested   Puree Puree: Within functional limits Presentation: Self Fed;Spoon   Solid      Solid: Within functional limits Presentation: O'Donnell, [place], [place] Office Number: [place] 01/14/2021,10:35 AM

## 2021-01-14 NOTE — Evaluation (Signed)
Occupational Therapy Evaluation Patient Details Name: Shelley Douglas MRN: [id number] DOB: Nov 06, 1950 Today's Date: 01/14/2021   History of Present Illness Shelley Douglas is a 71 y.o. female who presented to ED with complaints of dizziness, visual changes and speaking deficits. CT demonstrated chronic ischemic infarcts involving the right frontoparietal region, left parietal lobe, and right thalamus. Pt with medical history significant of systolic CHF and ischemic cardiomyopathy with BiV-ICD, afib on eliquis, hx of PE, HLD, HTN, hx of CVA and t2DM   Clinical Impression   Shelley Douglas reports being generally indep PTA, no use of AD but does not drive. She lives in a 1 level home, 4-5 STE, with her husband who can assist as needed. Upon evaluation pt demonstrated supervision level bed mobility and transfers, and denied any dizziness. She was min guard for OOB transfers and ambulation without int room, and reported mild dizziness after standing that resolved with standing rest break. Pt required up to min A for ADLs this date. Pt's visual assessment was [facility] however she admits she has had L visual field impairments associated with the dizziness. Vision to be further assessed. She will benefit from OT acutely to progress towards her indep baseline, anticipate pt progress well acutely however pt will benefit from OP neuro follow up based on performance this date. If pt progresses to indep baseline, recommend pt d/c home without follow up.      Recommendations for follow up therapy are one component of a multi-disciplinary discharge planning process, led by the attending physician.  Recommendations may be updated based on patient status, additional functional criteria and insurance authorization.   Follow Up Recommendations  Outpatient OT (& follow up wtih eye doctor)    Assistance Recommended at Discharge Frequent or constant Supervision/Assistance  Patient can return home with the following A little  help with bathing/dressing/bathroom;Assistance with cooking/housework;Assist for transportation;Help with stairs or ramp for entrance    Functional Status Assessment  Patient has had a recent decline in their functional status and demonstrates the ability to make significant improvements in function in a reasonable and predictable amount of time.  Equipment Recommendations  None recommended by OT       Precautions / Restrictions Precautions Precautions: Fall Restrictions Weight Bearing Restrictions: No      Mobility Bed Mobility Overal bed mobility: Needs Assistance Bed Mobility: Supine to Sit     Supine to sit: Supervision     General bed mobility comments: incrased time, no phyiscal assist needed. denies dizziness with this change in position    Transfers Overall transfer level: Needs assistance Equipment used: 1 person hand held assist;None Transfers: Sit to/from Stand Sit to Stand: Supervision           General transfer comment: no physical assist needed. Pt reports mild dizziness upon standing.      Balance Overall balance assessment: Needs assistance Sitting-balance support: Feet supported       Standing balance support: No upper extremity supported;During functional activity Standing balance-Leahy Scale: Fair                             ADL either performed or assessed with clinical judgement   ADL Overall ADL's : Needs assistance/impaired Eating/Feeding: Independent   Grooming: Min guard;Standing   Upper Body Bathing: Set up;Sitting   Lower Body Bathing: Minimal assistance;Sit to/from stand   Upper Body Dressing : Set up;Sitting   Lower Body Dressing: Minimal assistance;Sit to/from stand  Toilet Transfer: Min guard;Ambulation   Toileting- Clothing Manipulation and Hygiene: Supervision/safety;Sitting/lateral lean       Functional mobility during ADLs: Min guard General ADL Comments: Min A for lower body tasks due to dizziness  exacerbation     Vision Baseline Vision/History: 1 Wears glasses Ability to See in Adequate Light: 0 Adequate Patient Visual Report: No change from baseline Vision Assessment?: Vision impaired- to be further tested in functional context;Yes Eye Alignment: Within Functional Limits Ocular Range of Motion: Within Functional Limits Alignment/Gaze Preference: Within Defined Limits Tracking/Visual Pursuits: Able to track stimulus in all quads without difficulty Saccades: Within functional limits Convergence: Within functional limits Visual Fields: Impaired-to be further tested in functional context Additional Comments: Pt's vision seems [facility] upon assessment. However pt reports that she has intermittent L visual field changes, exacerbated when she is feeling dizzy.            Pertinent Vitals/Pain Pain Assessment: Faces Faces Pain Scale: No hurt     Hand Dominance Right   Extremity/Trunk Assessment Upper Extremity Assessment Upper Extremity Assessment: Generalized weakness (R grip strength > L. But overall WFL.)   Lower Extremity Assessment Lower Extremity Assessment: Defer to PT evaluation   Cervical / Trunk Assessment Cervical / Trunk Assessment: Normal   Communication Communication Communication: No difficulties   Cognition Arousal/Alertness: Awake/alert Behavior During Therapy: WFL for tasks assessed/performed Overall Cognitive Status: Within Functional Limits for tasks assessed         General Comments: reviewed BE FAST, pt verbalized understanding     General Comments  VSS on RA, no family present     Home Living Family/patient expects to be discharged to:: Private residence Living Arrangements: Spouse/significant other Available Help at Discharge: Family;Available 24 hours/day Type of Home: House Home Access: Stairs to enter [employer] of Steps: 4-5 Entrance Stairs-Rails: Right;Left Home Layout: One level     Bathroom Shower/Tub: Arts administrator: Standard     Home Equipment: Conservation officer, nature (2 wheels);Cane - single point;BSC/3in1;Shower seat   Additional Comments: pt states the DME is her husbands  Lives With: Spouse    Prior Functioning/Environment Prior Level of Function : Independent/Modified Independent             Mobility Comments: no AD ADLs Comments: indep        OT Problem List: Decreased strength;Decreased range of motion;Decreased activity tolerance;Impaired balance (sitting and/or standing);Decreased safety awareness;Decreased knowledge of use of DME or AE;Decreased knowledge of precautions;Pain      OT Treatment/Interventions: Self-care/ADL training;Therapeutic exercise;Therapeutic activities;Patient/family education;Balance training;DME and/or AE instruction    OT Goals(Current goals can be found in the care plan section) Acute Rehab OT Goals Patient Stated Goal: home OT Goal Formulation: With patient Time For Goal Achievement: 01/28/21 Potential to Achieve Goals: Good ADL Goals Pt/caregiver will Perform Home Exercise Program: Increased ROM;Increased strength;Both right and left upper extremity;With written HEP provided Additional ADL Goal #1: Pt will complete all BADLs independently Additional ADL Goal #2: Pt will indep verbalize at least 3 fall prevention strategies  OT Frequency: Min 2X/week       AM-PAC OT "6 Clicks" Daily Activity     Outcome Measure Help from another person eating meals?: None Help from another person taking care of personal grooming?: A Little Help from another person toileting, which includes using toliet, bedpan, or urinal?: A Little Help from another person bathing (including washing, rinsing, drying)?: A Little Help from another person to put on and taking off regular upper  body clothing?: None Help from another person to put on and taking off regular lower body clothing?: A Little 6 Click Score: 20   End of Session Equipment Utilized During  Treatment: Gait belt Nurse Communication: Mobility status  Activity Tolerance: Patient tolerated treatment well Patient left: in chair;with call bell/phone within reach  OT Visit Diagnosis: Unsteadiness on feet (R26.81);Other abnormalities of gait and mobility (R26.89);Pain                Time: 8588-5027 OT Time Calculation (min): 22 min Charges:  OT General Charges $OT Visit: 1 Visit OT Evaluation $OT Eval Moderate Complexity: 1 Mod  Laken Rog A Niva Murren 01/14/2021, 11:24 AM

## 2021-01-14 NOTE — Plan of Care (Signed)
°  Problem: Education: Goal: Knowledge of General Education information will improve Description: Including pain rating scale, medication(s)/side effects and non-pharmacologic comfort measures Outcome: Adequate for Discharge   Problem: Health Behavior/Discharge Planning: Goal: Ability to manage health-related needs will improve Outcome: Adequate for Discharge   Problem: Clinical Measurements: Goal: Ability to maintain clinical measurements within normal limits will improve Outcome: Adequate for Discharge Goal: Will remain free from infection Outcome: Adequate for Discharge Goal: Diagnostic test results will improve Outcome: Adequate for Discharge Goal: Respiratory complications will improve Outcome: Adequate for Discharge Goal: Cardiovascular complication will be avoided Outcome: Adequate for Discharge   Problem: Activity: Goal: Risk for activity intolerance will decrease Outcome: Adequate for Discharge   Problem: Nutrition: Goal: Adequate nutrition will be maintained Outcome: Adequate for Discharge   Problem: Coping: Goal: Level of anxiety will decrease Outcome: Adequate for Discharge   Problem: Elimination: Goal: Will not experience complications related to bowel motility Outcome: Adequate for Discharge Goal: Will not experience complications related to urinary retention Outcome: Adequate for Discharge   Problem: Pain Managment: Goal: General experience of comfort will improve Outcome: Adequate for Discharge   Problem: Safety: Goal: Ability to remain free from injury will improve Outcome: Adequate for Discharge   Problem: Skin Integrity: Goal: Risk for impaired skin integrity will decrease Outcome: Adequate for Discharge   Problem: Education: Goal: Knowledge of disease or condition will improve Outcome: Adequate for Discharge Goal: Knowledge of secondary prevention will improve (SELECT ALL) Outcome: Adequate for Discharge Goal: Knowledge of patient specific  risk factors will improve (INDIVIDUALIZE FOR PATIENT) Outcome: Adequate for Discharge Goal: Individualized Educational Video(s) Outcome: Adequate for Discharge   Problem: Coping: Goal: Will verbalize positive feelings about self Outcome: Adequate for Discharge   Problem: Health Behavior/Discharge Planning: Goal: Ability to manage health-related needs will improve Outcome: Adequate for Discharge   Problem: Self-Care: Goal: Ability to participate in self-care as condition permits will improve Outcome: Adequate for Discharge Goal: Verbalization of feelings and concerns over difficulty with self-care will improve Outcome: Adequate for Discharge   Problem: Ischemic Stroke/TIA Tissue Perfusion: Goal: Complications of ischemic stroke/TIA will be minimized Outcome: Adequate for Discharge   Problem: Education: Goal: Knowledge of disease or condition will improve Outcome: Adequate for Discharge Goal: Knowledge of secondary prevention will improve (SELECT ALL) Outcome: Adequate for Discharge Goal: Knowledge of patient specific risk factors will improve (INDIVIDUALIZE FOR PATIENT) Outcome: Adequate for Discharge   Problem: Coping: Goal: Will verbalize positive feelings about self Outcome: Adequate for Discharge   Problem: Self-Care: Goal: Ability to participate in self-care as condition permits will improve Outcome: Adequate for Discharge   Problem: Education: Goal: Knowledge of secondary prevention will improve (SELECT ALL) Outcome: Adequate for Discharge   Problem: Education: Goal: Knowledge of patient specific risk factors will improve (INDIVIDUALIZE FOR PATIENT) Outcome: Adequate for Discharge   Problem: Education: Goal: Individualized Educational Video(s) Outcome: Adequate for Discharge   Problem: Ischemic Stroke/TIA Tissue Perfusion: Goal: Complications of ischemic stroke/TIA will be minimized Outcome: Adequate for Discharge   Problem: Education: Goal: Knowledge of  disease or condition will improve Outcome: Adequate for Discharge   Problem: Education: Goal: Knowledge of patient specific risk factors will improve (INDIVIDUALIZE FOR PATIENT) Outcome: Adequate for Discharge   Problem: Education: Goal: Knowledge of secondary prevention will improve (SELECT ALL) Outcome: Adequate for Discharge   Problem: Self-Care: Goal: Ability to participate in self-care as condition permits will improve Outcome: Adequate for Discharge

## 2021-01-14 NOTE — Evaluation (Signed)
Speech Language Pathology Evaluation Patient Details Name: Shelley Douglas MRN: [id number] DOB: [birth year] Today's Date: 01/14/2021 Time: 1005-1015 SLP Time Calculation (min) (ACUTE ONLY): 10 min  Problem List:  Patient Active Problem List   Diagnosis Date Noted   Stroke-like symptoms with dizziness, visual deficits and difficulty speaking  01/13/2021   Type 2 diabetes mellitus without complication ([place]) 89/21/1941   Essential hypertension 01/13/2021   CAD (coronary artery disease)/HLD 01/13/2021   Hypercalcemia 74/08/1446   Chronic systolic dysfunction of left ventricle 07/01/2015   Dizziness 09/24/2014   Ischemic cardiomyopathy 08/07/2010   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 ([place]) 01/29/2010   PULMONARY EMBOLISM 01/29/2010   VENTRICULAR TACHYCARDIA 01/29/2010   Atrial fibrillation ([place]) 18/56/3149   Chronic systolic congestive heart failure with BiV-ICD 01/29/2010   Cerebral artery occlusion with cerebral infarction ([place]) 01/29/2010   IMPLANTATION OF DEFIBRILLATOR, HX OF 01/29/2010   Past Medical History:  Past Medical History:  Diagnosis Date   AICD (automatic cardioverter/defibrillator) present    Anxiety    Shelley Douglas denies this hx on 05/12/2016   CAD (coronary artery disease)    last cath 2016   Daily headache    DVT (deep venous thrombosis) ([place]) 1996   High cholesterol    History of blood transfusion    "related to pregnancy" (05/12/2016)   Hypertension    "just started taking RX" (05/12/2016)   Ischemic cardiomyopathy    Left bundle branch block    Mitral valve regurgitation    Myocardial infarction ([place]) ~ 2009 X2   Obesity    Persistent atrial fibrillation ([place])    Pulmonary embolism ([place]) 1993   Seizures ([place]) 1970s   "I had 1; don't know what it was from" (05/12/2016)   Stroke [facility]) 2001   denies residual on 05/12/2016   Type II diabetes mellitus ([place])    Past Surgical History:  Past Surgical History:  Procedure Laterality Date   BILATERAL  CARPAL TUNNEL RELEASE Bilateral 1990s   CARDIAC CATHETERIZATION N/A 09/27/2014   Procedure: Left Heart Cath and Coronary Angiography;  Surgeon: Dixie Dials, MD;  Location: [place] [facility];  Service: Cardiovascular;  Laterality: N/A;   CARDIAC DEFIBRILLATOR PLACEMENT  07/24/2007   Medtronic [address]ura DR implanted in [place] for primary prevention   CORONARY ANGIOPLASTY WITH STENT PLACEMENT  ~ 2009   CORONARY ARTERY BYPASS GRAFT  ~ 2009   DILATION AND CURETTAGE OF UTERUS     EP IMPLANTABLE DEVICE N/A 07/01/2015   BiV ICD upgrade to [employer] XT CRT-D device for primary prevention   EXTERNAL EAR SURGERY Right 1990   "skin grafted from my right thigh and put on my right ear"   HERNIA REPAIR  1990's    "stomach"   [place]   HPI:  Shelley Douglas is a 71 y.o. female who presented to ED with complaints of dizziness, visual changes and speaking deficits. CT head 01/09/21 neg for acute findings. [place] 01/13/21. Per MD note 1/10 "she has some shortness of breath and a cough after she swallows which is new for her (only when she eats something)". PMH: systolic CHF and ischemic cardiomyopathy with BiV-ICD, afib on eliquis, hx of PE, HLD, HTN, hx of CVA and t2DM.   Assessment / Plan / Recommendation Clinical Impression  Shelley Douglas's motor speech, expressive/receptive language and cognitive functions are [facility] for given tasks presented during informal evaluation. She is able to answer complex y/n  questions and follow complex multi step commands with 100% accuracy. Speech at the conversation level is clear and without word finding/paraphasias, though note baseline low vocal intensity. She is oriented x4 and able to recall details related to medical w/u and previously performed swallow eval. She reports some STM deficits at baseline, but no changes since admission. Shelley Douglas reports she is at baseline function. No acute needs for SLP f/u noted at this time. Will  s/o.    SLP Assessment  SLP Recommendation/Assessment: Patient does not need any further [facility] SLP Visit Diagnosis: Cognitive communication deficit (R41.841)    Recommendations for follow up therapy are one component of a multi-disciplinary discharge planning process, led by the attending physician.  Recommendations may be updated based on patient status, additional functional criteria and insurance authorization.    Follow Up Recommendations  No SLP follow up    Assistance Recommended at Discharge  PRN  Functional Status Assessment Patient has not had a recent decline in their functional status  Frequency and Duration           SLP Evaluation Cognition  Overall Cognitive Status: Within Functional Limits for tasks assessed Arousal/Alertness: Awake/alert Orientation Level: Oriented X4 Year: 2023 Month: January Day of Week: Correct Attention: Sustained Sustained Attention: Appears intact Memory: Appears intact       Comprehension  Auditory Comprehension Overall Auditory Comprehension: Appears within functional limits for tasks assessed Yes/No Questions: Within Functional Limits Commands: Within Functional Limits Conversation: Complex Visual Recognition/Discrimination Discrimination: Not tested Reading Comprehension Reading Status: Not tested    Expression Expression Primary Mode of Expression: Verbal Verbal Expression Overall Verbal Expression: Appears within functional limits for tasks assessed Initiation: No impairment Automatic Speech: Name;Social Response;Day of week;Month of year Level of Generative/Spontaneous Verbalization: Conversation Naming: No impairment Pragmatics: No impairment Written Expression Written Expression: Not tested   Oral / Motor  Oral Motor/Sensory Function Overall Oral Motor/Sensory Function: Within functional limits Motor Speech Overall Motor Speech: Appears within functional limits for tasks assessed              [place] Dense, [place], [place] Office Number: [phone]  Acie Fredrickson 01/14/2021, 10:48 AM

## 2021-01-14 NOTE — Discharge Summary (Signed)
Physician Discharge Summary  Shelley Douglas UQJ:[id number] DOB: 1950/10/25 DOA: 01/12/2021  PCP: Elwyn Reach, MD  Admit date: 01/12/2021 Discharge date: 01/14/2021  Admitted From: Home Disposition:  Home  Discharge Condition:Stable CODE STATUS:FULL Diet recommendation: Heart Healthy  Brief/Interim Summary:   Shelley Douglas is a 71 y.o. female with medical history significant of systolic CHF and ischemic cardiomyopathy with BiV-ICD, afib on eliquis, hx of PE, HLD, HTN, hx of CVA and t2DM who presented to ED with complaints of dizziness, visual changes and speaking deficits.  On presentation she was hemodynamically stable.  Stroke was suspected.  CT head did not show any acute intracranial findings.  She could not  go for MRI because of pacemaker.  Follow-up CT head did not show any acute intracranial findings either.  Neurology was following.  Her symptoms have completely resolved today.  Aspirin has been added on her home medication list and Lipitor has been increased to 80 mg daily.  Neurology cleared for discharge today.  She will follow-up with neurology in 4 weeks.  PT/OT recommend outpatient follow-up.  Following problems were addressed during her hospitalization:  Stroke-like symptoms with dizziness, visual deficits and difficulty speaking - (present on admission) -symptoms have resolved -can not have MRI due to Biv-ICD. CTA head/neck did not show any acute findings -echo done, not resulted yet -a1c: 7.2 and LDL of 195  -Increase paroxetine to 40 mg -Continue daily aspirin 81 mg and eliquis  -Neurology cleared for discharge -PT/ OT, recommend outpatient follow-up     Type 2 diabetes mellitus without complication (HCC) L8L of 7.2  Continue home medication   Atrial fibrillation ([place])- (present on admission) V paced rhythm cha2ds2-vasc of 8 Continue eliquis/amiodarone/digoxin   Essential hypertension- (present on admission) Decently well controlled Continue home  medication: cozaar 25mg , coreg 25mg  bid, norvasc 5mg  and lasix 40mg  prn   Chronic systolic congestive heart failure with BiV-ICD- (present on admission) Appears euvolemic on exam Continue digoxin, cozaar, coreg. On lasix prn  -Biv-ICD interrogated on 01/12/21 at EP appointment. -Last echocardiogram done on 05/2017 showed EF of 25 to 30%  CAD (coronary artery disease)/HLD- (present on admission) Stable, continue medical management with ASA, coreg and lipitor  LDL of 195, increase the dose of Lipitor   Hypercalcemia- (present on admission) Resolved        Discharge Diagnoses:  Principal Problem:   Stroke-like symptoms with dizziness, visual deficits and difficulty speaking  Active Problems:   Atrial fibrillation (HCC)   Chronic systolic congestive heart failure with BiV-ICD   Type 2 diabetes mellitus without complication (HCC)   Essential hypertension   CAD (coronary artery disease)/HLD   Hypercalcemia   Cerebral thrombosis with cerebral infarction    Discharge Instructions  Discharge Instructions     Ambulatory referral to Neurology   Complete by: As directed    Follow up with stroke clinic NP (Jessica Vanschaick or Cecille Rubin, if both not available, consider Zachery Dauer, or Ahern) at [facility] in about 4 weeks. Thanks.   Diet - low sodium heart healthy   Complete by: As directed    Discharge instructions   Complete by: As directed    1)Please take prescribed medications as instructed 2)Follow up with your PCP in a week 3)Follow up with neurology in 4 weeks.  Name and number of the provider group has been attached   Increase activity slowly   Complete by: As directed       Allergies as of 01/14/2021  Reactions   Plavix [clopidogrel Bisulfate] Rash        Medication List     TAKE these medications    acetaminophen 650 MG CR tablet Commonly known as: TYLENOL Take 650 mg by mouth every 8 (eight) hours as needed for pain.   amiodarone 200 MG  tablet Commonly known as: PACERONE Take 1 tablet (200 mg total) by mouth daily.   amLODipine 5 MG tablet Commonly known as: NORVASC Take 5 mg by mouth daily.   apixaban 5 MG Tabs tablet Commonly known as: ELIQUIS Take 1 tablet (5 mg total) by mouth 2 (two) times daily.   aspirin EC 81 MG tablet Take 1 tablet (81 mg total) by mouth daily. Swallow whole.   atorvastatin 80 MG tablet Commonly known as: LIPITOR Take 1 tablet (80 mg total) by mouth daily. Start taking on: January 15, 2021 What changed:  medication strength how much to take   carvedilol 25 MG tablet Commonly known as: COREG Take 25 mg by mouth 2 (two) times daily.   digoxin 0.125 MG tablet Commonly known as: LANOXIN Take 0.0625 mg by mouth daily.   folic acid 1 MG tablet Commonly known as: FOLVITE Take 1 mg by mouth daily.   furosemide 40 MG tablet Commonly known as: LASIX Take 1 tablet (40 mg total) by mouth every Monday, Wednesday, and Friday. What changed:  when to take this reasons to take this   ibuprofen 200 MG tablet Commonly known as: ADVIL Take 200 mg by mouth every 6 (six) hours as needed for headache or moderate pain.   losartan 25 MG tablet Commonly known as: COZAAR Take 25 mg by mouth daily.   meclizine 12.5 MG tablet Commonly known as: ANTIVERT Take 25 mg by mouth daily.   metFORMIN 500 MG tablet Commonly known as: GLUCOPHAGE Take 500 mg by mouth 2 (two) times daily with a meal.   pantoprazole 40 MG tablet Commonly known as: PROTONIX Take 40 mg by mouth daily as needed (heartburn).   VITAMIN D3 PO Take 1,000 Units by mouth daily.        Follow-up Information     [facility]. Schedule an appointment as soon as possible for a visit in 1 month(s).   Specialty: Neurology Why: stroke clinic Contact information: [address] [place] [phone]        Elwyn Reach, MD. Schedule an appointment as soon as  possible for a visit in 1 week(s).   Specialty: Internal Medicine Contact information: [address] [place] [address] [phone]                Allergies  Allergen Reactions   Plavix [Clopidogrel Bisulfate] Rash    Consultations: Neurology   Procedures/Studies: CT ANGIO HEAD NECK W WO CM  Result Date: 01/14/2021 CLINICAL DATA:  Initial evaluation for acute TIA. EXAM: CT ANGIOGRAPHY HEAD AND NECK TECHNIQUE: Multidetector CT imaging of the head and neck was performed using the standard protocol during bolus administration of intravenous contrast. Multiplanar CT image reconstructions and MIPs were obtained to evaluate the vascular anatomy. Carotid stenosis measurements (when applicable) are obtained utilizing NASCET criteria, using the distal internal carotid diameter as the denominator. RADIATION DOSE REDUCTION: This exam was performed according to the departmental dose-optimization program which includes automated exposure control, adjustment of the mA and/or kV according to patient size and/or use of iterative reconstruction technique. CONTRAST:  39mL OMNIPAQUE IOHEXOL 350 MG/ML SOLN COMPARISON:  Prior  CT from 01/09/2021. FINDINGS: CT HEAD FINDINGS Brain: Generalized age-related cerebral atrophy with chronic small vessel ischemic disease. Multifocal chronic right MCA distribution infarcts involving the right frontal and parietal lobes. Small remote left parietal infarct noted. Remote lacunar infarct at the ventral right thalamus. No acute intracranial hemorrhage. No acute large vessel territory infarct. No mass lesion, mass effect or midline shift. No hydrocephalus or extra-axial fluid collection. Vascular: No hyperdense vessel. Skull: Scalp soft tissues and calvarium within normal limits. Sinuses: Right maxillary sinus retention cyst. Paranasal sinuses are otherwise clear. No mastoid effusion. Orbits: Globes and orbital soft tissues demonstrate no acute finding. Review of  the MIP images confirms the above findings CTA NECK FINDINGS Aortic arch: Visualized aortic arch normal caliber with normal branch pattern. No stenosis about the origin of the great vessels. Right carotid system: Right common and internal carotid arteries widely patent without stenosis, dissection or occlusion. Right carotid artery system partially medialized into the retropharyngeal space. Left carotid system: Left common and internal carotid arteries widely patent without stenosis, dissection or occlusion. Left carotid artery system partially medialized into the retropharyngeal space. Vertebral arteries: Both vertebral arteries arise from the subclavian arteries. No proximal subclavian artery stenosis. Both vertebral arteries widely patent without stenosis, dissection or occlusion. Skeleton: No discrete or worrisome osseous lesions. Moderate spondylosis at C5-6 and C6-7. Median sternotomy wires noted. Other neck: No other acute soft tissue abnormality within the neck. 4 mm right thyroid nodule noted, of doubtful significance given size and patient age, no follow-up imaging recommended (ref: J Am Coll Radiol. 2015 Feb;12(2): 143-50). Upper chest: Visualized upper chest demonstrates no acute finding. Probable changes of prior CABG. Left-sided pacemaker/AICD in place. Review of the MIP images confirms the above findings CTA HEAD FINDINGS Anterior circulation: Both internal carotid arteries widely patent to the termini without stenosis. A1 segments widely patent. Normal anterior communicating artery complex. Both anterior cerebral arteries widely patent to their distal aspects without stenosis. No M1 stenosis or occlusion. Normal MCA bifurcations. Distal MCA branches well perfused and symmetric. Posterior circulation: Both V4 segments patent to the vertebrobasilar junction without stenosis. Left PICA patent. Right PICA not well seen. Basilar diminutive but patent to its distal aspect without stenosis. Superior  cerebellar arteries patent bilaterally. Fetal type origin of the PCAs bilaterally. Both PCAs widely patent to their distal aspects. Venous sinuses: Grossly patent allowing for timing the contrast bolus. Anatomic variants: Fetal type origin of the PCAs with overall diminutive vertebrobasilar system. No aneurysm. Review of the MIP images confirms the above findings IMPRESSION: CT HEAD IMPRESSION: 1. No acute intracranial abnormality. 2. Chronic ischemic infarcts involving the right frontoparietal region, left parietal lobe, and right thalamus. 3. Underlying atrophy with chronic microvascular ischemic disease. CTA HEAD AND NECK IMPRESSION: 1. Negative CTA of the head and neck. No large vessel occlusion, hemodynamically significant stenosis, or other acute vascular abnormality. 2. Fetal type origin of the PCAs with overall diminutive vertebrobasilar system. Electronically Signed   By: Jeannine Boga M.D.   On: 01/14/2021 05:09   CT HEAD WO CONTRAST (5MM)  Result Date: 01/09/2021 CLINICAL DATA:  Dizziness, persistent/recurrent, cardiac or vascular cause suspected EXAM: CT HEAD WITHOUT CONTRAST TECHNIQUE: Contiguous axial images were obtained from the base of the skull through the vertex without intravenous contrast. COMPARISON:  May 2018 FINDINGS: Brain: There is no acute intracranial hemorrhage, mass effect, or edema. No acute appearing loss of gray-white differentiation. There are chronic appearing infarcts of the right frontal lobe, left parietal lobe, and right thalamus.  Minimal additional patchy low-density in the supratentorial white matter is nonspecific but may reflect chronic microvascular ischemic changes. There is no extra-axial fluid collection. Ventricles and sulci are slightly more prominent reflecting minor volume loss. Vascular: There is atherosclerotic calcification at the skull base. Skull: Calvarium is unremarkable. Sinuses/Orbits: No acute finding. Other: Incidental note is made of a  partially empty sella. IMPRESSION: No acute intracranial hemorrhage or evidence of acute infarction. Few chronic infarcts. Electronically Signed   By: Macy Mis M.D.   On: 01/09/2021 11:56      Subjective: Patient seen and examined the bedside this afternoon.  Hemodynamically stable.  Sitting in the chair.  She denies any dizziness or change in the vision.  Very excited to go home.  Discharge Exam: Vitals:   01/14/21 0014 01/14/21 0358  BP: (!) 131/57 (!) 121/59  Pulse: 66 66  Resp: 17 16  Temp: 98.1 F (36.7 C) 97.7 F (36.5 C)  SpO2: 95% 95%   Vitals:   01/13/21 1902 01/13/21 2104 01/14/21 0014 01/14/21 0358  BP:  130/65 (!) 131/57 (!) 121/59  Pulse: 95 78 66 66  Resp:  (!) 21 17 16   Temp:   98.1 F (36.7 C) 97.7 F (36.5 C)  TempSrc:   Oral Oral  SpO2:  97% 95% 95%  Weight:   97.3 kg   Height:   5\' 7"  (1.702 m)     General: Pt is alert, awake, not in acute distress, obese, chronically ill looking Cardiovascular: RRR, S1/S2 +, no rubs, no gallops Respiratory: CTA bilaterally, no wheezing, no rhonchi Abdominal: Soft, NT, ND, bowel sounds + Extremities: no edema, no cyanosis    The results of significant diagnostics from this hospitalization (including imaging, microbiology, ancillary and laboratory) are listed below for reference.     Microbiology: Recent Results (from the past 240 hour(s))  Urine Culture     Status: Abnormal   Collection Time: 01/09/21  6:26 PM   Specimen: Urine, Clean Catch  Result Value Ref Range Status   Specimen Description URINE, CLEAN CATCH  Final   Special Requests   Final    NONE Performed at [facility], [place] [address] [place], [place] [id number]    Culture MULTIPLE SPECIES PRESENT, SUGGEST RECOLLECTION (A)  Final   Report Status 01/11/2021 FINAL  Final  Resp Panel by RT-PCR (Flu A&B, Covid) Nasopharyngeal Swab     Status: None   Collection Time: 01/13/21 11:56 AM   Specimen: Nasopharyngeal Swab; Nasopharyngeal(NP)  swabs in vial transport medium  Result Value Ref Range Status   SARS Coronavirus 2 by RT PCR NEGATIVE NEGATIVE Final    Comment: (NOTE) SARS-CoV-2 target nucleic acids are NOT DETECTED.  The SARS-CoV-2 RNA is generally detectable in upper respiratory specimens during the acute phase of infection. The lowest concentration of SARS-CoV-2 viral copies this assay can detect is 138 copies/mL. A negative result does not preclude SARS-Cov-2 infection and should not be used as the sole basis for treatment or other patient management decisions. A negative result may occur with  improper specimen collection/handling, submission of specimen other than nasopharyngeal swab, presence of viral mutation(s) within the areas targeted by this assay, and inadequate number of viral copies(<138 copies/mL). A negative result must be combined with clinical observations, patient history, and epidemiological information. The expected result is Negative.  Fact Sheet for Patients:  EntrepreneurPulse.com.au  Fact Sheet for Healthcare Providers:  IncredibleEmployment.be  This test is no t yet approved or cleared by the [place]  States FDA and  has been authorized for detection and/or diagnosis of SARS-CoV-2 by FDA under an Emergency Use Authorization (EUA). This EUA will remain  in effect (meaning this test can be used) for the duration of the COVID-19 declaration under Section 564(b)(1) of the Act, 21 U.S.C.section 360bbb-3(b)(1), unless the authorization is terminated  or revoked sooner.       Influenza A by PCR NEGATIVE NEGATIVE Final   Influenza B by PCR NEGATIVE NEGATIVE Final    Comment: (NOTE) The Xpert Xpress SARS-CoV-2/FLU/RSV plus assay is intended as an aid in the diagnosis of influenza from Nasopharyngeal swab specimens and should not be used as a sole basis for treatment. Nasal washings and aspirates are unacceptable for Xpert Xpress  SARS-CoV-2/FLU/RSV testing.  Fact Sheet for Patients: EntrepreneurPulse.com.au  Fact Sheet for Healthcare Providers: IncredibleEmployment.be  This test is not yet approved or cleared by the [place] FDA and has been authorized for detection and/or diagnosis of SARS-CoV-2 by FDA under an Emergency Use Authorization (EUA). This EUA will remain in effect (meaning this test can be used) for the duration of the COVID-19 declaration under Section 564(b)(1) of the Act, 21 U.S.C. section 360bbb-3(b)(1), unless the authorization is terminated or revoked.  Performed at [facility], [place] [address] [place], [place] [address]      Labs: BNP (last 3 results) Recent Labs    10/11/20 0906  BNP 834.1*   Basic Metabolic Panel: Recent Labs  Lab 01/09/21 1050 01/12/21 1534 01/14/21 0817  NA 137 141 132*  K 3.6 3.5 3.9  CL 105 103 99  CO2 27 31 24   GLUCOSE 145* 112* 118*  BUN <5* 19 12  CREATININE 0.96 1.15* 0.86  CALCIUM 10.1 11.2* 10.1   Liver Function Tests: Recent Labs  Lab 01/09/21 1050 01/12/21 1534  AST 16 16  ALT 10 11  ALKPHOS 78 80  BILITOT 0.5 0.6  PROT 7.5 7.9  ALBUMIN 3.4* 3.6   No results for input(s): LIPASE, AMYLASE in the last 168 hours. No results for input(s): AMMONIA in the last 168 hours. CBC: Recent Labs  Lab 01/09/21 1050 01/12/21 1534  WBC 3.8* 4.4  NEUTROABS  --  2.3  HGB 12.6 12.7  HCT 39.5 40.2  MCV 89.8 88.9  PLT 194 205   Cardiac Enzymes: No results for input(s): CKTOTAL, CKMB, CKMBINDEX, TROPONINI in the last 168 hours. BNP: Invalid input(s): POCBNP CBG: Recent Labs  Lab 01/13/21 1822 01/14/21 0032 01/14/21 0612 01/14/21 1136  GLUCAP 111* 136* 154* 191*   D-Dimer No results for input(s): DDIMER in the last 72 hours. Hgb A1c Recent Labs    01/13/21 1059  HGBA1C 7.2*   Lipid Profile Recent Labs    01/13/21 1059  CHOL 280*  HDL 63  LDLCALC 195*  TRIG 108  CHOLHDL  4.4   Thyroid function studies No results for input(s): TSH, T4TOTAL, T3FREE, THYROIDAB in the last 72 hours.  Invalid input(s): FREET3 Anemia work up No results for input(s): VITAMINB12, FOLATE, FERRITIN, TIBC, IRON, RETICCTPCT in the last 72 hours. Urinalysis    Component Value Date/Time   COLORURINE YELLOW 01/09/2021 1226   APPEARANCEUR CLEAR 01/09/2021 1226   LABSPEC >1.030 (H) 01/09/2021 1226   PHURINE 6.0 01/09/2021 1226   GLUCOSEU NEGATIVE 01/09/2021 1226   HGBUR NEGATIVE 01/09/2021 1226   BILIRUBINUR NEGATIVE 01/09/2021 1226   KETONESUR NEGATIVE 01/09/2021 1226   PROTEINUR NEGATIVE 01/09/2021 1226   UROBILINOGEN 0.2 10/17/2009 2007   NITRITE NEGATIVE 01/09/2021 1226  LEUKOCYTESUR NEGATIVE 01/09/2021 1226   Sepsis Labs Invalid input(s): PROCALCITONIN,  WBC,  LACTICIDVEN Microbiology Recent Results (from the past 240 hour(s))  Urine Culture     Status: Abnormal   Collection Time: 01/09/21  6:26 PM   Specimen: Urine, Clean Catch  Result Value Ref Range Status   Specimen Description URINE, CLEAN CATCH  Final   Special Requests   Final    NONE Performed at [facility], [place] [address] [place], [place] [address]    Culture MULTIPLE SPECIES PRESENT, SUGGEST RECOLLECTION (A)  Final   Report Status 01/11/2021 FINAL  Final  Resp Panel by RT-PCR (Flu A&B, Covid) Nasopharyngeal Swab     Status: None   Collection Time: 01/13/21 11:56 AM   Specimen: Nasopharyngeal Swab; Nasopharyngeal(NP) swabs in vial transport medium  Result Value Ref Range Status   SARS Coronavirus 2 by RT PCR NEGATIVE NEGATIVE Final    Comment: (NOTE) SARS-CoV-2 target nucleic acids are NOT DETECTED.  The SARS-CoV-2 RNA is generally detectable in upper respiratory specimens during the acute phase of infection. The lowest concentration of SARS-CoV-2 viral copies this assay can detect is 138 copies/mL. A negative result does not preclude SARS-Cov-2 infection and should not be used as the sole  basis for treatment or other patient management decisions. A negative result may occur with  improper specimen collection/handling, submission of specimen other than nasopharyngeal swab, presence of viral mutation(s) within the areas targeted by this assay, and inadequate number of viral copies(<138 copies/mL). A negative result must be combined with clinical observations, patient history, and epidemiological information. The expected result is Negative.  Fact Sheet for Patients:  EntrepreneurPulse.com.au  Fact Sheet for Healthcare Providers:  IncredibleEmployment.be  This test is no t yet approved or cleared by the [place] FDA and  has been authorized for detection and/or diagnosis of SARS-CoV-2 by FDA under an Emergency Use Authorization (EUA). This EUA will remain  in effect (meaning this test can be used) for the duration of the COVID-19 declaration under Section 564(b)(1) of the Act, 21 U.S.C.section 360bbb-3(b)(1), unless the authorization is terminated  or revoked sooner.       Influenza A by PCR NEGATIVE NEGATIVE Final   Influenza B by PCR NEGATIVE NEGATIVE Final    Comment: (NOTE) The Xpert Xpress SARS-CoV-2/FLU/RSV plus assay is intended as an aid in the diagnosis of influenza from Nasopharyngeal swab specimens and should not be used as a sole basis for treatment. Nasal washings and aspirates are unacceptable for Xpert Xpress SARS-CoV-2/FLU/RSV testing.  Fact Sheet for Patients: EntrepreneurPulse.com.au  Fact Sheet for Healthcare Providers: IncredibleEmployment.be  This test is not yet approved or cleared by the [place] FDA and has been authorized for detection and/or diagnosis of SARS-CoV-2 by FDA under an Emergency Use Authorization (EUA). This EUA will remain in effect (meaning this test can be used) for the duration of the COVID-19 declaration under Section 564(b)(1) of the Act,  21 U.S.C. section 360bbb-3(b)(1), unless the authorization is terminated or revoked.  Performed at [facility], [place] [address], [place], [place] [address]     Please note: You were cared for by a hospitalist during your hospital stay. Once you are discharged, your primary care physician will handle any further medical issues. Please note that NO REFILLS for any discharge medications will be authorized once you are discharged, as it is imperative that you return to your primary care physician (or establish a relationship with a primary care physician if you  do not have one) for your post hospital discharge needs so that they can reassess your need for medications and monitor your lab values.    Time coordinating discharge: 40 minutes  SIGNED:   Shelly Coss, MD  [facility] 01/14/2021, 2:06 PM Pager [phone]  If 7PM-7AM, please contact night-coverage [email or web address] Password [email or web address]

## 2021-01-14 NOTE — Progress Notes (Addendum)
STROKE TEAM PROGRESS NOTE   ATTENDING NOTE: I reviewed above note and agree with the assessment and plan. Pt was seen and examined.   71 year old female with history of CHF, cardiomyopathy with AICD, A. fib on Eliquis, PE, hyperlipidemia, hypertension, diabetes and previous stroke admitted for dizziness, lightheadedness, more so on bending forward, imbalance on walking, left eye blurriness but no vision loss.  Symptoms getting better gradually.  Not able to have MRI due to AICD not compatible with MRI.  CT and CT repeat after 24 hours showed no acute finding.  CTA head and neck unremarkable.  LDL 195, A1c 7.2.  EF pending.  Creatinine 1.15.  Patient stated she is compliant with Eliquis.  On exam, patient awake, alert, eyes open, orientated to age, place, time and people. No aphasia, fluent language, following all simple commands. Able to name and repeat and read. No gaze palsy, tracking bilaterally, visual field full, PERRL. No facial droop. Tongue midline. No nystagmus in all directions of eye movement. Bilateral UEs 5/5, no drift. Bilaterally LEs 4/5, no drift. Sensation symmetrical bilaterally, b/l FTN and HTS intact, gait not tested.   Etiology for patient symptoms not quite clear, generally speaking nonspecific, however cannot rule out stroke.  Patient compliant with Eliquis, will recommend to add aspirin 81 on top of Eliquis.  Increase Lipitor from 40-80.  Aggressive stroke risk factor modification including better control of diabetes and hyperlipidemia.  PT/OT recommend outpatient PT/OT.  For detailed assessment and plan, please refer to above as I have made changes wherever appropriate.   Neurology will sign off. Please call with questions. Pt will follow up with stroke clinic NP at [facility] in about 4 weeks. Thanks for the consult.   Shelley Hawking, Shelley Douglas Stroke Neurology 01/14/2021 10:54 PM    INTERVAL HISTORY Her husband is at the bedside.  Patient reports she had been having dizziness  especially when bending forward as well as some imbalance while walking. Reports these symptoms had started 1/6 for which she went to the ED but later left AMA. However, symptoms persisted until she visited the hospital on 1/10. Patient reports not recalling any events leading up to her sudden dizziness. Today, patient denies having any neurological symptoms and believes she is more at her baseline. Patient reports regular compliance with medications besides 1/6 when she reports she was "fasting" and did not take any of her medications. Patient also reports to not having taken Aspirin as she had been previously prescribed for over a month.   Discussed plan to continue Eliquis and resume baby Aspirin for stroke prevention. Patient agreeable to plan and all questions were addressed.   Vitals:   01/13/21 1902 01/13/21 2104 01/14/21 0014 01/14/21 0358  BP:  130/65 (!) 131/57 (!) 121/59  Pulse: 95 78 66 66  Resp:  (!) 21 17 16   Temp:   98.1 F (36.7 C) 97.7 F (36.5 C)  TempSrc:   Oral Oral  SpO2:  97% 95% 95%  Weight:   97.3 kg   Height:   5\' 7"  (1.702 m)    CBC:  Recent Labs  Lab 01/09/21 1050 01/12/21 1534  WBC 3.8* 4.4  NEUTROABS  --  2.3  HGB 12.6 12.7  HCT 39.5 40.2  MCV 89.8 88.9  PLT 194 765   Basic Metabolic Panel:  Recent Labs  Lab 01/12/21 1534 01/14/21 0817  NA 141 132*  K 3.5 3.9  CL 103 99  CO2 31 24  GLUCOSE 112* 118*  BUN  19 12  CREATININE 1.15* 0.86  CALCIUM 11.2* 10.1   Lipid Panel:  Recent Labs  Lab 01/13/21 1059  CHOL 280*  TRIG 108  HDL 63  CHOLHDL 4.4  VLDL 22  LDLCALC 195*   HgbA1c:  Recent Labs  Lab 01/13/21 1059  HGBA1C 7.2*   Urine Drug Screen: No results for input(s): LABOPIA, COCAINSCRNUR, LABBENZ, AMPHETMU, THCU, LABBARB in the last 168 hours.  Alcohol Level No results for input(s): ETH in the last 168 hours.  IMAGING past 24 hours CT ANGIO HEAD NECK W WO CM  Result Date: 01/14/2021 CLINICAL DATA:  Initial evaluation for acute  TIA. EXAM: CT ANGIOGRAPHY HEAD AND NECK TECHNIQUE: Multidetector CT imaging of the head and neck was performed using the standard protocol during bolus administration of intravenous contrast. Multiplanar CT image reconstructions and MIPs were obtained to evaluate the vascular anatomy. Carotid stenosis measurements (when applicable) are obtained utilizing NASCET criteria, using the distal internal carotid diameter as the denominator. RADIATION DOSE REDUCTION: This exam was performed according to the departmental dose-optimization program which includes automated exposure control, adjustment of the mA and/or kV according to patient size and/or use of iterative reconstruction technique. CONTRAST:  55mL OMNIPAQUE IOHEXOL 350 MG/ML SOLN COMPARISON:  Prior CT from 01/09/2021. FINDINGS: CT HEAD FINDINGS Brain: Generalized age-related cerebral atrophy with chronic small vessel ischemic disease. Multifocal chronic right MCA distribution infarcts involving the right frontal and parietal lobes. Small remote left parietal infarct noted. Remote lacunar infarct at the ventral right thalamus. No acute intracranial hemorrhage. No acute large vessel territory infarct. No mass lesion, mass effect or midline shift. No hydrocephalus or extra-axial fluid collection. Vascular: No hyperdense vessel. Skull: Scalp soft tissues and calvarium within normal limits. Sinuses: Right maxillary sinus retention cyst. Paranasal sinuses are otherwise clear. No mastoid effusion. Orbits: Globes and orbital soft tissues demonstrate no acute finding. Review of the MIP images confirms the above findings CTA NECK FINDINGS Aortic arch: Visualized aortic arch normal caliber with normal branch pattern. No stenosis about the origin of the great vessels. Right carotid system: Right common and internal carotid arteries widely patent without stenosis, dissection or occlusion. Right carotid artery system partially medialized into the retropharyngeal space. Left  carotid system: Left common and internal carotid arteries widely patent without stenosis, dissection or occlusion. Left carotid artery system partially medialized into the retropharyngeal space. Vertebral arteries: Both vertebral arteries arise from the subclavian arteries. No proximal subclavian artery stenosis. Both vertebral arteries widely patent without stenosis, dissection or occlusion. Skeleton: No discrete or worrisome osseous lesions. Moderate spondylosis at C5-6 and C6-7. Median sternotomy wires noted. Other neck: No other acute soft tissue abnormality within the neck. 4 mm right thyroid nodule noted, of doubtful significance given size and patient age, no follow-up imaging recommended (ref: J Am Coll Radiol. 2015 Feb;12(2): 143-50). Upper chest: Visualized upper chest demonstrates no acute finding. Probable changes of prior CABG. Left-sided pacemaker/AICD in place. Review of the MIP images confirms the above findings CTA HEAD FINDINGS Anterior circulation: Both internal carotid arteries widely patent to the termini without stenosis. A1 segments widely patent. Normal anterior communicating artery complex. Both anterior cerebral arteries widely patent to their distal aspects without stenosis. No M1 stenosis or occlusion. Normal MCA bifurcations. Distal MCA branches well perfused and symmetric. Posterior circulation: Both V4 segments patent to the vertebrobasilar junction without stenosis. Left PICA patent. Right PICA not well seen. Basilar diminutive but patent to its distal aspect without stenosis. Superior cerebellar arteries patent bilaterally. Fetal type  origin of the PCAs bilaterally. Both PCAs widely patent to their distal aspects. Venous sinuses: Grossly patent allowing for timing the contrast bolus. Anatomic variants: Fetal type origin of the PCAs with overall diminutive vertebrobasilar system. No aneurysm. Review of the MIP images confirms the above findings IMPRESSION: CT HEAD IMPRESSION: 1. No  acute intracranial abnormality. 2. Chronic ischemic infarcts involving the right frontoparietal region, left parietal lobe, and right thalamus. 3. Underlying atrophy with chronic microvascular ischemic disease. CTA HEAD AND NECK IMPRESSION: 1. Negative CTA of the head and neck. No large vessel occlusion, hemodynamically significant stenosis, or other acute vascular abnormality. 2. Fetal type origin of the PCAs with overall diminutive vertebrobasilar system. Electronically Signed   By: Jeannine Boga M.D.   On: 01/14/2021 05:09    PHYSICAL EXAM  Temp:  [97.7 F (36.5 C)-98.1 F (36.7 C)] 97.7 F (36.5 C) (01/11 0358) Pulse Rate:  [66-78] 66 (01/11 0358) Resp:  [16-21] 16 (01/11 0358) BP: (121-131)/(57-65) 121/59 (01/11 0358) SpO2:  [95 %-97 %] 95 % (01/11 0358) Weight:  [97.3 kg] 97.3 kg (01/11 0014)  General - Well nourished, well developed, in no apparent distress.  Cardiovascular - Regular rhythm and rate.  Mental Status -  Level of arousal and orientation to time, place, and person were intact. Language including expression, naming, repetition, comprehension was assessed and found intact. Attention span and concentration were normal. Recent and remote memory were intact. Fund of Knowledge was assessed and was intact.  Cranial Nerves II - XII - II - Visual field intact OU. III, IV, VI - Extraocular movements intact. V - Facial sensation intact bilaterally. VII - Facial movement intact bilaterally. VIII - Hearing & vestibular intact bilaterally. X - Palate elevates symmetrically. XI - Chin turning & shoulder shrug intact bilaterally. XII - Tongue protrusion intact.  Motor Strength - The patients strength was normal in all extremities and pronator drift was absent.  Bulk was normal and fasciculations were absent.   Motor Tone - Muscle tone was assessed at the neck and appendages and was normal.  Reflexes - The patients reflexes were symmetrical in all extremities and she  had no pathological reflexes.  Sensory - Light touch, temperature/pinprick were assessed and were symmetrical.    Coordination - The patient had normal movements in the hands and feet with no ataxia or dysmetria.  Tremor was absent.  Gait and Station - deferred.  ASSESSMENT/PLAN Shelley Douglas is a 71 y.o. female with history of systolic CHF and ischemic cardiomyopathy with BiV-ICD, afib on eliquis, hx of PE, HLD, HTN, hx of CVA and t2DM presenting with dizziness, speaking deficits, and visual changes.   TIA vs Stroke Code Stroke CT head No acute abnormality, chronic ischemic infarcts in R frontoparietal region, L parietal lobe, and R thalamus CTA head & neck: negative  CT repeat no acute abnormalities MRI  unable to obtain due to incompatible AICD 2D Echo pending LDL 195 HgbA1c 7.2 VTE prophylaxis - Eliquis Eliquis (apixaban) daily prior to admission, now on aspirin 81 mg daily and Eliquis (apixaban) daily.  Therapy recommendations:  outpatient OT Disposition:  pending  Hypertension Home meds:  Norvasc, Coreg Stable Permissive hypertension (OK if < 220/120) but gradually normalize in 5-7 days Long-term BP goal normotensive  Hyperlipidemia Home meds:  Lipitor 40, resumed in hospital LDL 195, goal < 70 Increased to Lipitor 80 during this admission  Continue statin at lipitor 80 mg at discharge  Diabetes type II Controlled Home meds:  metformin HgbA1c  7.2, goal < 7.0 CBGs SSI  Other Stroke Risk Factors Advanced Age >/= 79  Former Cigarette smoker Obesity, Body mass index is 33.6 kg/m., BMI >/= 30 associated with increased stroke risk, recommend weight loss, diet and exercise as appropriate  Hx stroke/TIA Coronary artery disease Congestive heart failure   Hospital day # 0  Shelley Ravens, Shelley PGY1 Resident  To contact Stroke Continuity provider, please refer to [email or web address] After hours, contact General Neurology

## 2021-01-14 NOTE — Evaluation (Signed)
Physical Therapy Evaluation Patient Details Name: Shelley Douglas MRN: [id number] DOB: 01-Sep-1950 Today's Date: 01/14/2021  History of Present Illness  Shelley Douglas is a 71 y.o. female who presented to ED with complaints of dizziness, visual changes and speaking deficits. CT demonstrated chronic ischemic infarcts involving the right frontoparietal region, left parietal lobe, and right thalamus. Pt with medical history significant of systolic CHF and ischemic cardiomyopathy with BiV-ICD, afib on eliquis, hx of PE, HLD, HTN, hx of CVA and t2DM   Clinical Impression  Pt admitted with above. Pt with noted L visual field deficits causing L sided inattention. Pt amb without AD PTA however pt requires RW now for stability and improved gait pattern, pt agrees and desires a RW. Pt able to safely negotiate steps to enter home. Pt to benefit from outpt PT to address balance impairments and decrease falls risk. Acute PT to cont to follow.       Recommendations for follow up therapy are one component of a multi-disciplinary discharge planning process, led by the attending physician.  Recommendations may be updated based on patient status, additional functional criteria and insurance authorization.  Follow Up Recommendations Outpatient PT    Assistance Recommended at Discharge Frequent or constant Supervision/Assistance  Patient can return home with the following       Equipment Recommendations Rolling walker (2 wheels)  Recommendations for Other Services       Functional Status Assessment Patient has had a recent decline in their functional status and demonstrates the ability to make significant improvements in function in a reasonable and predictable amount of time.     Precautions / Restrictions Precautions Precautions: Fall Restrictions Weight Bearing Restrictions: No      Mobility  Bed Mobility               General bed mobility comments: pt up in recliner upon PT entry     Transfers Overall transfer level: Needs assistance Equipment used: Rolling walker (2 wheels) Transfers: Sit to/from Stand Sit to Stand: Min guard           General transfer comment: verbal cues for hand placement, push up from arm rests not pull up on walker, increased time    Ambulation/Gait Ambulation/Gait assistance: Min guard Gait Distance (Feet): 100 Feet Assistive device: Rolling walker (2 wheels) Gait Pattern/deviations: Step-through pattern;Decreased stride length;Trunk flexed Gait velocity: slow Gait velocity interpretation: 1.31 - 2.62 ft/sec, indicative of limited community ambulator   General Gait Details: pt began ambulation without AD as PTA, pt unsteady with short step height and length, more step to pattern and wide base of support, pt given walker, pt with improved fluidity, more steady, and increased step height and length  Stairs Stairs: Yes Stairs assistance: Min guard Stair Management: Two rails;Step to pattern;Forwards Number of Stairs: 2 General stair comments: increased time but steady  Wheelchair Mobility    Modified Rankin (Stroke Patients Only)       Balance Overall balance assessment: Needs assistance Sitting-balance support: Feet supported       Standing balance support: No upper extremity supported;During functional activity Standing balance-Leahy Scale: Fair                               Pertinent Vitals/Pain Pain Assessment: No/denies pain Faces Pain Scale: No hurt    Home Living Family/patient expects to be discharged to:: Private residence Living Arrangements: Spouse/significant other Available Help at Discharge: Family;Available 24 hours/day  Type of Home: House Home Access: Stairs to enter Entrance Stairs-Rails: Psychiatric nurse of Steps: 4-5   Home Layout: One level Home Equipment: Conservation officer, nature (2 wheels);Cane - single point;BSC/3in1;Shower seat Additional Comments: pt states the DME  is her husbands    Prior Function Prior Level of Function : Independent/Modified Independent             Mobility Comments: no AD ADLs Comments: indep     Hand Dominance   Dominant Hand: Right    Extremity/Trunk Assessment   Upper Extremity Assessment Upper Extremity Assessment: Defer to OT evaluation    Lower Extremity Assessment Lower Extremity Assessment: Generalized weakness    Cervical / Trunk Assessment Cervical / Trunk Assessment: Normal  Communication   Communication: Expressive difficulties (delayed response time, word finding difficulties)  Cognition Arousal/Alertness: Awake/alert Behavior During Therapy: WFL for tasks assessed/performed Overall Cognitive Status: Within Functional Limits for tasks assessed                                 General Comments: pt with mild L sided neglect however known visual field deficits, pt with understanding when PT educated her on always looking to the L        General Comments General comments (skin integrity, edema, etc.): VSS on RA, pt assisted to bathroom, pt supervision for transfer onto commode, use hand rail in bathroom, pt indep with pericare. Verbal cues required for hand washing due to impaired vision    Exercises     Assessment/Plan    PT Assessment Patient needs continued PT services  PT Problem List Decreased strength;Decreased activity tolerance;Decreased coordination;Decreased cognition;Decreased knowledge of use of DME;Decreased safety awareness;Decreased balance;Decreased mobility       PT Treatment Interventions DME instruction;Gait training;Stair training;Functional mobility training;Therapeutic activities;Therapeutic exercise;Balance training;Neuromuscular re-education    PT Goals (Current goals can be found in the Care Plan section)  Acute Rehab PT Goals Patient Stated Goal: home with spouse PT Goal Formulation: With patient Time For Goal Achievement: 01/28/21 Potential to  Achieve Goals: Good    Frequency Min 4X/week     Co-evaluation               AM-PAC PT "6 Clicks" Mobility  Outcome Measure Help needed turning from your back to your side while in a flat bed without using bedrails?: A Little Help needed moving from lying on your back to sitting on the side of a flat bed without using bedrails?: A Little Help needed moving to and from a bed to a chair (including a wheelchair)?: A Little Help needed standing up from a chair using your arms (e.g., wheelchair or bedside chair)?: A Little Help needed to walk in hospital room?: A Little Help needed climbing 3-5 steps with a railing? : A Little 6 Click Score: 18    End of Session     Patient left: in chair;with call bell/phone within reach;with chair alarm set;with nursing/sitter in room Nurse Communication: Mobility status PT Visit Diagnosis: Unsteadiness on feet (R26.81);Difficulty in walking, not elsewhere classified (R26.2)    Time: 3614-4315 PT Time Calculation (min) (ACUTE ONLY): 18 min   Charges:   PT Evaluation $PT Eval Moderate Complexity: 1 Mod          Kittie Plater, PT, DPT Acute Rehabilitation Services Pager #: [phone] Office #: [phone]   Berline Lopes 01/14/2021, 3:06 PM

## 2021-01-14 NOTE — Plan of Care (Signed)
°  Problem: Education: Goal: Knowledge of General Education information will improve Description: Including pain rating scale, medication(s)/side effects and non-pharmacologic comfort measures Outcome: Progressing   Problem: Health Behavior/Discharge Planning: Goal: Ability to manage health-related needs will improve Outcome: Progressing   Problem: Clinical Measurements: Goal: Ability to maintain clinical measurements within normal limits will improve Outcome: Progressing Goal: Will remain free from infection Outcome: Progressing Goal: Diagnostic test results will improve Outcome: Progressing Goal: Respiratory complications will improve Outcome: Progressing Goal: Cardiovascular complication will be avoided Outcome: Progressing   Problem: Activity: Goal: Risk for activity intolerance will decrease Outcome: Progressing   Problem: Nutrition: Goal: Adequate nutrition will be maintained Outcome: Progressing   Problem: Coping: Goal: Level of anxiety will decrease Outcome: Progressing   Problem: Elimination: Goal: Will not experience complications related to bowel motility Outcome: Progressing Goal: Will not experience complications related to urinary retention Outcome: Progressing   Problem: Pain Managment: Goal: General experience of comfort will improve Outcome: Progressing   Problem: Safety: Goal: Ability to remain free from injury will improve Outcome: Progressing   Problem: Skin Integrity: Goal: Risk for impaired skin integrity will decrease Outcome: Progressing   Problem: Education: Goal: Knowledge of disease or condition will improve Outcome: Progressing Goal: Knowledge of secondary prevention will improve (SELECT ALL) Outcome: Progressing Goal: Knowledge of patient specific risk factors will improve (INDIVIDUALIZE FOR PATIENT) Outcome: Progressing Goal: Individualized Educational Video(s) Outcome: Progressing   Problem: Coping: Goal: Will verbalize  positive feelings about self Outcome: Progressing   Problem: Health Behavior/Discharge Planning: Goal: Ability to manage health-related needs will improve Outcome: Progressing   Problem: Self-Care: Goal: Ability to participate in self-care as condition permits will improve Outcome: Progressing Goal: Verbalization of feelings and concerns over difficulty with self-care will improve Outcome: Progressing   Problem: Ischemic Stroke/TIA Tissue Perfusion: Goal: Complications of ischemic stroke/TIA will be minimized Outcome: Progressing

## 2021-01-14 NOTE — Progress Notes (Signed)
Received from ED on stretcher accompanied by nurse.  Oriented to room.  Info on rights and expectations provided to pt.  Stroke education started.

## 2021-01-14 NOTE — TOC Transition Note (Signed)
Transition of Care [facility]) - CM/SW Discharge Note   Patient Details  Name: Shelley Douglas MRN: [id number] Date of Birth: [birth year]  [facility] CM/SW Contact:  Pollie Friar, RN Phone Number: 01/14/2021, 2:30 PM   Clinical Narrative:    Patient is discharging home with outpatient therapy through [facility]. Orders in [place] and information on the AVS. Walker for home ordered through [place] and will be delivered to the room. Pt denies any issues with home medications.  Pts spouse provides needed transportation. She has transport home today.    Final next level of care: OP Rehab Barriers to Discharge: No Barriers Identified   Patient Goals and CMS Choice     Choice offered to / list presented to : Patient  Discharge Placement                       Discharge Plan and Services                DME Arranged: Walker rolling DME Agency: [employer] Date DME Agency Contacted: 01/14/21   Representative spoke with at DME Agency: [place] Interventions     Readmission Risk Interventions No flowsheet data found.

## 2021-01-14 NOTE — Care Management Obs Status (Signed)
[place] NOTIFICATION   Patient Details  Name: Shelley Douglas MRN: [id number] Date of Birth: 03-28-1950   Medicare Observation Status Notification Given:  Yes    Carles Collet, RN 01/14/2021, 1:39 PM

## 2021-01-19 LAB — ECHOCARDIOGRAM COMPLETE
AR max vel: 1.5 cm2
AV Area VTI: 1.58 cm2
AV Area mean vel: 1.4 cm2
AV Mean grad: 5 mmHg
AV Peak grad: 10.4 mmHg
Ao pk vel: 1.61 m/s
Area-P 1/2: 1.57 cm2
MV VTI: 0.57 cm2
S' Lateral: 4.15 cm

## 2021-01-22 ENCOUNTER — Ambulatory Visit: Payer: Medicare Other | Admitting: Rehabilitation

## 2021-01-22 ENCOUNTER — Ambulatory Visit: Payer: Medicare Other | Attending: Occupational Therapy | Admitting: Occupational Therapy

## 2021-02-11 ENCOUNTER — Other Ambulatory Visit: Payer: Self-pay | Admitting: Gastroenterology

## 2021-03-12 NOTE — Progress Notes (Signed)
[facility] [address] [place]. [place] [address] [phone]       HOSPITAL FOLLOW UP NOTE  Ms. Shelley Douglas Date of Birth:  08/13/1950 Medical Record Number:  [id number]   Reason for Referral:  hospital stroke follow up    SUBJECTIVE:   CHIEF COMPLAINT:  Chief Complaint  Patient presents with   Follow-up    Rm 1, alone. Here for hospital stroke f/u. Pt reports doing well today. Reports no dizzy spells.     HPI:   Shelley Douglas is a 71 y.o. who  has a past medical history of AICD (automatic cardioverter/defibrillator) present, Anxiety, CAD (coronary artery disease), Daily headache, DVT (deep venous thrombosis) ([place]) (1996), High cholesterol, History of blood transfusion, Hypertension, Ischemic cardiomyopathy, Left bundle branch block, Mitral valve regurgitation, Myocardial infarction ([place]) (~ 2009 X2), Obesity, Persistent atrial fibrillation ([place]), Pulmonary embolism ([place]) (1993), Seizures ([place]) (1970s), Stroke ([place]) (2001), and Type II diabetes mellitus ([place]).  Patient presented on 01/14/2021 with dizziness, lightheadedness, more so with bending forward, imbalance with walking, left eye blurriness but no vision loss. Personally reviewed hospitalization pertinent progress notes, lab work and imaging.  Evaluated by Dr Erlinda Hong.   Since being home, she reports doing fairly well. No new or worsening symptoms. She denies dizziness or lightheadedness. She reports waxing and waning weakness of right lower extremity (occurring prior to current TIA). Inpatient PT eval recommended outpatient therapy d/t concerns of left sided inattention. Orders were placed to Neuro rehab but patient has not scheduled. She and her husband do not feel it is needed. She denies continued concerns at home. Gait is stable without assistive device. No falls. She reports seeing PCP since hospitalization.  BP is usually around 140-145 sys. Unsure of diastolic. She reports not taking her BP  meds this morning as she was under the impression she was having a colonoscopy. Per Epic, colonoscopy scheduled for 04/17/2021.    PERTINENT IMAGING/LABS  Code Stroke CT head No acute abnormality, chronic ischemic infarcts in R frontoparietal region, L parietal lobe, and R thalamus CTA head & neck: negative  CT repeat no acute abnormalities MRI  unable to obtain due to incompatible AICD 2D Echo EF 30-35%   A1C Lab Results  Component Value Date   HGBA1C 7.2 (H) 01/13/2021    Lipid Panel     Component Value Date/Time   CHOL 280 (H) 01/13/2021 1059   TRIG 108 01/13/2021 1059   HDL 63 01/13/2021 1059   CHOLHDL 4.4 01/13/2021 1059   VLDL 22 01/13/2021 1059   LDLCALC 195 (H) 01/13/2021 1059      ROS:   14 system review of systems performed and negative with exception of those listed in HPI  PMH:  Past Medical History:  Diagnosis Date   AICD (automatic cardioverter/defibrillator) present    Anxiety    pt denies this hx on 05/12/2016   CAD (coronary artery disease)    last cath 2016   Daily headache    DVT (deep venous thrombosis) ([place]) 1996   High cholesterol    History of blood transfusion    "related to pregnancy" (05/12/2016)   Hypertension    "just started taking RX" (05/12/2016)   Ischemic cardiomyopathy    Left bundle branch block    Mitral valve regurgitation    Myocardial infarction ([place]) ~ 2009 X2   Obesity    Persistent atrial fibrillation ([place])    Pulmonary embolism ([place]) 1993   Seizures ([place]) 1970s   "I  had 1; don't know what it was from" (05/12/2016)   Stroke [facility]) 2001   denies residual on 05/12/2016   Type II diabetes mellitus ([place])     PSH:  Past Surgical History:  Procedure Laterality Date   BILATERAL CARPAL TUNNEL RELEASE Bilateral 1990s   CARDIAC CATHETERIZATION N/A 09/27/2014   Procedure: Left Heart Cath and Coronary Angiography;  Surgeon: Dixie Dials, MD;  Location: [place] [facility];  Service: Cardiovascular;  Laterality: N/A;   CARDIAC  DEFIBRILLATOR PLACEMENT  07/24/2007   Medtronic Secura DR implanted in [place] for primary prevention   CORONARY ANGIOPLASTY WITH STENT PLACEMENT  ~ 2009   CORONARY ARTERY BYPASS GRAFT  ~ 2009   DILATION AND CURETTAGE OF UTERUS     EP IMPLANTABLE DEVICE N/A 07/01/2015   BiV ICD upgrade to Medtronic [employer] XT CRT-D device for primary prevention   EXTERNAL EAR SURGERY Right 1990   "skin grafted from my right thigh and put on my right ear"   HERNIA REPAIR  1990's    "stomach"   [place]    Social History:  Social History   Socioeconomic History   Marital status: Married    Spouse name: Not on file   Number of children: Not on file   Years of education: Not on file   Highest education level: Not on file  Occupational History   Occupation: disabled  Tobacco Use   Smoking status: Former    Years: 4.00    Types: Cigarettes    Quit date: 01/05/1988    Years since quitting: 33.2   Smokeless tobacco: Never   Tobacco comments:    05/12/2016 "someday smoker when I did smoked; never smoked much"  Vaping Use   Vaping Use: Never used  Substance and Sexual Activity   Alcohol use: No   Drug use: No   Sexual activity: Not Currently  Other Topics Concern   Not on file  Social History Narrative   Pt lives in [place] with spouse.   Disabled   Social Determinants of Health   Financial Resource Strain: Not on file  Food Insecurity: Not on file  Transportation Needs: Not on file  Physical Activity: Not on file  Stress: Not on file  Social Connections: Not on file  Intimate Partner Violence: Not on file    Family History:  Family History  Problem Relation Age of Onset   Hypertension Unknown     Medications:   Current Outpatient Medications on File Prior to Visit  Medication Sig Dispense Refill   acetaminophen (TYLENOL) 650 MG CR tablet Take 650 mg by mouth every 8 (eight) hours as needed for pain.     amiodarone (PACERONE) 200  MG tablet Take 1 tablet (200 mg total) by mouth daily. 30 tablet 3   amLODipine (NORVASC) 5 MG tablet Take 5 mg by mouth daily.     apixaban (ELIQUIS) 5 MG TABS tablet Take 1 tablet (5 mg total) by mouth 2 (two) times daily. 60 tablet 3   aspirin EC 81 MG tablet Take 1 tablet (81 mg total) by mouth daily. Swallow whole. 30 tablet 1   atorvastatin (LIPITOR) 80 MG tablet Take 1 tablet (80 mg total) by mouth daily. 30 tablet 1   carvedilol (COREG) 25 MG tablet Take 25 mg by mouth 2 (two) times daily.     Cholecalciferol (VITAMIN D3 PO) Take 1,000 Units by mouth daily.  digoxin (LANOXIN) 0.125 MG tablet Take 0.0625 mg by mouth daily.     folic acid (FOLVITE) 1 MG tablet Take 1 mg by mouth daily.      furosemide (LASIX) 40 MG tablet Take 1 tablet (40 mg total) by mouth every Monday, Wednesday, and Friday. (Patient taking differently: Take 40 mg by mouth 2 (two) times daily as needed for edema or fluid.)     ibuprofen (ADVIL) 200 MG tablet Take 200 mg by mouth every 6 (six) hours as needed for headache or moderate pain.     losartan (COZAAR) 25 MG tablet Take 25 mg by mouth daily.     meclizine (ANTIVERT) 12.5 MG tablet Take 25 mg by mouth daily.     metFORMIN (GLUCOPHAGE) 500 MG tablet Take 500 mg by mouth 2 (two) times daily with a meal.     pantoprazole (PROTONIX) 40 MG tablet Take 40 mg by mouth daily as needed (heartburn).     No current facility-administered medications on file prior to visit.    Allergies:   Allergies  Allergen Reactions   Plavix [Clopidogrel Bisulfate] Rash      OBJECTIVE:  Physical Exam  Vitals:   03/16/21 0751  BP: (!) 174/100  Pulse: 95  Weight: 217 lb (98.4 kg)  Height: '5\' 7"'$  (1.702 m)   Body mass index is 33.99 kg/m. No results found.  No flowsheet data found.   General: well developed, well nourished, seated, in no evident distress Head: head normocephalic and atraumatic.   Neck: supple with no carotid or supraclavicular  bruits Cardiovascular: regular rate and rhythm, no murmurs Musculoskeletal: no deformity Skin:  no rash/petichiae Vascular:  Normal pulses all extremities   Neurologic Exam Mental Status: Awake and fully alert.  Fluent speech and language.  Oriented to place and time. She does mention that it is 2003 but then able to correct herself and states Biden as current president and spring as current season. Recent and remote memory intact. Attention span, concentration and fund of knowledge appropriate. Mood and affect appropriate.  Cranial Nerves: Fundoscopic exam reveals sharp disc margins. Pupils equal, briskly reactive to light. Extraocular movements full without nystagmus. Visual fields full to confrontation. Hearing intact. Facial sensation intact. Face, tongue, palate moves normally and symmetrically.  Motor: Normal bulk and tone. Normal strength in all tested extremity muscles Sensory.: intact to touch , pinprick , position and vibratory sensation.  Coordination: Rapid alternating movements normal in all extremities. Finger-to-nose and heel-to-shin performed accurately bilaterally. Gait and Station: Arises from chair without difficulty. Stance is normal. Gait demonstrates normal stride length and balance without assistive device.  Reflexes: 1+ and symmetric.    NIHSS  0 Modified Rankin  0    ASSESSMENT: Shelley Douglas is a 71 y.o. year old female with history of systolic CHF and ischemic cardiomyopathy with BiV-ICD, afib on eliquis, hx of PE, HLD, HTN, hx of CVA and T2DM who presented to the ER 01/14/2021 with dizziness, speaking deficits, and visual changes. Vascular risk factors include HLD, HLD, DMTII, advanced age, former smoker, obesity, previous hx CVA/TIA, CAD and CHF.      PLAN:  TIA versus CVA : Residual deficit: none. Continue aspirin 81 mg daily and Eliquis (apixaban) daily  and atorvastatin '40mg'$  daily  for secondary stroke prevention.  Discussed secondary stroke prevention  measures and importance of close PCP follow up for aggressive stroke risk factor management. I have gone over the pathophysiology of stroke, warning signs and symptoms, risk factors and their  management in some detail with instructions to go to the closest emergency room for symptoms of concern. HTN: BP goal <130/90. Elevated in the office, today. She has not taken meds due to anticipation of colonoscopy. Continue amlodipine and carvedilol per PCP.  Patient advised of BP goal and encouraged to call PCP to discuss need for adjustment in medications if BP elevated consistently at home. HLD: LDL goal <70. Recent LDL 195. Continue atorvastatin '40mg'$  daily per PCP. DMII: A1c goal<7.0. Recent A1c 7.2. Continue metformin as directed with PCP.  CAD/CHF: continue digoxin, amiodarone and furosemide for CHF. Continue close follow up with cardiology.  I have provided contact number for PT with neuro rehab as advised in hospital. She and her husband do not feel this is needed but she will continue to discuss with PCP and husband.    Follow up in 6 months or call earlier if needed   CC:  [facility] provider: Dr. Leonie Man PCP: Elwyn Reach, MD    I spent 45 minutes of face-to-face and non-face-to-face time with patient.  This included previsit chart review including review of recent hospitalization, lab review, study review, order entry, electronic health record documentation, patient education regarding recent stroke including etiology, secondary stroke prevention measures and importance of managing stroke risk factors, residual deficits and typical recovery time and answered all other questions to patient satisfaction   Debbora Presto, [facility]  [facility] [address] [place], [place] [address]  Phone [phone] Fax [phone] Note: This document was prepared with digital dictation and possible smart phrase technology. Any transcriptional errors that result from this process are  unintentional.

## 2021-03-12 NOTE — Patient Instructions (Signed)
Below is our plan: ? ?TIA versus CVA :Continue aspirin 81 mg daily and Eliquis (apixaban) daily and atorvastatin '40mg'$  daily  for secondary stroke prevention.  Discussed secondary stroke prevention measures and importance of close PCP follow up for aggressive stroke risk factor management. I have gone over the pathophysiology of stroke, warning signs and symptoms, risk factors and their management in some detail with instructions to go to the closest emergency room for symptoms of concern. ?HTN: BP goal <130/90.  It is elevated today, maybe due to colonoscopy prep and not taking your medicaitons. Continue  amlodipine and carvedilol per PCP ?HLD: LDL goal <70. Recent LDL 195. Continue atorvastatin '40mg'$  daily.  ?DMII: A1c goal<7.0. Recent A1c 7.2.  ?CAD/CHF: continue close follow up with cardiology.  ? ?Please call Dr Benson Norway to discuss colonoscopy dates:  [phone] ? ?Please contact Neuro PT at the following to schedule:  [phone] ? ?Please make sure you are staying well hydrated. I recommend 50-60 ounces daily. Well balanced diet and regular exercise encouraged. Consistent sleep schedule with 6-8 hours recommended.  ? ?Please continue follow up with care team as directed.  ? ?Follow up with me in 6 months  ? ?You may receive a survey regarding today's visit. I encourage you to leave honest feed back as I do use this information to improve patient care. Thank you for seeing me today!  ? ? ?

## 2021-03-16 ENCOUNTER — Encounter: Payer: Self-pay | Admitting: Family Medicine

## 2021-03-16 ENCOUNTER — Ambulatory Visit: Payer: Medicare Other | Admitting: Family Medicine

## 2021-03-16 VITALS — BP 174/100 | HR 95 | Ht 67.0 in | Wt 217.0 lb

## 2021-03-16 DIAGNOSIS — Z794 Long term (current) use of insulin: Secondary | ICD-10-CM

## 2021-03-16 DIAGNOSIS — E119 Type 2 diabetes mellitus without complications: Secondary | ICD-10-CM

## 2021-03-16 DIAGNOSIS — I10 Essential (primary) hypertension: Secondary | ICD-10-CM | POA: Diagnosis not present

## 2021-03-16 DIAGNOSIS — R2990 Unspecified symptoms and signs involving the nervous system: Secondary | ICD-10-CM

## 2021-03-16 DIAGNOSIS — G459 Transient cerebral ischemic attack, unspecified: Secondary | ICD-10-CM | POA: Diagnosis not present

## 2021-03-16 DIAGNOSIS — E785 Hyperlipidemia, unspecified: Secondary | ICD-10-CM

## 2021-03-23 ENCOUNTER — Ambulatory Visit: Payer: Medicare Other | Attending: Occupational Therapy

## 2021-03-23 ENCOUNTER — Other Ambulatory Visit: Payer: Self-pay

## 2021-03-23 ENCOUNTER — Ambulatory Visit: Payer: Medicare Other | Admitting: Occupational Therapy

## 2021-03-23 VITALS — BP 132/82 | HR 66

## 2021-03-23 DIAGNOSIS — R2689 Other abnormalities of gait and mobility: Secondary | ICD-10-CM

## 2021-03-23 DIAGNOSIS — R2990 Unspecified symptoms and signs involving the nervous system: Secondary | ICD-10-CM | POA: Insufficient documentation

## 2021-03-23 DIAGNOSIS — M6281 Muscle weakness (generalized): Secondary | ICD-10-CM

## 2021-03-23 DIAGNOSIS — R2681 Unsteadiness on feet: Secondary | ICD-10-CM

## 2021-03-23 DIAGNOSIS — R414 Neurologic neglect syndrome: Secondary | ICD-10-CM

## 2021-03-23 NOTE — Therapy (Signed)
?OUTPATIENT PHYSICAL THERAPY NEURO EVALUATION ? ? ?Patient Name: Shelley Douglas ?MRN: [id number] ?DOB:[birth year], 71 y.o., female ?Today's Date: 03/23/2021 ? ?PCP: Shelley Reach, MD ?REFERRING PROVIDER: Shelly Coss, MD (hospitalist) ? ? PT End of Session - 03/23/21 0854   ? ? Visit Number 1   ? Number of Visits 13   ? Date for PT Re-Evaluation 05/22/21   ? Authorization Type [employer] medicare so 10th visit progress note   ? PT Start Time [phone]   ? PT Stop Time 0931   ? PT Time Calculation (min) 39 min   ? Activity Tolerance Patient tolerated treatment well   ? Behavior During Therapy [facility] for tasks assessed/performed   ? ?  ?  ? ?  ? ? ?Past Medical History:  ?Diagnosis Date  ? AICD (automatic cardioverter/defibrillator) present   ? Anxiety   ? pt denies this hx on 05/12/2016  ? CAD (coronary artery disease)   ? last cath 2016  ? Daily headache   ? DVT (deep venous thrombosis) ([place]) 1996  ? High cholesterol   ? History of blood transfusion   ? "related to pregnancy" (05/12/2016)  ? Hypertension   ? "just started taking RX" (05/12/2016)  ? Ischemic cardiomyopathy   ? Left bundle branch block   ? Mitral valve regurgitation   ? Myocardial infarction [facility]) ~ 2009 X2  ? Obesity   ? Persistent atrial fibrillation ([place])   ? Pulmonary embolism ([place]) 1993  ? Seizures ([place]) 1970s  ? "I had 1; don't know what it was from" (05/12/2016)  ? Stroke [facility]) 2001  ? denies residual on 05/12/2016  ? Type II diabetes mellitus ([place])   ? ?Past Surgical History:  ?Procedure Laterality Date  ? BILATERAL CARPAL TUNNEL RELEASE Bilateral 1990s  ? CARDIAC CATHETERIZATION N/A 09/27/2014  ? Procedure: Left Heart Cath and Coronary Angiography;  Surgeon: Shelley Dials, MD;  Location: [facility];  Service: Cardiovascular;  Laterality: N/A;  ? CARDIAC DEFIBRILLATOR PLACEMENT  07/24/2007  ? Medtronic Secura DR implanted in [place] for primary prevention  ? CORONARY ANGIOPLASTY WITH STENT PLACEMENT  ~ 2009  ? CORONARY ARTERY BYPASS GRAFT  ~ 2009  ?  DILATION AND CURETTAGE OF UTERUS    ? EP IMPLANTABLE DEVICE N/A 07/01/2015  ? BiV ICD upgrade to [employer] XT CRT-D device for primary prevention  ? EXTERNAL EAR SURGERY Right 1990  ? "skin grafted from my right thigh and put on my right ear"  ? HERNIA REPAIR  1990's   ? "stomach"  ? TUBAL LIGATION    ? [place]  ? ?Patient Active Problem List  ? Diagnosis Date Noted  ? Cerebral thrombosis with cerebral infarction 01/14/2021  ? Stroke-like symptoms with dizziness, visual deficits and difficulty speaking  01/13/2021  ? Type 2 diabetes mellitus without complication ([place]) 01/06/7251  ? Essential hypertension 01/13/2021  ? CAD (coronary artery disease)/HLD 01/13/2021  ? Hypercalcemia 01/13/2021  ? Chronic systolic dysfunction of left ventricle 07/01/2015  ? Dizziness 09/24/2014  ? Ischemic cardiomyopathy 08/07/2010  ? 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 PULMONARY EMBOLISM 01/29/2010  ? VENTRICULAR TACHYCARDIA 01/29/2010  ? Atrial fibrillation ([place]) 01/29/2010  ? Chronic systolic congestive heart failure with BiV-ICD 01/29/2010  ? Cerebral artery occlusion with cerebral infarction ([place]) 01/29/2010  ? IMPLANTATION OF DEFIBRILLATOR, HX OF 01/29/2010  ? ? ?ONSET DATE: 01/12/21 ? ?REFERRING DIAG: R29.90 (ICD-10-CM) - Stroke-like symptoms  ? ?THERAPY  DIAG:  ?Other abnormalities of gait and mobility ? ?Muscle weakness (generalized) ? ?Unsteadiness on feet ? ?SUBJECTIVE:  ?                                                                                                                                                                                           ? ?SUBJECTIVE STATEMENT: ?Pt was recently hospitalized for TIA on 01/12/21. Pt reports that the chronic strokes were from the 63s. Pt reports that her balance was off and she had some slurred speech and memory deficits.  With the recent episode she reports that symptoms seems to come and go. She had some  unsteadiness and right sided weakness. Pt has a defibrillator and has gone off twice in the past. Pt reports that some days she doesn't really feel like getting out of bed. Energy level has not been as good since recent TIA. ?Pt accompanied by: self ? ?PERTINENT HISTORY:  Shelley Douglas is a 71 y.o. female with medical history significant of systolic CHF and ischemic cardiomyopathy with BiV-ICD, afib on eliquis, hx of PE, HLD, HTN, hx of CVA and t2DM who presented to ED with complaints of dizziness, visual changes and speaking deficits. ? ?PAIN:  ?Are you having pain? No ? ?PRECAUTIONS: Fall and ICD/Pacemaker ? ?WEIGHT BEARING RESTRICTIONS No ? ?FALLS: Has patient fallen in last 6 months? No, Number of falls: 0 ? ?LIVING ENVIRONMENT: ?Lives with: lives with their spouse ?Lives in: House/apartment ?Stairs: Yes; External: 4 steps; can Douglas both ?Has following equipment at home: [place] Rile - 4 wheeled and Grab bars ? ?PLOF: Independent, does not drive. Has baseline of memory impairments so can not remember how to get places.  ? ?PATIENT GOALS Pt would like to be able to clean up house better. ? ?OBJECTIVE:  ? ?DIAGNOSTIC FINDINGS: CT HEAD WITHOUT CONTRAST ?chronic appearing infarcts of the right frontal lobe, left ?parietal lobe, and right thalamus ? ?COGNITION: ?Overall cognitive status: Impaired: Memory: Deficits difficulty remembering things ?  ?SENSATION: ?WFL ?Light touch intact but pt does report some numbness in toes at times ?Vision: pt reports some double vision at times over the last 3 months or so. Appeared intact with screen. Peripheral vision and smooth pursuit intact. Pt wears glasses for distance and reading ? ?COORDINATION: ?Intact with RAMs ? ?EDEMA:  ?Mild pitting edema in legs ? ? ?POSTURE: rounded shoulders ? ? ? ?MMT:   ? ? UE WFL except for right shoulder pain with resisted flexion with old shoulder fracture  ?Tested sitting: ?MMT Right ?03/23/2021 Left ?03/23/2021  ?Hip flexion 4/5 4/5  ?Hip  extension    ?Hip abduction  4/5 4/5  ?Hip adduction 4/5 4/5  ?Hip internal rotation    ?Hip external rotation    ?Knee flexion 4/5 4/5  ?Knee extension 5/5 5/5  ?Ankle dorsiflexion 4/5 4/5  ?Ankle plantarflexion    ?Ankle inversion    ?Ankle eversion    ?(Blank rows = not tested) ? ? ? ?TRANSFERS: ? ?Sit to stand: SBA uses hands unless cued otherwise ?Stand to sit: SBA ? ? ? ? ?STAIRS: ? Level of Assistance: SBA ? Stair Negotiation Technique: Step to Pattern with Bilateral Rails ? Number of Stairs: 4  ? Height of Stairs: 6   ?Comments: up with right and down with right ? ?GAIT: ?Gait pattern: step through pattern and lateral hip instability ?Distance walked: 75  ?Assistive device utilized: None ?Level of assistance: SBA ?Comments: Pt reported feeling like heart racing afterwards feeling exhausted. HR=78. ? ?FUNCTIONAL TESTs:  ?5 times sit to stand: 19.05 sec from chair without hands ?Timed up and go (TUG): .18.10 sec ?10 meter walk test: 10.79 sec=0.75ms ? ? ? ?TODAY'S TREATMENT:  ? ? ? ?PATIENT EDUCATION: ?Education details: PT poc ?Person educated: Patient ?Education method: Explanation ?Education comprehension: verbalized understanding ? ? ?HOME EXERCISE PROGRAM: ? ? ? ? ?GOALS: ?Goals reviewed with patient? Yes ? ?SHORT TERM GOALS: Target date: 04/20/2021 ? ?Pt will be independent with initial HEP for strengthening and balance to continue gains on own. ?Baseline:  ?Goal status: INITIAL ? ?2.  6 min walk will be assessed and LTG updated. ?Baseline: TBD ?Goal status: INITIAL ? ?3.  Pt will decrease 5 x sit to stand from 19.05 sec to <16 sec for improved balance and functional strength. ?Baseline: 19.05 sec from chair without hands ?Goal status: INITIAL ? ?4.  Pt will ambulate >300' on varied level surfaces independently for improved household and short community distances. ?Baseline:  ?Goal status: INITIAL ? ?5.  FGA will be assessed and LTG updated. ?Baseline:  ?Goal status: INITIAL ? ? ? ?LONG TERM GOALS: Target  date:  05/22/21 ? ?Pt will be independent with progressive HEP for strengthening, balance and aerobic activity to continue gains on own. ?Baseline: no current HEP ?Goal status: INITIAL ? ?2.  Pt will report abilit

## 2021-03-23 NOTE — Therapy (Signed)
[facility] ?[address] [place] ?[place], [place], [address] ?Phone: [phone]   Fax:  [phone] ? ?Occupational Therapy Evaluation ? ?Patient Details  ?Name: Shelley Douglas ?MRN: [id number] ?Date of Birth: 09/15/1950 ?Referring Provider (OT): Tawanna Solo, Amrit ? ? ?Encounter Date: 03/23/2021 ? ? OT End of Session - 03/23/21 1026   ? ? Visit Number 1   ? Number of Visits 7   ? Date for OT Re-Evaluation 04/23/21   ? Authorization Type UHC MCR   ? Progress Note Due on Visit 10   ? OT Start Time 0930   ? OT Stop Time 1012   ? OT Time Calculation (min) 42 min   ? Activity Tolerance Patient tolerated treatment well   ? Behavior During Therapy [facility] for tasks assessed/performed   ? ?  ?  ? ?  ? ? ?Past Medical History:  ?Diagnosis Date  ? AICD (automatic cardioverter/defibrillator) present   ? Anxiety   ? pt denies this hx on 05/12/2016  ? CAD (coronary artery disease)   ? last cath 2016  ? Daily headache   ? DVT (deep venous thrombosis) ([place]) 1996  ? High cholesterol   ? History of blood transfusion   ? "related to pregnancy" (05/12/2016)  ? Hypertension   ? "just started taking RX" (05/12/2016)  ? Ischemic cardiomyopathy   ? Left bundle branch block   ? Mitral valve regurgitation   ? Myocardial infarction [facility]) ~ 2009 X2  ? Obesity   ? Persistent atrial fibrillation ([place])   ? Pulmonary embolism ([place]) 1993  ? Seizures ([place]) 1970s  ? "I had 1; don't know what it was from" (05/12/2016)  ? Stroke [facility]) 2001  ? denies residual on 05/12/2016  ? Type II diabetes mellitus ([place])   ? ? ?Past Surgical History:  ?Procedure Laterality Date  ? BILATERAL CARPAL TUNNEL RELEASE Bilateral 1990s  ? CARDIAC CATHETERIZATION N/A 09/27/2014  ? Procedure: Left Heart Cath and Coronary Angiography;  Surgeon: Dixie Dials, MD;  Location: [facility];  Service: Cardiovascular;  Laterality: N/A;  ? CARDIAC DEFIBRILLATOR PLACEMENT  07/24/2007  ? Medtronic Secura DR implanted in [place] for  primary prevention  ? CORONARY ANGIOPLASTY WITH STENT PLACEMENT  ~ 2009  ? CORONARY ARTERY BYPASS GRAFT  ~ 2009  ? DILATION AND CURETTAGE OF UTERUS    ? EP IMPLANTABLE DEVICE N/A 07/01/2015  ? BiV ICD upgrade to Medtronic [employer] XT CRT-D device for primary prevention  ? EXTERNAL EAR SURGERY Right 1990  ? "skin grafted from my right thigh and put on my right ear"  ? HERNIA REPAIR  1990's   ? "stomach"  ? TUBAL LIGATION    ? [place]  ? ? ?There were no vitals filed for this visit. ? ? Subjective Assessment - 03/23/21 0936   ? ? Pertinent History presented to ED 01/12/21 with complaints of dizziness, visual changes and speaking deficits. CT demonstrated chronic ischemic infarcts involving the right frontoparietal region, left parietal lobe, and right thalamus. Pt with medical history significant of systolic CHF and ischemic cardiomyopathy with BiV-ICD, afib on eliquis, hx of PE, HLD, HTN, hx of CVA and t2DM   ? Currently in Pain? No/denies   ? ?  ?  ? ?  ? ? ? ? [facility] OT Assessment - 03/23/21 0001   ? ?  ? Assessment  ? Medical Diagnosis Stroke-like symptoms/TIA   multiple chronic small infarcts  ?  Referring Provider (OT) Tawanna Solo, Amrit   ? Onset Date/Surgical Date 01/12/21   ? Hand Dominance Right   ? Prior Therapy none   ?  ? Precautions  ? Precautions ICD/Pacemaker   ? Precaution Comments no driving   ?  ? Restrictions  ? Weight Bearing Restrictions No   ?  ? Balance Screen  ? Has the patient fallen in the past 6 months --   see P.T. eval  ?  ? Home  Environment  ? Bathroom Shower/Tub Tub/Shower unit;[place]   ? Additional Comments Pt lives with husband and son in mobile home w/ 4 steps to enter   ? Lives With Spouse   and grown son  ?  ? Prior Function  ? Level of Independence Independent with basic ADLs   ? Vocation Retired   ? 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  ? ADL  ? Eating/Feeding Independent   ? Grooming Independent   ? Upper Body Bathing Independent   ? Lower Body  Bathing Independent   ? Upper Body Dressing Minimal assistance   for bra  ? Lower Body Dressing Minimal assistance   for shoes/socks (for a year) - however she does husbands  ? Toilet Transfer Independent   ? Toileting - Clothing Manipulation Independent   ? Toileting -  Hygiene Independent   ? Tub/Shower Transfer Independent   ?  ? IADL  ? Shopping Needs to be accompanied on any shopping trip   ? Light Housekeeping Performs light daily tasks such as dishwashing, bed making;Does personal laundry completely   husband vacuums  ? Meal Prep Able to complete simple cold meal and snack prep   husband does most of the cooking - for several years  ? Community Mobility Relies on family or friends for transportation   Has not driven for a year  ? Medication Management Is responsible for taking medication in correct dosages at correct time   ? Financial Management --   husband has always done  ?  ? Mobility  ? Mobility Status Independent   ?  ? Written Expression  ? Dominant Hand Right   ? Handwriting 100% legible   ?  ? Vision - History  ? Baseline Vision Bifocals   ? Visual History Cataracts   small  ? Additional Comments Pt reports blurred vision occasional diplopia later in the evening   ?  ? Vision Assessment  ? Ocular Range of Motion Within Functional Limits   ? Tracking/Visual Pursuits Able to track stimulus in all quads without difficulty   ? Convergence Within functional limits   ? Visual Fields No apparent deficits   ? Comment Pt does report bumping into things on Rt side (most likely due to Rt inattention)   ?  ? Activity Tolerance  ? Activity Tolerance Comments Pt reports increased fatigue and has to take more rest breaks   ?  ? Cognition  ? Memory Impaired   ? Memory Impairment --   for approx 3-4 months  ? Cognition Comments unable to subtract by 3's, spells "world" backwards w/ 1 self corrected error, delayed recall 3/3   ?  ? Sensation  ? Light Touch Appears Intact   ?  ? Coordination  ? 9 Hole Peg Test  Right;Left   ? Right 9 Hole Peg Test 31.12 sec   ? Left 9 Hole Peg Test 30.41 sec   ?  ? Edema  ? Edema none in UE's   ?  ?  Tone  ? Assessment Location --   WFL's  ?  ? ROM / Strength  ? AROM / PROM / Strength AROM;Strength   ?  ? AROM  ? Overall AROM Comments BUE AROM WNL's   ?  ? Strength  ? Overall Strength Comments LUE MMT WFL's grossly, RUE unable to tolerate resistance d/t old shoulder injury 3/5   ?  ? Hand Function  ? Right Hand Grip (lbs) 55 lbs   ? Left Hand Grip (lbs) 52 lbs   ? ?  ?  ? ?  ? ? ? ? ? ? ? ? ? ? ? ? ? ? ? ? ? ? ? ? ? OT Short Term Goals - 03/23/21 1015   ? ?  ? OT SHORT TERM GOAL #1  ? Title STG's = LTG's   ? ?  ?  ? ?  ? ? ? ? OT Long Term Goals - 03/23/21 1013   ? ?  ? OT LONG TERM GOAL #1  ? Title Independent with HEP for UE strength/endurance (old Rt shoulder injury)   ? Time 4   ? Period Weeks   ? Status New   ?  ? OT LONG TERM GOAL #2  ? Title Pt to verbalize understanding with visual scanning strategies to increase attention to Rt side   ? Time 4   ? Period Weeks   ? Status New   ?  ? OT LONG TERM GOAL #3  ? Title Pt to perform environmental scanning at 80% or greater accuracy   ? Time 4   ? Period Weeks   ? Status New   ?  ? OT LONG TERM GOAL #4  ? Title Pt to perform 8 minutes of IADLS w/o rest breaks to increase endurance   ? Time 4   ? Period Weeks   ? Status New   ? ?  ?  ? ?  ? ? ? ? ? ? ? ? Plan - 03/23/21 1027   ? ? Clinical Impression Statement SHELVY HECKERT is a 71 y.o. female who presented to ED 01/12/21 with complaints of dizziness, visual changes and speaking deficits. CT demonstrated chronic ischemic infarcts involving the right frontoparietal region, left parietal lobe, and right thalamus. Pt with medical history significant of systolic CHF and ischemic cardiomyopathy with BiV-ICD, afib on eliquis, hx of PE, HLD, HTN, hx of CVA and t2DM. Pt has defribrillator. Pt overall is doing well, however reports decreased endurance and running into things on Rt side. Pt also  has old Rt shoulder injury and chronic cognitive changes. Pt would benefit from short duration of O.T. to address endurance w/ IADLS and Rt inattention.   ? OT Occupational Profile and History Problem Focused Asses

## 2021-03-26 NOTE — Progress Notes (Signed)
I agree with the above plan 

## 2021-03-30 ENCOUNTER — Ambulatory Visit: Payer: Medicare Other | Admitting: Physical Therapy

## 2021-03-30 ENCOUNTER — Encounter: Payer: Self-pay | Admitting: Occupational Therapy

## 2021-03-30 ENCOUNTER — Ambulatory Visit: Payer: Medicare Other | Admitting: Occupational Therapy

## 2021-03-30 ENCOUNTER — Other Ambulatory Visit: Payer: Self-pay

## 2021-03-30 ENCOUNTER — Encounter: Payer: Self-pay | Admitting: Physical Therapy

## 2021-03-30 DIAGNOSIS — R2689 Other abnormalities of gait and mobility: Secondary | ICD-10-CM

## 2021-03-30 DIAGNOSIS — M6281 Muscle weakness (generalized): Secondary | ICD-10-CM

## 2021-03-30 DIAGNOSIS — R414 Neurologic neglect syndrome: Secondary | ICD-10-CM

## 2021-03-30 DIAGNOSIS — R2681 Unsteadiness on feet: Secondary | ICD-10-CM

## 2021-03-30 NOTE — Therapy (Signed)
[facility] ?[place] ?[place], [place], [address] ?Phone: [phone]   Fax:  [phone] ? ?Occupational Therapy Treatment & Discharge ? ?Patient Details  ?Name: Shelley Douglas ?MRN: [id number] ?Date of Birth: [birth year] ?Referring Provider (OT): Tawanna Solo, Amrit ? ? ?Encounter Date: 03/30/2021 ? ? OT End of Session - 03/30/21 0934   ? ? Visit Number 2   ? Number of Visits 7   ? Date for OT Re-Evaluation 04/23/21   ? Authorization Type UHC MCR   ? Progress Note Due on Visit 10   ? OT Start Time 0932   ? OT Stop Time 1017   ? OT Time Calculation (min) 45 min   ? Activity Tolerance Patient tolerated treatment well   ? Behavior During Therapy [facility] for tasks assessed/performed   ? ?  ?  ? ?  ? ? ?OCCUPATIONAL THERAPY DISCHARGE SUMMARY ? ?Visits from Start of Care: 2 ? ?Current functional level related to goals / functional outcomes: ?Pt reports improving with increased independence and endurance but unable to determine in 1 session. ?  ?Remaining deficits: ?Continues with inattention and deficits with overall attention. ?  ?Education / Equipment: ?Education provided today on role and purpose of OT and benefits.  ? ?Patient agrees to discharge. Patient goals were partially met. Patient is being discharged due to the patient's request.. ? ? ? ? ?Past Medical History:  ?Diagnosis Date  ? AICD (automatic cardioverter/defibrillator) present   ? Anxiety   ? pt denies this hx on 05/12/2016  ? CAD (coronary artery disease)   ? last cath 2016  ? Daily headache   ? DVT (deep venous thrombosis) ([place]) 1996  ? High cholesterol   ? History of blood transfusion   ? "related to pregnancy" (05/12/2016)  ? Hypertension   ? "just started taking RX" (05/12/2016)  ? Ischemic cardiomyopathy   ? Left bundle branch block   ? Mitral valve regurgitation   ? Myocardial infarction [facility]) ~ 2009 X2  ? Obesity   ? Persistent atrial fibrillation ([place])   ? Pulmonary embolism ([place]) 1993  ?  Seizures ([place]) 1970s  ? "I had 1; don't know what it was from" (05/12/2016)  ? Stroke [facility]) 2001  ? denies residual on 05/12/2016  ? Type II diabetes mellitus ([place])   ? ? ?Past Surgical History:  ?Procedure Laterality Date  ? BILATERAL CARPAL TUNNEL RELEASE Bilateral 1990s  ? CARDIAC CATHETERIZATION N/A 09/27/2014  ? Procedure: Left Heart Cath and Coronary Angiography;  Surgeon: Dixie Dials, MD;  Location: [facility];  Service: Cardiovascular;  Laterality: N/A;  ? CARDIAC DEFIBRILLATOR PLACEMENT  07/24/2007  ? Medtronic Secura DR implanted in [place] for primary prevention  ? CORONARY ANGIOPLASTY WITH STENT PLACEMENT  ~ 2009  ? CORONARY ARTERY BYPASS GRAFT  ~ 2009  ? DILATION AND CURETTAGE OF UTERUS    ? EP IMPLANTABLE DEVICE N/A 07/01/2015  ? BiV ICD upgrade to [employer] XT CRT-D device for primary prevention  ? EXTERNAL EAR SURGERY Right 1990  ? "skin grafted from my right thigh and put on my right ear"  ? HERNIA REPAIR  1990's   ? "stomach"  ? TUBAL LIGATION    ? [place]  ? ? ?There were no vitals filed for this visit. ? ? Subjective Assessment - 03/30/21 0936   ? ? Subjective  Pt upset that therapist did not get patient until 2 minutes past appt  time and has requested to discharge and reports "i will not be coming back".   ? Pertinent History presented to ED 01/12/21 with complaints of dizziness, visual changes and speaking deficits. CT demonstrated chronic ischemic infarcts involving the right frontoparietal region, left parietal lobe, and right thalamus. Pt with medical history significant of systolic CHF and ischemic cardiomyopathy with BiV-ICD, afib on eliquis, hx of PE, HLD, HTN, hx of CVA and t2DM   ? Currently in Pain? No/denies   ? ?  ?  ? ?  ? ? ? ?Digital/Analog Clocks with visual tabletop scanning. Pt req'd min verbal cues for scanning entire board and table for clocks. Encouraged patient to use organized scanning. Req'd increased time. ? ? ?Environmental Scanning  with 12/15 accuracy = 80% on first pass. Pt required max cues for locating remaining items on second pass. ? ?Arm Bike: for conditioning and reciprocal movements on Level 1 for 6 minutes (forward and backward). Req'd cueing for attending to the time and following directions of 3 minutes forward, 3 minutes backward. Req'd cueing for continuing task as patient would slow down and falling asleep. ? ? ? ? ? ? ?PATIENT EDUCATION: ?Education details: Provided education to patient regarding role and purpose of OT and benefits of continuing therapy. Pt verbalized understanding but continues to request discharge d/t timing of appt and copay. ?Person educated: Patient ?Education method: Explanation ?Education comprehension: verbalized understanding ? ? ? ? ? ? ? ? ? ? ? ? ? ? OT Short Term Goals - 03/23/21 1015   ? ?  ? OT SHORT TERM GOAL #1  ? Title STG's = LTG's   ? ?  ?  ? ?  ? ? ? ? OT Long Term Goals - 03/30/21 0956   ? ?  ? OT LONG TERM GOAL #1  ? Title Independent with HEP for UE strength/endurance (old Rt shoulder injury)   ? Time 4   ? Period Weeks   ? Status Not Met   ?  ? OT LONG TERM GOAL #2  ? Title Pt to verbalize understanding with visual scanning strategies to increase attention to Rt side   ? Time 4   ? Period Weeks   ? Status Partially Met   reviewed on 1st session but did not verbalize or determine understanding fully  ?  ? OT LONG TERM GOAL #3  ? Title Pt to perform environmental scanning at 80% or greater accuracy   ? Time 4   ? Period Weeks   ? Status Achieved   80% on 03/30/21  ?  ? OT LONG TERM GOAL #4  ? Title Pt to perform 8 minutes of IADLS w/o rest breaks to increase endurance   ? Time 4   ? Period Weeks   ? Status Partially Met   pt reports doing tihs but unable to assess in clinic on 1 session and patient with decreased endurance and fatigue with arm bike today.  ? ?  ?  ? ?  ? ? ? ? ? ? ? ? Plan - 03/30/21 0937   ? ? Clinical Impression Statement Reviewed goals - pt verbalized understading but  will be discharging today d/t patient request. OT Discharge today.   ? OT Occupational Profile and History Problem Focused Assessment - Including review of records relating to presenting problem   ? Occupational performance deficits (Please refer to evaluation for details): IADL's   ? Body Structure / Function / Physical Skills IADL;Endurance;Strength   ?  Cognitive Skills Attention   ? Rehab Potential Good   ? Clinical Decision Making Several treatment options, min-mod task modification necessary   ? Comorbidities Affecting Occupational Performance: May have comorbidities impacting occupational performance   ? Modification or Assistance to Complete Evaluation  No modification of tasks or assist necessary to complete eval   ? OT Frequency 2x / week   ? OT Duration 2 weeks   followed by 1x/wk for 2 weeks  ? OT Treatment/Interventions Self-care/ADL training;Therapeutic activities;Coping strategies training;Visual/perceptual remediation/compensation;Patient/family education;Therapeutic exercise;Cognitive remediation/compensation   ? Plan OT Discharge   ? Consulted and Agree with Plan of Care Patient   ? ?  ?  ? ?  ? ? ?Patient will benefit from skilled therapeutic intervention in order to improve the following deficits and impairments:   ?Body Structure / Function / Physical Skills: IADL, Endurance, Strength ?Cognitive Skills: Attention ?  ? ? ?Visit Diagnosis: ?Muscle weakness (generalized) ? ?Other abnormalities of gait and mobility ? ?Neurological neglect syndrome ? ?Unsteadiness on feet ? ? ? ?Problem List ?Patient Active Problem List  ? Diagnosis Date Noted  ? Cerebral thrombosis with cerebral infarction 01/14/2021  ? Stroke-like symptoms with dizziness, visual deficits and difficulty speaking  01/13/2021  ? Type 2 diabetes mellitus without complication ([place]) 70/92/9574  ? Essential hypertension 01/13/2021  ? CAD (coronary artery disease)/HLD 01/13/2021  ? Hypercalcemia 01/13/2021  ? Chronic systolic dysfunction of  left ventricle 07/01/2015  ? Dizziness 09/24/2014  ? Ischemic cardiomyopathy 08/07/2010  ? 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 PULMONARY EMBOLISM 01/29/2010  ? VENTRI

## 2021-03-30 NOTE — Therapy (Signed)
?OUTPATIENT PHYSICAL THERAPY TREATMENT NOTE ? ? ?Patient Name: Shelley Douglas ?MRN: [id number] ?DOB:Feb 11, 1950, 71 y.o., female ?Today's Date: 03/30/2021 ? ?PCP: Elwyn Reach, MD ?REFERRING PROVIDER: Shelly Coss, MD ? ? PT End of Session - 03/30/21 1021   ? ? Visit Number 2   ? Number of Visits 13   ? Date for PT Re-Evaluation 05/22/21   ? Authorization Type UHC medicare so 10th visit progress note   ? PT Start Time 1017   ? PT Stop Time 1058   ? PT Time Calculation (min) 41 min   ? Equipment Utilized During Treatment Gait belt   ? Activity Tolerance Patient tolerated treatment well   ? Behavior During Therapy [facility] for tasks assessed/performed   ? ?  ?  ? ?  ? ? ?Past Medical History:  ?Diagnosis Date  ? AICD (automatic cardioverter/defibrillator) present   ? Anxiety   ? pt denies this hx on 05/12/2016  ? CAD (coronary artery disease)   ? last cath 2016  ? Daily headache   ? DVT (deep venous thrombosis) ([place]) 1996  ? High cholesterol   ? History of blood transfusion   ? "related to pregnancy" (05/12/2016)  ? Hypertension   ? "just started taking RX" (05/12/2016)  ? Ischemic cardiomyopathy   ? Left bundle branch block   ? Mitral valve regurgitation   ? Myocardial infarction [facility]) ~ 2009 X2  ? Obesity   ? Persistent atrial fibrillation ([place])   ? Pulmonary embolism ([place]) 1993  ? Seizures ([place]) 1970s  ? "I had 1; don't know what it was from" (05/12/2016)  ? Stroke [facility]) 2001  ? denies residual on 05/12/2016  ? Type II diabetes mellitus ([place])   ? ?Past Surgical History:  ?Procedure Laterality Date  ? BILATERAL CARPAL TUNNEL RELEASE Bilateral 1990s  ? CARDIAC CATHETERIZATION N/A 09/27/2014  ? Procedure: Left Heart Cath and Coronary Angiography;  Surgeon: Dixie Dials, MD;  Location: [place] [facility];  Service: Cardiovascular;  Laterality: N/A;  ? CARDIAC DEFIBRILLATOR PLACEMENT  07/24/2007  ? Medtronic Secura DR implanted in [place] for primary prevention  ? CORONARY ANGIOPLASTY WITH STENT PLACEMENT  ~ 2009  ? CORONARY  ARTERY BYPASS GRAFT  ~ 2009  ? DILATION AND CURETTAGE OF UTERUS    ? EP IMPLANTABLE DEVICE N/A 07/01/2015  ? BiV ICD upgrade to [employer] XT CRT-D device for primary prevention  ? EXTERNAL EAR SURGERY Right 1990  ? "skin grafted from my right thigh and put on my right ear"  ? HERNIA REPAIR  1990's   ? "stomach"  ? TUBAL LIGATION    ? [place]  ? ?Patient Active Problem List  ? Diagnosis Date Noted  ? Cerebral thrombosis with cerebral infarction 01/14/2021  ? Stroke-like symptoms with dizziness, visual deficits and difficulty speaking  01/13/2021  ? Type 2 diabetes mellitus without complication ([place]) 37/85/8850  ? Essential hypertension 01/13/2021  ? CAD (coronary artery disease)/HLD 01/13/2021  ? Hypercalcemia 01/13/2021  ? Chronic systolic dysfunction of left ventricle 07/01/2015  ? Dizziness 09/24/2014  ? Ischemic cardiomyopathy 08/07/2010  ? 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 PULMONARY EMBOLISM 01/29/2010  ? VENTRICULAR TACHYCARDIA 01/29/2010  ? Atrial fibrillation ([place]) 01/29/2010  ? Chronic systolic congestive heart failure with BiV-ICD 01/29/2010  ? Cerebral artery occlusion with cerebral infarction ([place]) 01/29/2010  ? IMPLANTATION OF DEFIBRILLATOR, HX OF 01/29/2010  ? ? ?REFERRING DIAG: R29.90 (ICD-10-CM) - Stroke-like  symptoms ? ?THERAPY DIAG:  ?Muscle weakness (generalized) ? ?Other abnormalities of gait and mobility ? ?Unsteadiness on feet ? ?PERTINENT HISTORY: [name] is a 71 y.o. female with medical history significant of systolic CHF and ischemic cardiomyopathy with BiV-ICD, afib on eliquis, hx of PE, HLD, HTN, hx of CVA and t2DM who presented to ED with complaints of dizziness, visual changes and speaking deficits. ? ?PRECAUTIONS: Fall and ICD/Pacemaker ? ?SUBJECTIVE: Pt states she is doing better now. ? ?PAIN:  ?Are you having pain? No ? ?Assessed 6MWT, pt required seated rest at 1 min 11 sec until 9mn 51 sec.  Pt  ambulates until 4 min 32 sec.  Unable to continue beyond that. Pt completes 152' + 266'. ?HR 66, SpO2 96%. ?HR 75 bpm, SpO2 97% following second bout. ? ?Assessed FGA =moderate fall risk ? [facility] - 03/30/21 1032   ? ?  ? Functional Gait  Assessment  ? Gait assessed  Yes   ? Gait Level Surface Walks 20 ft in less than 7 sec but greater than 5.5 sec, uses assistive device, slower speed, mild gait deviations, or deviates 6-10 in outside of the 12 in walkway width.   ? Change in Gait Speed Able to smoothly change walking speed without loss of balance or gait deviation. Deviate no more than 6 in outside of the 12 in walkway width.   ? Gait with Horizontal Head Turns Performs head turns smoothly with no change in gait. Deviates no more than 6 in outside 12 in walkway width   ? Gait with Vertical Head Turns Performs head turns with no change in gait. Deviates no more than 6 in outside 12 in walkway width.   ? Gait and Pivot Turn Pivot turns safely within 3 sec and stops quickly with no loss of balance.   ? Step Over Obstacle Is able to step over one shoe box (4.5 in total height) without changing gait speed. No evidence of imbalance.   ? Gait with Narrow Base of Support Ambulates 4-7 steps.   ? Gait with Eyes Closed Cannot walk 20 ft without assistance, severe gait deviations or imbalance, deviates greater than 15 in outside 12 in walkway width or will not attempt task.   ? Ambulating Backwards Walks 20 ft, slow speed, abnormal gait pattern, evidence for imbalance, deviates 10-15 in outside 12 in walkway width.   ? Steps Alternating feet, must use rail.   ? Total Score 20   ? ?  ?  ? ?  ? ?STS x5 UE support from mat ?STS x5 UE from knees ?STS x5 no UE support ?Pt requires inc time to complete task w/ rest between sets ? ?Standing marches no UE support; edu on modifications for safety at home using counter or in front of stable seat ? ?Standing at counter:  tandem x30sec w/ BUE support>no UE support alt LEs; SLS  w/ BUE support>unilateral UE support x30 alt LE, pt has dec glut med activation on L w/ inc noted hip drop, pt able to maintain when pelvic facilitation used to promote proper engagement. ?  ?PATIENT EDUCATION: ?Education details: Initial HEP w/ modifications for safety. ?Person educated: Patient ?Education method: Explanation; demonstration; handout ?Education comprehension: verbalized understanding ?  ?  ?HOME EXERCISE PROGRAM: ?Access Code: [email or web address]?URL: [email or web address] ?Date: 03/30/2021 ?Prepared by: MElease Etienne? ?Exercises ?- Sit to Stand with Arms Crossed  - 1 x daily - 4 x weekly - 3 sets - 5 reps ?- Standing  Hip Flexion March  - 1 x daily - 4 x weekly - 2 sets - 20 reps ?- Standing Single Leg Stance with Counter Support  - 1 x daily - 4 x weekly - 2 sets - 2 reps - 30seconds  hold ?- Standing Tandem Balance with Counter Support  - 1 x daily - 4 x weekly - 2 sets - 2 reps - 30 seconds hold ? ?OBJECTIVE:  ?  ?DIAGNOSTIC FINDINGS: CT HEAD WITHOUT CONTRAST ?chronic appearing infarcts of the right frontal lobe, left ?parietal lobe, and right thalamus ?  ?COGNITION: ?Overall cognitive status: Impaired: Memory: Deficits difficulty remembering things ?            ?SENSATION: ?WFL ?Light touch intact but pt does report some numbness in toes at times ?Vision: pt reports some double vision at times over the last 3 months or so. Appeared intact with screen. Peripheral vision and smooth pursuit intact. Pt wears glasses for distance and reading ?  ?COORDINATION: ?Intact with RAMs ?  ?EDEMA:  ?Mild pitting edema in legs ?  ?  ?POSTURE: rounded shoulders ?  ?  ?  ?MMT:   ?  ?          UE WFL except for right shoulder pain with resisted flexion with old shoulder fracture  ?Tested sitting: ?MMT Right ?03/23/2021 Left ?03/23/2021  ?Hip flexion 4/5 4/5  ?Hip extension      ?Hip abduction 4/5 4/5  ?Hip adduction 4/5 4/5  ?Hip internal rotation      ?Hip external rotation      ?Knee flexion 4/5 4/5   ?Knee extension 5/5 5/5  ?Ankle dorsiflexion 4/5 4/5  ?Ankle plantarflexion      ?Ankle inversion      ?Ankle eversion      ?(Blank rows = not tested) ?  ?  ?  ?TRANSFERS: ?  ?Sit to stand: SBA uses hands un

## 2021-03-30 NOTE — Patient Instructions (Signed)
Access Code: [email or web address] ?URL: [email or web address] ?Date: 03/30/2021 ?Prepared by: Elease Etienne ? ?Exercises ?- Sit to Stand with Arms Crossed  - 1 x daily - 4 x weekly - 3 sets - 5 reps ?- Standing Hip Flexion March  - 1 x daily - 4 x weekly - 2 sets - 20 reps ?- Standing Single Leg Stance with Counter Support  - 1 x daily - 4 x weekly - 2 sets - 2 reps - 30seconds  hold ?- Standing Tandem Balance with Counter Support  - 1 x daily - 4 x weekly - 2 sets - 2 reps - 30 seconds hold ?

## 2021-03-31 ENCOUNTER — Other Ambulatory Visit: Payer: Self-pay | Admitting: Cardiovascular Disease

## 2021-03-31 DIAGNOSIS — Z1231 Encounter for screening mammogram for malignant neoplasm of breast: Secondary | ICD-10-CM

## 2021-04-01 ENCOUNTER — Ambulatory Visit: Payer: Medicare Other | Admitting: Occupational Therapy

## 2021-04-01 ENCOUNTER — Other Ambulatory Visit: Payer: Self-pay

## 2021-04-01 ENCOUNTER — Ambulatory Visit: Payer: Medicare Other

## 2021-04-01 DIAGNOSIS — R2681 Unsteadiness on feet: Secondary | ICD-10-CM

## 2021-04-01 DIAGNOSIS — M6281 Muscle weakness (generalized): Secondary | ICD-10-CM

## 2021-04-01 DIAGNOSIS — R2689 Other abnormalities of gait and mobility: Secondary | ICD-10-CM | POA: Diagnosis not present

## 2021-04-01 NOTE — Therapy (Signed)
?OUTPATIENT PHYSICAL THERAPY TREATMENT NOTE ? ? ?Patient Name: Shelley Douglas ?MRN: [id number] ?DOB:07-05-1950, 71 y.o., female ?Today's Date: 04/01/2021 ? ?PCP: Elwyn Reach, MD ?REFERRING PROVIDER: Elwyn Reach, MD ? ? PT End of Session - 04/01/21 0933   ? ? Visit Number 3   ? Number of Visits 13   ? Date for PT Re-Evaluation 05/22/21   ? Authorization Type UHC medicare so 10th visit progress note   ? PT Start Time 0930   ? PT Stop Time 1012   ? PT Time Calculation (min) 42 min   ? Equipment Utilized During Treatment Gait belt   ? Activity Tolerance Patient tolerated treatment well   ? Behavior During Therapy [facility] for tasks assessed/performed   ? ?  ?  ? ?  ? ? ?Past Medical History:  ?Diagnosis Date  ? AICD (automatic cardioverter/defibrillator) present   ? Anxiety   ? pt denies this hx on 05/12/2016  ? CAD (coronary artery disease)   ? last cath 2016  ? Daily headache   ? DVT (deep venous thrombosis) ([place]) 1996  ? High cholesterol   ? History of blood transfusion   ? "related to pregnancy" (05/12/2016)  ? Hypertension   ? "just started taking RX" (05/12/2016)  ? Ischemic cardiomyopathy   ? Left bundle branch block   ? Mitral valve regurgitation   ? Myocardial infarction [facility]) ~ 2009 X2  ? Obesity   ? Persistent atrial fibrillation ([place])   ? Pulmonary embolism ([place]) 1993  ? Seizures ([place]) 1970s  ? "I had 1; don't know what it was from" (05/12/2016)  ? Stroke [facility]) 2001  ? denies residual on 05/12/2016  ? Type II diabetes mellitus ([place])   ? ?Past Surgical History:  ?Procedure Laterality Date  ? BILATERAL CARPAL TUNNEL RELEASE Bilateral 1990s  ? CARDIAC CATHETERIZATION N/A 09/27/2014  ? Procedure: Left Heart Cath and Coronary Angiography;  Surgeon: Dixie Dials, MD;  Location: [place] [facility];  Service: Cardiovascular;  Laterality: N/A;  ? CARDIAC DEFIBRILLATOR PLACEMENT  07/24/2007  ? Medtronic Secura DR implanted in [place] for primary prevention  ? CORONARY ANGIOPLASTY WITH STENT PLACEMENT  ~ 2009  ?  CORONARY ARTERY BYPASS GRAFT  ~ 2009  ? DILATION AND CURETTAGE OF UTERUS    ? EP IMPLANTABLE DEVICE N/A 07/01/2015  ? BiV ICD upgrade to [employer] XT CRT-D device for primary prevention  ? EXTERNAL EAR SURGERY Right 1990  ? "skin grafted from my right thigh and put on my right ear"  ? HERNIA REPAIR  1990's   ? "stomach"  ? TUBAL LIGATION    ? [place]  ? ?Patient Active Problem List  ? Diagnosis Date Noted  ? Cerebral thrombosis with cerebral infarction 01/14/2021  ? Stroke-like symptoms with dizziness, visual deficits and difficulty speaking  01/13/2021  ? Type 2 diabetes mellitus without complication ([place]) 17/61/6073  ? Essential hypertension 01/13/2021  ? CAD (coronary artery disease)/HLD 01/13/2021  ? Hypercalcemia 01/13/2021  ? Chronic systolic dysfunction of left ventricle 07/01/2015  ? Dizziness 09/24/2014  ? Ischemic cardiomyopathy 08/07/2010  ? 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 PULMONARY EMBOLISM 01/29/2010  ? VENTRICULAR TACHYCARDIA 01/29/2010  ? Atrial fibrillation ([place]) 01/29/2010  ? Chronic systolic congestive heart failure with BiV-ICD 01/29/2010  ? Cerebral artery occlusion with cerebral infarction ([place]) 01/29/2010  ? IMPLANTATION OF DEFIBRILLATOR, HX OF 01/29/2010  ? ? ?REFERRING DIAG: R29.90 (ICD-10-CM) -  Stroke-like symptoms ? ?THERAPY DIAG:  ?Other abnormalities of gait and mobility ? ?Muscle weakness (generalized) ? ?Unsteadiness on feet ? ?PERTINENT HISTORY: Shelley Douglas is a 71 y.o. female with medical history significant of systolic CHF and ischemic cardiomyopathy with BiV-ICD, afib on eliquis, hx of PE, HLD, HTN, hx of CVA and t2DM who presented to ED with complaints of dizziness, visual changes and speaking deficits. ? ?PRECAUTIONS: Fall and ICD/Pacemaker ? ?SUBJECTIVE: Pt reports that she still gets winded quickly. Exercises are going ok. ? ?PAIN:  ?Are you having pain? No ? ?Treatment: ?04/01/21: ?NuStep in  intervals to work on improving activity tolerane. 2 min 30 sec at level 4 with BUE and BLUE, 2 min rest, then 2 min back on. Verbal cues to keep steps/minute >50. Pt's HR=70 and O2 sat=94% at beginning then HR=78, O2=97%. RPE got up to 10/10 after first bout and 8/10 2nd bout. ? ?Sit to stand x 5 from mat without hands. ?In // bars: step-ups on 4" step x 5 each leg forward with 1 UE support, lateral x 5 each side with BUE support. ?Standing on rockerboard positioned ant/post trying to maintain level 30 sec x 2, then head nods up/down x 10. Switched board to lateral and repeated excepted head turns x 10. CGA/min assist.  ?  ?PATIENT EDUCATION: ?Education details: To continue with current HEP ?Person educated: Patient ?Education method: Explanation;  ?Education comprehension: verbalized understanding ?  ?  ?HOME EXERCISE PROGRAM: ?Access Code: [email or web address] ? ?  ?  ? ?  ?GOALS: ?Goals reviewed with patient? Yes ?  ?SHORT TERM GOALS: Target date: 04/20/2021 ?  ?Pt will be independent with initial HEP for strengthening and balance to continue gains on own. ?Baseline: initiated 03/30/2021 ?Goal status: INITIAL ?  ?2.  Pt will ambulate >450 feet on 6MWT to demonstrate improved functional endurance for home and community participation. ?Baseline: 418' w/ 1 seated rest break ?Goal status: INITIAL ?  ?3.  Pt will decrease 5 x sit to stand from 19.05 sec to <16 sec for improved balance and functional strength. ?Baseline: 19.05 sec from chair without hands ?Goal status: INITIAL ?  ?4.  Pt will ambulate >300' on varied level surfaces independently for improved household and short community distances. ?Baseline:  ?Goal status: INITIAL ?  ?5.  Pt will improve FGA score to 23/30 in order to demonstrate improved balance and decreased fall risk. ?Baseline: 20/30 ?Goal status: INITIAL ?  ?  ?  ?LONG TERM GOALS: Target date:  05/22/21 ?  ?Pt will be independent with progressive HEP for strengthening, balance and aerobic activity to continue  gains on own. ?Baseline: no current HEP ?Goal status: INITIAL ?  ?2.  Pt will report ability to clean up around home with minimal fatigue for improved function. ?Baseline:  ?Goal status: INITIAL ?  ?3.  Pt will decrease TUG from 18.1 sec to <15 sec for improved balance and decreased fall risk. ?Baseline: 03/23/21 18.1 sec ?Goal status: INITIAL ?  ?4.  Pt will ambulate >500 feet w/o rest break on 6MWT to demonstrate improved functional endurance for home and community participation. ?Baseline: 418' w/ 1 seated rest break ?Goal status: INITIAL ?  ?5.  Pt will improve FGA score to 26/30 in order to demonstrate improved balance and decreased fall risk. ?Baseline: 20/30 ?Goal status: INITIAL ?  ?  ?  ?ASSESSMENT: ?  ?CLINICAL IMPRESSION: ?PT continued to try to work on improving activity tolerance. Good vital sign response on NuStep with intervals but  high RPE ratings. Provided seated rest break as needed throughout session. Tried to work more on functional strengthening with step-ups today. ?  ?  ?OBJECTIVE IMPAIRMENTS Abnormal gait, decreased activity tolerance, decreased balance, decreased cognition, decreased endurance, decreased knowledge of use of DME, decreased mobility, and decreased strength.  ?  ?ACTIVITY LIMITATIONS cleaning, community activity, and laundry.  ?  ?PERSONAL FACTORS Time since onset of injury/illness/exacerbation and 3+ comorbidities:  systolic CHF and ischemic cardiomyopathy with BiV-ICD, afib on eliquis, hx of PE, HLD, HTN, hx of CVA and t2DM   are also affecting patient's functional outcome.  ?  ?  ?REHAB POTENTIAL: Good ?  ?CLINICAL DECISION MAKING: Evolving/moderate complexity ?  ?EVALUATION COMPLEXITY: Moderate ?  ?PLAN: ?PT FREQUENCY: 2x/week ?  ?PT DURATION: 4 weeks, followed by 1x/week for 4 weeks ?  ?PLANNED INTERVENTIONS: Therapeutic exercises, Therapeutic activity, Neuromuscular re-education, Balance training, Gait training, Patient/Family education, Joint mobilization, Stair training,  Vestibular training, and Manual therapy ?  ?PLAN FOR NEXT SESSION: Modify HEP prn for functional strengthening and balance.  Standing tolerance, NuStep for endurance.  Glut strengthening-esp L.  Challenge BOS wid

## 2021-04-06 ENCOUNTER — Ambulatory Visit: Payer: Medicare Other | Admitting: Physical Therapy

## 2021-04-06 ENCOUNTER — Encounter: Payer: Medicare Other | Admitting: Occupational Therapy

## 2021-04-08 ENCOUNTER — Encounter: Payer: Medicare Other | Admitting: Occupational Therapy

## 2021-04-08 ENCOUNTER — Ambulatory Visit: Payer: Medicare Other | Attending: Occupational Therapy

## 2021-04-08 DIAGNOSIS — R2689 Other abnormalities of gait and mobility: Secondary | ICD-10-CM | POA: Diagnosis present

## 2021-04-08 DIAGNOSIS — R2681 Unsteadiness on feet: Secondary | ICD-10-CM | POA: Diagnosis present

## 2021-04-08 DIAGNOSIS — M6281 Muscle weakness (generalized): Secondary | ICD-10-CM | POA: Diagnosis present

## 2021-04-08 NOTE — Therapy (Signed)
?OUTPATIENT PHYSICAL THERAPY TREATMENT NOTE ? ? ?Patient Name: Shelley Douglas ?MRN: [id number] ?DOB:Jan 21, 1950, 71 y.o., female ?Today's Date: 04/08/2021 ? ?PCP: Elwyn Reach, MD ?REFERRING PROVIDER: Elwyn Reach, MD ? ? PT End of Session - 04/08/21 0935   ? ? Visit Number 4   ? Number of Visits 13   ? Date for PT Re-Evaluation 05/22/21   ? Authorization Type [employer] medicare so 10th visit progress note   ? PT Start Time [phone]   ? PT Stop Time 1014   ? PT Time Calculation (min) 41 min   ? Equipment Utilized During Treatment Gait belt   ? Activity Tolerance Patient tolerated treatment well   ? Behavior During Therapy [facility] for tasks assessed/performed   ? ?  ?  ? ?  ? ? ?Past Medical History:  ?Diagnosis Date  ? AICD (automatic cardioverter/defibrillator) present   ? Anxiety   ? pt denies this hx on 05/12/2016  ? CAD (coronary artery disease)   ? last cath 2016  ? Daily headache   ? DVT (deep venous thrombosis) ([place]) 1996  ? High cholesterol   ? History of blood transfusion   ? "related to pregnancy" (05/12/2016)  ? Hypertension   ? "just started taking RX" (05/12/2016)  ? Ischemic cardiomyopathy   ? Left bundle branch block   ? Mitral valve regurgitation   ? Myocardial infarction [facility]) ~ 2009 X2  ? Obesity   ? Persistent atrial fibrillation ([place])   ? Pulmonary embolism ([place]) 1993  ? Seizures ([place]) 1970s  ? "I had 1; don't know what it was from" (05/12/2016)  ? Stroke [facility]) 2001  ? denies residual on 05/12/2016  ? Type II diabetes mellitus ([place])   ? ?Past Surgical History:  ?Procedure Laterality Date  ? BILATERAL CARPAL TUNNEL RELEASE Bilateral 1990s  ? CARDIAC CATHETERIZATION N/A 09/27/2014  ? Procedure: Left Heart Cath and Coronary Angiography;  Surgeon: Dixie Dials, MD;  Location: [place] [facility];  Service: Cardiovascular;  Laterality: N/A;  ? CARDIAC DEFIBRILLATOR PLACEMENT  07/24/2007  ? Medtronic Secura DR implanted in [place] for primary prevention  ? CORONARY ANGIOPLASTY WITH STENT PLACEMENT  ~ 2009  ?  CORONARY ARTERY BYPASS GRAFT  ~ 2009  ? DILATION AND CURETTAGE OF UTERUS    ? EP IMPLANTABLE DEVICE N/A 07/01/2015  ? BiV ICD upgrade to [employer] XT CRT-D device for primary prevention  ? EXTERNAL EAR SURGERY Right 1990  ? "skin grafted from my right thigh and put on my right ear"  ? HERNIA REPAIR  1990's   ? "stomach"  ? TUBAL LIGATION    ? [place]  ? ?Patient Active Problem List  ? Diagnosis Date Noted  ? Cerebral thrombosis with cerebral infarction 01/14/2021  ? Stroke-like symptoms with dizziness, visual deficits and difficulty speaking  01/13/2021  ? Type 2 diabetes mellitus without complication ([place]) 30/16/0109  ? Essential hypertension 01/13/2021  ? CAD (coronary artery disease)/HLD 01/13/2021  ? Hypercalcemia 01/13/2021  ? Chronic systolic dysfunction of left ventricle 07/01/2015  ? Dizziness 09/24/2014  ? Ischemic cardiomyopathy 08/07/2010  ? 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 PULMONARY EMBOLISM 01/29/2010  ? VENTRICULAR TACHYCARDIA 01/29/2010  ? Atrial fibrillation ([place]) 01/29/2010  ? Chronic systolic congestive heart failure with BiV-ICD 01/29/2010  ? Cerebral artery occlusion with cerebral infarction ([place]) 01/29/2010  ? IMPLANTATION OF DEFIBRILLATOR, HX OF 01/29/2010  ? ? ?REFERRING DIAG: R29.90 (ICD-10-CM) -  Stroke-like symptoms ? ?THERAPY DIAG:  ?Other abnormalities of gait and mobility ? ?Muscle weakness (generalized) ? ?Unsteadiness on feet ? ?PERTINENT HISTORY: AINE STRYCHARZ is a 71 y.o. female with medical history significant of systolic CHF and ischemic cardiomyopathy with BiV-ICD, afib on eliquis, hx of PE, HLD, HTN, hx of CVA and t2DM who presented to ED with complaints of dizziness, visual changes and speaking deficits. ? ?PRECAUTIONS: Fall and ICD/Pacemaker ? ?SUBJECTIVE: Pt reports that she had to miss Monday as had diarhea. Had eaten some different food on Sunday at another church. Feeling good today. She feels  that her energy is much better. She has cardiologist appointment today. ? ?PAIN:  ?Are you having pain? No ? ?Treatment: ?04/08/21: ?HR=72, O2 sat=97%, BP=148/82 ?Pt ambulate out side 550' without AD supervision. Able to talk for first half then had more difficulty with increased RR. Pt had increase trendelenberg noted as went on. ?After gait HR=92, O2 sat=99% and increased RR with RPE of 10/10. Improved quickly with sitting to rest. ?In // bars: step-ups on 4" step x 10 each leg without UE support with verbal and tactile cues to tighten gluts to prevent hip from kicking out especially on left. Standing on rockerboard positioned ant/post without UE support x 30 sec x 2, fingertip support alternating taps on cone from rockerboard x 10. Standing on blue mat in staggered stance 30 sec x 2 each position. Pt more challenged with LLE posterior. Marching over blue mat 8' x 4 with verbal cues for slow, controlled movements and to not let left hip kick out. ? ?  ?PATIENT EDUCATION: ?Education details: To continue with current HEP ?Person educated: Patient ?Education method: Explanation;  ?Education comprehension: verbalized understanding ?  ?  ?HOME EXERCISE PROGRAM: ?Access Code: [email or web address] ? ?  ?  ? ?  ?GOALS: ?Goals reviewed with patient? Yes ?  ?SHORT TERM GOALS: Target date: 04/20/2021 ?  ?Pt will be independent with initial HEP for strengthening and balance to continue gains on own. ?Baseline: initiated 03/30/2021 ?Goal status: INITIAL ?  ?2.  Pt will ambulate >450 feet on 6MWT to demonstrate improved functional endurance for home and community participation. ?Baseline: 418' w/ 1 seated rest break ?Goal status: INITIAL ?  ?3.  Pt will decrease 5 x sit to stand from 19.05 sec to <16 sec for improved balance and functional strength. ?Baseline: 19.05 sec from chair without hands ?Goal status: INITIAL ?  ?4.  Pt will ambulate >300' on varied level surfaces independently for improved household and short community  distances. ?Baseline:  ?Goal status: INITIAL ?  ?5.  Pt will improve FGA score to 23/30 in order to demonstrate improved balance and decreased fall risk. ?Baseline: 20/30 ?Goal status: INITIAL ?  ?  ?  ?LONG TERM GOALS: Target date:  05/22/21 ?  ?Pt will be independent with progressive HEP for strengthening, balance and aerobic activity to continue gains on own. ?Baseline: no current HEP ?Goal status: INITIAL ?  ?2.  Pt will report ability to clean up around home with minimal fatigue for improved function. ?Baseline:  ?Goal status: INITIAL ?  ?3.  Pt will decrease TUG from 18.1 sec to <15 sec for improved balance and decreased fall risk. ?Baseline: 03/23/21 18.1 sec ?Goal status: INITIAL ?  ?4.  Pt will ambulate >500 feet w/o rest break on 6MWT to demonstrate improved functional endurance for home and community participation. ?Baseline: 418' w/ 1 seated rest break ?Goal status: INITIAL ?  ?5.  Pt will improve  FGA score to 26/30 in order to demonstrate improved balance and decreased fall risk. ?Baseline: 20/30 ?Goal status: INITIAL ?  ?  ?  ?ASSESSMENT: ?  ?CLINICAL IMPRESSION: ?Pt able to increase gait distance outside today. RPE does continue to get high with longer gait times. Did well with tolerating standing strengthening and balance. Pt was noted to have more trendelenberg on left especially as fatigued and focused on trying to get more glut med activation with activities. ?  ?  ?OBJECTIVE IMPAIRMENTS Abnormal gait, decreased activity tolerance, decreased balance, decreased cognition, decreased endurance, decreased knowledge of use of DME, decreased mobility, and decreased strength.  ?  ?ACTIVITY LIMITATIONS cleaning, community activity, and laundry.  ?  ?PERSONAL FACTORS Time since onset of injury/illness/exacerbation and 3+ comorbidities:  systolic CHF and ischemic cardiomyopathy with BiV-ICD, afib on eliquis, hx of PE, HLD, HTN, hx of CVA and t2DM   are also affecting patient's functional outcome.  ?  ?   ?REHAB POTENTIAL: Good ?  ?CLINICAL DECISION MAKING: Evolving/moderate complexity ?  ?EVALUATION COMPLEXITY: Moderate ?  ?PLAN: ?PT FREQUENCY: 2x/week ?  ?PT DURATION: 4 weeks, followed by 1x/week for 4 weeks ?  ?PLANNED INTERVENTI

## 2021-04-09 ENCOUNTER — Encounter (HOSPITAL_COMMUNITY): Payer: Self-pay | Admitting: Gastroenterology

## 2021-04-09 NOTE — Progress Notes (Signed)
Attempted to obtain medical history via telephone, unable to reach at this time. I left a voicemail to return pre surgical testing department's phone call.  

## 2021-04-10 ENCOUNTER — Ambulatory Visit: Payer: Medicare Other

## 2021-04-13 ENCOUNTER — Ambulatory Visit: Payer: Medicare Other | Admitting: Physical Therapy

## 2021-04-15 ENCOUNTER — Encounter: Payer: Medicare Other | Admitting: Occupational Therapy

## 2021-04-15 ENCOUNTER — Ambulatory Visit: Payer: Medicare Other | Admitting: Physical Therapy

## 2021-04-17 ENCOUNTER — Ambulatory Visit (HOSPITAL_COMMUNITY): Payer: Medicare Other | Admitting: Certified Registered Nurse Anesthetist

## 2021-04-17 ENCOUNTER — Encounter (HOSPITAL_COMMUNITY): Payer: Self-pay | Admitting: Gastroenterology

## 2021-04-17 ENCOUNTER — Encounter (HOSPITAL_COMMUNITY)
Admission: RE | Disposition: A | Discharge status: Home / Self Care | Payer: Self-pay | Source: Home / Self Care | Attending: Gastroenterology

## 2021-04-17 ENCOUNTER — Other Ambulatory Visit: Payer: Self-pay

## 2021-04-17 ENCOUNTER — Ambulatory Visit (HOSPITAL_COMMUNITY)
Admission: RE | Admit: 2021-04-17 | Discharge: 2021-04-17 | Disposition: A | Discharge status: Home / Self Care | Payer: Medicare Other | Attending: Gastroenterology | Admitting: Gastroenterology

## 2021-04-17 ENCOUNTER — Ambulatory Visit (HOSPITAL_BASED_OUTPATIENT_CLINIC_OR_DEPARTMENT_OTHER): Payer: Medicare Other | Admitting: Certified Registered Nurse Anesthetist

## 2021-04-17 DIAGNOSIS — I252 Old myocardial infarction: Secondary | ICD-10-CM | POA: Insufficient documentation

## 2021-04-17 DIAGNOSIS — Z1211 Encounter for screening for malignant neoplasm of colon: Secondary | ICD-10-CM | POA: Diagnosis present

## 2021-04-17 DIAGNOSIS — K5730 Diverticulosis of large intestine without perforation or abscess without bleeding: Secondary | ICD-10-CM | POA: Diagnosis not present

## 2021-04-17 DIAGNOSIS — I509 Heart failure, unspecified: Secondary | ICD-10-CM | POA: Insufficient documentation

## 2021-04-17 DIAGNOSIS — I110 Hypertensive heart disease with heart failure: Secondary | ICD-10-CM | POA: Insufficient documentation

## 2021-04-17 DIAGNOSIS — R569 Unspecified convulsions: Secondary | ICD-10-CM | POA: Insufficient documentation

## 2021-04-17 DIAGNOSIS — E119 Type 2 diabetes mellitus without complications: Secondary | ICD-10-CM | POA: Insufficient documentation

## 2021-04-17 DIAGNOSIS — E785 Hyperlipidemia, unspecified: Secondary | ICD-10-CM | POA: Diagnosis not present

## 2021-04-17 DIAGNOSIS — Z8673 Personal history of transient ischemic attack (TIA), and cerebral infarction without residual deficits: Secondary | ICD-10-CM | POA: Insufficient documentation

## 2021-04-17 DIAGNOSIS — D124 Benign neoplasm of descending colon: Secondary | ICD-10-CM | POA: Diagnosis not present

## 2021-04-17 DIAGNOSIS — I2510 Atherosclerotic heart disease of native coronary artery without angina pectoris: Secondary | ICD-10-CM

## 2021-04-17 DIAGNOSIS — Z87891 Personal history of nicotine dependence: Secondary | ICD-10-CM | POA: Diagnosis not present

## 2021-04-17 DIAGNOSIS — R195 Other fecal abnormalities: Secondary | ICD-10-CM | POA: Diagnosis not present

## 2021-04-17 DIAGNOSIS — Z95810 Presence of automatic (implantable) cardiac defibrillator: Secondary | ICD-10-CM | POA: Insufficient documentation

## 2021-04-17 DIAGNOSIS — K635 Polyp of colon: Secondary | ICD-10-CM

## 2021-04-17 HISTORY — PX: COLONOSCOPY WITH PROPOFOL: SHX5780

## 2021-04-17 HISTORY — PX: POLYPECTOMY: SHX5525

## 2021-04-17 LAB — GLUCOSE, CAPILLARY: Glucose-Capillary: 125 mg/dL — ABNORMAL HIGH (ref 70–99)

## 2021-04-17 SURGERY — COLONOSCOPY WITH PROPOFOL
Anesthesia: Monitor Anesthesia Care

## 2021-04-17 MED ORDER — PROPOFOL 500 MG/50ML IV EMUL
INTRAVENOUS | Status: DC | PRN
  Administered 2021-04-17: 150 ug/kg/min via INTRAVENOUS

## 2021-04-17 MED ORDER — SODIUM CHLORIDE 0.9 % IV SOLN: INTRAVENOUS | Status: DC

## 2021-04-17 MED ORDER — LACTATED RINGERS IV SOLN
INTRAVENOUS | Status: DC
Start: 2021-04-17 — End: 2021-04-17

## 2021-04-17 MED ORDER — PROPOFOL 500 MG/50ML IV EMUL
INTRAVENOUS | Status: AC
  Filled 2021-04-17: qty 50

## 2021-04-17 MED ORDER — PROPOFOL 10 MG/ML IV BOLUS
INTRAVENOUS | Status: DC | PRN
  Administered 2021-04-17: 30 mg via INTRAVENOUS

## 2021-04-17 SURGICAL SUPPLY — 22 items

## 2021-04-17 NOTE — Anesthesia Preprocedure Evaluation (Addendum)
Anesthesia Evaluation  ?Patient identified by MRN, date of birth, ID band ?Patient awake ? ? ? ?Reviewed: ?Allergy & Precautions, NPO status , Patient's Chart, lab work & pertinent test results ? ?Airway ?Mallampati: II ? ?TM Distance: >3 FB ?Neck ROM: Full ? ? ? Dental ?no notable dental hx. ? ?  ?Pulmonary ?neg pulmonary ROS, former smoker,  ?  ?Pulmonary exam normal ?breath sounds clear to auscultation ? ? ? ? ? ? Cardiovascular ?hypertension, + CAD, + Past MI and +CHF (EF 30-35%, has been shocked by AICD before (patient states a few years ago))  ?Normal cardiovascular exam+ dysrhythmias (paced) Atrial Fibrillation + Cardiac Defibrillator ?+ Valvular Problems/Murmurs MR  ?Rhythm:Regular Rate:Normal ? ?paced ?  ?Neuro/Psych ? Headaches, Seizures -,  Anxiety CVA negative psych ROS  ? GI/Hepatic ?negative GI ROS, Neg liver ROS,   ?Endo/Other  ?diabetes, Type 2hyperlipidemia ? Renal/GU ?negative Renal ROS  ?negative genitourinary ?  ?Musculoskeletal ?negative musculoskeletal ROS ?(+)  ? Abdominal ?  ?Peds ?negative pediatric ROS ?(+)  Hematology ?negative hematology ROS ?(+)   ?Anesthesia Other Findings ? ? Reproductive/Obstetrics ?negative OB ROS ? ?  ? ? ? ? ? ? ? ? ? ? ? ? ? ?  ?  ? ? ? ? ? ? ?Anesthesia Physical ?Anesthesia Plan ? ?ASA: 4 ? ?Anesthesia Plan: MAC  ? ?Post-op Pain Management:   ? ?Induction: Intravenous ? ?PONV Risk Score and Plan: 3 and Treatment may vary due to age or medical condition, Ondansetron and Propofol infusion ? ?Airway Management Planned: Natural Airway ? ?Additional Equipment: None ? ?Intra-op Plan:  ? ?Post-operative Plan:  ? ?Informed Consent: I have reviewed the patients History and Physical, chart, labs and discussed the procedure including the risks, benefits and alternatives for the proposed anesthesia with the patient or authorized representative who has indicated his/her understanding and acceptance.  ? ? ? ?Dental advisory given ? ?Plan  Discussed with: CRNA, Anesthesiologist and Surgeon ? ?Anesthesia Plan Comments:   ? ? ? ? ? ?Anesthesia Quick Evaluation ? ?

## 2021-04-17 NOTE — Anesthesia Procedure Notes (Signed)
Procedure Name: [place] ?Date/Time: 04/17/2021 11:40 AM ?Performed by: Claudia Desanctis, CRNA ?Pre-anesthesia Checklist: Patient identified, Emergency Drugs available, Suction available and Patient being monitored ?Patient Re-evaluated:Patient Re-evaluated prior to induction ?Oxygen Delivery Method: Simple face mask ? ? ? ? ?

## 2021-04-17 NOTE — Discharge Instructions (Signed)

## 2021-04-17 NOTE — Transfer of Care (Signed)
Immediate Anesthesia Transfer of Care Note ? ?Patient: Shelley Douglas ? ?Procedure(s) Performed: COLONOSCOPY WITH PROPOFOL ?POLYPECTOMY ? ?Patient Location: Endoscopy Unit ? ?Anesthesia Type:MAC ? ?Level of Consciousness: awake and patient cooperative ? ?Airway & Oxygen Therapy: Patient Spontanous Breathing and Patient connected to face mask ? ?Post-op Assessment: Report given to RN and Post -op Vital signs reviewed and stable ? ?Post vital signs: Reviewed and stable ? ?Last Vitals:  ?Vitals Value Taken Time  ?BP 95/37 04/17/21 1205  ?Temp    ?Pulse 70 04/17/21 1206  ?Resp 25 04/17/21 1206  ?SpO2 100 % 04/17/21 1206  ?Vitals shown include unvalidated device data. ? ?Last Pain:  ?Vitals:  ? 04/17/21 1205  ?TempSrc:   ?PainSc: 0-No pain  ?   ? ?  ? ?Complications: No notable events documented. ?

## 2021-04-17 NOTE — Op Note (Signed)
[facility] ?Patient Name: Shelley Douglas ?Procedure Date: 04/17/2021 ?MRN: [id number] ?Attending MD: Carol Ada , MD ?Date of Birth: [birth year] ?CSN: [id number] ?Age: 71 ?Admit Type: Outpatient ?Procedure:                Colonoscopy ?Indications:              Positive Cologuard test ?Providers:                Carol Ada, MD, Dulcy Fanny, Alphonzo Grieve  ?                          Leighton Roach, Technician ?Referring MD:              ?Medicines:                Propofol per Anesthesia ?Complications:            No immediate complications. ?Estimated Blood Loss:     Estimated blood loss: none. ?Procedure:                Pre-Anesthesia Assessment: ?                          - Prior to the procedure, a History and Physical  ?                          was performed, and patient medications and  ?                          allergies were reviewed. The patient's tolerance of  ?                          previous anesthesia was also reviewed. The risks  ?                          and benefits of the procedure and the sedation  ?                          options and risks were discussed with the patient.  ?                          All questions were answered, and informed consent  ?                          was obtained. Prior Anticoagulants: The patient has  ?                          taken no previous anticoagulant or antiplatelet  ?                          agents. ASA Grade Assessment: III - A patient with  ?                          severe systemic disease. After reviewing the risks  ?                          and benefits, the  patient was deemed in  ?                          satisfactory condition to undergo the procedure. ?                          - Sedation was administered by an anesthesia  ?                          professional. Deep sedation was attained. ?                          After obtaining informed consent, the colonoscope  ?                          was passed under direct vision.  Throughout the  ?                          procedure, the patient's blood pressure, pulse, and  ?                          oxygen saturations were monitored continuously. The  ?                          CF-HQ190L ([id number]) Olympus colonoscope was  ?                          introduced through the anus and advanced to the the  ?                          cecum, identified by appendiceal orifice and  ?                          ileocecal valve. The colonoscopy was technically  ?                          difficult and complex due to poor bowel prep.  ?                          Successful completion of the procedure was aided by  ?                          lavage. The patient tolerated the procedure well.  ?                          The quality of the bowel preparation was evaluated  ?                          using the BBPS [facility] Bowel Preparation Scale)  ?                          with scores of: Right Colon = 1 (portion of mucosa  ?  seen, but other areas not well seen due to  ?                          staining, residual stool and/or opaque liquid),  ?                          Transverse Colon = 2 (minor amount of residual  ?                          staining, small fragments of stool and/or opaque  ?                          liquid, but mucosa seen well) and Left Colon = 2  ?                          (minor amount of residual staining, small fragments  ?                          of stool and/or opaque liquid, but mucosa seen  ?                          well). The total BBPS score equals 5. The quality  ?                          of the bowel preparation was fair. The ileocecal  ?                          valve, appendiceal orifice, and rectum were  ?                          photographed. ?Scope In: 11:46:22 AM ?Scope Out: 11:57:21 AM ?Scope Withdrawal Time: 0 hours 7 minutes 41 seconds  ?Total Procedure Duration: 0 hours 10 minutes 59 seconds  ?Findings: ?     Two sessile polyps were found  in the descending colon. The polyps were 3  ?     to 4 mm in size. These polyps were removed with a cold snare. Resection  ?     and retrieval were complete. ?     Scattered small and large-mouthed diverticula were found in the sigmoid  ?     colon. ?     In the cecum and ascending colon there was a large amount of residual  ?     vegetable matter. Attempts were made to clear the material, but it was  ?     not possible. No large abnormalities were identified in this area with  ?     the washings, but a complete visualization was not possible. ?Impression:               - Preparation of the colon was fair. ?                          - Two 3 to 4 mm polyps in the descending colon,  ?  removed with a cold snare. Resected and retrieved. ?                          - Diverticulosis in the sigmoid colon. ?Moderate Sedation: ?     Not Applicable - Patient had care per Anesthesia. ?Recommendation:           - Patient has a contact number available for  ?                          emergencies. The signs and symptoms of potential  ?                          delayed complications were discussed with the  ?                          patient. Return to normal activities tomorrow.  ?                          Written discharge instructions were provided to the  ?                          patient. ?                          - Resume previous diet. ?                          - Continue present medications. ?                          - Await pathology results. ?                          - Repeat colonoscopy at the next available  ?                          appointment for surveillance. TWO DAY prep. ?Procedure Code(s):        --- Professional --- ?                          [phone], Colonoscopy, flexible; with removal of  ?                          tumor(s), polyp(s), or other lesion(s) by snare  ?                          technique ?Diagnosis Code(s):        --- Professional --- ?                          K63.5,  Polyp of colon ?                          R19.5, Other fecal abnormalities ?                          K57.30, Diverticulosis of  large intestine without  ?                          perforation or abscess without bleeding ?CPT copyright 2019 American Medical Association. All rights reserved. ?The codes documented in this report are preliminary and upon coder review may  ?be revised to meet current compliance requirements. ?Carol Ada, MD ?Carol Ada, MD ?04/17/2021 12:09:43 PM ?This report has been signed electronically. ?Number of Addenda: 0 ?

## 2021-04-17 NOTE — Anesthesia Postprocedure Evaluation (Signed)
Anesthesia Post Note ? ?Patient: Shelley Douglas ? ?Procedure(s) Performed: COLONOSCOPY WITH PROPOFOL ?POLYPECTOMY ? ?  ? ?Patient location during evaluation: PACU ?Anesthesia Type: MAC ?Level of consciousness: awake and alert ?Pain management: pain level controlled ?Vital Signs Assessment: post-procedure vital signs reviewed and stable ?Respiratory status: spontaneous breathing and respiratory function stable ?Cardiovascular status: stable ?Postop Assessment: no apparent nausea or vomiting ?Anesthetic complications: no ? ? ?No notable events documented. ? ?Last Vitals:  ?Vitals:  ? 04/17/21 1230 04/17/21 1235  ?BP: (!) 161/71   ?Pulse: 65 66  ?Resp: 17 19  ?Temp:    ?SpO2: 95% 96%  ?  ?Last Pain:  ?Vitals:  ? 04/17/21 1230  ?TempSrc:   ?PainSc: 0-No pain  ? ? ?  ?  ?  ?  ?  ?  ? ?Merlinda Frederick ? ? ? ? ?

## 2021-04-17 NOTE — H&P (Signed)
Shelley Douglas ?HPI: With routine testing the patient was found to have a positive Cologuard test. ? ?Past Medical History:  ?Diagnosis Date  ? AICD (automatic cardioverter/defibrillator) present   ? Anxiety   ? pt denies this hx on 05/12/2016  ? CAD (coronary artery disease)   ? last cath 2016  ? Daily headache   ? DVT (deep venous thrombosis) ([place]) 1996  ? High cholesterol   ? History of blood transfusion   ? "related to pregnancy" (05/12/2016)  ? Hypertension   ? "just started taking RX" (05/12/2016)  ? Ischemic cardiomyopathy   ? Left bundle branch block   ? Mitral valve regurgitation   ? Myocardial infarction [facility]) ~ 2009 X2  ? Obesity   ? Persistent atrial fibrillation ([place])   ? Pulmonary embolism ([place]) 1993  ? Seizures ([place]) 1970s  ? "I had 1; don't know what it was from" (05/12/2016)  ? Stroke [facility]) 2001  ? denies residual on 05/12/2016  ? Type II diabetes mellitus ([place])   ? ? ?Past Surgical History:  ?Procedure Laterality Date  ? BILATERAL CARPAL TUNNEL RELEASE Bilateral 1990s  ? CARDIAC CATHETERIZATION N/A 09/27/2014  ? Procedure: Left Heart Cath and Coronary Angiography;  Surgeon: Dixie Dials, MD;  Location: [place] [facility];  Service: Cardiovascular;  Laterality: N/A;  ? CARDIAC DEFIBRILLATOR PLACEMENT  07/24/2007  ? Medtronic Secura DR implanted in [place] for primary prevention  ? CORONARY ANGIOPLASTY WITH STENT PLACEMENT  ~ 2009  ? CORONARY ARTERY BYPASS GRAFT  ~ 2009  ? DILATION AND CURETTAGE OF UTERUS    ? EP IMPLANTABLE DEVICE N/A 07/01/2015  ? BiV ICD upgrade to [employer] XT CRT-D device for primary prevention  ? EXTERNAL EAR SURGERY Right 1990  ? "skin grafted from my right thigh and put on my right ear"  ? HERNIA REPAIR  1990's   ? "stomach"  ? TUBAL LIGATION    ? [place]  ? ? ?Family History  ?Problem Relation Age of Onset  ? Hypertension Unknown   ? ? ?Social History:  reports that she quit smoking about 33 years ago. Her smoking use included cigarettes. She has  never used smokeless tobacco. She reports that she does not drink alcohol and does not use drugs. ? ?Allergies:  ?Allergies  ?Allergen Reactions  ? Plavix [Clopidogrel Bisulfate] Rash  ? ? ?Medications: Scheduled: ?Continuous: ? ?No results found for this or any previous visit (from the past 24 hour(s)).  ? ?No results found. ? ?ROS:  As stated above in the HPI otherwise negative. ? ?There were no vitals taken for this visit.   ? ?PE: ?Gen: NAD, Alert and Oriented ?HEENT:  Shelley Douglas/AT, EOMI ?Neck: Supple, no LAD ?Lungs: CTA Bilaterally ?CV: RRR without M/G/R ?ABD: Soft, NTND, +BS ?Ext: No C/C/E ? ?Assessment/Plan: ?1) Positive Cologuard - Colonoscopy. ? ?Shelley Douglas D ?04/17/2021, 8:57 AM  ? ? ?  ? ?

## 2021-04-20 ENCOUNTER — Encounter (HOSPITAL_COMMUNITY): Payer: Self-pay | Admitting: Gastroenterology

## 2021-04-20 ENCOUNTER — Ambulatory Visit: Payer: Medicare Other | Admitting: Physical Therapy

## 2021-04-20 ENCOUNTER — Encounter: Payer: Medicare Other | Admitting: Occupational Therapy

## 2021-04-20 LAB — SURGICAL PATHOLOGY

## 2021-04-22 ENCOUNTER — Ambulatory Visit
Admission: RE | Admit: 2021-04-22 | Discharge: 2021-04-22 | Disposition: A | Discharge status: Home / Self Care | Payer: Medicare Other | Source: Ambulatory Visit | Attending: Cardiovascular Disease | Admitting: Cardiovascular Disease

## 2021-04-22 ENCOUNTER — Ambulatory Visit: Payer: Medicare Other | Admitting: Physical Therapy

## 2021-04-22 DIAGNOSIS — Z1231 Encounter for screening mammogram for malignant neoplasm of breast: Secondary | ICD-10-CM

## 2021-04-27 ENCOUNTER — Ambulatory Visit: Payer: Medicare Other

## 2021-05-04 ENCOUNTER — Ambulatory Visit: Payer: Medicare Other | Admitting: Physical Therapy

## 2021-05-11 ENCOUNTER — Ambulatory Visit: Payer: Medicare Other

## 2021-05-18 ENCOUNTER — Ambulatory Visit: Payer: Medicare Other

## 2021-06-23 ENCOUNTER — Telehealth: Payer: Self-pay

## 2021-06-23 NOTE — Telephone Encounter (Signed)
Pt called ICM number to ask about her upcoming appointments.  Advised there are no upcoming [place] appointments and to call the office to schedule.

## 2021-07-06 ENCOUNTER — Encounter (HOSPITAL_COMMUNITY): Payer: Self-pay

## 2021-07-06 ENCOUNTER — Emergency Department (HOSPITAL_COMMUNITY)
Admission: EM | Admit: 2021-07-06 | Discharge: 2021-07-06 | Disposition: A | Discharge status: Home / Self Care | Payer: Medicare Other | Attending: Emergency Medicine | Admitting: Emergency Medicine

## 2021-07-06 ENCOUNTER — Other Ambulatory Visit: Payer: Self-pay

## 2021-07-06 DIAGNOSIS — J449 Chronic obstructive pulmonary disease, unspecified: Secondary | ICD-10-CM | POA: Insufficient documentation

## 2021-07-06 DIAGNOSIS — E119 Type 2 diabetes mellitus without complications: Secondary | ICD-10-CM | POA: Diagnosis not present

## 2021-07-06 DIAGNOSIS — X58XXXA Exposure to other specified factors, initial encounter: Secondary | ICD-10-CM | POA: Diagnosis not present

## 2021-07-06 DIAGNOSIS — Z95810 Presence of automatic (implantable) cardiac defibrillator: Secondary | ICD-10-CM | POA: Insufficient documentation

## 2021-07-06 DIAGNOSIS — S161XXA Strain of muscle, fascia and tendon at neck level, initial encounter: Secondary | ICD-10-CM | POA: Diagnosis not present

## 2021-07-06 DIAGNOSIS — S199XXA Unspecified injury of neck, initial encounter: Secondary | ICD-10-CM | POA: Diagnosis present

## 2021-07-06 DIAGNOSIS — Z955 Presence of coronary angioplasty implant and graft: Secondary | ICD-10-CM | POA: Diagnosis not present

## 2021-07-06 DIAGNOSIS — Z87891 Personal history of nicotine dependence: Secondary | ICD-10-CM | POA: Insufficient documentation

## 2021-07-06 DIAGNOSIS — I2510 Atherosclerotic heart disease of native coronary artery without angina pectoris: Secondary | ICD-10-CM | POA: Insufficient documentation

## 2021-07-06 DIAGNOSIS — I10 Essential (primary) hypertension: Secondary | ICD-10-CM | POA: Insufficient documentation

## 2021-07-06 MED ORDER — METHOCARBAMOL 500 MG PO TABS: 500.0000 mg | ORAL_TABLET | Freq: Three times a day (TID) | ORAL | 0 refills | Status: DC | PRN

## 2021-07-06 MED ORDER — ACETAMINOPHEN 500 MG PO TABS
1000.0000 mg | ORAL_TABLET | Freq: Once | ORAL | Status: AC
  Administered 2021-07-06: 1000 mg via ORAL
  Filled 2021-07-06: qty 2

## 2021-07-06 MED ORDER — LIDOCAINE 5 % EX PTCH: 1.0000 | MEDICATED_PATCH | CUTANEOUS | 0 refills | Status: DC

## 2021-07-06 MED ORDER — METHOCARBAMOL 500 MG PO TABS
1000.0000 mg | ORAL_TABLET | Freq: Once | ORAL | Status: AC
  Administered 2021-07-06: 1000 mg via ORAL
  Filled 2021-07-06: qty 2

## 2021-07-06 NOTE — ED Provider Notes (Signed)
[facility] Emergency Department Provider Note MRN:  [id number]  Arrival date & time: 07/06/21     Chief Complaint   Neck Injury   History of Present Illness   Shelley Douglas is a 71 y.o. year-old female with a history of COPD presenting to the ED with chief complaint of neck injury.  Patient woke up with some stiffness/soreness to the left side of her neck.  Got worse throughout the day and now she cannot move her neck without significant pain.  Denies numbness or weakness to the arms or legs, no bowel or bladder dysfunction, no trauma, no other complaints.  Review of Systems  A thorough review of systems was obtained and all systems are negative except as noted in the HPI and PMH.   Patient's Health History    Past Medical History:  Diagnosis Date   AICD (automatic cardioverter/defibrillator) present    Anxiety    pt denies this hx on 05/12/2016   CAD (coronary artery disease)    last cath 2016   Daily headache    DVT (deep venous thrombosis) ([place]) 1996   High cholesterol    History of blood transfusion    "related to pregnancy" (05/12/2016)   Hypertension    "just started taking RX" (05/12/2016)   Ischemic cardiomyopathy    Left bundle branch block    Mitral valve regurgitation    Myocardial infarction ([place]) ~ 2009 X2   Obesity    Persistent atrial fibrillation ([place])    Pulmonary embolism ([place]) 1993   Seizures ([place]) 1970s   "I had 1; don't know what it was from" (05/12/2016)   Stroke [facility]) 2001   denies residual on 05/12/2016   Type II diabetes mellitus ([place])     Past Surgical History:  Procedure Laterality Date   BILATERAL CARPAL TUNNEL RELEASE Bilateral 1990s   CARDIAC CATHETERIZATION N/A 09/27/2014   Procedure: Left Heart Cath and Coronary Angiography;  Surgeon: Dixie Dials, MD;  Location: [place] CV LAB;  Service: Cardiovascular;  Laterality: N/A;   CARDIAC DEFIBRILLATOR PLACEMENT  07/24/2007   Medtronic Secura DR implanted in [place] for primary prevention   COLONOSCOPY WITH PROPOFOL N/A 04/17/2021   Procedure: COLONOSCOPY WITH PROPOFOL;  Surgeon: Carol Ada, MD;  Location: [facility];  Service: Endoscopy;  Laterality: N/A;   CORONARY ANGIOPLASTY WITH STENT PLACEMENT  ~ 2009   CORONARY ARTERY BYPASS GRAFT  ~ 2009   DILATION AND CURETTAGE OF UTERUS     EP IMPLANTABLE DEVICE N/A 07/01/2015   BiV ICD upgrade to Medtronic Viva Quad XT CRT-D device for primary prevention   EXTERNAL EAR SURGERY Right 1990   "skin grafted from my right thigh and put on my right ear"   HERNIA REPAIR  1990's    "stomach"   POLYPECTOMY  04/17/2021   Procedure: POLYPECTOMY;  Surgeon: Carol Ada, MD;  Location: [facility];  Service: Endoscopy;;   TUBAL LIGATION     [place]    Family History  Problem Relation Age of Onset   Hypertension Unknown     Social History   Socioeconomic History   Marital status: Married    Spouse name: Not on file   Number of children: Not on file   Years of education: Not on file   Highest education level: Not on file  Occupational History   Occupation: disabled  Tobacco Use   Smoking status: Former    Years: 4.00    Types:  Cigarettes    Quit date: 01/05/1988    Years since quitting: 33.5   Smokeless tobacco: Never   Tobacco comments:    05/12/2016 "someday smoker when I did smoked; never smoked much"  Vaping Use   Vaping Use: Never used  Substance and Sexual Activity   Alcohol use: No   Drug use: No   Sexual activity: Not Currently  Other Topics Concern   Not on file  Social History Narrative   Pt lives in [place] with spouse.   Disabled   Social Determinants of Health   Financial Resource Strain: Not on file  Food Insecurity: Not on file  Transportation Needs: Not on file  Physical Activity: Not on file  Stress: Not on file  Social Connections: Not on file  Intimate Partner Violence: Not on file     Physical Exam   Vitals:   07/06/21 0036  BP:  (!) 141/87  Pulse: 86  Resp: 16  Temp: 98.2 F (36.8 C)  SpO2: 94%    CONSTITUTIONAL: Well-appearing, NAD NEURO/PSYCH:  Alert and oriented x 3, no focal deficits EYES:  eyes equal and reactive ENT/NECK:  no LAD, no JVD CARDIO: Regular rate, well-perfused, normal S1 and S2 PULM:  CTAB no wheezing or rhonchi GI/GU:  non-distended, non-tender MSK/SPINE:  No gross deformities, no edema SKIN:  no rash, atraumatic   *Additional and/or pertinent findings included in MDM below  Diagnostic and Interventional Summary    EKG Interpretation  Date/Time:    Ventricular Rate:    PR Interval:    QRS Duration:   QT Interval:    QTC Calculation:   R Axis:     Text Interpretation:         Labs Reviewed - No data to display  No orders to display    Medications  methocarbamol (ROBAXIN) tablet 1,000 mg (has no administration in time range)  acetaminophen (TYLENOL) tablet 1,000 mg (has no administration in time range)     Procedures  /  Critical Care Procedures  ED Course and Medical Decision Making  Initial Impression and Ddx History and physical is consistent with a muscular strain or spasm of the left trapezius and/or other adjacent neck muscles.  Tender to palpation.  Normal neurological exam, no numbness or weakness to the arms or legs, no trauma, no indication for imaging, appropriate for discharge.  Past medical/surgical history that increases complexity of ED encounter: CAD  Interpretation of Diagnostics Laboratory and/or imaging options to aid in the diagnosis/care of the patient were considered.  After careful history and physical examination, it was determined that there was no indication for diagnostics at this time.  Patient Reassessment and Ultimate Disposition/Management     Discharge home.  Patient management required discussion with the following services or consulting groups:  None  Complexity of Problems Addressed Acute complicated illness or  Injury  Additional Data Reviewed and Analyzed Further history obtained from: None  Additional Factors Impacting ED Encounter Risk Prescriptions  Barth Kirks. [place] Small, [place] [email or web address]  Final Clinical Impressions(s) / ED Diagnoses     ICD-10-CM   1. Acute strain of neck muscle, initial encounter  S16.1XXA       ED Discharge Orders          Ordered    methocarbamol (ROBAXIN) 500 MG tablet  Every 8 hours PRN        07/06/21 0235    lidocaine (LIDODERM) 5 %  Every 24 hours  07/06/21 0235             Discharge Instructions Discussed with and Provided to Patient:    Discharge Instructions      You were evaluated in the Emergency Department and after careful evaluation, we did not find any emergent condition requiring admission or further testing in the hospital.  Your exam/testing today is overall reassuring.  Symptoms seem to be due to a strain or spasm of the muscles in your neck.  Recommend Tylenol as needed for pain.  Can also use the Robaxin muscle relaxer but use caution because it can cause drowsiness.  Also recommend using the numbing patches provided  Please return to the Emergency Department if you experience any worsening of your condition.   Thank you for allowing [place] to be a part of your care.      Maudie Flakes, MD 07/06/21 [phone]

## 2021-07-06 NOTE — ED Triage Notes (Signed)
Pt states that she woke up with neck pain and back pain, denies injury

## 2021-07-06 NOTE — Discharge Instructions (Signed)
You were evaluated in the Emergency Department and after careful evaluation, we did not find any emergent condition requiring admission or further testing in the hospital.  Your exam/testing today is overall reassuring.  Symptoms seem to be due to a strain or spasm of the muscles in your neck.  Recommend Tylenol as needed for pain.  Can also use the Robaxin muscle relaxer but use caution because it can cause drowsiness.  Also recommend using the numbing patches provided  Please return to the Emergency Department if you experience any worsening of your condition.   Thank you for allowing [place] to be a part of your care.

## 2021-07-16 ENCOUNTER — Emergency Department (HOSPITAL_COMMUNITY)
Admission: EM | Admit: 2021-07-16 | Discharge: 2021-07-16 | Disposition: A | Discharge status: Home / Self Care | Payer: Medicare Other | Attending: Emergency Medicine | Admitting: Emergency Medicine

## 2021-07-16 ENCOUNTER — Encounter (HOSPITAL_COMMUNITY): Payer: Self-pay | Admitting: Emergency Medicine

## 2021-07-16 DIAGNOSIS — Z7982 Long term (current) use of aspirin: Secondary | ICD-10-CM | POA: Insufficient documentation

## 2021-07-16 DIAGNOSIS — Z79899 Other long term (current) drug therapy: Secondary | ICD-10-CM | POA: Diagnosis not present

## 2021-07-16 DIAGNOSIS — R0989 Other specified symptoms and signs involving the circulatory and respiratory systems: Secondary | ICD-10-CM | POA: Diagnosis present

## 2021-07-16 MED ORDER — LIDOCAINE VISCOUS HCL 2 % MT SOLN: 10.0000 mL | OROMUCOSAL | 0 refills | Status: DC | PRN

## 2021-07-16 NOTE — ED Provider Notes (Signed)
[facility] EMERGENCY DEPARTMENT Provider Note   CSN: [id number] Arrival date & time: 07/16/21  1223     History  No chief complaint on file.   Shelley Douglas is a 71 y.o. female who presents emergency department with a chief complaint of swallowing a fishbone.  This happened earlier this morning around 10 AM when she ate some fish.  She is complaining of fishbone sensation in her throat that will not improve.  She has pain when she swallows, no airway compromise.  HPI     Home Medications Prior to Admission medications   Medication Sig Start Date End Date Taking? Authorizing Provider  amiodarone (PACERONE) 200 MG tablet Take 1 tablet (200 mg total) by mouth daily. Patient taking differently: Take 200 mg by mouth every other day. 01/04/15   Dixie Dials, MD  apixaban (ELIQUIS) 5 MG TABS tablet Take 1 tablet (5 mg total) by mouth 2 (two) times daily. 05/14/16   Dixie Dials, MD  aspirin EC 81 MG tablet Take 1 tablet (81 mg total) by mouth daily. Swallow whole. Patient not taking: Reported on 04/14/2021 01/14/21   Shelly Coss, MD  atorvastatin (LIPITOR) 80 MG tablet Take 1 tablet (80 mg total) by mouth daily. Patient not taking: Reported on 04/14/2021 01/15/21   Shelly Coss, MD  carvedilol (COREG) 25 MG tablet Take 25 mg by mouth 2 (two) times daily. 11/25/17   [provider]  digoxin (LANOXIN) 0.125 MG tablet Take 0.0625 mg by mouth daily. 11/25/17   [provider]  folic acid (FOLVITE) 1 MG tablet Take 1 mg by mouth daily.     [provider]  furosemide (LASIX) 40 MG tablet Take 1 tablet (40 mg total) by mouth every Monday, Wednesday, and Friday. Patient taking differently: Take 40 mg by mouth 2 (two) times daily as needed for edema or fluid. 05/14/16   Dixie Dials, MD  lidocaine (LIDODERM) 5 % Place 1 patch onto the skin daily. Remove & Discard patch within 12 hours or as directed by MD 07/06/21   Maudie Flakes, MD   losartan (COZAAR) 50 MG tablet Take 50 mg by mouth daily.    [provider]  metFORMIN (GLUCOPHAGE) 500 MG tablet Take 500 mg by mouth 2 (two) times daily with a meal.    [provider]  methocarbamol (ROBAXIN) 500 MG tablet Take 1 tablet (500 mg total) by mouth every 8 (eight) hours as needed for muscle spasms. 07/06/21   Maudie Flakes, MD      Allergies    Plavix [clopidogrel bisulfate]    Review of Systems   Review of Systems  Physical Exam Updated Vital Signs BP (!) 177/83 (BP Location: Right Arm)   Pulse 87   Temp 98.3 F (36.8 C) (Oral)   Resp 16   SpO2 96%  Physical Exam Vitals and nursing note reviewed.  Constitutional:      General: She is not in acute distress.    Appearance: She is well-developed. She is not diaphoretic.  HENT:     Head: Normocephalic and atraumatic.     Right Ear: External ear normal.     Left Ear: External ear normal.     Nose: Nose normal.     Mouth/Throat:     Mouth: Mucous membranes are moist.     Comments: Airway patent, tolerating secretions. No obvious foreign bodies present  Eyes:     General: No scleral icterus.    Conjunctiva/sclera: Conjunctivae normal.  Cardiovascular:     Rate and Rhythm: Normal rate and regular rhythm.     Heart sounds: Normal heart sounds. No murmur heard.    No friction rub. No gallop.  Pulmonary:     Effort: Pulmonary effort is normal. No respiratory distress.     Breath sounds: Normal breath sounds.  Abdominal:     General: Bowel sounds are normal. There is no distension.     Palpations: Abdomen is soft. There is no mass.     Tenderness: There is no abdominal tenderness. There is no guarding.  Musculoskeletal:     Cervical back: Normal range of motion.  Skin:    General: Skin is warm and dry.  Neurological:     Mental Status: She is alert and oriented to person, place, and time.  Psychiatric:        Behavior: Behavior normal.     ED Results / Procedures / Treatments    Labs (all labs ordered are listed, but only abnormal results are displayed) Labs Reviewed - No data to display  EKG None  Radiology No results found.  Procedures Procedures    Medications Ordered in ED Medications - No data to display  ED Course/ Medical Decision Making/ A&P Clinical Course as of 07/16/21 1449  Thu Jul 16, 2021  1449 Dr. Janace Hoard consulted.  He scoped the patient at bedside, no evidence of foreign body in the deeper structures of the throat.  In shared decision making patient be discharged with viscous lidocaine and she will follow-up in the office with him tomorrow [AH]    Clinical Course User Index [AH] Margarita Mail, PA-C                           Medical Decision Making 71 year old female with sensation of foreign body in her throat, no evidence of fishbone in the throat at this time.  Seen and shared visit with Dr. Janace Hoard of ENT who did fiberoptic scope through the nasopharynx, still did not see any evidence of the fishbone.  At this point I feel it is safe to discharge the patient with viscous lidocaine.  She will follow-up in the office with him tomorrow.  No respiratory distress, no evidence of throat swelling, tolerating  secretions.           Final Clinical Impression(s) / ED Diagnoses Final diagnoses:  Foreign body sensation in throat    Rx / DC Orders ED Discharge Orders     None         Margarita Mail, PA-C 07/16/21 1450    Blanchie Dessert, MD 07/16/21 1511

## 2021-07-16 NOTE — Discharge Instructions (Addendum)
Take your viscous lidocaine for treatment of the discomfort in your throat. Return immediately to the emergency department if you have swelling in your throat, difficulty breathing. Maintain a soft diet

## 2021-07-16 NOTE — ED Triage Notes (Signed)
Patient here for evaluation of possibly having a fish bone stuck in her throat, patient states she had fish this morning and now feels pain when she swallows. Denies pain when not swallow, is speaking in complete sentences.

## 2021-07-16 NOTE — Consult Note (Signed)
Reason for Consult:fish bone Referring Physician: Dr Jerene Dilling is an 71 y.o. female.  HPI: hx of one day of fish eating and feeling like something in the throat in the lower portion. She has tried bread. She states the bone was small. She has no pain except swallowing. It has been about 3-4 hours since incident.   Past Medical History:  Diagnosis Date   AICD (automatic cardioverter/defibrillator) present    Anxiety    pt denies this hx on 05/12/2016   CAD (coronary artery disease)    last cath 2016   Daily headache    DVT (deep venous thrombosis) ([place]) 1996   High cholesterol    History of blood transfusion    "related to pregnancy" (05/12/2016)   Hypertension    "just started taking RX" (05/12/2016)   Ischemic cardiomyopathy    Left bundle branch block    Mitral valve regurgitation    Myocardial infarction ([place]) ~ 2009 X2   Obesity    Persistent atrial fibrillation ([place])    Pulmonary embolism ([place]) 1993   Seizures ([place]) 1970s   "I had 1; don't know what it was from" (05/12/2016)   Stroke [facility]) 2001   denies residual on 05/12/2016   Type II diabetes mellitus ([place])     Past Surgical History:  Procedure Laterality Date   BILATERAL CARPAL TUNNEL RELEASE Bilateral 1990s   CARDIAC CATHETERIZATION N/A 09/27/2014   Procedure: Left Heart Cath and Coronary Angiography;  Surgeon: Dixie Dials, MD;  Location: [place] CV LAB;  Service: Cardiovascular;  Laterality: N/A;   CARDIAC DEFIBRILLATOR PLACEMENT  07/24/2007   Medtronic Secura DR implanted in [place] for primary prevention   COLONOSCOPY WITH PROPOFOL N/A 04/17/2021   Procedure: COLONOSCOPY WITH PROPOFOL;  Surgeon: Carol Ada, MD;  [facility];  Service: Endoscopy;  Laterality: N/A;   CORONARY ANGIOPLASTY WITH STENT PLACEMENT  ~ 2009   CORONARY ARTERY BYPASS GRAFT  ~ 2009   DILATION AND CURETTAGE OF UTERUS     EP IMPLANTABLE DEVICE N/A 07/01/2015   BiV ICD upgrade to Medtronic Viva Quad XT CRT-D  device for primary prevention   EXTERNAL EAR SURGERY Right 1990   "skin grafted from my right thigh and put on my right ear"   HERNIA REPAIR  1990's    "stomach"   POLYPECTOMY  04/17/2021   Procedure: POLYPECTOMY;  Surgeon: Carol Ada, MD;  [facility];  Service: Endoscopy;;   TUBAL LIGATION     [place]    Family History  Problem Relation Age of Onset   Hypertension Unknown     Social History:  reports that she quit smoking about 33 years ago. Her smoking use included cigarettes. She has never used smokeless tobacco. She reports that she does not drink alcohol and does not use drugs.  Allergies:  Allergies  Allergen Reactions   Plavix [Clopidogrel Bisulfate] Rash    Medications: I have reviewed the patient's current medications.  No results found for this or any previous visit (from the past 48 hour(s)).  No results found.  ROS Blood pressure (!) 177/83, pulse 87, temperature 98.3 F (36.8 C), temperature source Oral, resp. rate 16, SpO2 96 %. Physical Exam Constitutional:      Appearance: Normal appearance.  HENT:     Head: Normocephalic.     Nose: Nose normal.     Mouth/Throat:     Mouth: Mucous membranes are moist.     Comments:  No evidence of any bone in the oc/op. FOE- there is no bone visualized in any location of the upper airway. TVC move normal. She tolerated procedure well. Eyes:     Pupils: Pupils are equal, round, and reactive to light.  Neurological:     Mental Status: She is alert.       Assessment/Plan: Foreign body in pharynx- there is no evidence of the bone by exam. It could be in the cricopharyngeal area. She has some risk factors for general anesthesia so with a fish bone it may be best to observe for 24 hours to see if the sensation goes away. She will follow up in the office tomorrow at [place] 07/16/2021, 2:32 PM

## 2021-07-17 ENCOUNTER — Ambulatory Visit (INDEPENDENT_AMBULATORY_CARE_PROVIDER_SITE_OTHER): Payer: Medicare Other | Admitting: Otolaryngology

## 2021-07-17 DIAGNOSIS — R0989 Other specified symptoms and signs involving the circulatory and respiratory systems: Secondary | ICD-10-CM

## 2021-07-17 DIAGNOSIS — T17208D Unspecified foreign body in pharynx causing other injury, subsequent encounter: Secondary | ICD-10-CM

## 2021-07-17 NOTE — Progress Notes (Signed)
Shelley Douglas is an 71 y.o. female.   Chief Complaint: fish bone HPI: hx of fish bone in throat and evaluated yesterday. She is here to discuss need for any further intervention. She is resolved of the sore throat and all symptoms. She is eating well.   Past Medical History:  Diagnosis Date   AICD (automatic cardioverter/defibrillator) present    Anxiety    pt denies this hx on 05/12/2016   CAD (coronary artery disease)    last cath 2016   Daily headache    DVT (deep venous thrombosis) ([place]) 1996   High cholesterol    History of blood transfusion    "related to pregnancy" (05/12/2016)   Hypertension    "just started taking RX" (05/12/2016)   Ischemic cardiomyopathy    Left bundle branch block    Mitral valve regurgitation    Myocardial infarction ([place]) ~ 2009 X2   Obesity    Persistent atrial fibrillation ([place])    Pulmonary embolism ([place]) 1993   Seizures ([place]) 1970s   "I had 1; don't know what it was from" (05/12/2016)   Stroke [facility]) 2001   denies residual on 05/12/2016   Type II diabetes mellitus ([place])     Past Surgical History:  Procedure Laterality Date   BILATERAL CARPAL TUNNEL RELEASE Bilateral 1990s   CARDIAC CATHETERIZATION N/A 09/27/2014   Procedure: Left Heart Cath and Coronary Angiography;  Surgeon: Dixie Dials, MD;  Location: [place] [facility] CV LAB;  Service: Cardiovascular;  Laterality: N/A;   CARDIAC DEFIBRILLATOR PLACEMENT  07/24/2007   Medtronic Secura DR implanted in [place] for primary prevention   COLONOSCOPY WITH PROPOFOL N/A 04/17/2021   Procedure: COLONOSCOPY WITH PROPOFOL;  Surgeon: Carol Ada, MD;  [facility];  Service: Endoscopy;  Laterality: N/A;   CORONARY ANGIOPLASTY WITH STENT PLACEMENT  ~ 2009   CORONARY ARTERY BYPASS GRAFT  ~ 2009   DILATION AND CURETTAGE OF UTERUS     EP IMPLANTABLE DEVICE N/A 07/01/2015   BiV ICD upgrade to Medtronic Viva Quad XT CRT-D device for primary prevention   EXTERNAL EAR SURGERY Right 1990   "skin grafted  from my right thigh and put on my right ear"   HERNIA REPAIR  1990's    "stomach"   POLYPECTOMY  04/17/2021   Procedure: POLYPECTOMY;  Surgeon: Carol Ada, MD;  [facility];  Service: Endoscopy;;   TUBAL LIGATION     [place]    Family History  Problem Relation Age of Onset   Hypertension Unknown    Social History:  reports that she quit smoking about 33 years ago. Her smoking use included cigarettes. She has never used smokeless tobacco. She reports that she does not drink alcohol and does not use drugs.  Allergies:  Allergies  Allergen Reactions   Plavix [Clopidogrel Bisulfate] Rash    (Not in a hospital admission)   No results found for this or any previous visit (from the past 48 hour(s)). No results found.   There were no vitals taken for this visit.  PHYSICAL EXAM: Appearance _ awake alert with no distress.  Head- atraumatic and no obvious abnormalities Eyes- PERRL, EOMI, no conjunctiva injection or ecchymosis Ears-  Right- Pinna without inflammation or swelling. canal without obstruction or injury. TM within normal limits  Left- Pinna without inflammation or swelling. canal without obstruction or injury. TM within normal limits Oc/OP- no lesions or excessive swelling. Mouth opening normal.  Hp/Larynx- normal voice and no airway  issues. No swelling or lesions Neck- no mass or lesions. Normal movement.  Neuro- CNII-XII intact, no sensory deficits.  Lungs- normal effort no distress noted     Assessment/Plan Foreign body pharynx- she is completely resolved of the symptoms. She is very happy and will follow up if anything returns.  Melissa Montane 07/17/2021, 8:53 AM

## 2021-09-17 NOTE — Progress Notes (Deleted)
[facility] [address] [place]. [place] [address] [phone]       HOSPITAL FOLLOW UP NOTE  Ms. Shelley Douglas Date of Birth:  July 09, 1950 Medical Record Number:  [id number]   Reason for Referral:  hospital stroke follow up    SUBJECTIVE:   CHIEF COMPLAINT:  No chief complaint on file.   HPI:   [name] is a 72 y.o. who  has a past medical history of AICD (automatic cardioverter/defibrillator) present, Anxiety, CAD (coronary artery disease), Daily headache, DVT (deep venous thrombosis) ([place]) (1996), High cholesterol, History of blood transfusion, Hypertension, Ischemic cardiomyopathy, Left bundle branch block, Mitral valve regurgitation, Myocardial infarction ([place]) (~ 2009 X2), Obesity, Persistent atrial fibrillation ([place]), Pulmonary embolism ([place]) (1993), Seizures ([place]) (1970s), Stroke ([place]) (2001), and Type II diabetes mellitus ([place]).  Patient presented on 01/14/2021 with dizziness, lightheadedness, more so with bending forward, imbalance with walking, left eye blurriness but no vision loss. Personally reviewed hospitalization pertinent progress notes, lab work and imaging.  Evaluated by Dr Erlinda Hong.   Since being home, she reports doing fairly well. No new or worsening symptoms. She denies dizziness or lightheadedness. She reports waxing and waning weakness of right lower extremity (occurring prior to current TIA). Inpatient PT eval recommended outpatient therapy d/t concerns of left sided inattention. Orders were placed to Neuro rehab but patient has not scheduled. She and her husband do not feel it is needed. She denies continued concerns at home. Gait is stable without assistive device. No falls. She reports seeing PCP since hospitalization.  BP is usually around 140-145 sys. Unsure of diastolic. She reports not taking her BP meds this morning as she was under the impression she was having a colonoscopy. Per Epic, colonoscopy scheduled for 04/17/2021.     UPDATE 09/21/2021 ALL:   PERTINENT IMAGING/LABS  Code Stroke CT head No acute abnormality, chronic ischemic infarcts in R frontoparietal region, L parietal lobe, and R thalamus CTA head & neck: negative  CT repeat no acute abnormalities MRI  unable to obtain due to incompatible AICD 2D Echo EF 30-35%   A1C Lab Results  Component Value Date   HGBA1C 7.2 (H) 01/13/2021    Lipid Panel     Component Value Date/Time   CHOL 280 (H) 01/13/2021 1059   TRIG 108 01/13/2021 1059   HDL 63 01/13/2021 1059   CHOLHDL 4.4 01/13/2021 1059   VLDL 22 01/13/2021 1059   LDLCALC 195 (H) 01/13/2021 1059      ROS:   14 system review of systems performed and negative with exception of those listed in HPI  PMH:  Past Medical History:  Diagnosis Date   AICD (automatic cardioverter/defibrillator) present    Anxiety    pt denies this hx on 05/12/2016   CAD (coronary artery disease)    last cath 2016   Daily headache    DVT (deep venous thrombosis) ([place]) 1996   High cholesterol    History of blood transfusion    "related to pregnancy" (05/12/2016)   Hypertension    "just started taking RX" (05/12/2016)   Ischemic cardiomyopathy    Left bundle branch block    Mitral valve regurgitation    Myocardial infarction ([place]) ~ 2009 X2   Obesity    Persistent atrial fibrillation ([place])    Pulmonary embolism ([place]) 1993   Seizures ([place]) 1970s   "I had 1; don't know what it was from" (05/12/2016)   Stroke [facility]) 2001   denies residual on 05/12/2016  Type II diabetes mellitus ([facility])     PSH:  Past Surgical History:  Procedure Laterality Date   BILATERAL CARPAL TUNNEL RELEASE Bilateral 1990s   CARDIAC CATHETERIZATION N/A 09/27/2014   Procedure: Left Heart Cath and Coronary Angiography;  Surgeon: Dixie Dials, MD;  Location: [facility];  Service: Cardiovascular;  Laterality: N/A;   CARDIAC DEFIBRILLATOR PLACEMENT  07/24/2007   Medtronic Secura DR implanted in [place] for primary prevention    COLONOSCOPY WITH PROPOFOL N/A 04/17/2021   Procedure: COLONOSCOPY WITH PROPOFOL;  Surgeon: Carol Ada, MD;  Location: [facility];  Service: Endoscopy;  Laterality: N/A;   CORONARY ANGIOPLASTY WITH STENT PLACEMENT  ~ 2009   CORONARY ARTERY BYPASS GRAFT  ~ 2009   DILATION AND CURETTAGE OF UTERUS     EP IMPLANTABLE DEVICE N/A 07/01/2015   BiV ICD upgrade to Medtronic Viva Quad XT CRT-D device for primary prevention   EXTERNAL EAR SURGERY Right 1990   "skin grafted from my right thigh and put on my right ear"   HERNIA REPAIR  1990's    "stomach"   POLYPECTOMY  04/17/2021   Procedure: POLYPECTOMY;  Surgeon: Carol Ada, MD;  Location: [facility];  Service: Endoscopy;;   TUBAL LIGATION     [place]    Social History:  Social History   Socioeconomic History   Marital status: Married    Spouse name: Not on file   Number of children: Not on file   Years of education: Not on file   Highest education level: Not on file  Occupational History   Occupation: disabled  Tobacco Use   Smoking status: Former    Years: 4.00    Types: Cigarettes    Quit date: 01/05/1988    Years since quitting: 33.7   Smokeless tobacco: Never   Tobacco comments:    05/12/2016 "someday smoker when I did smoked; never smoked much"  Vaping Use   Vaping Use: Never used  Substance and Sexual Activity   Alcohol use: No   Drug use: No   Sexual activity: Not Currently  Other Topics Concern   Not on file  Social History Narrative   Pt lives in [place] with spouse.   Disabled   Social Determinants of Health   Financial Resource Strain: Not on file  Food Insecurity: Not on file  Transportation Needs: Not on file  Physical Activity: Not on file  Stress: Not on file  Social Connections: Not on file  Intimate Partner Violence: Not on file    Family History:  Family History  Problem Relation Age of Onset   Hypertension Unknown     Medications:   Current Outpatient  Medications on File Prior to Visit  Medication Sig Dispense Refill   amiodarone (PACERONE) 200 MG tablet Take 1 tablet (200 mg total) by mouth daily. (Patient taking differently: Take 200 mg by mouth every other day.) 30 tablet 3   apixaban (ELIQUIS) 5 MG TABS tablet Take 1 tablet (5 mg total) by mouth 2 (two) times daily. 60 tablet 3   aspirin EC 81 MG tablet Take 1 tablet (81 mg total) by mouth daily. Swallow whole. 30 tablet 1   atorvastatin (LIPITOR) 80 MG tablet Take 1 tablet (80 mg total) by mouth daily. 30 tablet 1   carvedilol (COREG) 25 MG tablet Take 25 mg by mouth 2 (two) times daily.     digoxin (LANOXIN) 0.125 MG tablet Take 0.0625 mg by mouth daily.  folic acid (FOLVITE) 1 MG tablet Take 1 mg by mouth daily.      furosemide (LASIX) 40 MG tablet Take 1 tablet (40 mg total) by mouth every Monday, Wednesday, and Friday. (Patient taking differently: Take 40 mg by mouth 2 (two) times daily as needed for edema or fluid.)     lidocaine (LIDODERM) 5 % Place 1 patch onto the skin daily. Remove & Discard patch within 12 hours or as directed by MD 5 patch 0   lidocaine (XYLOCAINE) 2 % solution Use as directed 10 mLs in the mouth or throat every 4 (four) hours as needed for mouth pain. 100 mL 0   losartan (COZAAR) 50 MG tablet Take 50 mg by mouth daily.     metFORMIN (GLUCOPHAGE) 500 MG tablet Take 500 mg by mouth 2 (two) times daily with a meal.     methocarbamol (ROBAXIN) 500 MG tablet Take 1 tablet (500 mg total) by mouth every 8 (eight) hours as needed for muscle spasms. 30 tablet 0   No current facility-administered medications on file prior to visit.    Allergies:   Allergies  Allergen Reactions   Plavix [Clopidogrel Bisulfate] Rash      OBJECTIVE:  Physical Exam  There were no vitals filed for this visit.  There is no height or weight on file to calculate BMI. No results found.      No data to display           General: well developed, well nourished, seated, in  no evident distress Head: head normocephalic and atraumatic.   Neck: supple with no carotid or supraclavicular bruits Cardiovascular: regular rate and rhythm, no murmurs Musculoskeletal: no deformity Skin:  no rash/petichiae Vascular:  Normal pulses all extremities   Neurologic Exam Mental Status: Awake and fully alert.  Fluent speech and language.  Oriented to place and time. She does mention that it is 2003 but then able to correct herself and states Biden as current president and spring as current season. Recent and remote memory intact. Attention span, concentration and fund of knowledge appropriate. Mood and affect appropriate.  Cranial Nerves: Fundoscopic exam reveals sharp disc margins. Pupils equal, briskly reactive to light. Extraocular movements full without nystagmus. Visual fields full to confrontation. Hearing intact. Facial sensation intact. Face, tongue, palate moves normally and symmetrically.  Motor: Normal bulk and tone. Normal strength in all tested extremity muscles Sensory.: intact to touch , pinprick , position and vibratory sensation.  Coordination: Rapid alternating movements normal in all extremities. Finger-to-nose and heel-to-shin performed accurately bilaterally. Gait and Station: Arises from chair without difficulty. Stance is normal. Gait demonstrates normal stride length and balance without assistive device.  Reflexes: 1+ and symmetric.    NIHSS  0 Modified Rankin  0    ASSESSMENT: Shelley Douglas is a 71 y.o. year old female with history of systolic CHF and ischemic cardiomyopathy with BiV-ICD, afib on eliquis, hx of PE, HLD, HTN, hx of CVA and T2DM who presented to the ER 01/14/2021 with dizziness, speaking deficits, and visual changes. Vascular risk factors include HLD, HLD, DMTII, advanced age, former smoker, obesity, previous hx CVA/TIA, CAD and CHF.      PLAN:  TIA versus CVA : Residual deficit: none. Continue aspirin 81 mg daily and Eliquis  (apixaban) daily  and atorvastatin '40mg'$  daily  for secondary stroke prevention.  Discussed secondary stroke prevention measures and importance of close PCP follow up for aggressive stroke risk factor management. I have gone over the  pathophysiology of stroke, warning signs and symptoms, risk factors and their management in some detail with instructions to go to the closest emergency room for symptoms of concern. HTN: BP goal <130/90. Elevated in the office, today. She has not taken meds due to anticipation of colonoscopy. Continue amlodipine and carvedilol per PCP.  Patient advised of BP goal and encouraged to call PCP to discuss need for adjustment in medications if BP elevated consistently at home. HLD: LDL goal <70. Recent LDL 195. Continue atorvastatin '40mg'$  daily per PCP. DMII: A1c goal<7.0. Recent A1c 7.2. Continue metformin as directed with PCP.  CAD/CHF: continue digoxin, amiodarone and furosemide for CHF. Continue close follow up with cardiology.  I have provided contact number for PT with neuro rehab as advised in hospital. She and her husband do not feel this is needed but she will continue to discuss with PCP and husband.    Follow up in 6 months or call earlier if needed   CC:  [facility] provider: Dr. Leonie Man PCP: Elwyn Reach, MD    I spent 45 minutes of face-to-face and non-face-to-face time with patient.  This included previsit chart review including review of recent hospitalization, lab review, study review, order entry, electronic health record documentation, patient education regarding recent stroke including etiology, secondary stroke prevention measures and importance of managing stroke risk factors, residual deficits and typical recovery time and answered all other questions to patient satisfaction   Debbora Presto, [facility]  [facility] [address] [place], [place] [address]  Phone [phone] Fax [phone] Note: This document was prepared  with digital dictation and possible smart phrase technology. Any transcriptional errors that result from this process are unintentional.

## 2021-09-21 ENCOUNTER — Ambulatory Visit: Payer: Medicare Other | Admitting: Family Medicine

## 2021-09-21 ENCOUNTER — Encounter: Payer: Self-pay | Admitting: Family Medicine

## 2021-09-21 DIAGNOSIS — G459 Transient cerebral ischemic attack, unspecified: Secondary | ICD-10-CM

## 2021-10-15 ENCOUNTER — Ambulatory Visit: Payer: Medicare Other | Admitting: Interventional Cardiology

## 2021-11-01 NOTE — Progress Notes (Unsigned)
Cardiology Office Note:    Date:  11/04/2021   ID:  Shelley Douglas, DOB March 26, 1950, MRN [id number]  PCP:  Elwyn Reach, MD   [facility] Providers Cardiologist:  None {   Referring MD: Elwyn Reach, MD    History of Present Illness:    Shelley Douglas is a 71 y.o. female with a hx of CAD, history of MV annuloplasty in 1996, DMII, prior CVA, chronic systolic HF, Afib, pulmonary embolism, DVT, and LBBB who was referred by Dr. Jonelle Sidle for further evaluation of systolic HF and CAD.   Patient was seen by Dr. Doylene Canard previously. Per review of the record, patient has history of chronic systolic HF with history of ICD placement. Last cath in 2016 which revealed significant RCA disease. She underwent BMSx2 to prox-mid RCA. Had residual 99% distal RCA disease, 100% RPDA disease. Myoview 11/20218 with inferior infarction but no ischemia.  Last TTE 01/2021 with LVEF 30-35%, G1DD, mild RV dysfunction, mild pulmonary HTN, mild MR, mild to moderate MS.   Today, the patient overall feels well.  She is a difficult historian and has trouble recalling her heart history and procedures that were performed. She confirms that she had open heart surgery in 1996. Suspect this was for MV annuloplasty as her cath in 2016 does not mention grafts. She has a history of HFrEF with history of CAD with RCA stenosis s/p stenting. Currently has ICD in place. She has followed regularly with Dr. Doylene Canard but records not available.  States she has had a recent viral illness from which she is recovering. She also reports that about 2 weeks ago her ICD fired. She followed up with Dr. Doylene Canard per report and her medications were reportedly adjusted but she is not sure of the details. Currently, she denies chest pain, orthopnea, PND or palpitations. Has chronic dyspnea on exertion that is overall stable. No lightheadedness/dizziness. No bleeding on the apixaban. Has been compliant with her medications.  Past  Medical History:  Diagnosis Date   AICD (automatic cardioverter/defibrillator) present    Anxiety    pt denies this hx on 05/12/2016   CAD (coronary artery disease)    last cath 2016   Daily headache    DVT (deep venous thrombosis) ([place]) 1996   High cholesterol    History of blood transfusion    "related to pregnancy" (05/12/2016)   Hypertension    "just started taking RX" (05/12/2016)   Ischemic cardiomyopathy    Left bundle branch block    Mitral valve regurgitation    Myocardial infarction ([place]) ~ 2009 X2   Obesity    Persistent atrial fibrillation ([place])    Pulmonary embolism ([place]) 1993   Seizures ([place]) 1970s   "I had 1; don't know what it was from" (05/12/2016)   Stroke [facility]) 2001   denies residual on 05/12/2016   Type II diabetes mellitus ([place])     Past Surgical History:  Procedure Laterality Date   BILATERAL CARPAL TUNNEL RELEASE Bilateral 1990s   CARDIAC CATHETERIZATION N/A 09/27/2014   Procedure: Left Heart Cath and Coronary Angiography;  Surgeon: Dixie Dials, MD;  Location: [place] [facility];  Service: Cardiovascular;  Laterality: N/A;   CARDIAC DEFIBRILLATOR PLACEMENT  07/24/2007   Medtronic Secura DR implanted in [place] for primary prevention   COLONOSCOPY WITH PROPOFOL N/A 04/17/2021   Procedure: COLONOSCOPY WITH PROPOFOL;  Surgeon: Carol Ada, MD;  Location: [facility];  Service: Endoscopy;  Laterality: N/A;   CORONARY ANGIOPLASTY WITH  STENT PLACEMENT  ~ 2009   CORONARY ARTERY BYPASS GRAFT  ~ 2009   DILATION AND CURETTAGE OF UTERUS     EP IMPLANTABLE DEVICE N/A 07/01/2015   BiV ICD upgrade to Medtronic Viva Quad XT CRT-D device for primary prevention   EXTERNAL EAR SURGERY Right 1990   "skin grafted from my right thigh and put on my right ear"   HERNIA REPAIR  1990's    "stomach"   POLYPECTOMY  04/17/2021   Procedure: POLYPECTOMY;  Surgeon: Carol Ada, MD;  Location: [facility];  Service: Endoscopy;;   TUBAL LIGATION     [place]     Current Medications: Current Meds  Medication Sig   amiodarone (PACERONE) 200 MG tablet Take 1 tablet (200 mg total) by mouth daily.   apixaban (ELIQUIS) 5 MG TABS tablet Take 1 tablet (5 mg total) by mouth 2 (two) times daily.   aspirin EC 81 MG tablet Take 1 tablet (81 mg total) by mouth daily. Swallow whole.   atorvastatin (LIPITOR) 80 MG tablet Take 1 tablet (80 mg total) by mouth daily.   carvedilol (COREG) 25 MG tablet Take 25 mg by mouth 2 (two) times daily.   dapagliflozin propanediol (FARXIGA) 10 MG TABS tablet Take 1 tablet (10 mg total) by mouth daily before breakfast.   digoxin (LANOXIN) 0.125 MG tablet Take 0.0625 mg by mouth daily.   folic acid (FOLVITE) 1 MG tablet Take 1 mg by mouth daily.    furosemide (LASIX) 40 MG tablet Take 1 tablet (40 mg total) by mouth every Monday, Wednesday, and Friday.   lidocaine (LIDODERM) 5 % Place 1 patch onto the skin daily. Remove & Discard patch within 12 hours or as directed by MD   lidocaine (XYLOCAINE) 2 % solution Use as directed 10 mLs in the mouth or throat every 4 (four) hours as needed for mouth pain.   metFORMIN (GLUCOPHAGE) 500 MG tablet Take 500 mg by mouth 2 (two) times daily with a meal.   methocarbamol (ROBAXIN) 500 MG tablet Take 1 tablet (500 mg total) by mouth every 8 (eight) hours as needed for muscle spasms.   sacubitril-valsartan (ENTRESTO) 24-26 MG Take 1 tablet by mouth 2 (two) times daily.   [DISCONTINUED] losartan (COZAAR) 50 MG tablet Take 50 mg by mouth daily.     Allergies:   Plavix [clopidogrel bisulfate]   Social History   Socioeconomic History   Marital status: Married    Spouse name: Not on file   Number of children: Not on file   Years of education: Not on file   Highest education level: Not on file  Occupational History   Occupation: disabled  Tobacco Use   Smoking status: Former    Years: 4.00    Types: Cigarettes    Quit date: 01/05/1988    Years since quitting: 33.8   Smokeless tobacco:  Never   Tobacco comments:    05/12/2016 "someday smoker when I did smoked; never smoked much"  Vaping Use   Vaping Use: Never used  Substance and Sexual Activity   Alcohol use: No   Drug use: No   Sexual activity: Not Currently  Other Topics Concern   Not on file  Social History Narrative   Pt lives in [place] with spouse.   Disabled   Social Determinants of Health   Financial Resource Strain: Not on file  Food Insecurity: Not on file  Transportation Needs: Not on file  Physical Activity: Not on  file  Stress: Not on file  Social Connections: Not on file     Family History: The patient's family history includes Hypertension in her unknown relative.  ROS:   Please see the history of present illness.     All other systems reviewed and are negative.  EKGs/Labs/Other Studies Reviewed:    The following studies were reviewed today: TTE 01-19-21: IMPRESSIONS   1. Left ventricular ejection fraction, by estimation, is 30 to 35%. The  left ventricle has moderately decreased function. The left ventricle  demonstrates regional wall motion abnormalities (see scoring  diagram/findings for description). There is mild  eccentric left ventricular hypertrophy of the infero-lateral segment. Left  ventricular diastolic parameters are consistent with Grade I diastolic  dysfunction (impaired relaxation).   2. Right ventricular systolic function is mildly reduced. The right  ventricular size is normal. There is mildly elevated pulmonary artery  systolic pressure.   3. Left atrial size was moderately dilated.   4. Right atrial size was mildly dilated.   5. The mitral valve is degenerative. Mild mitral valve regurgitation.  Mild to moderate mitral stenosis. Moderate to severe mitral annular  calcification.   6. The aortic valve is tricuspid. Aortic valve regurgitation is not  visualized.   7. The inferior vena cava is normal in size with greater than 50%  respiratory variability,  suggesting right atrial pressure of 3 mmHg.   Myoview 11/2016: FINDINGS: Perfusion: Small apical infarct versus area of apical thinning. Mid severity, moderate-size inferior wall fixed defect is suspicious for infarct. No evidence of reversibility to suggest inducible ischemia.   Wall Motion: Inferior and septal wall centered hypokinesis.   Left Ventricular Ejection Fraction: 36 %   End diastolic volume 527 ml   End systolic volume 782 ml   IMPRESSION: 1. No reversible ischemia.  Inferior infarct.   2. Hypokinesis involving the inferior and septal walls.   3. Left ventricular ejection fraction 36%   4. Non invasive risk stratification*: Intermediate  LHC 09/2014: Prox LAD lesion, 30% stenosed. The lesion was not previously treated. 1st Mrg lesion, 10% stenosed. The lesion was not previously treated. Prox RCA lesion, 70% stenosed. A bare metal stent was placed. The lesion was previously treated with a bare metal stent . Prox RCA to Mid RCA lesion, 80% stenosed. A bare metal stent was placed. The lesion was previously treated with a bare metal stent . Mid RCA to Dist RCA lesion, 99% stenosed. The lesion was not previously treated. Ost RPDA lesion, 100% stenosed. The lesion was not previously treated. Dist RCA lesion, 100% stenosed. The lesion was not previously treated.   Optimal medical treatment with possible evaluation for inferior wall viability.    EKG:  EKG is  ordered today.  The ekg ordered today demonstrates A-sensed, V-paced at HR 85bpm  Recent Labs: 01/12/2021: ALT 11; Hemoglobin 12.7; Platelets 205 01/14/2021: BUN 12; Creatinine, Ser 0.86; Potassium 3.9; Sodium 132  Recent Lipid Panel    Component Value Date/Time   CHOL 280 (H) 01/13/2021 1059   TRIG 108 01/13/2021 1059   HDL 63 01/13/2021 1059   CHOLHDL 4.4 01/13/2021 1059   VLDL 22 01/13/2021 1059   LDLCALC 195 (H) 01/13/2021 1059     Risk Assessment/Calculations:    CHA2DS2-VASc Score = 8  This  indicates a 10.8% annual risk of stroke. The patient's score is based upon: CHF History: 1 HTN History: 1 Diabetes History: 1 Stroke History: 2 Vascular Disease History: 1 Age Score: 1 Gender Score: 1  Physical Exam:    VS:  BP 134/80   Pulse 85   Ht '5\' 6"'$  (1.676 m)   Wt 216 lb (98 kg)   SpO2 95%   BMI 34.86 kg/m     Wt Readings from Last 3 Encounters:  11/04/21 216 lb (98 kg)  04/17/21 217 lb (98.4 kg)  03/16/21 217 lb (98.4 kg)     GEN:  Well nourished, well developed in no acute distress HEENT: Normal NECK: No JVD; No carotid bruits CARDIAC: RRR, 2/6 systolic murmur RESPIRATORY:  Clear to auscultation without rales, wheezing or rhonchi  ABDOMEN: Soft, non-tender, non-distended MUSCULOSKELETAL:  No edema; No deformity  SKIN: Warm and dry NEUROLOGIC:  Alert and oriented x 3 PSYCHIATRIC:  Normal affect   ASSESSMENT:    1. Chronic systolic heart failure ([place])   2. VENTRICULAR TACHYCARDIA   3. Chronic systolic congestive heart failure with BiV-ICD   4. Chronic systolic dysfunction of left ventricle   5. Type 2 diabetes mellitus without complication, with long-term current use of insulin (HCC)   6. Paroxysmal atrial fibrillation ([place])   7. Coronary artery disease of native artery of native heart with stable angina pectoris ([place])   8. Mixed hyperlipidemia    PLAN:    In order of problems listed above:  #Chronic Systolic HF s/p ICD placement: TTE with LVEF 30-35%. 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 Last myoview with inferior infarct but no ischemia. Currently, does not appear overtly overloaded and denies LE edema or orthopnea. Had recent ICD fire and patient was seen by Dr. Doylene Canard with meds adjusted at that time. We will request records, obtain labs and adjust meds as below.  -Obtain records from Dr. Doylene Canard -Check TTE given recent ICD fire -Continue lasix '40mg'$  daily -Continue coreg '25mg'$   BID -Change losartan to entresto 24/'26mg'$  BID -Start farxiga '10mg'$  daily -EP will follow for ICD  #Suspected VT with recent ICD shock: Patient reports recent ICD shock in the setting of known ischemic heart disease and HFrEF. Denies any current chest pain, SOB, orthopnea or LE edema. Followed with Dr. Doylene Canard. Will obtain records and manage HFrEF as above. -Interrogate ICD device -Check BMET, BNP -Continue amiodarone '200mg'$  daily -Continue coreg '25mg'$  BID  #CAD s/p RCA PCI: Patient with history of RCA PCI in 2016. Most recent myoview in 2018 with inferior infarct and no ischemia. Currently denies anginal symptoms.  -Check TTE as above -Continue ASA '81mg'$  daily -Continue lipitor '80mg'$  daily -Continue coreg '25mg'$  BID as above  #HTN: -Continue coreg '25mg'$  BID -Change losartan to entresto 24/'26mg'$  BID  #Paroxysmal Afib: CHADS-vasc 6. On apixaban, amiodarone '200mg'$  and digoxin. -Continue amiodarone '200mg'$  daily -Continue apixaban '5mg'$  BID -Continue digoxin 0.'0625mg'$  daily -Check dig level  #History of MV annuloplasty: #Mild to moderate MS: -Continue serial monitoring   #HLD: -Continue lipitor '80mg'$  daily  #DMII: -Management per PCP          Medication Adjustments/Labs and Tests Ordered: Current medicines are reviewed at length with the patient today.  Concerns regarding medicines are outlined above.  Orders Placed This Encounter  Procedures   Basic metabolic panel   Digoxin level   Pro b natriuretic peptide   EKG 12-Lead   ECHOCARDIOGRAM COMPLETE   Meds ordered this encounter  Medications   sacubitril-valsartan (ENTRESTO) 24-26 MG    Sig: Take 1 tablet by mouth 2 (two) times daily.    Dispense:  60 tablet    Refill:  3  Please Honor Card patient is presenting for Carmie Kanner: [id number]; Juanna Cao: [id number]; SWHQP: OHS; RFFM: [phone]   dapagliflozin propanediol (FARXIGA) 10 MG TABS tablet    Sig: Take 1 tablet (10 mg total) by mouth daily before breakfast.    Dispense:   30 tablet    Refill:  4    Patient Instructions  Medication Instructions:   STOP TAKING LOSARTAN NOW  START TAKING ENTRESTO 24/26 MG DOSE--TAKE ONE TABLET BY MOUTH TWICE DAILY  START TAKING FARXIGA 10 MG BY MOUTH DAILY BEFORE BREAKFAST  *If you need a refill on your cardiac medications before your next appointment, please call your pharmacy*   Lab Work:  TODAY--BMET, DIGOXIN LEVEL, AND PRO-BNP  If you have labs (blood work) drawn today and your tests are completely normal, you will receive your results only by: [place] (if you have MyChart) OR A paper copy in the mail If you have any lab test that is abnormal or we need to change your treatment, we will call you to review the results.   Testing/Procedures:  Your physician has requested that you have an echocardiogram. Echocardiography is a painless test that uses sound waves to create images of your heart. It provides your doctor with information about the size and shape of your heart and how well your heart's chambers and valves are working. This procedure takes approximately one hour. There are no restrictions for this procedure. Please do NOT wear cologne, perfume, aftershave, or lotions (deodorant is allowed). Please arrive 15 minutes prior to your appointment time.    Follow-Up:  3 MONTHS WITH AN EXTENDER IN THE OFFICE   Important Information About Sugar         Signed, Freada Bergeron, MD  11/04/2021 8:27 PM    [place]

## 2021-11-04 ENCOUNTER — Ambulatory Visit: Payer: Medicare Other | Attending: Cardiology | Admitting: Cardiology

## 2021-11-04 ENCOUNTER — Ambulatory Visit (INDEPENDENT_AMBULATORY_CARE_PROVIDER_SITE_OTHER): Payer: Medicare Other

## 2021-11-04 ENCOUNTER — Encounter: Payer: Self-pay | Admitting: Cardiology

## 2021-11-04 VITALS — BP 134/80 | HR 85 | Ht 66.0 in | Wt 216.0 lb

## 2021-11-04 DIAGNOSIS — I4729 Other ventricular tachycardia: Secondary | ICD-10-CM | POA: Diagnosis not present

## 2021-11-04 DIAGNOSIS — E782 Mixed hyperlipidemia: Secondary | ICD-10-CM

## 2021-11-04 DIAGNOSIS — I519 Heart disease, unspecified: Secondary | ICD-10-CM

## 2021-11-04 DIAGNOSIS — E119 Type 2 diabetes mellitus without complications: Secondary | ICD-10-CM | POA: Diagnosis not present

## 2021-11-04 DIAGNOSIS — I5022 Chronic systolic (congestive) heart failure: Secondary | ICD-10-CM | POA: Diagnosis not present

## 2021-11-04 DIAGNOSIS — Z794 Long term (current) use of insulin: Secondary | ICD-10-CM

## 2021-11-04 DIAGNOSIS — I480 Paroxysmal atrial fibrillation: Secondary | ICD-10-CM

## 2021-11-04 DIAGNOSIS — I25118 Atherosclerotic heart disease of native coronary artery with other forms of angina pectoris: Secondary | ICD-10-CM

## 2021-11-04 LAB — CUP PACEART INCLINIC DEVICE CHECK
Battery Remaining Longevity: 10 mo
Battery Voltage: 2.86 V
Brady Statistic AP VP Percent: 1.64 %
Brady Statistic AP VS Percent: 0.02 %
Brady Statistic AS VP Percent: 97.5 %
Brady Statistic AS VS Percent: 0.83 %
Brady Statistic RA Percent Paced: 1.65 %
Brady Statistic RV Percent Paced: 95.69 %
Date Time Interrogation Session: 20231101152928
HighPow Impedance: 42 Ohm
HighPow Impedance: 54 Ohm
Implantable Lead Connection Status: 753985
Implantable Lead Connection Status: 753985
Implantable Lead Connection Status: 753985
Implantable Lead Implant Date: 20090720
Implantable Lead Implant Date: 20090720
Implantable Lead Implant Date: 20170627
Implantable Lead Location: 753858
Implantable Lead Location: 753859
Implantable Lead Location: 753860
Implantable Lead Model: 4598
Implantable Lead Model: 5076
Implantable Lead Model: 6947
Implantable Pulse Generator Implant Date: 20170627
Lead Channel Impedance Value: 304 Ohm
Lead Channel Impedance Value: 304 Ohm
Lead Channel Impedance Value: 380 Ohm
Lead Channel Impedance Value: 437 Ohm
Lead Channel Impedance Value: 437 Ohm
Lead Channel Impedance Value: 437 Ohm
Lead Channel Impedance Value: 589 Ohm
Lead Channel Impedance Value: 646 Ohm
Lead Channel Impedance Value: 646 Ohm
Lead Channel Impedance Value: 722 Ohm
Lead Channel Impedance Value: 855 Ohm
Lead Channel Impedance Value: 950 Ohm
Lead Channel Impedance Value: 969 Ohm
Lead Channel Pacing Threshold Amplitude: 0.625 V
Lead Channel Pacing Threshold Amplitude: 0.75 V
Lead Channel Pacing Threshold Amplitude: 1.5 V
Lead Channel Pacing Threshold Pulse Width: 0.4 ms
Lead Channel Pacing Threshold Pulse Width: 0.4 ms
Lead Channel Pacing Threshold Pulse Width: 0.6 ms
Lead Channel Sensing Intrinsic Amplitude: 4 mV
Lead Channel Sensing Intrinsic Amplitude: 4.125 mV
Lead Channel Sensing Intrinsic Amplitude: 5.375 mV
Lead Channel Sensing Intrinsic Amplitude: 5.5 mV
Lead Channel Setting Pacing Amplitude: 1.5 V
Lead Channel Setting Pacing Amplitude: 2 V
Lead Channel Setting Pacing Amplitude: 2 V
Lead Channel Setting Pacing Pulse Width: 0.4 ms
Lead Channel Setting Pacing Pulse Width: 0.6 ms
Lead Channel Setting Sensing Sensitivity: 0.3 mV
Zone Setting Status: 755011
Zone Setting Status: 755011

## 2021-11-04 MED ORDER — ENTRESTO 24-26 MG PO TABS: 1.0000 | ORAL_TABLET | Freq: Two times a day (BID) | ORAL | 3 refills | Status: DC

## 2021-11-04 MED ORDER — DAPAGLIFLOZIN PROPANEDIOL 10 MG PO TABS: 10.0000 mg | ORAL_TABLET | Freq: Every day | ORAL | 4 refills | Status: DC

## 2021-11-04 NOTE — Patient Instructions (Addendum)
Medication Instructions:   STOP TAKING LOSARTAN NOW  START TAKING ENTRESTO 24/26 MG DOSE--TAKE ONE TABLET BY MOUTH TWICE DAILY  START TAKING FARXIGA 10 MG BY MOUTH DAILY BEFORE BREAKFAST  *If you need a refill on your cardiac medications before your next appointment, please call your pharmacy*   Lab Work:  TODAY--BMET, DIGOXIN LEVEL, AND PRO-BNP  If you have labs (blood work) drawn today and your tests are completely normal, you will receive your results only by: [place] (if you have MyChart) OR A paper copy in the mail If you have any lab test that is abnormal or we need to change your treatment, we will call you to review the results.   Testing/Procedures:  Your physician has requested that you have an echocardiogram. Echocardiography is a painless test that uses sound waves to create images of your heart. It provides your doctor with information about the size and shape of your heart and how well your heart's chambers and valves are working. This procedure takes approximately one hour. There are no restrictions for this procedure. Please do NOT wear cologne, perfume, aftershave, or lotions (deodorant is allowed). Please arrive 15 minutes prior to your appointment time.    Follow-Up:  3 MONTHS WITH AN EXTENDER IN THE OFFICE   Important Information About Sugar

## 2021-11-04 NOTE — Progress Notes (Signed)
CRT-D device check in office. Thresholds and sensing consistent with previous device measurements. Lead impedance trends stable over time. No mode switch episodes recorded. No ventricular arrhythmia episodes recorded. Patient bi-ventricularly pacing 95.7% of the time. Device programmed with appropriate safety margins. Heart failure diagnostics reviewed and trends are stable for patient. No changes made this session. Estimated longevity 10 months.  Patient enrolled in remote follow up. Plan to check device remotely in 3 months and see in office in 6 months. Patient education completed including shock plan.   Patient advised of [place] DMV driving restrictions. Patient voiced understanding.

## 2021-11-05 ENCOUNTER — Telehealth: Payer: Self-pay | Admitting: *Deleted

## 2021-11-05 DIAGNOSIS — I5022 Chronic systolic (congestive) heart failure: Secondary | ICD-10-CM

## 2021-11-05 DIAGNOSIS — I519 Heart disease, unspecified: Secondary | ICD-10-CM

## 2021-11-05 DIAGNOSIS — Z79899 Other long term (current) drug therapy: Secondary | ICD-10-CM

## 2021-11-05 DIAGNOSIS — R7989 Other specified abnormal findings of blood chemistry: Secondary | ICD-10-CM

## 2021-11-05 MED ORDER — FUROSEMIDE 40 MG PO TABS: ORAL_TABLET | ORAL | 0 refills | Status: DC

## 2021-11-05 NOTE — Telephone Encounter (Signed)
-----   Message from Freada Bergeron, MD sent at 11/05/2021  2:17 PM EDT ----- Her fluid levels are elevated. Kidney function and electrolytes look good. It looks like she is taking her lasix '40mg'$  every M,W,F. If this is the case, can we have her take her lasix every day for 5 days and then she can go back to M, W, F with additional doses as needed for swelling/weight gain. Repeat BMET and BNP in 7 days to ensure improved.

## 2021-11-05 NOTE — Telephone Encounter (Signed)
The patient has been notified of the result and verbalized understanding.  All questions (if any) were answered.  Pt aware that per Dr. Johney Frame, we will increase her lasix to taking 40 mg po daily for 5 days only, then decrease back to taking 40 mg po every Monday, Wednesday, and Friday thereafter, and may take an additional dose as needed for swelling/weight gain.    Confirmed the pharmacy of choice with the pt.   Pt aware she will need repeat labs to recheck bmet and PRO-BNP in 7 days, to ensure that she is improved.  Scheduled the pt a lab appt for next Thursday 11/12/21 to recheck BMET and PRO-BNP.   Pt verbalized understanding and agrees with this plan.  Called [facility] and spoke with the Pharmacist there, to phoned in new lasix dosing.  Pharmacist advised to write new lasix dosing as is, so the pt would clearly understand:  increase lasix to taking 40 mg po daily for 5 days only, then decrease back to taking 40 mg po every Monday, Wednesday, and Friday thereafter, and may take an additional dose as needed for swelling/weight gain.     Pharmacist confirmed new lasix dosing for the pt and will fill this accordingly with exact dispense amount.  The Pharmacist said he will council the pt on instructions again, when she comes to pick this up tonight.

## 2021-11-06 LAB — PRO B NATRIURETIC PEPTIDE: NT-Pro BNP: 1479 pg/mL — ABNORMAL HIGH (ref 0–301)

## 2021-11-06 LAB — BASIC METABOLIC PANEL
BUN/Creatinine Ratio: 9 — ABNORMAL LOW (ref 12–28)
BUN: 9 mg/dL (ref 8–27)
CO2: 24 mmol/L (ref 20–29)
Calcium: 10.4 mg/dL — ABNORMAL HIGH (ref 8.7–10.3)
Chloride: 103 mmol/L (ref 96–106)
Creatinine, Ser: 0.97 mg/dL (ref 0.57–1.00)
Glucose: 132 mg/dL — ABNORMAL HIGH (ref 70–99)
Potassium: 4.5 mmol/L (ref 3.5–5.2)
Sodium: 141 mmol/L (ref 134–144)
eGFR: 62 mL/min/{1.73_m2} (ref >59)

## 2021-11-06 LAB — DIGOXIN LEVEL: Digoxin, Serum: <0.4 ng/mL — ABNORMAL LOW (ref 0.5–0.9)

## 2021-11-12 ENCOUNTER — Ambulatory Visit: Payer: Medicare Other | Attending: Internal Medicine

## 2021-11-12 DIAGNOSIS — Z79899 Other long term (current) drug therapy: Secondary | ICD-10-CM

## 2021-11-12 DIAGNOSIS — I519 Heart disease, unspecified: Secondary | ICD-10-CM

## 2021-11-12 DIAGNOSIS — I5022 Chronic systolic (congestive) heart failure: Secondary | ICD-10-CM

## 2021-11-12 DIAGNOSIS — R7989 Other specified abnormal findings of blood chemistry: Secondary | ICD-10-CM

## 2021-11-13 LAB — BASIC METABOLIC PANEL
BUN/Creatinine Ratio: 13 (ref 12–28)
BUN: 14 mg/dL (ref 8–27)
CO2: 25 mmol/L (ref 20–29)
Calcium: 10.7 mg/dL — ABNORMAL HIGH (ref 8.7–10.3)
Chloride: 104 mmol/L (ref 96–106)
Creatinine, Ser: 1.11 mg/dL — ABNORMAL HIGH (ref 0.57–1.00)
Glucose: 146 mg/dL — ABNORMAL HIGH (ref 70–99)
Potassium: 4.1 mmol/L (ref 3.5–5.2)
Sodium: 141 mmol/L (ref 134–144)
eGFR: 53 mL/min/{1.73_m2} — ABNORMAL LOW (ref >59)

## 2021-11-13 LAB — PRO B NATRIURETIC PEPTIDE: NT-Pro BNP: 584 pg/mL — ABNORMAL HIGH (ref 0–301)

## 2021-11-17 ENCOUNTER — Telehealth: Payer: Self-pay | Admitting: Cardiology

## 2021-11-17 ENCOUNTER — Ambulatory Visit (HOSPITAL_COMMUNITY): Payer: Medicare Other | Attending: Cardiology

## 2021-11-17 DIAGNOSIS — I5022 Chronic systolic (congestive) heart failure: Secondary | ICD-10-CM | POA: Diagnosis not present

## 2021-11-17 DIAGNOSIS — I4729 Other ventricular tachycardia: Secondary | ICD-10-CM | POA: Diagnosis not present

## 2021-11-17 DIAGNOSIS — I519 Heart disease, unspecified: Secondary | ICD-10-CM | POA: Insufficient documentation

## 2021-11-17 LAB — ECHOCARDIOGRAM COMPLETE
Area-P 1/2: 2.66 cm2
S' Lateral: 3.8 cm

## 2021-11-17 NOTE — Telephone Encounter (Signed)
Patient returning call for lab results. 

## 2021-11-17 NOTE — Telephone Encounter (Signed)
-----   Message from Nuala Alpha, LPN sent at [id number]/83/2549  8:51 AM EST -----  ----- Message ----- From: Freada Bergeron, MD Sent: 11/14/2021   2:55 PM EST To: [facility] Triage  Kidney function and electrolytes look good. How is she feeling?

## 2021-11-17 NOTE — Telephone Encounter (Signed)
The patient has been notified of the result and verbalized understanding.  All questions (if any) were answered.  Pt states she is feeling much better.  She is able to get out and move around and walk now.  Pt will be here later this afternoon for her scheduled echo.  She is aware we will call her with those results when they come in.   Pt verbalized understanding and agrees with this plan.  Will make Dr. Johney Frame aware that she is feeling better.

## 2021-12-25 ENCOUNTER — Encounter: Payer: Self-pay | Admitting: Cardiology

## 2021-12-25 ENCOUNTER — Ambulatory Visit: Payer: Medicare Other | Attending: Cardiology | Admitting: Cardiology

## 2021-12-25 VITALS — BP 130/78 | HR 64 | Ht 66.0 in | Wt 211.0 lb

## 2021-12-25 DIAGNOSIS — I5022 Chronic systolic (congestive) heart failure: Secondary | ICD-10-CM

## 2021-12-25 DIAGNOSIS — I480 Paroxysmal atrial fibrillation: Secondary | ICD-10-CM | POA: Diagnosis not present

## 2021-12-25 DIAGNOSIS — Z79899 Other long term (current) drug therapy: Secondary | ICD-10-CM | POA: Diagnosis not present

## 2021-12-25 DIAGNOSIS — Z95810 Presence of automatic (implantable) cardiac defibrillator: Secondary | ICD-10-CM

## 2021-12-25 DIAGNOSIS — D6869 Other thrombophilia: Secondary | ICD-10-CM

## 2021-12-25 LAB — HEPATIC FUNCTION PANEL
ALT: 8 IU/L (ref 0–32)
AST: 16 IU/L (ref 0–40)
Albumin: 4.1 g/dL (ref 3.8–4.8)
Alkaline Phosphatase: 95 IU/L (ref 44–121)
Bilirubin Total: 0.3 mg/dL (ref 0.0–1.2)
Bilirubin, Direct: <0.1 mg/dL (ref 0.00–0.40)
Total Protein: 7.1 g/dL (ref 6.0–8.5)

## 2021-12-25 LAB — TSH: TSH: 1.43 u[IU]/mL (ref 0.450–4.500)

## 2021-12-25 NOTE — Patient Instructions (Addendum)
Medication Instructions:  Your physician recommends that you continue on your current medications as directed. Please refer to the Current Medication list given to you today.  *If you need a refill on your cardiac medications before your next appointment, please call your pharmacy*   Lab Work: Today: TSH & LFTs If you have labs (blood work) drawn today and your tests are completely normal, you will receive your results only by: [place] (if you have MyChart) OR A paper copy in the mail If you have any lab test that is abnormal or we need to change your treatment, we will call you to review the results.   Testing/Procedures: None ordered   Follow-Up: At [facility], you and your health needs are our priority.  As part of our continuing mission to provide you with exceptional heart care, we have created designated Provider Care Teams.  These Care Teams include your primary Cardiologist (physician) and Advanced Practice Providers (APPs -  Physician Assistants and Nurse Practitioners) who all work together to provide you with the care you need, when you need it.  We recommend signing up for the patient portal called "MyChart".  Sign up information is provided on this After Visit Summary.  MyChart is used to connect with patients for Virtual Visits (Telemedicine).  Patients are able to view lab/test results, encounter notes, upcoming appointments, etc.  Non-urgent messages can be sent to your provider as well.   To learn more about what you can do with MyChart, go to [email or web address].    Your next appointment:   6 month(s)  The format for your next appointment:   In Person  Provider:   Allegra Lai, MD    Thank you for choosing [facility]!!   Trinidad Curet, RN [phone]  Other Instructions   Important Information About Sugar

## 2021-12-25 NOTE — [place] Notes (Signed)
Electrophysiology Office Note   Date:  12/25/2021   ID:  Shelley Douglas, DOB Sep 12, 1950, MRN [id number]  PCP:  Elwyn Reach, MD  Cardiologist:  Johney Frame Primary Electrophysiologist:  Shelley Finks Meredith Leeds, MD    Chief Complaint: ICD   History of Present Illness: Shelley Douglas is a 71 y.o. female who is being seen today for the evaluation of ICD at the request of Elwyn Reach, MD. Presenting today for electrophysiology evaluation.  She has a history significant for coronary artery disease, mitral valve annuloplasty in 1996, type 2 diabetes, CVA, chronic systolic heart failure, atrial fibrillation, PE, DVT, left bundle branch block.  She is status post Medtronic CRT-D with an upgrade 07/01/2015.  She has bare-metal stent x 2 to the proximal mid RCA.  She had a Myoview in 2018 that showed inferior infarction.  Today, she denies symptoms of palpitations, chest pain, shortness of breath, orthopnea, PND, lower extremity edema, claudication, dizziness, presyncope, syncope, bleeding, or neurologic sequela. The patient is tolerating medications without difficulties.    Past Medical History:  Diagnosis Date   AICD (automatic cardioverter/defibrillator) present    Anxiety    pt denies this hx on 05/12/2016   CAD (coronary artery disease)    last cath 2016   Daily headache    DVT (deep venous thrombosis) ([place]) 1996   High cholesterol    History of blood transfusion    "related to pregnancy" (05/12/2016)   Hypertension    "just started taking RX" (05/12/2016)   Ischemic cardiomyopathy    Left bundle branch block    Mitral valve regurgitation    Myocardial infarction ([place]) ~ 2009 X2   Obesity    Persistent atrial fibrillation ([place])    Pulmonary embolism ([place]) 1993   Seizures ([place]) 1970s   "I had 1; don't know what it was from" (05/12/2016)   Stroke [facility]) 2001   denies residual on 05/12/2016   Type II diabetes mellitus ([place])    Past Surgical History:  Procedure Laterality  Date   BILATERAL CARPAL TUNNEL RELEASE Bilateral 1990s   CARDIAC CATHETERIZATION N/A 09/27/2014   Procedure: Left Heart Cath and Coronary Angiography;  Surgeon: Dixie Dials, MD;  Location: [place] [facility];  Service: Cardiovascular;  Laterality: N/A;   CARDIAC DEFIBRILLATOR PLACEMENT  07/24/2007   Medtronic Secura DR implanted in [place] for primary prevention   COLONOSCOPY WITH PROPOFOL N/A 04/17/2021   Procedure: COLONOSCOPY WITH PROPOFOL;  Surgeon: Carol Ada, MD;  [facility]: [facility];  Service: Endoscopy;  Laterality: N/A;   CORONARY ANGIOPLASTY WITH STENT PLACEMENT  ~ 2009   CORONARY ARTERY BYPASS GRAFT  ~ 2009   DILATION AND CURETTAGE OF UTERUS     EP IMPLANTABLE DEVICE N/A 07/01/2015   BiV ICD upgrade to Medtronic Viva Quad XT CRT-D device for primary prevention   EXTERNAL EAR SURGERY Right 1990   "skin grafted from my right thigh and put on my right ear"   HERNIA REPAIR  1990's    "stomach"   POLYPECTOMY  04/17/2021   Procedure: POLYPECTOMY;  Surgeon: Carol Ada, MD;  [facility]: [facility];  Service: Endoscopy;;   TUBAL LIGATION     VENA CAVA FILTER PLACEMENT  1993     Current Outpatient Medications  Medication Sig Dispense Refill   amiodarone (PACERONE) 200 MG tablet Take 1 tablet (200 mg total) by mouth daily. 30 tablet 3   apixaban (ELIQUIS) 5 MG TABS tablet Take 1 tablet (5 mg total) by mouth 2 (  two) times daily. 60 tablet 3   aspirin EC 81 MG tablet Take 1 tablet (81 mg total) by mouth daily. Swallow whole. 30 tablet 1   atorvastatin (LIPITOR) 80 MG tablet Take 1 tablet (80 mg total) by mouth daily. 30 tablet 1   carvedilol (COREG) 25 MG tablet Take 25 mg by mouth 2 (two) times daily.     dapagliflozin propanediol (FARXIGA) 10 MG TABS tablet Take 1 tablet (10 mg total) by mouth daily before breakfast. 30 tablet 4   digoxin (LANOXIN) 0.125 MG tablet Take 0.0625 mg by mouth daily.     folic acid (FOLVITE) 1 MG tablet Take 1 mg by mouth daily.      furosemide  (LASIX) 40 MG tablet Take 1 tablet (40 mg total) by mouth daily for 5 days only, then decrease to taking 1 tablet (40 mg total) by mouth every Mon, Wed, and Friday thereafter. You may take an additional dose as needed on those days for increased swelling/weight gain. 40 tablet 0   lidocaine (LIDODERM) 5 % Place 1 patch onto the skin daily. Remove & Discard patch within 12 hours or as directed by MD 5 patch 0   lidocaine (XYLOCAINE) 2 % solution Use as directed 10 mLs in the mouth or throat every 4 (four) hours as needed for mouth pain. 100 mL 0   metFORMIN (GLUCOPHAGE) 500 MG tablet Take 500 mg by mouth 2 (two) times daily with a meal.     methocarbamol (ROBAXIN) 500 MG tablet Take 1 tablet (500 mg total) by mouth every 8 (eight) hours as needed for muscle spasms. 30 tablet 0   sacubitril-valsartan (ENTRESTO) 24-26 MG Take 1 tablet by mouth 2 (two) times daily. 60 tablet 3   No current facility-administered medications for this visit.    Allergies:   Plavix [clopidogrel bisulfate]   Social History:  The patient  reports that she quit smoking about 33 years ago. Her smoking use included cigarettes. She has never used smokeless tobacco. She reports that she does not drink alcohol and does not use drugs.   Family History:  The patient's family history includes Hypertension in her unknown relative.    ROS:  Please see the history of present illness.   Otherwise, review of systems is positive for none.   All other systems are reviewed and negative.    PHYSICAL EXAM: VS:  BP 130/78   Pulse 64   Ht '5\' 6"'$  (1.676 m)   Wt 211 lb (95.7 kg)   SpO2 98%   BMI 34.06 kg/m  , BMI Body mass index is 34.06 kg/m. GEN: Well nourished, well developed, in no acute distress  HEENT: normal  Neck: no JVD, carotid bruits, or masses Cardiac: RRR; no murmurs, rubs, or gallops,no edema  Respiratory:  clear to auscultation bilaterally, normal work of breathing GI: soft, nontender, nondistended, + BS MS: no  deformity or atrophy  Skin: warm and dry, device pocket is well healed Neuro:  Strength and sensation are intact Psych: euthymic mood, full affect  EKG:  EKG is ordered today. Personal review of the ekg ordered shows A sense, V pace  Device interrogation is reviewed today in detail.  See PaceArt for details.   Recent Labs: 01/12/2021: ALT 11; Hemoglobin 12.7; Platelets 205 11/12/2021: BUN 14; Creatinine, Ser 1.11; NT-Pro BNP 584; Potassium 4.1; Sodium 141    Lipid Panel     Component Value Date/Time   CHOL 280 (H) 01/13/2021 1059   TRIG 108 01/13/2021  1059   HDL 63 01/13/2021 1059   CHOLHDL 4.4 01/13/2021 1059   VLDL 22 01/13/2021 1059   LDLCALC 195 (H) 01/13/2021 1059     Wt Readings from Last 3 Encounters:  12/25/21 211 lb (95.7 kg)  11/04/21 216 lb (98 kg)  04/17/21 217 lb (98.4 kg)      Other studies Reviewed: Additional studies/ records that were reviewed today include: TTE 11/17/21  Review of the above records today demonstrates:   1. Left ventricular ejection fraction, by estimation, is 30 to 35%. The  left ventricle has moderately decreased function. The left ventricle  demonstrates global hypokinesis. There is mild concentric left ventricular  hypertrophy. Left ventricular  diastolic parameters are indeterminate.   2. Right ventricular systolic function is mildly reduced. The right  ventricular size is mildly enlarged. There is normal pulmonary artery  systolic pressure.   3. Left atrial size was moderately dilated.   4. Right atrial size was moderately dilated.   5. S/p mitral valve annuloplasty. MV mean gradient ~4; mild-moderate  mitral stenosis. Trivial mitral valve regurgitation.   6. The aortic valve was not well visualized. Aortic valve regurgitation  is not visualized.    ASSESSMENT AND PLAN:  1.  Heart failure: Due to ischemic and nonischemic cardiomyopathy.  Ejection fraction 30 to 35%.  Status post Medtronic CRT-D.  Device function  appropriately.  Currently on optimal medical therapy per general cardiology.  2.  Coronary artery disease: Status post RCA PCI.  No current chest pain.  Continue aspirin per primary cardiology  3.  Hypertension: Currently well-controlled  4.  Paroxysmal atrial fibrillation: CHA2DS2-VASc of 6.  Currently on Eliquis 5 mg twice daily, amiodarone 200 mg daily.  Did receive an ICD shock for what appears to be either rapid atrial fibrillation or SVT.  Has since been started on Farxiga.  No changes.  5.  Secondary hypercoagulable state: Currently on Eliquis for atrial fibrillation as above  6.  High risk medication monitoring: Currently on amiodarone as above.  Janey Petron check TSH and LFTs today.  7.  History of mitral valve annuloplasty: Continue monitoring per primary cardiology  8.  Hyperlipidemia: Continue Lipitor 80 mg  Current medicines are reviewed at length with the patient today.   The patient does not have concerns regarding her medicines.  The following changes were made today:  none  Labs/ tests ordered today include:  Orders Placed This Encounter  Procedures   TSH   Hepatic function panel   EKG 12-Lead     Disposition:   FU 6 months  Signed, Eliane Hammersmith Meredith Leeds, MD  12/25/2021 10:19 AM     [facility] [address] [place] [address] [phone] (office) [phone] (fax)

## 2021-12-29 ENCOUNTER — Telehealth: Payer: Self-pay

## 2021-12-29 NOTE — Telephone Encounter (Signed)
I ordered the patient a monitor with the address in Epic. She should receive her new monitor in 7-10 business days.

## 2022-02-07 NOTE — [place] Notes (Deleted)
Office Visit    Patient Name: Shelley Douglas Date of Encounter: 02/07/2022  Primary Care Provider:  Elwyn Reach, MD Primary Cardiologist:  None Primary Electrophysiologist: Will Meredith Leeds, MD  Chief Complaint    Shelley Douglas is a 72 y.o. female with PMH of CAD s/p BMS x 2 to proximal mid RCA, HFrEF, MV annuloplasty 1996,prior CVA (2001), DM type II, PE (2013), A-fib, PE, LBBB s/p CRT-D upgrade 2016 who presents today for follow-up of congestive heart failure.  Past Medical History    Past Medical History:  Diagnosis Date   AICD (automatic cardioverter/defibrillator) present    Anxiety    pt denies this hx on 05/12/2016   CAD (coronary artery disease)    last cath 2016   Daily headache    DVT (deep venous thrombosis) ([place]) 1996   High cholesterol    History of blood transfusion    "related to pregnancy" (05/12/2016)   Hypertension    "just started taking RX" (05/12/2016)   Ischemic cardiomyopathy    Left bundle branch block    Mitral valve regurgitation    Myocardial infarction ([place]) ~ 2009 X2   Obesity    Persistent atrial fibrillation ([place])    Pulmonary embolism ([place]) 1993   Seizures ([place]) 1970s   "I had 1; don't know what it was from" (05/12/2016)   Stroke [facility]) 2001   denies residual on 05/12/2016   Type II diabetes mellitus ([place])    Past Surgical History:  Procedure Laterality Date   BILATERAL CARPAL TUNNEL RELEASE Bilateral 1990s   CARDIAC CATHETERIZATION N/A 09/27/2014   Procedure: Left Heart Cath and Coronary Angiography;  Surgeon: Dixie Dials, MD;  Location: [facility];  Service: Cardiovascular;  Laterality: N/A;   CARDIAC DEFIBRILLATOR PLACEMENT  07/24/2007   Medtronic Secura DR implanted in [place] for primary prevention   COLONOSCOPY WITH PROPOFOL N/A 04/17/2021   Procedure: COLONOSCOPY WITH PROPOFOL;  Surgeon: Carol Ada, MD;  [facility];  Service: Endoscopy;  Laterality: N/A;   CORONARY ANGIOPLASTY WITH STENT  PLACEMENT  ~ 2009   CORONARY ARTERY BYPASS GRAFT  ~ 2009   DILATION AND CURETTAGE OF UTERUS     EP IMPLANTABLE DEVICE N/A 07/01/2015   BiV ICD upgrade to Medtronic Viva Quad XT CRT-D device for primary prevention   EXTERNAL EAR SURGERY Right 1990   "skin grafted from my right thigh and put on my right ear"   HERNIA REPAIR  1990's    "stomach"   POLYPECTOMY  04/17/2021   Procedure: POLYPECTOMY;  Surgeon: Carol Ada, MD;  [facility];  Service: Endoscopy;;   TUBAL LIGATION     VENA CAVA FILTER PLACEMENT  1993    Allergies  Allergies  Allergen Reactions   Plavix [Clopidogrel Bisulfate] Rash    History of Present Illness    Shelley Douglas  is a 72 year old female with the above mention past medical history who presents today for 69-monthfollow-up.  She has significant cardiac history dating back to the 1990s when she underwent open heart surgery for MV annuloplasty.  She was previously followed by Dr. KDoylene Canardand she underwent LHC in 2016 requiring BMS x 2 to proximal mid RCA due to significant RCA disease. She had a Myoview in 2018 that showed inferior infarction.  2D echo was completed 01/13/2021 showing EF of 30-35% with moderately decreased LV function, and RWMA with mild eccentric LVH and grade 1 DD with mildly elevated PA  systolic pressure and moderately dilated LA, mildly dilated RA, mild to moderate MVR with mild to moderate MS with moderate to severe mitral annular calcification.  She was seen by Dr. Johney Frame 11/2021 to establish cardiac care.  She reported ICD firing 2 weeks prior and medications were adjusted by Dr. Doylene Canard.  ICD device was interrogated during visit functioning appropriately.  ICD shock appears to be from rapid atrial fibrillation or SVT.  And med changes were made with SGLT2 added and Entresto started at 24/26 mg.  She was seen by Dr. Curt Bears on 12/25/2021 for EP evaluation.  No medication adjustments were made during visit and device functioning  appropriately.   Since last being seen in the office patient reports***.  Patient denies chest pain, palpitations, dyspnea, PND, orthopnea, nausea, vomiting, dizziness, syncope, edema, weight gain, or early satiety.     ***Notes:  Home Medications    Current Outpatient Medications  Medication Sig Dispense Refill   amiodarone (PACERONE) 200 MG tablet Take 1 tablet (200 mg total) by mouth daily. 30 tablet 3   apixaban (ELIQUIS) 5 MG TABS tablet Take 1 tablet (5 mg total) by mouth 2 (two) times daily. 60 tablet 3   aspirin EC 81 MG tablet Take 1 tablet (81 mg total) by mouth daily. Swallow whole. 30 tablet 1   atorvastatin (LIPITOR) 80 MG tablet Take 1 tablet (80 mg total) by mouth daily. 30 tablet 1   carvedilol (COREG) 25 MG tablet Take 25 mg by mouth 2 (two) times daily.     dapagliflozin propanediol (FARXIGA) 10 MG TABS tablet Take 1 tablet (10 mg total) by mouth daily before breakfast. 30 tablet 4   digoxin (LANOXIN) 0.125 MG tablet Take 0.0625 mg by mouth daily.     folic acid (FOLVITE) 1 MG tablet Take 1 mg by mouth daily.      furosemide (LASIX) 40 MG tablet Take 1 tablet (40 mg total) by mouth daily for 5 days only, then decrease to taking 1 tablet (40 mg total) by mouth every Mon, Wed, and Friday thereafter. You may take an additional dose as needed on those days for increased swelling/weight gain. 40 tablet 0   lidocaine (LIDODERM) 5 % Place 1 patch onto the skin daily. Remove & Discard patch within 12 hours or as directed by MD 5 patch 0   lidocaine (XYLOCAINE) 2 % solution Use as directed 10 mLs in the mouth or throat every 4 (four) hours as needed for mouth pain. 100 mL 0   metFORMIN (GLUCOPHAGE) 500 MG tablet Take 500 mg by mouth 2 (two) times daily with a meal.     methocarbamol (ROBAXIN) 500 MG tablet Take 1 tablet (500 mg total) by mouth every 8 (eight) hours as needed for muscle spasms. 30 tablet 0   sacubitril-valsartan (ENTRESTO) 24-26 MG Take 1 tablet by mouth 2 (two)  times daily. 60 tablet 3   No current facility-administered medications for this visit.     Review of Systems  Please see the history of present illness.    (+)*** (+)***  All other systems reviewed and are otherwise negative except as noted above.  Physical Exam    Wt Readings from Last 3 Encounters:  12/25/21 211 lb (95.7 kg)  11/04/21 216 lb (98 kg)  04/17/21 217 lb (98.4 kg)   [id number] were no vitals filed for this visit.,There is no height or weight on file to calculate BMI.  Constitutional:      Appearance: Healthy appearance. Not in  distress.  Neck:     Vascular: JVD normal.  Pulmonary:     Effort: Pulmonary effort is normal.     Breath sounds: No wheezing. No rales. Diminished in the bases Cardiovascular:     Normal rate. Regular rhythm. Normal S1. Normal S2.      Murmurs: There is no murmur.  Edema:    Peripheral edema absent.  Abdominal:     Palpations: Abdomen is soft non tender. There is no hepatomegaly.  Skin:    General: Skin is warm and dry.  Neurological:     General: No focal deficit present.     Mental Status: Alert and oriented to person, place and time.     Cranial Nerves: Cranial nerves are intact.  EKG/LABS/Other Studies Reviewed    ECG personally reviewed by me today - ***  Risk Assessment/Calculations:   {Does this patient have ATRIAL FIBRILLATION?:[id number]}        Lab Results  Component Value Date   WBC 4.4 01/12/2021   HGB 12.7 01/12/2021   HCT 40.2 01/12/2021   MCV 88.9 01/12/2021   PLT 205 01/12/2021   Lab Results  Component Value Date   CREATININE 1.11 (H) 11/12/2021   BUN 14 11/12/2021   NA 141 11/12/2021   K 4.1 11/12/2021   CL 104 11/12/2021   CO2 25 11/12/2021   Lab Results  Component Value Date   ALT 8 12/25/2021   AST 16 12/25/2021   ALKPHOS 95 12/25/2021   BILITOT 0.3 12/25/2021   Lab Results  Component Value Date   CHOL 280 (H) 01/13/2021   HDL 63 01/13/2021   LDLCALC 195 (H) 01/13/2021   TRIG 108  01/13/2021   CHOLHDL 4.4 01/13/2021    Lab Results  Component Value Date   HGBA1C 7.2 (H) 01/13/2021    Assessment & Plan    1.  Chronic systolic CHF: -Most recent 2D echo completed 01/2021 with EF of 30-35% with moderately decreased LV function, and RWMA -GDMT was optimized during previous visit and today patient reports*** -Continue GDMT with Entresto 24/26 mg twice daily,Farxiga 10 mg, carvedilol 25 mg twice daily  2.  Coronary artery disease: -s/p LHC 2016 BMS x 2 to proximal mid RCA, and most recent Myoview showed small apical mixed defect suspicious for infarct with no evidence of reversibility to suggest inducible ischemia  -Today patient reports*** -Continue current GDMT with Lipitor 80 mg, ASA 81 mg  3.  Paroxysmal atrial fibrillation -Patient currently on rate control with amiodarone 200 mg daily and digoxin 0.0625 mg daily with most recent level completed 11/2021 with no therapeutic changes made -Today patient reports*** -Continue Eliquis 5 mg twice daily  4.  Essential hypertension: -Patient's blood pressure today was***  5.  Mixed hyperlipidemia: -Patient's last LDL cholesterol was*** -Continue atorvastatin 80 mg daily      Disposition: Follow-up with None or APP in *** months {Are you ordering a CV Procedure (e.g. stress test, cath, DCCV, TEE, etc)?   Press F2        :[id number]   Medication Adjustments/Labs and Tests Ordered: Current medicines are reviewed at length with the patient today.  Concerns regarding medicines are outlined above.   Signed, Mable Fill, Marissa Nestle, NP 02/07/2022, 2:44 PM [facility]  Note:  This document was prepared using Dragon voice recognition software and may include unintentional dictation errors.

## 2022-02-08 ENCOUNTER — Ambulatory Visit: Payer: Medicare Other | Attending: Nurse Practitioner | Admitting: Nurse Practitioner

## 2022-02-08 DIAGNOSIS — I5022 Chronic systolic (congestive) heart failure: Secondary | ICD-10-CM

## 2022-02-08 DIAGNOSIS — E119 Type 2 diabetes mellitus without complications: Secondary | ICD-10-CM

## 2022-02-08 DIAGNOSIS — I10 Essential (primary) hypertension: Secondary | ICD-10-CM

## 2022-02-08 DIAGNOSIS — I2510 Atherosclerotic heart disease of native coronary artery without angina pectoris: Secondary | ICD-10-CM

## 2022-02-08 DIAGNOSIS — I480 Paroxysmal atrial fibrillation: Secondary | ICD-10-CM

## 2022-05-03 ENCOUNTER — Telehealth: Payer: Self-pay

## 2022-05-03 NOTE — Telephone Encounter (Signed)
Received call from patient requesting confirmation of office visit with Dr Elberta Fortis.  Provided office visit information and [address] office main number for any further follow up.

## 2022-05-17 ENCOUNTER — Other Ambulatory Visit: Payer: Self-pay | Admitting: Cardiovascular Disease

## 2022-05-17 DIAGNOSIS — Z1231 Encounter for screening mammogram for malignant neoplasm of breast: Secondary | ICD-10-CM

## 2022-05-24 ENCOUNTER — Ambulatory Visit
Admission: RE | Admit: 2022-05-24 | Discharge: 2022-05-24 | Disposition: A | Discharge status: Home / Self Care | Payer: Medicare HMO | Source: Ambulatory Visit | Attending: Cardiovascular Disease | Admitting: Cardiovascular Disease

## 2022-05-24 DIAGNOSIS — Z1231 Encounter for screening mammogram for malignant neoplasm of breast: Secondary | ICD-10-CM

## 2022-06-28 ENCOUNTER — Encounter: Payer: Self-pay | Admitting: Cardiology

## 2022-06-28 ENCOUNTER — Ambulatory Visit: Payer: Medicare HMO | Attending: Cardiology | Admitting: Cardiology

## 2022-06-28 VITALS — BP 148/86 | HR 86 | Ht 66.0 in | Wt 205.6 lb

## 2022-06-28 DIAGNOSIS — I472 Ventricular tachycardia, unspecified: Secondary | ICD-10-CM | POA: Diagnosis not present

## 2022-06-28 DIAGNOSIS — Z79899 Other long term (current) drug therapy: Secondary | ICD-10-CM | POA: Diagnosis not present

## 2022-06-28 DIAGNOSIS — I5022 Chronic systolic (congestive) heart failure: Secondary | ICD-10-CM

## 2022-06-28 DIAGNOSIS — I480 Paroxysmal atrial fibrillation: Secondary | ICD-10-CM | POA: Diagnosis not present

## 2022-06-28 DIAGNOSIS — I10 Essential (primary) hypertension: Secondary | ICD-10-CM

## 2022-06-28 DIAGNOSIS — I2510 Atherosclerotic heart disease of native coronary artery without angina pectoris: Secondary | ICD-10-CM

## 2022-06-28 NOTE — Patient Instructions (Signed)
Medication Instructions:  Your physician recommends that you continue on your current medications as directed. Please refer to the Current Medication list given to you today.  *If you need a refill on your cardiac medications before your next appointment, please call your pharmacy*   Lab Work: Amiodarone & Eliquis surveillance labs today: CMET, CBC & TSH If you have labs (blood work) drawn today and your tests are completely normal, you will receive your results only by: MyChart Message (if you have MyChart) OR A paper copy in the mail If you have any lab test that is abnormal or we need to change your treatment, we will call you to review the results.   Testing/Procedures: None ordered   Follow-Up: At [facility], you and your health needs are our priority.  As part of our continuing mission to provide you with exceptional heart care, we have created designated Provider Care Teams.  These Care Teams include your primary Cardiologist (physician) and Advanced Practice Providers (APPs -  Physician Assistants and Nurse Practitioners) who all work together to provide you with the care you need, when you need it.  Remote monitoring is used to monitor your Pacemaker or ICD from home. This monitoring reduces the number of office visits required to check your device to one time per year. It allows [place] to keep an eye on the functioning of your device to ensure it is working properly. You are scheduled for a device check from home on 09/27/22. You may send your transmission at any time that day. If you have a wireless device, the transmission will be sent automatically. After your physician reviews your transmission, you will receive a postcard with your next transmission date.  Your next appointment:   1 year(s)  The format for your next appointment:   In Person  Provider:   You will see one of the following Advanced Practice Providers on your designated Care Team:   Francis Dowse, [place]  "Mardelle Matte" [place], [place] Canary Brim, NP   Thank you for choosing [facility]!!   Dory Horn, RN [phone]    Other Instructions

## 2022-06-28 NOTE — Progress Notes (Signed)
Electrophysiology Office Note:   Date:  06/28/2022  ID:  Shelley Douglas, DOB September 27, 1950, MRN [id number]  Primary Cardiologist: None Electrophysiologist: Algie Cales Jorja Loa, MD      History of Present Illness:   Shelley Douglas is a 72 y.o. female with h/o chronic systolic heart failure, ICD seen today for routine electrophysiology followup.  Since last being seen in our clinic the patient reports doing she has had no chest pain or shortness of breath.  She is able to do all her daily activities without restriction..  she denies chest pain, palpitations, dyspnea, PND, orthopnea, nausea, vomiting, dizziness, syncope, edema, weight gain, or early satiety.     She has a history seen for coronary artery disease, mitral valve annuloplasty in 1996, type 2 diabetes, CVA, chronic systolic heart failure, atrial fibrillation, PE/DVT, left bundle branch block.  She is post Medtronic CRT-D with an upgrade 07/01/2015.  She has to better mental status to RCA.  Myoview in 2018 showed inferior infarction.     Review of systems complete and found to be negative unless listed in HPI.   Device History: Medtronic BiV ICD implanted 07/01/2015 for chronic systolic heart failure History of appropriate therapy: No History of AAD therapy: No    Studies Reviewed:    ICD Interrogation-  reviewed in detail today,  See PACEART report.  EKG is ordered today. Personal review as below.  EKG Interpretation  Date/Time:  Monday June 28 2022 09:21:48 EDT Ventricular Rate:  86 PR Interval:    QRS Duration: 116 QT Interval:  404 QTC Calculation: 483 R Axis:   -88 Text Interpretation: Ventricular-paced rhythm with occasional Premature ventricular complexes Biventricular pacemaker detected When compared with ECG of 09-Jan-2021 10:29, No significant change since last tracing Confirmed by Shelley Douglas ([id number]) on 06/28/2022 9:32:39 AM    Risk Assessment/Calculations:    CHA2DS2-VASc Score = 8   This indicates  a 10.8% annual risk of stroke. The patient's score is based upon: CHF History: 1 HTN History: 1 Diabetes History: 1 Stroke History: 2 Vascular Disease History: 1 Age Score: 1 Gender Score: 1    HYPERTENSION CONTROL Vitals:   06/28/22 0912 06/28/22 0951  BP: (!) 144/86 (!) 148/86    The patient's blood pressure is elevated above target today.  In order to address the patient's elevated BP: Blood pressure Shelley Douglas be monitored at home to determine if medication changes need to be made.; The blood pressure is usually elevated in clinic.  Blood pressures monitored at home have been optimal.; Follow up with general cardiology has been recommended.           Physical Exam:   VS:  BP (!) 148/86   Pulse 86   Ht 5\' 6"  (1.676 m)   Wt 205 lb 9.6 oz (93.3 kg)   SpO2 97%   BMI 33.18 kg/m    Wt Readings from Last 3 Encounters:  06/28/22 205 lb 9.6 oz (93.3 kg)  12/25/21 211 lb (95.7 kg)  11/04/21 216 lb (98 kg)     GEN: Well nourished, well developed in no acute distress NECK: No JVD; No carotid bruits CARDIAC: Regular rate and rhythm, no murmurs, rubs, gallops RESPIRATORY:  Clear to auscultation without rales, wheezing or rhonchi  ABDOMEN: Soft, non-tender, non-distended EXTREMITIES:  No edema; No deformity   ASSESSMENT AND PLAN:    1.  Chronic systolic dysfunction s/p Medtronic CRT-D  euvolemic today Stable on an appropriate medical regimen Normal ICD function See Pace Art report No  changes today Currently on optimal medical therapy per primary cardiology.  2.  Paroxysmal atrial fibrillation: CHA2DS2-VASc of 6.  Currently on Eliquis and amiodarone.  Received ICD shock for likely atrial fibrillation versus SVT.  3.  Coronary artery disease: Post RCA PCI.  No current chest pain.  Continue aspirin per primary cardiology  4.  Hypertension: Mildly elevated today.  Usually well-controlled.  No changes.  5.  Secondary hypercoagulable state: Currently on Eliquis for atrial  fibrillation  6.  High risk medication monitoring: Currently on amiodarone.  Check TSH and LFTs today.  7.  Mitral valve annuloplasty: Monitoring per primary cardiology  8.  Hyperlipidemia: Lipitor 80 mg  9.  Nonsustained VT/PVCs: Currently on amiodarone.  She did have a run of nonsustained VT.  She does not have a zone greater than 200 bpm.  She did convert to sinus rhythm.  Shelley Douglas add another zone at 200 bpm with multiple rounds of ATP prior to shocks.  Disposition:   Follow up with EP APP in 12 months   Signed, Shelley Douglas Jorja Loa, MD

## 2022-06-29 LAB — COMPREHENSIVE METABOLIC PANEL
ALT: 7 IU/L (ref 0–32)
AST: 14 IU/L (ref 0–40)
Albumin: 4.1 g/dL (ref 3.8–4.8)
Alkaline Phosphatase: 89 IU/L (ref 44–121)
BUN/Creatinine Ratio: 12 (ref 12–28)
BUN: 14 mg/dL (ref 8–27)
Bilirubin Total: <0.2 mg/dL (ref 0.0–1.2)
CO2: 20 mmol/L (ref 20–29)
Calcium: 10.8 mg/dL — ABNORMAL HIGH (ref 8.7–10.3)
Chloride: 107 mmol/L — ABNORMAL HIGH (ref 96–106)
Creatinine, Ser: 1.19 mg/dL — ABNORMAL HIGH (ref 0.57–1.00)
Globulin, Total: 3.6 g/dL (ref 1.5–4.5)
Glucose: 133 mg/dL — ABNORMAL HIGH (ref 70–99)
Potassium: 4.7 mmol/L (ref 3.5–5.2)
Sodium: 141 mmol/L (ref 134–144)
Total Protein: 7.7 g/dL (ref 6.0–8.5)
eGFR: 49 mL/min/{1.73_m2} — ABNORMAL LOW (ref >59)

## 2022-06-29 LAB — CBC
Hematocrit: 39 % (ref 34.0–46.6)
Hemoglobin: 12.7 g/dL (ref 11.1–15.9)
MCH: 28.3 pg (ref 26.6–33.0)
MCHC: 32.6 g/dL (ref 31.5–35.7)
MCV: 87 fL (ref 79–97)
Platelets: 178 10*3/uL (ref 150–450)
RBC: 4.49 x10E6/uL (ref 3.77–5.28)
RDW: 13.6 % (ref 11.7–15.4)
WBC: 5.1 10*3/uL (ref 3.4–10.8)

## 2022-06-29 LAB — TSH: TSH: 0.77 u[IU]/mL (ref 0.450–4.500)

## 2022-07-01 ENCOUNTER — Telehealth: Payer: Self-pay

## 2022-07-01 NOTE — Telephone Encounter (Signed)
-----   Message from Baird Lyons, RN sent at 06/28/2022 10:13 AM EDT ----- Regarding: re-establish remotes MDT gave pt brochure to help get her back on remotes. (Pt has box at home)  Per [place]:  please follow up w/ pt in couple days to help troubleshoot, if needed, getting remotes re-established  (I placed remote transmission for 9/23)  Thx

## 2022-07-01 NOTE — Telephone Encounter (Signed)
Transmission received 06/28/2022.

## 2022-07-27 ENCOUNTER — Telehealth: Payer: Self-pay | Admitting: Cardiology

## 2022-07-27 MED ORDER — APIXABAN 5 MG PO TABS: 5.0000 mg | ORAL_TABLET | Freq: Two times a day (BID) | ORAL | 0 refills | Status: DC

## 2022-07-27 NOTE — Telephone Encounter (Signed)
Ok to sample for 1-2 weeks, dose is correct.

## 2022-07-27 NOTE — Telephone Encounter (Signed)
Called pt and left message informing her that we were leaving 2 boxes of Eliquis 5 mg tablets for pt to pick up, a 2 week supply,  at [facility] front desk.

## 2022-07-27 NOTE — Telephone Encounter (Signed)
Patient calling the office for samples of medication:   1.  What medication and dosage are you requesting samples for? Eliquis  2.  Are you currently out of this medication? Lost her  bottle of Eliquis

## 2022-08-02 ENCOUNTER — Ambulatory Visit: Payer: Medicare HMO

## 2022-08-02 DIAGNOSIS — I472 Ventricular tachycardia, unspecified: Secondary | ICD-10-CM

## 2022-08-02 LAB — CUP PACEART REMOTE DEVICE CHECK
Battery Remaining Longevity: 5 mo
Battery Voltage: 2.79 V
Brady Statistic AP VP Percent: 1.88 %
Brady Statistic AP VS Percent: 0.06 %
Brady Statistic AS VP Percent: 86.35 %
Brady Statistic AS VS Percent: 11.72 %
Brady Statistic RA Percent Paced: 1.9 %
Brady Statistic RV Percent Paced: 82.61 %
Date Time Interrogation Session: 20240729044223
HighPow Impedance: 40 Ohm
HighPow Impedance: 46 Ohm
Implantable Lead Connection Status: 753985
Implantable Lead Connection Status: 753985
Implantable Lead Connection Status: 753985
Implantable Lead Implant Date: 20090720
Implantable Lead Implant Date: 20090720
Implantable Lead Implant Date: 20170627
Implantable Lead Location: 753858
Implantable Lead Location: 753859
Implantable Lead Location: 753860
Implantable Lead Model: 4598
Implantable Lead Model: 5076
Implantable Lead Model: 6947
Implantable Pulse Generator Implant Date: 20170627
Lead Channel Impedance Value: 1083 Ohm
Lead Channel Impedance Value: 1083 Ohm
Lead Channel Impedance Value: 266 Ohm
Lead Channel Impedance Value: 304 Ohm
Lead Channel Impedance Value: 342 Ohm
Lead Channel Impedance Value: 456 Ohm
Lead Channel Impedance Value: 456 Ohm
Lead Channel Impedance Value: 456 Ohm
Lead Channel Impedance Value: 665 Ohm
Lead Channel Impedance Value: 665 Ohm
Lead Channel Impedance Value: 703 Ohm
Lead Channel Impedance Value: 703 Ohm
Lead Channel Impedance Value: 893 Ohm
Lead Channel Pacing Threshold Amplitude: 0.5 V
Lead Channel Pacing Threshold Amplitude: 0.625 V
Lead Channel Pacing Threshold Amplitude: 1.25 V
Lead Channel Pacing Threshold Pulse Width: 0.4 ms
Lead Channel Pacing Threshold Pulse Width: 0.4 ms
Lead Channel Pacing Threshold Pulse Width: 0.6 ms
Lead Channel Sensing Intrinsic Amplitude: 4.625 mV
Lead Channel Sensing Intrinsic Amplitude: 4.625 mV
Lead Channel Sensing Intrinsic Amplitude: 7.375 mV
Lead Channel Sensing Intrinsic Amplitude: 7.375 mV
Lead Channel Setting Pacing Amplitude: 1.5 V
Lead Channel Setting Pacing Amplitude: 2 V
Lead Channel Setting Pacing Amplitude: 2 V
Lead Channel Setting Pacing Pulse Width: 0.4 ms
Lead Channel Setting Pacing Pulse Width: 0.6 ms
Lead Channel Setting Sensing Sensitivity: 0.3 mV
Zone Setting Status: 755011
Zone Setting Status: 755011

## 2022-08-13 ENCOUNTER — Telehealth: Payer: Self-pay | Admitting: Student

## 2022-08-13 NOTE — Telephone Encounter (Signed)
Left message to call back  

## 2022-08-13 NOTE — Telephone Encounter (Signed)
Patient is returning call. Please advise? 

## 2022-08-13 NOTE — Telephone Encounter (Signed)
Returned pt call. LVM to return our call.

## 2022-08-13 NOTE — Telephone Encounter (Signed)
   Patient called our after-hours line shortly before 8am with reports of shortness of breath. I tried to call patient but was unable to reach her. Left a message to call back. Will also route this message to the triage pool now that our office is open to try to recall her later this morning.  Corrin Parker, PA-C 08/13/2022 8:04 AM

## 2022-08-16 NOTE — Telephone Encounter (Signed)
3rd attempt to call patient.  LM if she still has issues, to please call office or call to set an appointment

## 2022-08-18 NOTE — Progress Notes (Signed)
Remote ICD transmission.   

## 2022-09-02 ENCOUNTER — Ambulatory Visit (INDEPENDENT_AMBULATORY_CARE_PROVIDER_SITE_OTHER): Payer: Medicare HMO

## 2022-09-02 DIAGNOSIS — I472 Ventricular tachycardia, unspecified: Secondary | ICD-10-CM | POA: Diagnosis not present

## 2022-09-02 LAB — CUP PACEART REMOTE DEVICE CHECK
Battery Remaining Longevity: 4 mo
Battery Voltage: 2.79 V
Brady Statistic AP VP Percent: 1.57 %
Brady Statistic AP VS Percent: 0.04 %
Brady Statistic AS VP Percent: 87.46 %
Brady Statistic AS VS Percent: 10.93 %
Brady Statistic RA Percent Paced: 1.59 %
Brady Statistic RV Percent Paced: 84.48 %
Date Time Interrogation Session: 20240829063327
HighPow Impedance: 39 Ohm
HighPow Impedance: 45 Ohm
Implantable Lead Connection Status: 753985
Implantable Lead Connection Status: 753985
Implantable Lead Connection Status: 753985
Implantable Lead Implant Date: 20090720
Implantable Lead Implant Date: 20090720
Implantable Lead Implant Date: 20170627
Implantable Lead Location: 753858
Implantable Lead Location: 753859
Implantable Lead Location: 753860
Implantable Lead Model: 4598
Implantable Lead Model: 5076
Implantable Lead Model: 6947
Implantable Pulse Generator Implant Date: 20170627
Lead Channel Impedance Value: 266 Ohm
Lead Channel Impedance Value: 304 Ohm
Lead Channel Impedance Value: 380 Ohm
Lead Channel Impedance Value: 380 Ohm
Lead Channel Impedance Value: 437 Ohm
Lead Channel Impedance Value: 437 Ohm
Lead Channel Impedance Value: 589 Ohm
Lead Channel Impedance Value: 646 Ohm
Lead Channel Impedance Value: 646 Ohm
Lead Channel Impedance Value: 646 Ohm
Lead Channel Impedance Value: 855 Ohm
Lead Channel Impedance Value: 969 Ohm
Lead Channel Impedance Value: 988 Ohm
Lead Channel Pacing Threshold Amplitude: 0.5 V
Lead Channel Pacing Threshold Amplitude: 0.75 V
Lead Channel Pacing Threshold Amplitude: 1.25 V
Lead Channel Pacing Threshold Pulse Width: 0.4 ms
Lead Channel Pacing Threshold Pulse Width: 0.4 ms
Lead Channel Pacing Threshold Pulse Width: 0.6 ms
Lead Channel Sensing Intrinsic Amplitude: 4.25 mV
Lead Channel Sensing Intrinsic Amplitude: 4.25 mV
Lead Channel Sensing Intrinsic Amplitude: 6.25 mV
Lead Channel Sensing Intrinsic Amplitude: 6.25 mV
Lead Channel Setting Pacing Amplitude: 1.5 V
Lead Channel Setting Pacing Amplitude: 2 V
Lead Channel Setting Pacing Amplitude: 2 V
Lead Channel Setting Pacing Pulse Width: 0.4 ms
Lead Channel Setting Pacing Pulse Width: 0.6 ms
Lead Channel Setting Sensing Sensitivity: 0.3 mV
Zone Setting Status: 755011
Zone Setting Status: 755011

## 2022-09-09 NOTE — Progress Notes (Signed)
Remote ICD transmission.   

## 2022-10-04 ENCOUNTER — Ambulatory Visit (INDEPENDENT_AMBULATORY_CARE_PROVIDER_SITE_OTHER): Payer: Medicare Other

## 2022-10-04 DIAGNOSIS — I472 Ventricular tachycardia, unspecified: Secondary | ICD-10-CM | POA: Diagnosis not present

## 2022-10-04 LAB — CUP PACEART REMOTE DEVICE CHECK
Battery Remaining Longevity: 3 mo
Battery Voltage: 2.77 V
Brady Statistic AP VP Percent: 1.08 %
Brady Statistic AP VS Percent: 0.03 %
Brady Statistic AS VP Percent: 89.3 %
Brady Statistic AS VS Percent: 9.58 %
Brady Statistic RA Percent Paced: 1.07 %
Brady Statistic RV Percent Paced: 87.71 %
Date Time Interrogation Session: 20240930001804
HighPow Impedance: 40 Ohm
HighPow Impedance: 51 Ohm
Implantable Lead Connection Status: 753985
Implantable Lead Connection Status: 753985
Implantable Lead Connection Status: 753985
Implantable Lead Implant Date: 20090720
Implantable Lead Implant Date: 20090720
Implantable Lead Implant Date: 20170627
Implantable Lead Location: 753858
Implantable Lead Location: 753859
Implantable Lead Location: 753860
Implantable Lead Model: 4598
Implantable Lead Model: 5076
Implantable Lead Model: 6947
Implantable Pulse Generator Implant Date: 20170627
Lead Channel Impedance Value: 304 Ohm
Lead Channel Impedance Value: 323 Ohm
Lead Channel Impedance Value: 342 Ohm
Lead Channel Impedance Value: 437 Ohm
Lead Channel Impedance Value: 437 Ohm
Lead Channel Impedance Value: 437 Ohm
Lead Channel Impedance Value: 570 Ohm
Lead Channel Impedance Value: 589 Ohm
Lead Channel Impedance Value: 665 Ohm
Lead Channel Impedance Value: 665 Ohm
Lead Channel Impedance Value: 855 Ohm
Lead Channel Impedance Value: 912 Ohm
Lead Channel Impedance Value: 950 Ohm
Lead Channel Pacing Threshold Amplitude: 0.5 V
Lead Channel Pacing Threshold Amplitude: 0.625 V
Lead Channel Pacing Threshold Amplitude: 1.125 V
Lead Channel Pacing Threshold Pulse Width: 0.4 ms
Lead Channel Pacing Threshold Pulse Width: 0.4 ms
Lead Channel Pacing Threshold Pulse Width: 0.6 ms
Lead Channel Sensing Intrinsic Amplitude: 11.375 mV
Lead Channel Sensing Intrinsic Amplitude: 11.375 mV
Lead Channel Sensing Intrinsic Amplitude: 4.75 mV
Lead Channel Sensing Intrinsic Amplitude: 4.75 mV
Lead Channel Setting Pacing Amplitude: 1.5 V
Lead Channel Setting Pacing Amplitude: 2 V
Lead Channel Setting Pacing Amplitude: 2 V
Lead Channel Setting Pacing Pulse Width: 0.4 ms
Lead Channel Setting Pacing Pulse Width: 0.6 ms
Lead Channel Setting Sensing Sensitivity: 0.3 mV
Zone Setting Status: 755011
Zone Setting Status: 755011

## 2022-10-18 NOTE — Progress Notes (Signed)
Remote ICD transmission.   

## 2022-11-04 ENCOUNTER — Ambulatory Visit (INDEPENDENT_AMBULATORY_CARE_PROVIDER_SITE_OTHER): Payer: Medicare Other

## 2022-11-04 DIAGNOSIS — I472 Ventricular tachycardia, unspecified: Secondary | ICD-10-CM | POA: Diagnosis not present

## 2022-11-04 LAB — CUP PACEART REMOTE DEVICE CHECK
Battery Remaining Longevity: 2 mo
Battery Voltage: 2.74 V
Brady Statistic AP VP Percent: 0.39 %
Brady Statistic AP VS Percent: 0.02 %
Brady Statistic AS VP Percent: 92.37 %
Brady Statistic AS VS Percent: 7.23 %
Brady Statistic RA Percent Paced: 0.41 %
Brady Statistic RV Percent Paced: 90.28 %
Date Time Interrogation Session: 20241031043823
HighPow Impedance: 42 Ohm
HighPow Impedance: 50 Ohm
Implantable Lead Connection Status: 753985
Implantable Lead Connection Status: 753985
Implantable Lead Connection Status: 753985
Implantable Lead Implant Date: 20090720
Implantable Lead Implant Date: 20090720
Implantable Lead Implant Date: 20170627
Implantable Lead Location: 753858
Implantable Lead Location: 753859
Implantable Lead Location: 753860
Implantable Lead Model: 4598
Implantable Lead Model: 5076
Implantable Lead Model: 6947
Implantable Pulse Generator Implant Date: 20170627
Lead Channel Impedance Value: 1045 Ohm
Lead Channel Impedance Value: 1045 Ohm
Lead Channel Impedance Value: 304 Ohm
Lead Channel Impedance Value: 342 Ohm
Lead Channel Impedance Value: 380 Ohm
Lead Channel Impedance Value: 456 Ohm
Lead Channel Impedance Value: 456 Ohm
Lead Channel Impedance Value: 494 Ohm
Lead Channel Impedance Value: 627 Ohm
Lead Channel Impedance Value: 665 Ohm
Lead Channel Impedance Value: 703 Ohm
Lead Channel Impedance Value: 722 Ohm
Lead Channel Impedance Value: 950 Ohm
Lead Channel Pacing Threshold Amplitude: 0.5 V
Lead Channel Pacing Threshold Amplitude: 0.75 V
Lead Channel Pacing Threshold Amplitude: 1.25 V
Lead Channel Pacing Threshold Pulse Width: 0.4 ms
Lead Channel Pacing Threshold Pulse Width: 0.4 ms
Lead Channel Pacing Threshold Pulse Width: 0.6 ms
Lead Channel Sensing Intrinsic Amplitude: 4.625 mV
Lead Channel Sensing Intrinsic Amplitude: 4.625 mV
Lead Channel Sensing Intrinsic Amplitude: 6.75 mV
Lead Channel Sensing Intrinsic Amplitude: 6.75 mV
Lead Channel Setting Pacing Amplitude: 1.5 V
Lead Channel Setting Pacing Amplitude: 2 V
Lead Channel Setting Pacing Amplitude: 2 V
Lead Channel Setting Pacing Pulse Width: 0.4 ms
Lead Channel Setting Pacing Pulse Width: 0.6 ms
Lead Channel Setting Sensing Sensitivity: 0.3 mV
Zone Setting Status: 755011
Zone Setting Status: 755011

## 2022-11-16 NOTE — Progress Notes (Signed)
CARDIOLOGY CONSULT NOTE       Patient ID: Shelley Douglas MRN: [id number] DOB/AGE: [birth year] 72 y.o.  Referring Physician: Elberta Fortis Primary Physician: Rometta Emery, MD Primary Cardiologist: Elberta Fortis Reason for Consultation: CAD/MVD    HPI:  72 y.o. referred for continuity non EP issues by Dr Elberta Fortis Patient previously followed by Dr Shari Prows. History of ischemic DCM with AICD. Last cath 2016 resulted in stenting of proximal/mid RCA Distal dx not Rx 100% PDA.  Myovue 11/2016 inferior infarct no ischemia. EF 01/2021 30-35% Has mild MR and mild/mod MS. Prior CVA with afib and DVT with PE  Chronic LBBB Has surgery in 1996 with annuloplasty of MV.   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 Husband use to work for  [employer] and needs a new hip but hesitant to get one  4% PAF burden on [employer] ERI for device  ROS All other systems reviewed and negative except as noted above  Past Medical History:  Diagnosis Date   AICD (automatic cardioverter/defibrillator) present    Anxiety    pt denies this hx on 05/12/2016   CAD (coronary artery disease)    last cath 2016   Daily headache    DVT (deep venous thrombosis) (HCC) 1996   High cholesterol    History of blood transfusion    "related to pregnancy" (05/12/2016)   Hypertension    "just started taking RX" (05/12/2016)   Ischemic cardiomyopathy    Left bundle branch block    Mitral valve regurgitation    Myocardial infarction (HCC) ~ 2009 X2   Obesity    Persistent atrial fibrillation (HCC)    Pulmonary embolism (HCC) 1993   Seizures (HCC) 1970s   "I had 1; don't know what it was from" (05/12/2016)   Stroke (HCC) 2001   denies residual on 05/12/2016   Type II diabetes mellitus (HCC)     Family History  Problem Relation Age of Onset   Hypertension Unknown     Social History   Socioeconomic History   Marital status: Married    Spouse name: Not on file   Number of children: Not on  file   Years of education: Not on file   Highest education level: Not on file  Occupational History   Occupation: disabled  Tobacco Use   Smoking status: Former    Current packs/day: 0.00    Types: Cigarettes    Start date: 01/05/1984    Quit date: 01/05/1988    Years since quitting: 34.9   Smokeless tobacco: Never   Tobacco comments:    05/12/2016 "someday smoker when I did smoked; never smoked much"  Vaping Use   Vaping status: Never Used  Substance and Sexual Activity   Alcohol use: No   Drug use: No   Sexual activity: Not Currently  Other Topics Concern   Not on file  Social History Narrative   Pt lives in [place] with spouse.   Disabled   Social Determinants of Health   Financial Resource Strain: Not on file  Food Insecurity: Not on file  Transportation Needs: Not on file  Physical Activity: Not on file  Stress: Not on file  Social Connections: Not on file  Intimate Partner Violence: Not on file    Past Surgical History:  Procedure Laterality Date   BILATERAL CARPAL TUNNEL RELEASE Bilateral 1990s   CARDIAC CATHETERIZATION N/A 09/27/2014   Procedure: Left Heart Cath and Coronary Angiography;  Surgeon:  Orpah Cobb, MD;  Location: [facility];  Service: Cardiovascular;  Laterality: N/A;   CARDIAC DEFIBRILLATOR PLACEMENT  07/24/2007   Medtronic Secura DR implanted in [place] for primary prevention   COLONOSCOPY WITH PROPOFOL N/A 04/17/2021   Procedure: COLONOSCOPY WITH PROPOFOL;  Surgeon: Jeani Hawking, MD;  Location: [facility];  Service: Endoscopy;  Laterality: N/A;   CORONARY ANGIOPLASTY WITH STENT PLACEMENT  ~ 2009   CORONARY ARTERY BYPASS GRAFT  ~ 2009   DILATION AND CURETTAGE OF UTERUS     EP IMPLANTABLE DEVICE N/A 07/01/2015   BiV ICD upgrade to Medtronic Viva Quad XT CRT-D device for primary prevention   EXTERNAL EAR SURGERY Right 1990   "skin grafted from my right thigh and put on my right ear"   HERNIA REPAIR  1990's    "stomach"   POLYPECTOMY   04/17/2021   Procedure: POLYPECTOMY;  Surgeon: Jeani Hawking, MD;  Location: [facility];  Service: Endoscopy;;   TUBAL LIGATION     VENA CAVA FILTER PLACEMENT  1993      Current Outpatient Medications:    amiodarone (PACERONE) 200 MG tablet, Take 1 tablet (200 mg total) by mouth daily., Disp: 30 tablet, Rfl: 3   apixaban (ELIQUIS) 5 MG TABS tablet, Take 1 tablet (5 mg total) by mouth 2 (two) times daily., Disp: 28 tablet, Rfl: 0   aspirin EC 81 MG tablet, Take 1 tablet (81 mg total) by mouth daily. Swallow whole., Disp: 30 tablet, Rfl: 1   atorvastatin (LIPITOR) 80 MG tablet, Take 1 tablet (80 mg total) by mouth daily., Disp: 30 tablet, Rfl: 1   carvedilol (COREG) 25 MG tablet, Take 25 mg by mouth 2 (two) times daily., Disp: , Rfl:    dapagliflozin propanediol (FARXIGA) 10 MG TABS tablet, Take 1 tablet (10 mg total) by mouth daily before breakfast., Disp: 30 tablet, Rfl: 4   digoxin (LANOXIN) 0.125 MG tablet, Take 0.0625 mg by mouth daily., Disp: , Rfl:    folic acid (FOLVITE) 1 MG tablet, Take 1 mg by mouth daily. , Disp: , Rfl:    furosemide (LASIX) 40 MG tablet, Take 1 tablet (40 mg total) by mouth daily for 5 days only, then decrease to taking 1 tablet (40 mg total) by mouth every Mon, Wed, and Friday thereafter. You may take an additional dose as needed on those days for increased swelling/weight gain., Disp: 40 tablet, Rfl: 0   lidocaine (LIDODERM) 5 %, Place 1 patch onto the skin daily. Remove & Discard patch within 12 hours or as directed by MD, Disp: 5 patch, Rfl: 0   lidocaine (XYLOCAINE) 2 % solution, Use as directed 10 mLs in the mouth or throat every 4 (four) hours as needed for mouth pain., Disp: 100 mL, Rfl: 0   metFORMIN (GLUCOPHAGE) 500 MG tablet, Take 500 mg by mouth 2 (two) times daily with a meal., Disp: , Rfl:    methocarbamol (ROBAXIN) 500 MG tablet, Take 1 tablet (500 mg total) by mouth every 8 (eight) hours as needed for muscle spasms., Disp: 30 tablet, Rfl: 0    sacubitril-valsartan (ENTRESTO) 24-26 MG, Take 1 tablet by mouth 2 (two) times daily., Disp: 60 tablet, Rfl: 3    Physical Exam: Height 5\' 6"  (1.676 m), weight 205 lb 6.4 oz (93.2 kg).    Affect appropriate Chronically ill  HEENT: normal Neck supple with no adenopathy JVP normal no bruits no thyromegaly Lungs clear with no wheezing and good diaphragmatic motion Heart:  S1/S2  no murmur, no rub, gallop or click PMI normal prior sternotomy CRT-D under left clavicle Abdomen: benighn, BS positve, no tenderness, no AAA no bruit.  No HSM or HJR Distal pulses intact with no bruits No edema Neuro non-focal Skin warm and dry No muscular weakness   Labs:   Lab Results  Component Value Date   WBC 5.1 06/28/2022   HGB 12.7 06/28/2022   HCT 39.0 06/28/2022   MCV 87 06/28/2022   PLT 178 06/28/2022   No results for input(s): "NA", "K", "CL", "CO2", "BUN", "CREATININE", "CALCIUM", "PROT", "BILITOT", "ALKPHOS", "ALT", "AST", "GLUCOSE" in the last 168 hours.  Invalid input(s): "LABALBU" Lab Results  Component Value Date   TROPONINI <0.03 11/08/2016    Lab Results  Component Value Date   CHOL 280 (H) 01/13/2021   CHOL 241 (H) 05/14/2016   CHOL 216 (H) 12/07/2014   Lab Results  Component Value Date   HDL 63 01/13/2021   HDL 52 05/14/2016   HDL 55 12/07/2014   Lab Results  Component Value Date   LDLCALC 195 (H) 01/13/2021   LDLCALC 164 (H) 05/14/2016   LDLCALC 142 (H) 12/07/2014   Lab Results  Component Value Date   TRIG 108 01/13/2021   TRIG 124 05/14/2016   TRIG 97 12/07/2014   Lab Results  Component Value Date   CHOLHDL 4.4 01/13/2021   CHOLHDL 4.6 05/14/2016   CHOLHDL 3.9 12/07/2014   No results found for: "LDLDIRECT"    Radiology: CUP PACEART REMOTE DEVICE CHECK  Result Date: 11/04/2022 Monthly battery check.  Estimated 93mo Normal device function. No new alerts.  HF diagnostics currently abnormal Follow up as scheduled monthly LA, CVRS   EKG: V pacing  PVCls 06/28/22    ASSESSMENT AND PLAN:   1.  Heart failure: Due to ischemic and nonischemic cardiomyopathy.  Ejection fraction 30 to 35%.  Status post Medtronic CRT-D.  Device function appropriately.  Currently on optimal medical therapy per general cardiology.   2.  Coronary artery disease: Status post RCA PCI.  No current chest pain.  Continue aspirin per primary cardiology   3.  Hypertension: Currently well-controlled   4.  Paroxysmal atrial fibrillation: CHA2DS2-VASc of 6.  Currently on Eliquis 5 mg twice daily, amiodarone 200 mg daily.  Did receive an ICD shock for what appears to be either rapid atrial fibrillation or SVT.  Has since been started on Farxiga.  No changes. On amiodarone TSH/LFTls normal 06/28/22    5.  Secondary hypercoagulable state: Currently on Eliquis for atrial fibrillation as above   6.  High risk medication monitoring: Currently on amiodarone as above.  Will check TSH and LFTs today.   7.  History of mitral valve annuloplasty: Continue monitoring per primary cardiology   8.  Hyperlipidemia: Continue Lipitor 80 mg  F/U in a year  Signed: Charlton Haws 11/29/2022, 8:11 AM

## 2022-11-17 NOTE — Progress Notes (Signed)
Remote ICD transmission.   

## 2022-11-29 ENCOUNTER — Encounter: Payer: Self-pay | Admitting: Cardiovascular Disease

## 2022-11-29 ENCOUNTER — Ambulatory Visit: Payer: Medicare Other | Attending: Cardiovascular Disease | Admitting: Cardiovascular Disease

## 2022-11-29 VITALS — BP 142/78 | HR 88 | Resp 16 | Ht 66.0 in | Wt 205.4 lb

## 2022-11-29 DIAGNOSIS — I472 Ventricular tachycardia, unspecified: Secondary | ICD-10-CM

## 2022-11-29 DIAGNOSIS — I10 Essential (primary) hypertension: Secondary | ICD-10-CM

## 2022-11-29 DIAGNOSIS — I5022 Chronic systolic (congestive) heart failure: Secondary | ICD-10-CM

## 2022-11-29 DIAGNOSIS — I480 Paroxysmal atrial fibrillation: Secondary | ICD-10-CM

## 2022-11-29 NOTE — Patient Instructions (Signed)
Medication Instructions:  Your physician recommends that you continue on your current medications as directed. Please refer to the Current Medication list given to you today.  *If you need a refill on your cardiac medications before your next appointment, please call your pharmacy*   Lab Work: NONE If you have labs (blood work) drawn today and your tests are completely normal, you will receive your results only by: MyChart Message (if you have MyChart) OR A paper copy in the mail If you have any lab test that is abnormal or we need to change your treatment, we will call you to review the results.   Testing/Procedures: NONE   Follow-Up:Please see Dr. Elberta Fortis (EP) in 6 months At [facility], you and your health needs are our priority.  As part of our continuing mission to provide you with exceptional heart care, we have created designated Provider Care Teams.  These Care Teams include your primary Cardiologist (physician) and Advanced Practice Providers (APPs -  Physician Assistants and Nurse Practitioners) who all work together to provide you with the care you need, when you need it.   Your next appointment:   1 yr   Provider:   Charlton Haws, MD

## 2022-12-06 ENCOUNTER — Ambulatory Visit (INDEPENDENT_AMBULATORY_CARE_PROVIDER_SITE_OTHER): Payer: Medicare HMO

## 2022-12-06 ENCOUNTER — Telehealth: Payer: Self-pay

## 2022-12-06 DIAGNOSIS — I472 Ventricular tachycardia, unspecified: Secondary | ICD-10-CM

## 2022-12-06 NOTE — Telephone Encounter (Signed)
Alert received from [employer] for RRT (03-Dec-2022): REPLACE DEVICE. Less than 3 months to EOS. Routed to Triage for RRT.  LMTCB.

## 2022-12-07 LAB — CUP PACEART REMOTE DEVICE CHECK
Battery Remaining Longevity: 1 mo
Battery Voltage: 2.71 V
Brady Statistic AP VP Percent: 0.47 %
Brady Statistic AP VS Percent: 0.01 %
Brady Statistic AS VP Percent: 89.98 %
Brady Statistic AS VS Percent: 9.54 %
Brady Statistic RA Percent Paced: 0.49 %
Brady Statistic RV Percent Paced: 86.5 %
Date Time Interrogation Session: 20241202202828
HighPow Impedance: 43 Ohm
HighPow Impedance: 56 Ohm
Implantable Lead Connection Status: 753985
Implantable Lead Connection Status: 753985
Implantable Lead Connection Status: 753985
Implantable Lead Implant Date: 20090720
Implantable Lead Implant Date: 20090720
Implantable Lead Implant Date: 20170627
Implantable Lead Location: 753858
Implantable Lead Location: 753859
Implantable Lead Location: 753860
Implantable Lead Model: 4598
Implantable Lead Model: 5076
Implantable Lead Model: 6947
Implantable Pulse Generator Implant Date: 20170627
Lead Channel Impedance Value: 1102 Ohm
Lead Channel Impedance Value: 1102 Ohm
Lead Channel Impedance Value: 323 Ohm
Lead Channel Impedance Value: 323 Ohm
Lead Channel Impedance Value: 380 Ohm
Lead Channel Impedance Value: 456 Ohm
Lead Channel Impedance Value: 456 Ohm
Lead Channel Impedance Value: 494 Ohm
Lead Channel Impedance Value: 627 Ohm
Lead Channel Impedance Value: 665 Ohm
Lead Channel Impedance Value: 722 Ohm
Lead Channel Impedance Value: 722 Ohm
Lead Channel Impedance Value: 969 Ohm
Lead Channel Pacing Threshold Amplitude: 0.5 V
Lead Channel Pacing Threshold Amplitude: 0.625 V
Lead Channel Pacing Threshold Amplitude: 1.375 V
Lead Channel Pacing Threshold Pulse Width: 0.4 ms
Lead Channel Pacing Threshold Pulse Width: 0.4 ms
Lead Channel Pacing Threshold Pulse Width: 0.6 ms
Lead Channel Sensing Intrinsic Amplitude: 4 mV
Lead Channel Sensing Intrinsic Amplitude: 4 mV
Lead Channel Sensing Intrinsic Amplitude: 6.5 mV
Lead Channel Sensing Intrinsic Amplitude: 6.5 mV
Lead Channel Setting Pacing Amplitude: 1.5 V
Lead Channel Setting Pacing Amplitude: 2 V
Lead Channel Setting Pacing Amplitude: 2 V
Lead Channel Setting Pacing Pulse Width: 0.4 ms
Lead Channel Setting Pacing Pulse Width: 0.6 ms
Lead Channel Setting Sensing Sensitivity: 0.3 mV
Zone Setting Status: 755011
Zone Setting Status: 755011

## 2022-12-08 NOTE — Telephone Encounter (Signed)
Attempted to contact patient. No answer,LMTCB 

## 2022-12-09 NOTE — Telephone Encounter (Signed)
Mychart message sent.

## 2022-12-10 NOTE — Telephone Encounter (Signed)
LM

## 2022-12-13 ENCOUNTER — Encounter (HOSPITAL_COMMUNITY): Payer: Self-pay

## 2022-12-13 ENCOUNTER — Emergency Department (HOSPITAL_COMMUNITY): Payer: Medicare Other

## 2022-12-13 ENCOUNTER — Other Ambulatory Visit: Payer: Self-pay

## 2022-12-13 ENCOUNTER — Inpatient Hospital Stay (HOSPITAL_COMMUNITY)
Admission: EM | Admit: 2022-12-13 | Discharge: 2022-12-20 | DRG: 308 | Disposition: A | Discharge status: Home Health | Payer: Medicare Other | Attending: Internal Medicine | Admitting: Internal Medicine

## 2022-12-13 DIAGNOSIS — I2781 Cor pulmonale (chronic): Secondary | ICD-10-CM | POA: Diagnosis present

## 2022-12-13 DIAGNOSIS — I10 Essential (primary) hypertension: Secondary | ICD-10-CM | POA: Diagnosis present

## 2022-12-13 DIAGNOSIS — Y92009 Unspecified place in unspecified non-institutional (private) residence as the place of occurrence of the external cause: Secondary | ICD-10-CM | POA: Diagnosis not present

## 2022-12-13 DIAGNOSIS — E1122 Type 2 diabetes mellitus with diabetic chronic kidney disease: Secondary | ICD-10-CM | POA: Diagnosis present

## 2022-12-13 DIAGNOSIS — I361 Nonrheumatic tricuspid (valve) insufficiency: Secondary | ICD-10-CM | POA: Diagnosis not present

## 2022-12-13 DIAGNOSIS — E1169 Type 2 diabetes mellitus with other specified complication: Secondary | ICD-10-CM

## 2022-12-13 DIAGNOSIS — I342 Nonrheumatic mitral (valve) stenosis: Secondary | ICD-10-CM | POA: Diagnosis not present

## 2022-12-13 DIAGNOSIS — R002 Palpitations: Secondary | ICD-10-CM | POA: Diagnosis present

## 2022-12-13 DIAGNOSIS — I4892 Unspecified atrial flutter: Secondary | ICD-10-CM | POA: Diagnosis not present

## 2022-12-13 DIAGNOSIS — F419 Anxiety disorder, unspecified: Secondary | ICD-10-CM | POA: Diagnosis present

## 2022-12-13 DIAGNOSIS — E785 Hyperlipidemia, unspecified: Secondary | ICD-10-CM | POA: Diagnosis not present

## 2022-12-13 DIAGNOSIS — E876 Hypokalemia: Secondary | ICD-10-CM | POA: Diagnosis present

## 2022-12-13 DIAGNOSIS — I4819 Other persistent atrial fibrillation: Secondary | ICD-10-CM | POA: Diagnosis present

## 2022-12-13 DIAGNOSIS — N179 Acute kidney failure, unspecified: Secondary | ICD-10-CM | POA: Diagnosis present

## 2022-12-13 DIAGNOSIS — I2510 Atherosclerotic heart disease of native coronary artery without angina pectoris: Secondary | ICD-10-CM | POA: Diagnosis not present

## 2022-12-13 DIAGNOSIS — I5023 Acute on chronic systolic (congestive) heart failure: Secondary | ICD-10-CM | POA: Diagnosis present

## 2022-12-13 DIAGNOSIS — N1831 Chronic kidney disease, stage 3a: Secondary | ICD-10-CM | POA: Diagnosis present

## 2022-12-13 DIAGNOSIS — M7989 Other specified soft tissue disorders: Secondary | ICD-10-CM | POA: Diagnosis not present

## 2022-12-13 DIAGNOSIS — Z6833 Body mass index (BMI) 33.0-33.9, adult: Secondary | ICD-10-CM

## 2022-12-13 DIAGNOSIS — Z955 Presence of coronary angioplasty implant and graft: Secondary | ICD-10-CM

## 2022-12-13 DIAGNOSIS — E7800 Pure hypercholesterolemia, unspecified: Secondary | ICD-10-CM | POA: Diagnosis present

## 2022-12-13 DIAGNOSIS — T50996A Underdosing of other drugs, medicaments and biological substances, initial encounter: Secondary | ICD-10-CM | POA: Diagnosis present

## 2022-12-13 DIAGNOSIS — I2583 Coronary atherosclerosis due to lipid rich plaque: Secondary | ICD-10-CM | POA: Diagnosis not present

## 2022-12-13 DIAGNOSIS — Z79899 Other long term (current) drug therapy: Secondary | ICD-10-CM | POA: Diagnosis not present

## 2022-12-13 DIAGNOSIS — Z95828 Presence of other vascular implants and grafts: Secondary | ICD-10-CM | POA: Diagnosis not present

## 2022-12-13 DIAGNOSIS — I447 Left bundle-branch block, unspecified: Secondary | ICD-10-CM | POA: Diagnosis present

## 2022-12-13 DIAGNOSIS — Z7901 Long term (current) use of anticoagulants: Secondary | ICD-10-CM | POA: Diagnosis not present

## 2022-12-13 DIAGNOSIS — Z91119 Patient's noncompliance with dietary regimen due to unspecified reason: Secondary | ICD-10-CM

## 2022-12-13 DIAGNOSIS — I4891 Unspecified atrial fibrillation: Secondary | ICD-10-CM | POA: Diagnosis not present

## 2022-12-13 DIAGNOSIS — I272 Pulmonary hypertension, unspecified: Secondary | ICD-10-CM | POA: Diagnosis present

## 2022-12-13 DIAGNOSIS — Z7982 Long term (current) use of aspirin: Secondary | ICD-10-CM

## 2022-12-13 DIAGNOSIS — I081 Rheumatic disorders of both mitral and tricuspid valves: Secondary | ICD-10-CM | POA: Diagnosis present

## 2022-12-13 DIAGNOSIS — I499 Cardiac arrhythmia, unspecified: Secondary | ICD-10-CM

## 2022-12-13 DIAGNOSIS — I6330 Cerebral infarction due to thrombosis of unspecified cerebral artery: Secondary | ICD-10-CM | POA: Diagnosis not present

## 2022-12-13 DIAGNOSIS — I5A Non-ischemic myocardial injury (non-traumatic): Secondary | ICD-10-CM | POA: Diagnosis present

## 2022-12-13 DIAGNOSIS — Z951 Presence of aortocoronary bypass graft: Secondary | ICD-10-CM

## 2022-12-13 DIAGNOSIS — Z8673 Personal history of transient ischemic attack (TIA), and cerebral infarction without residual deficits: Secondary | ICD-10-CM | POA: Diagnosis not present

## 2022-12-13 DIAGNOSIS — Z86711 Personal history of pulmonary embolism: Secondary | ICD-10-CM

## 2022-12-13 DIAGNOSIS — I252 Old myocardial infarction: Secondary | ICD-10-CM

## 2022-12-13 DIAGNOSIS — N189 Chronic kidney disease, unspecified: Secondary | ICD-10-CM | POA: Diagnosis not present

## 2022-12-13 DIAGNOSIS — R0602 Shortness of breath: Secondary | ICD-10-CM

## 2022-12-13 DIAGNOSIS — E871 Hypo-osmolality and hyponatremia: Secondary | ICD-10-CM | POA: Diagnosis present

## 2022-12-13 DIAGNOSIS — R079 Chest pain, unspecified: Secondary | ICD-10-CM | POA: Insufficient documentation

## 2022-12-13 DIAGNOSIS — I255 Ischemic cardiomyopathy: Secondary | ICD-10-CM | POA: Diagnosis present

## 2022-12-13 DIAGNOSIS — Z87891 Personal history of nicotine dependence: Secondary | ICD-10-CM

## 2022-12-13 DIAGNOSIS — R7989 Other specified abnormal findings of blood chemistry: Secondary | ICD-10-CM | POA: Insufficient documentation

## 2022-12-13 DIAGNOSIS — Z86718 Personal history of other venous thrombosis and embolism: Secondary | ICD-10-CM

## 2022-12-13 DIAGNOSIS — I130 Hypertensive heart and chronic kidney disease with heart failure and stage 1 through stage 4 chronic kidney disease, or unspecified chronic kidney disease: Secondary | ICD-10-CM | POA: Diagnosis present

## 2022-12-13 DIAGNOSIS — Z91128 Patient's intentional underdosing of medication regimen for other reason: Secondary | ICD-10-CM

## 2022-12-13 DIAGNOSIS — Z4502 Encounter for adjustment and management of automatic implantable cardiac defibrillator: Secondary | ICD-10-CM

## 2022-12-13 DIAGNOSIS — I484 Atypical atrial flutter: Secondary | ICD-10-CM | POA: Diagnosis present

## 2022-12-13 DIAGNOSIS — Z7984 Long term (current) use of oral hypoglycemic drugs: Secondary | ICD-10-CM

## 2022-12-13 DIAGNOSIS — E66811 Obesity, class 1: Secondary | ICD-10-CM | POA: Diagnosis present

## 2022-12-13 DIAGNOSIS — I509 Heart failure, unspecified: Secondary | ICD-10-CM

## 2022-12-13 DIAGNOSIS — E1165 Type 2 diabetes mellitus with hyperglycemia: Secondary | ICD-10-CM | POA: Diagnosis present

## 2022-12-13 DIAGNOSIS — I493 Ventricular premature depolarization: Secondary | ICD-10-CM | POA: Diagnosis present

## 2022-12-13 LAB — BASIC METABOLIC PANEL
Anion gap: 9 (ref 5–15)
BUN: 36 mg/dL — ABNORMAL HIGH (ref 8–23)
CO2: 22 mmol/L (ref 22–32)
Calcium: 10.2 mg/dL (ref 8.9–10.3)
Chloride: 107 mmol/L (ref 98–111)
Creatinine, Ser: 1.95 mg/dL — ABNORMAL HIGH (ref 0.44–1.00)
GFR, Estimated: 27 mL/min — ABNORMAL LOW (ref >60)
Glucose, Bld: 150 mg/dL — ABNORMAL HIGH (ref 70–99)
Potassium: 4.2 mmol/L (ref 3.5–5.1)
Sodium: 138 mmol/L (ref 135–145)

## 2022-12-13 LAB — CBC
HCT: 39.8 % (ref 36.0–46.0)
Hemoglobin: 12.6 g/dL (ref 12.0–15.0)
MCH: 28.3 pg (ref 26.0–34.0)
MCHC: 31.7 g/dL (ref 30.0–36.0)
MCV: 89.2 fL (ref 80.0–100.0)
Platelets: 222 10*3/uL (ref 150–400)
RBC: 4.46 MIL/uL (ref 3.87–5.11)
RDW: 15.9 % — ABNORMAL HIGH (ref 11.5–15.5)
WBC: 5.8 10*3/uL (ref 4.0–10.5)
nRBC: 0 % (ref 0.0–0.2)

## 2022-12-13 LAB — D-DIMER, QUANTITATIVE: D-Dimer, Quant: 6.05 ug{FEU}/mL — ABNORMAL HIGH (ref 0.00–0.50)

## 2022-12-13 LAB — APTT: aPTT: 33 s (ref 24–36)

## 2022-12-13 LAB — TROPONIN I (HIGH SENSITIVITY)
Troponin I (High Sensitivity): 32 ng/L — ABNORMAL HIGH (ref <18)
Troponin I (High Sensitivity): 36 ng/L — ABNORMAL HIGH (ref <18)

## 2022-12-13 LAB — BRAIN NATRIURETIC PEPTIDE: B Natriuretic Peptide: 1062.6 pg/mL — ABNORMAL HIGH (ref 0.0–100.0)

## 2022-12-13 LAB — HEPARIN LEVEL (UNFRACTIONATED): Heparin Unfractionated: 0.44 [IU]/mL (ref 0.30–0.70)

## 2022-12-13 LAB — PROTIME-INR
INR: 1.2 (ref 0.8–1.2)
Prothrombin Time: 15.2 s (ref 11.4–15.2)

## 2022-12-13 MED ORDER — FUROSEMIDE 10 MG/ML IJ SOLN
60.0000 mg | Freq: Once | INTRAMUSCULAR | Status: AC
  Administered 2022-12-13: 60 mg via INTRAVENOUS
  Filled 2022-12-13: qty 6

## 2022-12-13 MED ORDER — INSULIN ASPART 100 UNIT/ML IJ SOLN
0.0000 [IU] | Freq: Three times a day (TID) | INTRAMUSCULAR | Status: DC
  Administered 2022-12-14 – 2022-12-15 (×2): 1 [IU] via SUBCUTANEOUS
  Administered 2022-12-16: 2 [IU] via SUBCUTANEOUS
  Administered 2022-12-16 – 2022-12-17 (×4): 1 [IU] via SUBCUTANEOUS
  Administered 2022-12-18: 2 [IU] via SUBCUTANEOUS
  Administered 2022-12-18 – 2022-12-20 (×4): 1 [IU] via SUBCUTANEOUS

## 2022-12-13 MED ORDER — ALBUTEROL SULFATE (2.5 MG/3ML) 0.083% IN NEBU: 2.5000 mg | INHALATION_SOLUTION | RESPIRATORY_TRACT | Status: DC | PRN

## 2022-12-13 MED ORDER — SODIUM CHLORIDE 0.9% FLUSH
3.0000 mL | Freq: Two times a day (BID) | INTRAVENOUS | Status: DC
  Administered 2022-12-14 – 2022-12-16 (×3): 3 mL via INTRAVENOUS

## 2022-12-13 MED ORDER — ASPIRIN 81 MG PO TBEC
81.0000 mg | DELAYED_RELEASE_TABLET | Freq: Every day | ORAL | Status: DC
  Administered 2022-12-14 – 2022-12-20 (×7): 81 mg via ORAL
  Filled 2022-12-13 (×7): qty 1

## 2022-12-13 MED ORDER — AMIODARONE HCL IN DEXTROSE 360-4.14 MG/200ML-% IV SOLN
30.0000 mg/h | INTRAVENOUS | Status: AC
Start: 2022-12-14 — End: ?
  Administered 2022-12-14 – 2022-12-19 (×10): 30 mg/h via INTRAVENOUS
  Filled 2022-12-13 (×11): qty 200

## 2022-12-13 MED ORDER — ACETAMINOPHEN 500 MG PO TABS: 1000.0000 mg | ORAL_TABLET | Freq: Four times a day (QID) | ORAL | Status: DC | PRN

## 2022-12-13 MED ORDER — HEPARIN (PORCINE) 25000 UT/250ML-% IV SOLN
1350.0000 [IU]/h | INTRAVENOUS | Status: DC
Start: 2022-12-13 — End: 2022-12-15
  Administered 2022-12-13: 1350 [IU]/h via INTRAVENOUS
  Filled 2022-12-13 (×3): qty 250

## 2022-12-13 MED ORDER — HEPARIN BOLUS VIA INFUSION
4000.0000 [IU] | Freq: Once | INTRAVENOUS | Status: AC
  Administered 2022-12-13: 4000 [IU] via INTRAVENOUS
  Filled 2022-12-13: qty 4000

## 2022-12-13 MED ORDER — AMIODARONE HCL IN DEXTROSE 360-4.14 MG/200ML-% IV SOLN
60.0000 mg/h | INTRAVENOUS | Status: AC
  Administered 2022-12-13: 60 mg/h via INTRAVENOUS
  Filled 2022-12-13 (×2): qty 200

## 2022-12-13 MED ORDER — ATORVASTATIN CALCIUM 40 MG PO TABS
40.0000 mg | ORAL_TABLET | Freq: Every day | ORAL | Status: DC
  Administered 2022-12-13 – 2022-12-20 (×8): 40 mg via ORAL
  Filled 2022-12-13 (×8): qty 1

## 2022-12-13 MED ORDER — AMIODARONE LOAD VIA INFUSION
150.0000 mg | Freq: Once | INTRAVENOUS | Status: AC
  Administered 2022-12-13: 150 mg via INTRAVENOUS
  Filled 2022-12-13: qty 83.34

## 2022-12-13 NOTE — H&P (Signed)
History and Physical    SHION SIVER GLO:[id number] DOB: [birth year] DOA: 12/13/2022  PCP: Rometta Emery, MD   Patient coming from: Home   Chief Complaint:  Chief Complaint  Patient presents with   Palpitations   Shortness of Breath    HPI:  Shelley Douglas is a 72 y.o. female with hx of HFrEF with CRT-D, hx VT, Afib, CAD with PCI, RCA stent, MV annuloplasty, DVT/PE, on AC, CVA, HTN, HLD, DM, who presents with few weeks of worsening dyspnea on exertion and exertional chest pain followed by 2 days of rapid HR / palpitations.  Reports prior to the onset of the symptoms had been in normal state of health.  Typically has good exertional tolerance, now unable to go about 5 feet without having to stop due to breathing and chest pressure.  Has not taken nitroglycerin.  No chest pain at time of interview.  Denies cough/cold symptoms, recent sick contacts.  Does have orthopnea and symmetric lower extremity edema.  No recent travel. Notably on medication review nonadherent with most GDMT (other than coreg), not taking diuretics, inconsistent fill history on Eliquis, not taking amiodarone  Appears she may have limited understanding of indications and use of these medications and so has discontinued inadvertently.    Review of Systems:  ROS complete and negative except as marked above   Allergies  Allergen Reactions   Plavix [Clopidogrel Bisulfate] Rash    Prior to Admission medications   Medication Sig Start Date End Date Taking? Authorizing Provider  apixaban (ELIQUIS) 5 MG TABS tablet Take 1 tablet (5 mg total) by mouth 2 (two) times daily. 07/27/22   Camnitz, Andree Coss, MD  aspirin EC 81 MG tablet Take 1 tablet (81 mg total) by mouth daily. Swallow whole. 01/14/21   Burnadette Pop, MD  carvedilol (COREG) 25 MG tablet Take 25 mg by mouth 2 (two) times daily. 11/25/17   [provider]  folic acid (FOLVITE) 1 MG tablet Take 1 mg by mouth daily.     [provider]  furosemide (LASIX) 40 MG tablet Take 1 tablet (40 mg total) by mouth daily for 5 days only, then decrease to taking 1 tablet (40 mg total) by mouth every Mon, Wed, and Friday thereafter. You may take an additional dose as needed on those days for increased swelling/weight gain. 11/05/21   Meriam Sprague, MD    Past Medical History:  Diagnosis Date   AICD (automatic cardioverter/defibrillator) present    Anxiety    pt denies this hx on 05/12/2016   CAD (coronary artery disease)    last cath 2016   Daily headache    DVT (deep venous thrombosis) (HCC) 1996   High cholesterol    History of blood transfusion    "related to pregnancy" (05/12/2016)   Hypertension    "just started taking RX" (05/12/2016)   Ischemic cardiomyopathy    Left bundle branch block    Mitral valve regurgitation    Myocardial infarction (HCC) ~ 2009 X2   Obesity    Persistent atrial fibrillation (HCC)    Pulmonary embolism (HCC) 1993   Seizures (HCC) 1970s   "I had 1; don't know what it was from" (05/12/2016)   Stroke [facility]) 2001   denies residual on 05/12/2016   Type II diabetes mellitus (HCC)     Past Surgical History:  Procedure Laterality Date   BILATERAL CARPAL TUNNEL RELEASE Bilateral 1990s   CARDIAC CATHETERIZATION N/A 09/27/2014   Procedure: Left  Heart Cath and Coronary Angiography;  Surgeon: Orpah Cobb, MD;  Location: [facility];  Service: Cardiovascular;  Laterality: N/A;   CARDIAC DEFIBRILLATOR PLACEMENT  07/24/2007   Medtronic Secura DR implanted in [place] for primary prevention   COLONOSCOPY WITH PROPOFOL N/A 04/17/2021   Procedure: COLONOSCOPY WITH PROPOFOL;  Surgeon: Jeani Hawking, MD;  Location: [facility] ENDOSCOPY;  Service: Endoscopy;  Laterality: N/A;   CORONARY ANGIOPLASTY WITH STENT PLACEMENT  ~ 2009   CORONARY ARTERY BYPASS GRAFT  ~ 2009   DILATION AND CURETTAGE OF UTERUS     EP IMPLANTABLE DEVICE N/A 07/01/2015   BiV ICD upgrade to Medtronic Viva Quad XT CRT-D device for primary  prevention   EXTERNAL EAR SURGERY Right 1990   "skin grafted from my right thigh and put on my right ear"   HERNIA REPAIR  1990's    "stomach"   POLYPECTOMY  04/17/2021   Procedure: POLYPECTOMY;  Surgeon: Jeani Hawking, MD;  Location: [facility] ENDOSCOPY;  Service: Endoscopy;;   TUBAL LIGATION     VENA CAVA FILTER PLACEMENT  1993     reports that she quit smoking about 34 years ago. Her smoking use included cigarettes. She started smoking about 38 years ago. She has never used smokeless tobacco. She reports that she does not drink alcohol and does not use drugs.  Family History  Problem Relation Age of Onset   Hypertension Unknown      Physical Exam: Vitals:   12/13/22 1622 12/13/22 1813 12/13/22 1950 12/13/22 2130  BP: (!) 125/101 (!) 130/99 138/84 (!) 120/107  Pulse: (!) 132 (!) 129 (!) 127 89  Resp: 14 19 (!) 21 20  Temp: 98 F (36.7 C) (!) 97.5 F (36.4 C)    TempSrc: Oral Oral    SpO2: 98% 99% 90% 97%  Weight:      Height:        Gen: Awake, alert, NAD  CV: Tachycardic, regular, normal S1, S2, no murmurs  Resp: Normal WOB, CTAB  Abd: Flat, normoactive, nontender MSK: Symmetric, 1+ pitting edema tapering at the midshin Skin: No rashes or lesions to exposed skin  Neuro: Alert and interactive  Psych: euthymic, appropriate    Data review:   Labs reviewed, notable for:   Creatinine 1.95, baseline 1.1-1.2 BNP 1062 High-sensitivity troponin 32 -> 36  Micro:  Results for orders placed or performed during the hospital encounter of 01/12/21  Resp Panel by RT-PCR (Flu A&B, Covid) Nasopharyngeal Swab     Status: None   Collection Time: 01/13/21 11:56 AM   Specimen: Nasopharyngeal Swab; Nasopharyngeal(NP) swabs in vial transport medium  Result Value Ref Range Status   SARS Coronavirus 2 by RT PCR NEGATIVE NEGATIVE Final    Comment: (NOTE) SARS-CoV-2 target nucleic acids are NOT DETECTED.  The SARS-CoV-2 RNA is generally detectable in upper respiratory specimens during  the acute phase of infection. The lowest concentration of SARS-CoV-2 viral copies this assay can detect is 138 copies/mL. A negative result does not preclude SARS-Cov-2 infection and should not be used as the sole basis for treatment or other patient management decisions. A negative result may occur with  improper specimen collection/handling, submission of specimen other than nasopharyngeal swab, presence of viral mutation(s) within the areas targeted by this assay, and inadequate number of viral copies(<138 copies/mL). A negative result must be combined with clinical observations, patient history, and epidemiological information. The expected result is Negative.  Fact Sheet for Patients:  BloggerCourse.com  Fact Sheet for Healthcare Providers:  SeriousBroker.it  This test is no t yet approved or cleared by the [place] and  has been authorized for detection and/or diagnosis of SARS-CoV-2 by FDA under an Emergency Use Authorization (EUA). This EUA will remain  in effect (meaning this test can be used) for the duration of the COVID-19 declaration under Section 564(b)(1) of the Act, 21 U.S.C.section 360bbb-3(b)(1), unless the authorization is terminated  or revoked sooner.       Influenza A by PCR NEGATIVE NEGATIVE Final   Influenza B by PCR NEGATIVE NEGATIVE Final    Comment: (NOTE) The Xpert Xpress SARS-CoV-2/FLU/RSV plus assay is intended as an aid in the diagnosis of influenza from Nasopharyngeal swab specimens and should not be used as a sole basis for treatment. Nasal washings and aspirates are unacceptable for Xpert Xpress SARS-CoV-2/FLU/RSV testing.  Fact Sheet for Patients: BloggerCourse.com  Fact Sheet for Healthcare Providers: SeriousBroker.it  This test is not yet approved or cleared by the [place] FDA and has been authorized for detection and/or  diagnosis of SARS-CoV-2 by FDA under an Emergency Use Authorization (EUA). This EUA will remain in effect (meaning this test can be used) for the duration of the COVID-19 declaration under Section 564(b)(1) of the Act, 21 U.S.C. section 360bbb-3(b)(1), unless the authorization is terminated or revoked.  Performed at [facility], [address], [place], [place] [address]     Imaging reviewed:  DG Chest Port 1 View  Result Date: 12/13/2022 CLINICAL DATA:  Short of breath EXAM: PORTABLE CHEST 1 VIEW COMPARISON:  10/11/2020 FINDINGS: Single frontal view of the chest demonstrates stable enlargement the cardiac silhouette allowing for differences in technique and positioning. Multi lead pacer/AICD again noted. Small right pleural effusion, with fluid again seen in the minor fissure. No airspace disease or pneumothorax. No acute bony abnormalities. IMPRESSION: 1. Small right pleural effusion, with persistent fluid in the minor fissure. 2. Stable enlarged cardiac silhouette. Electronically Signed   By: Sharlet Salina M.D.   On: 12/13/2022 15:50    EKG:  A flutter with mostly 2-1 but variable conduction rate 129, left bundle branch pattern, abberant conduction v PVC, ST depression laterally  ED Course:  Treated with amiodarone 150 mg bolus followed by drip, heparin per pharmacy for suspected PE as well as A-flutter anticoagulation.  VQ scan ordered and pending Cardiology consulted by EDP, in agreement with empiric anticoagulation.   Assessment/Plan:  72 y.o. female with hx HFrEF with CRT-D, hx VT, Afib, CAD with PCI, RCA stent, MV annuloplasty, DVT/PE, on AC, CVA, HTN, HLD, DM, who presents with few weeks of worsening dyspnea on exertion and exertional chest pain followed by 2 days of rapid HR / palpitations.  Found to have a flutter with RVR, heart failure exacerbation, concern for PE.  A flutter with RVR History of paroxysmal A-fib, recent EP visit with 4% PAF burden noted.  Has CRT-D  in place, interrogation ordered and pending by ED.  Question heart failure versus PE as provoking factor for a flutter.  Also nonadherent with amiodarone at home.  Eliquis fill history has been intermittent, last at the end of October. - Cardiology consulted by EDP, appreciate assistance - S/p amiodarone with 50 mg IV x 1, continue amiodarone drip -> Per cards rec to continue load to 5 g then started on metoprolol 12.5 mg p.o. every 6 hours - Heparin per pharmacy for A flutter/suspected PE - Evaluation for suspected PE per below - Management of heart failure per below -  Follow-up pacemaker interrogation - N.p.o. at midnight in case needs TEE cardioversion in a.m.  Possible acute PE History of prior DVT PE Intermittent nonadherence with Eliquis.  No other provoking factors. D-dimer elevated at 6.05.  Blood pressure remains within normal limit.  Diagnosis complicated by history AKI  - VQ scan ordered - Empiric heparin per pharmacy - TTE to eval for right heart strain - Duplex lower extremity, bilateral rule out DVT  Heart failure with reduced ejection fraction with acute exacerbation CRT/D in place Last TTE 11/' 23 with LVEF 30 to 35%, global hypokinesis, mild CV LH, indeterminate diastolic function, mildly reduced RV systolic function, biatrial enlargement, status post MV annuloplasty, mild to moderate mitral stenosis.  Prescribed GDMT but only taking carvedilol.  Not taking Lasix.  Has JVD and symmetric LE edema.  BNP elevated 1062. high-sensitivity Trop essentially flat 32 -> 36.  Checks x-ray with small right pleural effusion, cardiomegaly, no significant pulmonary edema.  Overall findings concerning for more so RV failure, possibly in the setting of PE or other worsening RV function.  - S/p Lasix 60 mg IV x 1 in the ED, redose in the morning, cautious with overdiuresis in setting of possible RV failure, PE - TTE ordered - Strict I's and O's, daily weights, HF navigator, TOC consult  ordered - Hold GDMT for now given decompensated heart failure, needs further education on indications for GDMT meds and assessment of barriers to taking these medicines at home  Exertional chest pressure/DOE Acute myocardial injury History of CAD with PCI, RCA stent 2 weeks of chest pressure and DOE with minimal exertion.  Symptoms possibly anginal in nature.  However suspect this is more so demand in the setting of heart failure, possible PE, a flutter/tachyarrhythmia.  Less likely type I event.  No chest pain at time of admission. - Heparin drip per above - Continue home aspirin 81 mg daily - Non adherent with statin prior to admission -> restarted on atorvastatin 40 mg daily - Check A1c and lipid panel in the morning  AKI stage I, background CKD stage II Baseline creatinine 1.1-2.  Elevated to 1.95 on admission.  Suspect cardiorenal. - Diuresis per above, trend renal function - Holding GDMT/Entresto  Chronic medical problems: History of VT, ICD in place: Resuming on amiodarone per above, pacer interrogation pending. CVA: Continue aspirin, AC, statin per above Hypertension: Hold her home carvedilol in setting of decompensated heart failure/other GDMT per above HLD: Statin per above DM: Not taking metformin at home.  SSI while inpatient   Body mass index is 33.09 kg/m.  Obesity class I affecting medical care per above  DVT prophylaxis:  IV heparin gtts Code Status:  Full Code Diet:  Diet Orders (From admission, onward)     Start     Ordered   12/13/22 2111  Diet 2 gram sodium Room service appropriate? Yes; Fluid consistency: Thin  Diet effective now       Question Answer Comment  Room service appropriate? Yes   Fluid consistency: Thin      12/13/22 2114           Family Communication: Yes discussed with husband over the phone, and son and daughter at the bedside. Consults: Cardiology Admission status:   Inpatient, Step Down Unit  Severity of Illness: The  appropriate patient status for this patient is INPATIENT. Inpatient status is judged to be reasonable and necessary in order to provide the required intensity of service to ensure the patient's safety. The patient's  presenting symptoms, physical exam findings, and initial radiographic and laboratory data in the context of their chronic comorbidities is felt to place them at high risk for further clinical deterioration. Furthermore, it is not anticipated that the patient will be medically stable for discharge from the hospital within 2 midnights of admission.   * I certify that at the point of admission it is my clinical judgment that the patient will require inpatient hospital care spanning beyond 2 midnights from the point of admission due to high intensity of service, high risk for further deterioration and high frequency of surveillance required.*   Dolly Rias, MD [facility]  How to contact the [facility] Attending or Consulting provider 7A - 7P or covering provider during after hours 7P -7A, for this patient.  Check the care team in [facility] and look for a) attending/consulting [facility] provider listed and b) the [facility] team listed Log into [email or web address] and use [place]'s universal password to access. If you do not have the password, please contact the hospital operator. Locate the [facility] provider you are looking for under [facility] and page to a number that you can be directly reached. If you still have difficulty reaching the provider, please page the [facility] (Director on Call) for the Hospitalists listed on amion for assistance.  12/13/2022, 9:35 PM

## 2022-12-13 NOTE — Progress Notes (Addendum)
PHARMACY - ANTICOAGULATION CONSULT NOTE  Pharmacy Consult for Heparin Indication: Hx DVT, rule out PE  Allergies  Allergen Reactions   Plavix [Clopidogrel Bisulfate] Rash    Patient Measurements: Height: 5\' 6"  (167.6 cm) Weight: 93 kg (205 lb) IBW/kg (Calculated) : 59.3 Heparin Dosing Weight: 79.8 kg  Vital Signs: Temp: 97.5 F (36.4 C) (12/09 1813) Temp Source: Oral (12/09 1813) BP: 138/84 (12/09 1950) Pulse Rate: 127 (12/09 1950)  Labs: Recent Labs    12/13/22 1420 12/13/22 1648  HGB 12.6  --   HCT 39.8  --   PLT 222  --   CREATININE 1.95*  --   TROPONINIHS 32* 36*    Estimated Creatinine Clearance: 30 mL/min (A) (by C-G formula based on SCr of 1.95 mg/dL (H)).   Medical History: Past Medical History:  Diagnosis Date   AICD (automatic cardioverter/defibrillator) present    Anxiety    pt denies this hx on 05/12/2016   CAD (coronary artery disease)    last cath 2016   Daily headache    DVT (deep venous thrombosis) (HCC) 1996   High cholesterol    History of blood transfusion    "related to pregnancy" (05/12/2016)   Hypertension    "just started taking RX" (05/12/2016)   Ischemic cardiomyopathy    Left bundle branch block    Mitral valve regurgitation    Myocardial infarction [facility] ~ 2009 X2   Obesity    Persistent atrial fibrillation (HCC)    Pulmonary embolism (HCC) 1993   Seizures (HCC) 1970s   "I had 1; don't know what it was from" (05/12/2016)   Stroke [facility]) 2001   denies residual on 05/12/2016   Type II diabetes mellitus (HCC)     Medications:  Scheduled:   amiodarone  150 mg Intravenous Once   furosemide  60 mg Intravenous Once   Infusions:   amiodarone     Followed by   Melene Muller ON 12/14/2022] amiodarone     PRN:   Assessment: 72 yo female history of PE/DVT on Eliquis presents with [facility] and palpitations. Pharmacy consulted to dose IV heparin for rule out PE. Of note, patient states she only takes Eliquis 4 times per week.  Last dose of  Eliquis taken this morning. Will utilize aPTT to titrate heparin due to false elevation of heparin levels with recent DOAC use.   Goal of Therapy:  Heparin level 0.3-0.7 units/ml aPTT 66-102 seconds Monitor platelets by anticoagulation protocol: Yes   Plan:  Heparin 4000 units IV bolus Heparin infusion 1350 units/hr Check aPTT and heparin level 8hr after starting infusion and daily - once levels correlate, can monitor using heparin levels only Continue to monitor H&H and platelets  Loralee Pacas, PharmD, BCPS 12/13/2022,8:39 PM  Please check AMION for all [facility] Pharmacy phone numbers After 10:00 PM, call Main Pharmacy [phone]

## 2022-12-13 NOTE — Telephone Encounter (Signed)
Patient is currently in ER waiting room.

## 2022-12-13 NOTE — ED Provider Notes (Signed)
[place] EMERGENCY DEPARTMENT AT [facility] Provider Note   CSN: [id number] Arrival date & time: 12/13/22  1339     History {Add pertinent medical, surgical, social history, OB history to HPI:1} Chief Complaint  Patient presents with   Palpitations   Shortness of Breath    Shelley Douglas is a 72 y.o. female.  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  Over the past few days started developing some dyspnea on exertion.  Also has been noticing that her heart rate gets very fast whenever she exerts herself minimally.  No fevers or chills or cough.  Has noticed some bilateral lower extremity swelling.  Says that she only takes her Eliquis 4 days a week because she fasts frequently.  Denies chest pain to me but according to her husband was complaining of chest pain yesterday.       Home Medications Prior to Admission medications   Medication Sig Start Date End Date Taking? Authorizing Provider  amiodarone (PACERONE) 200 MG tablet Take 1 tablet (200 mg total) by mouth daily. 01/04/15   Orpah Cobb, MD  apixaban (ELIQUIS) 5 MG TABS tablet Take 1 tablet (5 mg total) by mouth 2 (two) times daily. 07/27/22   Camnitz, Andree Coss, MD  aspirin EC 81 MG tablet Take 1 tablet (81 mg total) by mouth daily. Swallow whole. 01/14/21   Burnadette Pop, MD  atorvastatin (LIPITOR) 80 MG tablet Take 1 tablet (80 mg total) by mouth daily. 01/15/21   Burnadette Pop, MD  carvedilol (COREG) 25 MG tablet Take 25 mg by mouth 2 (two) times daily. 11/25/17   [provider]  dapagliflozin propanediol (FARXIGA) 10 MG TABS tablet Take 1 tablet (10 mg total) by mouth daily before breakfast. 11/04/21   Meriam Sprague, MD  digoxin (LANOXIN) 0.125 MG tablet Take 0.0625 mg by mouth  daily. 11/25/17   [provider]  folic acid (FOLVITE) 1 MG tablet Take 1 mg by mouth daily.     [provider]  furosemide (LASIX) 40 MG tablet Take 1 tablet (40 mg total) by mouth daily for 5 days only, then decrease to taking 1 tablet (40 mg total) by mouth every Mon, Wed, and Friday thereafter. You may take an additional dose as needed on those days for increased swelling/weight gain. 11/05/21   Meriam Sprague, MD  lidocaine (LIDODERM) 5 % Place 1 patch onto the skin daily. Remove & Discard patch within 12 hours or as directed by MD 07/06/21   Sabas Sous, MD  lidocaine (XYLOCAINE) 2 % solution Use as directed 10 mLs in the mouth or throat every 4 (four) hours as needed for mouth pain. 07/16/21   Arthor Captain, PA-C  metFORMIN (GLUCOPHAGE) 500 MG tablet Take 500 mg by mouth 2 (two) times daily with a meal.    [provider]  methocarbamol (ROBAXIN) 500 MG tablet Take 1 tablet (500 mg total) by mouth every 8 (eight) hours as needed for muscle spasms. 07/06/21   Sabas Sous, MD  sacubitril-valsartan (ENTRESTO) 24-26 MG Take 1 tablet by mouth 2 (two) times daily. 11/04/21   Meriam Sprague, MD      Allergies    Plavix [clopidogrel bisulfate]    Review of Systems   Review of Systems  Physical Exam Updated  Vital Signs BP (!) 125/101 (BP Location: Right Arm)   Pulse (!) 132   Temp 98 F (36.7 C) (Oral)   Resp 14   Ht 5\' 6"  (1.676 m)   Wt 93 kg   SpO2 98%   BMI 33.09 kg/m  Physical Exam Vitals and nursing note reviewed.  Constitutional:      General: She is not in acute distress.    Appearance: She is well-developed.  HENT:     Head: Normocephalic and atraumatic.     Right Ear: External ear normal.     Left Ear: External ear normal.     Nose: Nose normal.  Eyes:     Extraocular Movements: Extraocular movements intact.     Conjunctiva/sclera: Conjunctivae normal.     Pupils: Pupils are equal, round, and reactive to light.   Cardiovascular:     Rate and Rhythm: Normal rate. Rhythm irregular.     Heart sounds: No murmur heard. Pulmonary:     Effort: Pulmonary effort is normal. No respiratory distress.     Breath sounds: Normal breath sounds.  Musculoskeletal:     Cervical back: Normal range of motion and neck supple.     Right lower leg: Edema present.     Left lower leg: Edema present.  Skin:    General: Skin is warm and dry.  Neurological:     Mental Status: She is alert and oriented to person, place, and time. Mental status is at baseline.  Psychiatric:        Mood and Affect: Mood normal.     ED Results / Procedures / Treatments   Labs (all labs ordered are listed, but only abnormal results are displayed) Labs Reviewed  BASIC METABOLIC PANEL - Abnormal; Notable for the following components:      Result Value   Glucose, Bld 150 (*)    BUN 36 (*)    Creatinine, Ser 1.95 (*)    GFR, Estimated 27 (*)    All other components within normal limits  CBC - Abnormal; Notable for the following components:   RDW 15.9 (*)    All other components within normal limits  BRAIN NATRIURETIC PEPTIDE - Abnormal; Notable for the following components:   B Natriuretic Peptide 1,062.6 (*)    All other components within normal limits  TROPONIN I (HIGH SENSITIVITY) - Abnormal; Notable for the following components:   Troponin I (High Sensitivity) 32 (*)    All other components within normal limits  TROPONIN I (HIGH SENSITIVITY)    EKG None  Radiology DG Chest Port 1 View  Result Date: 12/13/2022 CLINICAL DATA:  Short of breath EXAM: PORTABLE CHEST 1 VIEW COMPARISON:  10/11/2020 FINDINGS: Single frontal view of the chest demonstrates stable enlargement the cardiac silhouette allowing for differences in technique and positioning. Multi lead pacer/AICD again noted. Small right pleural effusion, with fluid again seen in the minor fissure. No airspace disease or pneumothorax. No acute bony abnormalities. IMPRESSION:  1. Small right pleural effusion, with persistent fluid in the minor fissure. 2. Stable enlarged cardiac silhouette. Electronically Signed   By: Sharlet Salina M.D.   On: 12/13/2022 15:50    Procedures Procedures  {Document cardiac monitor, telemetry assessment procedure when appropriate:1}  Medications Ordered in ED Medications - No data to display  ED Course/ Medical Decision Making/ A&P Clinical Course as of 12/13/22 1809  Mon Dec 13, 2022  1803 B Natriuretic Peptide(!): 1,062.6 Baseline of 200 [RP]  1803 Creatinine(!): 1.95 Baseline of 1.1 [  RP]    Clinical Course User Index [RP] Rondel Baton, MD   {   Click here for ABCD2, HEART and other calculatorsREFRESH Note before signing :1}                              Medical Decision Making Amount and/or Complexity of Data Reviewed Labs: ordered. Decision-making details documented in ED Course.   ***  {Document critical care time when appropriate:1} {Document review of labs and clinical decision tools ie heart score, Chads2Vasc2 etc:1}  {Document your independent review of radiology images, and any outside records:1} {Document your discussion with family members, caretakers, and with consultants:1} {Document social determinants of health affecting pt's care:1} {Document your decision making why or why not admission, treatments were needed:1} Final Clinical Impression(s) / ED Diagnoses Final diagnoses:  None    Rx / DC Orders ED Discharge Orders     None

## 2022-12-13 NOTE — ED Triage Notes (Signed)
Pt states she feels like her heart is racing when she does anything. Pt states she also has shortness of breath. These started yesterday. Pt denies pain.

## 2022-12-13 NOTE — ED Provider Triage Note (Signed)
Emergency Medicine Provider Triage Evaluation Note  ASHIKA BENTO , a 72 y.o. female  was evaluated in triage.  Pt complains of heart racing and sob. Symptoms started yesterday. Denies chest pain. Has ICD.   Review of Systems  Positive: See above Negative: See above  Physical Exam  BP (!) 122/92 (BP Location: Right Arm)   Pulse (!) 131   Temp 97.6 F (36.4 C)   Resp 16   Ht 5\' 6"  (1.676 m)   Wt 93 kg   SpO2 98%   BMI 33.09 kg/m  Gen:   Awake, no distress   Resp:  Normal effort  MSK:   Moves extremities without difficulty  Other:    Medical Decision Making  Medically screening exam initiated at 2:10 PM.  Appropriate orders placed.  Teola Bradley was informed that the remainder of the evaluation will be completed by another provider, this initial triage assessment does not replace that evaluation, and the importance of remaining in the ED until their evaluation is complete.  Work up started   Gareth Eagle, PA-C 12/13/22 1412

## 2022-12-13 NOTE — Telephone Encounter (Signed)
Pt returning nurses call from today. Pt states that she is currently on the way to the ED, so if there is no answer when called back to LM. Please advise

## 2022-12-13 NOTE — Consult Note (Signed)
Cardiology Consultation:   Patient ID: Shelley Douglas MRN: [id number]; DOB: 11/18/1950  Admit date: 12/13/2022 Date of Consult: 12/13/2022  Primary Care Provider: Rometta Emery, MD [facility] Cardiologist: Charlton Haws, MD  [facility] Electrophysiologist:  Will Jorja Loa, MD    Patient Profile:   Shelley Douglas is a 72 y.o. 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  History of Present Illness:   Shelley Douglas states that for the past few days, she started developing dyspnea on exertion.  She has had an elevated heart rate whenever she exerts herself minimally. She has had some dizziness, and sensation like she would pass out when walking. She has also had some bilateral lower extremity swelling which is a little worse than her baseline. She takes her apixaban about 4 days per week. Had chest pain, which she describes as "heaviness"; this chest pain only occurs when walking. This chest pain does not radiate, and she describes it as very uncomfortable. She has had this chest pain since having open heart surgery. All these symptoms started a couple of days ago. She has chronic SOB that has been unchanged.   Since arriving to the ED, she reports feeling better. She thinks her heart is fluttering less. She denies current chest pain.  She denies n/v, fevers chills, changes in medicaitons, recent sickness or illness.  She last took her carvedilol 2 months ago. She does not have a particular reason that she stopped taking her carvedilol; just that she got tired of taking medications.    Past Medical History:  Diagnosis Date   AICD (automatic cardioverter/defibrillator) present    Anxiety    pt denies this hx on 05/12/2016   CAD (coronary artery disease)    last cath 2016   Daily  headache    DVT (deep venous thrombosis) (HCC) 1996   High cholesterol    History of blood transfusion    "related to pregnancy" (05/12/2016)   Hypertension    "just started taking RX" (05/12/2016)   Ischemic cardiomyopathy    Left bundle branch block    Mitral valve regurgitation    Myocardial infarction (HCC) ~ 2009 X2   Obesity    Persistent atrial fibrillation (HCC)    Pulmonary embolism (HCC) 1993   Seizures (HCC) 1970s   "I had 1; don't know what it was from" (05/12/2016)   Stroke [facility]) 2001   denies residual on 05/12/2016   Type II diabetes mellitus (HCC)     Past Surgical History:  Procedure Laterality Date   BILATERAL CARPAL TUNNEL RELEASE Bilateral 1990s   CARDIAC CATHETERIZATION N/A 09/27/2014   Procedure: Left Heart Cath and Coronary Angiography;  Surgeon: Orpah Cobb, MD;  Location: [facility] INVASIVE CV LAB;  Service: Cardiovascular;  Laterality: N/A;   CARDIAC DEFIBRILLATOR PLACEMENT  07/24/2007   Medtronic Secura DR implanted in [place] for primary prevention   COLONOSCOPY WITH PROPOFOL N/A 04/17/2021   Procedure: COLONOSCOPY WITH PROPOFOL;  Surgeon: Jeani Hawking, MD;  Location: [facility] ENDOSCOPY;  Service: Endoscopy;  Laterality: N/A;   CORONARY ANGIOPLASTY WITH STENT PLACEMENT  ~ 2009   CORONARY ARTERY BYPASS GRAFT  ~ 2009   DILATION AND CURETTAGE OF UTERUS     EP IMPLANTABLE DEVICE N/A 07/01/2015   BiV ICD upgrade to Medtronic [employer]  XT CRT-D device for primary prevention   EXTERNAL EAR SURGERY Right 1990   "skin grafted from my right thigh and put on my right ear"   HERNIA REPAIR  1990's    "stomach"   POLYPECTOMY  04/17/2021   Procedure: POLYPECTOMY;  Surgeon: Jeani Hawking, MD;  Location: [facility] ENDOSCOPY;  Service: Endoscopy;;   TUBAL LIGATION     VENA CAVA FILTER PLACEMENT  1993     Home Medications:  Prior to Admission medications   Medication Sig Start Date End Date Taking? Authorizing Provider  apixaban (ELIQUIS) 5 MG TABS tablet Take 1 tablet (5 mg total) by  mouth 2 (two) times daily. 07/27/22   Camnitz, Andree Coss, MD  aspirin EC 81 MG tablet Take 1 tablet (81 mg total) by mouth daily. Swallow whole. 01/14/21   Burnadette Pop, MD  carvedilol (COREG) 25 MG tablet Take 25 mg by mouth 2 (two) times daily. 11/25/17   [provider]  folic acid (FOLVITE) 1 MG tablet Take 1 mg by mouth daily.     [provider]  furosemide (LASIX) 40 MG tablet Take 1 tablet (40 mg total) by mouth daily for 5 days only, then decrease to taking 1 tablet (40 mg total) by mouth every Mon, Wed, and Friday thereafter. You may take an additional dose as needed on those days for increased swelling/weight gain. 11/05/21   Meriam Sprague, MD    Inpatient Medications: Scheduled Meds:  Continuous Infusions:  amiodarone 60 mg/hr (12/13/22 2053)   Followed by   Melene Muller ON 12/14/2022] amiodarone     PRN Meds:   Allergies:    Allergies  Allergen Reactions   Plavix [Clopidogrel Bisulfate] Rash    Social History:   Social History   Socioeconomic History   Marital status: Married    Spouse name: Not on file   Number of children: Not on file   Years of education: Not on file   Highest education level: Not on file  Occupational History   Occupation: disabled  Tobacco Use   Smoking status: Former    Current packs/day: 0.00    Types: Cigarettes    Start date: 01/05/1984    Quit date: 01/05/1988    Years since quitting: 34.9   Smokeless tobacco: Never   Tobacco comments:    05/12/2016 "someday smoker when I did smoked; never smoked much"  Vaping Use   Vaping status: Never Used  Substance and Sexual Activity   Alcohol use: No   Drug use: No   Sexual activity: Not Currently  Other Topics Concern   Not on file  Social History Narrative   Pt lives in [place] with spouse.   Disabled   Social Determinants of Health   Financial Resource Strain: Not on file  Food Insecurity: Not on file  Transportation Needs: Not on file  Physical Activity:  Not on file  Stress: Not on file  Social Connections: Not on file  Intimate Partner Violence: Not on file    Family History:    Family History  Problem Relation Age of Onset   Hypertension Unknown      ROS:   Review of Systems: [y] = yes, [ ]  = no      General: Weight gain [ ] ; Weight loss [place] ]; Anorexia [ ] ; Fatigue [ ] ; Fever [ ] ; Chills [ ] ; Weakness [ ]    Cardiac: Chest pain/pressure [ y]; Resting SOB [ ] ; Exertional SOB [place] ]; Orthopnea [ ] ; Pedal Edema [  y]; Palpitations [ ] ; Syncope [ ] ; Presyncope [place] ]; Paroxysmal nocturnal dyspnea [ ]    Pulmonary: Cough [ ] ; Wheezing [ ] ; Hemoptysis [ ] ; Sputum [ ] ; Snoring [ ]    GI: Vomiting [ ] ; Dysphagia [ ] ; Melena [ ] ; Hematochezia [ ] ; Heartburn [ ] ; Abdominal pain [ ] ; Constipation [ ] ; Diarrhea [ ] ; BRBPR [ ]    GU: Hematuria [ ] ; Dysuria [ ] ; Nocturia [ ]  Vascular: Pain in legs with walking [ ] ; Pain in feet with lying flat [ ] ; Non-healing sores [ ] ; Stroke [ ] ; TIA [ ] ; Slurred speech [ ] ;   Neuro: Headaches [ ] ; Vertigo [ ] ; Seizures [ ] ; Paresthesias [ ] ;Blurred vision [ ] ; Diplopia [ ] ; Vision changes [ ]    Ortho/Skin: Arthritis [ ] ; Joint pain [ ] ; Muscle pain [ ] ; Joint swelling [ ] ; Back Pain [ ] ; Rash [ ]    Psych: Depression [ ] ; Anxiety [ ]    Heme: Bleeding problems [ ] ; Clotting disorders [ ] ; Anemia [ ]    Endocrine: Diabetes [ ] ; Thyroid dysfunction [ ]    Physical Exam/Data:   Vitals:   12/13/22 1356 12/13/22 1622 12/13/22 1813 12/13/22 1950  BP:  (!) 125/101 (!) 130/99 138/84  Pulse:  (!) 132 (!) 129 (!) 127  Resp:  14 19 (!) 21  Temp:  98 F (36.7 C) (!) 97.5 F (36.4 C)   TempSrc:  Oral Oral   SpO2:  98% 99% 90%  Weight: 93 kg     Height: 5\' 6"  (1.676 m)      No intake or output data in the 24 hours ending 12/13/22 2103    12/13/2022    1:56 PM 11/29/2022    8:02 AM 06/28/2022    9:12 AM  Last 3 Weights  Weight (lbs) 205 lb 205 lb 6.4 oz 205 lb 9.6 oz  Weight (kg) 92.987 kg 93.169 kg 93.26 kg     Body  mass index is 33.09 kg/m.  General:  Well nourished, well developed, in no acute distress HEENT: normal Lymph: no adenopathy Neck: no JVD Endocrine:  No thryomegaly Vascular: No carotid bruits; FA pulses 2+ bilaterally without bruits  Cardiac:  normal S1, S2; RRR; no murmur; tachycardic Lungs:  clear to auscultation bilaterally, no wheezing, rhonchi or rales  Abd: soft, nontender, no hepatomegaly  Ext: 2+ LE edema Musculoskeletal:  No deformities, BUE and BLE strength normal and equal Skin: warm and dry  Neuro:  CNs 2-12 intact, no focal abnormalities noted Psych:  Normal affect   EKG:  The EKG was personally reviewed and demonstrates:  atypical atrial flutter with 2:1 conduction vs atrial tachycardia. Ventricular rate 129 bpm Telemetry:  Telemetry was personally reviewed and demonstrates: narrow complex tachycardia with ventricular rates in the 120s., frequent PVC  Relevant CV Studies:  TTE (11/17/2021): IMPRESSIONS     1. Left ventricular ejection fraction, by estimation, is 30 to 35%. The  left ventricle has moderately decreased function. The left ventricle  demonstrates global hypokinesis. There is mild concentric left ventricular  hypertrophy. Left ventricular  diastolic parameters are indeterminate.   2. Right ventricular systolic function is mildly reduced. The right  ventricular size is mildly enlarged. There is normal pulmonary artery  systolic pressure.   3. Left atrial size was moderately dilated.   4. Right atrial size was moderately dilated.   5. S/p mitral valve annuloplasty. MV mean gradient ~4; mild-moderate  mitral stenosis. Trivial mitral valve regurgitation.   6.  The aortic valve was not well visualized. Aortic valve regurgitation  is not visualized.   Laboratory Data:  High Sensitivity Troponin:   Recent Labs  Lab 12/13/22 1420 12/13/22 1648  TROPONINIHS 32* 36*     Chemistry Recent Labs  Lab 12/13/22 1420  NA 138  K 4.2  CL 107  CO2 22   GLUCOSE 150*  BUN 36*  CREATININE 1.95*  CALCIUM 10.2  GFRNONAA 27*  ANIONGAP 9    No results for input(s): "PROT", "ALBUMIN", "AST", "ALT", "ALKPHOS", "BILITOT" in the last 168 hours. Hematology Recent Labs  Lab 12/13/22 1420  WBC 5.8  RBC 4.46  HGB 12.6  HCT 39.8  MCV 89.2  MCH 28.3  MCHC 31.7  RDW 15.9*  PLT 222   BNP Recent Labs  Lab 12/13/22 1420  BNP 1,062.6*    DDimer  Recent Labs  Lab 12/13/22 1420  DDIMER 6.05*    Radiology/Studies:  DG Chest Port 1 View  Result Date: 12/13/2022 CLINICAL DATA:  Short of breath EXAM: PORTABLE CHEST 1 VIEW COMPARISON:  10/11/2020 FINDINGS: Single frontal view of the chest demonstrates stable enlargement the cardiac silhouette allowing for differences in technique and positioning. Multi lead pacer/AICD again noted. Small right pleural effusion, with fluid again seen in the minor fissure. No airspace disease or pneumothorax. No acute bony abnormalities. IMPRESSION: 1. Small right pleural effusion, with persistent fluid in the minor fissure. 2. Stable enlarged cardiac silhouette. Electronically Signed   By: Sharlet Salina M.D.   On: 12/13/2022 15:50   {   Assessment and Plan:   Atrial Flutter with RVR vs Atrial Tachycardia Chronic Anticoagulation Hx Paroxysmal Atrial Fibrillation Hx of ICD shock for likely atrial fibrillation vs SVT Hx Nonsustained VT/PVCs Presenting with palpitations, presyncope, and dizziness, found to be in either atypical atrial flutter with RVR vs atrial tachycardia. She has not been compliant with her apixaban (takes 4 days per week), and has not taken carvedilol for 2 months. Has a history of atrial flutter. No prior ablations.CHA2DS2-VASc of 6. She has historically been on amiodarone for her hx of VT and PVCs. For her acute atrial flutter vs Atach with RVR, she would benefit from rate control with amiodarone and low dose metoprolol. If she does not convert to sinus rhythm with these, may need TEE/DCCV in  upcoming days. -Heparin gtt -start amiodarone load to 5g, then 200mg  daily -start metoprolol tartrate 12.5mg  q6h this evening -d/c home carvedilol (not taking, low systolics at baseline at home) -Device interrogation in am  -npo at midnight if need TEE/DCCV tomorrow (pending clinical course)  ICM with EF 30% CAD s/p RCA PCI HTN HLD Mild HF exacerbation today with worsening LE edema then her baseline. Would benefit from IV furosemide while inpatient. -Intermittent spot dosing of IV furosemide per primary team -Restart home ASA -Srestart prior statin -Cardiology to following and discuss GDMT -Needs medication compliance counseling       For questions or updates, please contact [facility] Please consult [email or web address] for contact info under     Signed, Freddy Finner, MD  12/13/2022 9:03 PM

## 2022-12-13 NOTE — Telephone Encounter (Signed)
Spoke to patients daughter Ms. [place], requested her to pass message along to give the device clinic a call. States she will pass along message to patient.

## 2022-12-13 NOTE — Telephone Encounter (Signed)
Attempted to call pt back. No answer, LMTCB.

## 2022-12-14 ENCOUNTER — Other Ambulatory Visit: Payer: Self-pay

## 2022-12-14 ENCOUNTER — Inpatient Hospital Stay (HOSPITAL_COMMUNITY): Payer: Medicare Other

## 2022-12-14 ENCOUNTER — Encounter (HOSPITAL_COMMUNITY): Payer: Self-pay | Admitting: Anesthesiology

## 2022-12-14 DIAGNOSIS — M7989 Other specified soft tissue disorders: Secondary | ICD-10-CM

## 2022-12-14 DIAGNOSIS — I5023 Acute on chronic systolic (congestive) heart failure: Secondary | ICD-10-CM | POA: Diagnosis not present

## 2022-12-14 DIAGNOSIS — I4892 Unspecified atrial flutter: Secondary | ICD-10-CM | POA: Diagnosis not present

## 2022-12-14 LAB — BASIC METABOLIC PANEL
Anion gap: 9 (ref 5–15)
BUN: 33 mg/dL — ABNORMAL HIGH (ref 8–23)
CO2: 25 mmol/L (ref 22–32)
Calcium: 10.3 mg/dL (ref 8.9–10.3)
Chloride: 104 mmol/L (ref 98–111)
Creatinine, Ser: 1.51 mg/dL — ABNORMAL HIGH (ref 0.44–1.00)
GFR, Estimated: 37 mL/min — ABNORMAL LOW (ref >60)
Glucose, Bld: 134 mg/dL — ABNORMAL HIGH (ref 70–99)
Potassium: 3.8 mmol/L (ref 3.5–5.1)
Sodium: 138 mmol/L (ref 135–145)

## 2022-12-14 LAB — APTT
aPTT: 166 s (ref 24–36)
aPTT: 126 s — ABNORMAL HIGH (ref 24–36)
aPTT: 99 s — ABNORMAL HIGH (ref 24–36)

## 2022-12-14 LAB — CBC
HCT: 37 % (ref 36.0–46.0)
Hemoglobin: 11.8 g/dL — ABNORMAL LOW (ref 12.0–15.0)
MCH: 28.3 pg (ref 26.0–34.0)
MCHC: 31.9 g/dL (ref 30.0–36.0)
MCV: 88.7 fL (ref 80.0–100.0)
Platelets: 213 10*3/uL (ref 150–400)
RBC: 4.17 MIL/uL (ref 3.87–5.11)
RDW: 16.3 % — ABNORMAL HIGH (ref 11.5–15.5)
WBC: 5.4 10*3/uL (ref 4.0–10.5)
nRBC: 0 % (ref 0.0–0.2)

## 2022-12-14 LAB — GLUCOSE, CAPILLARY
Glucose-Capillary: 188 mg/dL — ABNORMAL HIGH (ref 70–99)
Glucose-Capillary: 127 mg/dL — ABNORMAL HIGH (ref 70–99)

## 2022-12-14 LAB — ECHOCARDIOGRAM COMPLETE
Calc EF: 22.3 %
Height: 66 in
MV M vel: 3.94 m/s
MV Peak grad: 62.1 mm[Hg]
MV VTI: 1.01 cm2
Radius: 0.6 cm
S' Lateral: 4.7 cm
Single Plane A2C EF: 24 %
Single Plane A4C EF: 17 %
Weight: 3280 [oz_av]

## 2022-12-14 LAB — LIPID PANEL
Cholesterol: 211 mg/dL — ABNORMAL HIGH (ref 0–200)
HDL: 51 mg/dL (ref >40)
LDL Cholesterol: 146 mg/dL — ABNORMAL HIGH (ref 0–99)
Total CHOL/HDL Ratio: 4.1 {ratio}
Triglycerides: 70 mg/dL (ref <150)
VLDL: 14 mg/dL (ref 0–40)

## 2022-12-14 LAB — HEPARIN LEVEL (UNFRACTIONATED)
Heparin Unfractionated: 0.99 [IU]/mL — ABNORMAL HIGH (ref 0.30–0.70)
Heparin Unfractionated: 0.74 [IU]/mL — ABNORMAL HIGH (ref 0.30–0.70)

## 2022-12-14 LAB — PHOSPHORUS: Phosphorus: 3.3 mg/dL (ref 2.5–4.6)

## 2022-12-14 LAB — CBG MONITORING, ED
Glucose-Capillary: 123 mg/dL — ABNORMAL HIGH (ref 70–99)
Glucose-Capillary: 127 mg/dL — ABNORMAL HIGH (ref 70–99)

## 2022-12-14 LAB — TROPONIN I (HIGH SENSITIVITY): Troponin I (High Sensitivity): 36 ng/L — ABNORMAL HIGH (ref <18)

## 2022-12-14 LAB — TSH: TSH: 1.021 u[IU]/mL (ref 0.350–4.500)

## 2022-12-14 LAB — HEMOGLOBIN A1C
Hgb A1c MFr Bld: 6.7 % — ABNORMAL HIGH (ref 4.8–5.6)
Mean Plasma Glucose: 145.59 mg/dL

## 2022-12-14 LAB — MAGNESIUM: Magnesium: 1.8 mg/dL (ref 1.7–2.4)

## 2022-12-14 LAB — LACTIC ACID, PLASMA: Lactic Acid, Venous: 1.1 mmol/L (ref 0.5–1.9)

## 2022-12-14 MED ORDER — MAGNESIUM SULFATE 2 GM/50ML IV SOLN
2.0000 g | Freq: Once | INTRAVENOUS | Status: AC
  Administered 2022-12-14: 2 g via INTRAVENOUS
  Filled 2022-12-14: qty 50

## 2022-12-14 MED ORDER — ORAL CARE MOUTH RINSE: 15.0000 mL | OROMUCOSAL | Status: DC | PRN

## 2022-12-14 MED ORDER — CARVEDILOL 3.125 MG PO TABS
6.2500 mg | ORAL_TABLET | Freq: Two times a day (BID) | ORAL | Status: DC
  Administered 2022-12-14: 6.25 mg via ORAL
  Filled 2022-12-14: qty 2

## 2022-12-14 MED ORDER — CHLORHEXIDINE GLUCONATE CLOTH 2 % EX PADS
6.0000 | MEDICATED_PAD | Freq: Every day | CUTANEOUS | Status: DC
  Administered 2022-12-14 – 2022-12-20 (×7): 6 via TOPICAL

## 2022-12-14 MED ORDER — FUROSEMIDE 10 MG/ML IJ SOLN
40.0000 mg | Freq: Two times a day (BID) | INTRAMUSCULAR | Status: AC
  Administered 2022-12-14 – 2022-12-15 (×2): 40 mg via INTRAVENOUS
  Filled 2022-12-14 (×2): qty 4

## 2022-12-14 MED ORDER — TECHNETIUM TO 99M ALBUMIN AGGREGATED
4.4000 | Freq: Once | INTRAVENOUS | Status: AC | PRN
  Administered 2022-12-14: 4.4 via INTRAVENOUS

## 2022-12-14 NOTE — Progress Notes (Addendum)
Rounding Note    Patient Name: Shelley Douglas Date of Encounter: 12/14/2022  [facility] Cardiologist: Charlton Haws, MD   Subjective   She reported rapid heart rates.  Denies shortness of breath or chest pressure  Inpatient Medications    Scheduled Meds:  aspirin EC  81 mg Oral Daily   atorvastatin  40 mg Oral Daily   carvedilol  6.25 mg Oral BID WC   furosemide  40 mg Intravenous BID   insulin aspart  0-6 Units Subcutaneous TID WC   sodium chloride flush  3 mL Intravenous Q12H   Continuous Infusions:  amiodarone 30 mg/hr (12/14/22 1021)   heparin 1,150 Units/hr (12/14/22 1031)   magnesium sulfate bolus IVPB 2 g (12/14/22 1031)   PRN Meds: acetaminophen, albuterol   Vital Signs    Vitals:   12/14/22 0713 12/14/22 1021 12/14/22 1023 12/14/22 1026  BP: 128/81  (!) 144/81 (!) 144/81  Pulse: 94 86 (!) 56 100  Resp: 19 (!) 24 19   Temp: 97.6 F (36.4 C)     TempSrc: Oral     SpO2: 97% 96% 97%   Weight:      Height:        Intake/Output Summary (Last 24 hours) at 12/14/2022 1046 Last data filed at 12/14/2022 0600 Gross per 24 hour  Intake --  Output 1375 ml  Net -1375 ml      12/13/2022    1:56 PM 11/29/2022    8:02 AM 06/28/2022    9:12 AM  Last 3 Weights  Weight (lbs) 205 lb 205 lb 6.4 oz 205 lb 9.6 oz  Weight (kg) 92.987 kg 93.169 kg 93.26 kg      Telemetry    Atypical atrial flutter, with V pacing and intrinsic rhythm - Personally Reviewed  ECG    Atypical atrial flutter, V paced - Personally Reviewed  Physical Exam   Vitals:   12/14/22 1023 12/14/22 1026  BP: (!) 144/81 (!) 144/81  Pulse: (!) 56 100  Resp: 19   Temp:    SpO2: 97%     GEN: No acute distress.   Cardiac: IRRR, no murmurs, rubs, or gallops.  Respiratory: Clear to auscultation bilaterally. GI: Soft, nontender, non-distended  MS: No edema; No deformity. Neuro:  Nonfocal  Psych: Normal affect   Labs    High Sensitivity Troponin:   Recent Labs  Lab  12/13/22 1420 12/13/22 1648 12/14/22 0301  TROPONINIHS 32* 36* 36*     Chemistry Recent Labs  Lab 12/13/22 1420 12/14/22 0301  NA 138 138  K 4.2 3.8  CL 107 104  CO2 22 25  GLUCOSE 150* 134*  BUN 36* 33*  CREATININE 1.95* 1.51*  CALCIUM 10.2 10.3  MG  --  1.8  GFRNONAA 27* 37*  ANIONGAP 9 9    Lipids  Recent Labs  Lab 12/14/22 0301  CHOL 211*  TRIG 70  HDL 51  LDLCALC 146*  CHOLHDL 4.1    Hematology Recent Labs  Lab 12/13/22 1420 12/14/22 0301  WBC 5.8 5.4  RBC 4.46 4.17  HGB 12.6 11.8*  HCT 39.8 37.0  MCV 89.2 88.7  MCH 28.3 28.3  MCHC 31.7 31.9  RDW 15.9* 16.3*  PLT 222 213   Thyroid  Recent Labs  Lab 12/14/22 0301  TSH 1.021    BNP Recent Labs  Lab 12/13/22 1420  BNP 1,062.6*    DDimer  Recent Labs  Lab 12/13/22 1420  DDIMER 6.05*  Radiology    DG Chest Port 1 View  Result Date: 12/13/2022 CLINICAL DATA:  Short of breath EXAM: PORTABLE CHEST 1 VIEW COMPARISON:  10/11/2020 FINDINGS: Single frontal view of the chest demonstrates stable enlargement the cardiac silhouette allowing for differences in technique and positioning. Multi lead pacer/AICD again noted. Small right pleural effusion, with fluid again seen in the minor fissure. No airspace disease or pneumothorax. No acute bony abnormalities. IMPRESSION: 1. Small right pleural effusion, with persistent fluid in the minor fissure. 2. Stable enlarged cardiac silhouette. Electronically Signed   By: Sharlet Salina M.D.   On: 12/13/2022 15:50    Cardiac Studies   Her EF is < 20% Pending full read  Patient Profile     LAZIYAH KWIECINSKI is a 72 y.o. 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   Assessment & Plan    Atypical atrial flutter with RVR -Plan for TEE cardioversion  tomorrow; no spots  left for today -Continue amiodarone -Continue heparin drip  Informed Consent   Shared Decision Making/Informed Consent   The risks [stroke, cardiac arrhythmias rarely resulting in the need for a temporary or permanent pacemaker, skin irritation or burns, esophageal damage, perforation (1:10,000 risk), bleeding, pharyngeal hematoma as well as other potential complications associated with conscious sedation including aspiration, arrhythmia, respiratory failure and death], benefits (treatment guidance, restoration of normal sinus rhythm, diagnostic support) and alternatives of a transesophageal echocardiogram guided cardioversion were discussed in detail with Ms. Marion and she is willing to proceed.      ICM EF 30%-history of VT status post Medtronic CRT-D S/p RCA PCI EF now looks less than 20% likely tachycardia mediated.  BNP 1062.  Chest x-ray shows a small right pleural effusion.  -With her EF still low we will hold her beta-blocker; will hold additional GDMT as well -She is not grossly overloaded can continue IV diuretics -Continue aspirin and statin    Time Spent Directly with Patient:  I have spent a total of 35 minutes with the patient reviewing hospital notes, telemetry, EKGs, labs and examining the patient as well as establishing an assessment and plan that was discussed personally with the patient.     For questions or updates, please contact [facility] Please consult [email or web address] for contact info under        Signed, Maisie Fus, MD  12/14/2022, 10:46 AM

## 2022-12-14 NOTE — Progress Notes (Signed)
Paged by RN reporting patient with sustained atrial flutter RVR 120s throughout the day, no change of symptoms or VS otherwise, question if additional meds need to be given for rate control. Recommend continue amiodarone at 30mg /hr, if she become further elevated with ventricular rate or develop instability overnight, can consider re-bolus amiodarone versus urgent DCCV; if remains at this rate, no change needed. TEE/DCCV planned tomorrow.

## 2022-12-14 NOTE — Telephone Encounter (Signed)
Pt is still in hospital

## 2022-12-14 NOTE — Progress Notes (Addendum)
PHARMACY - ANTICOAGULATION CONSULT NOTE  Pharmacy Consult for Heparin Indication: Hx DVT, rule out PE  Allergies  Allergen Reactions   Plavix [Clopidogrel Bisulfate] Rash    Patient Measurements: Height: 5\' 6"  (167.6 cm) Weight: 93 kg (205 lb) IBW/kg (Calculated) : 59.3 Heparin Dosing Weight: 79.8 kg  Vital Signs: Temp: 97.6 F (36.4 C) (12/10 0713) Temp Source: Oral (12/10 0713) BP: 128/81 (12/10 0713) Pulse Rate: 94 (12/10 0713)  Labs: Recent Labs    12/13/22 1420 12/13/22 1648 12/13/22 2047 12/14/22 0301 12/14/22 0705  HGB 12.6  --   --  11.8*  --   HCT 39.8  --   --  37.0  --   PLT 222  --   --  213  --   APTT  --   --  33  --  166*  LABPROT  --   --  15.2  --   --   INR  --   --  1.2  --   --   HEPARINUNFRC  --   --  0.44  --  0.99*  CREATININE 1.95*  --   --  1.51*  --   TROPONINIHS 32* 36*  --  36*  --     Estimated Creatinine Clearance: 38.7 mL/min (A) (by C-G formula based on SCr of 1.51 mg/dL (H)).   Medical History: Past Medical History:  Diagnosis Date   AICD (automatic cardioverter/defibrillator) present    Anxiety    pt denies this hx on 05/12/2016   CAD (coronary artery disease)    last cath 2016   Daily headache    DVT (deep venous thrombosis) (HCC) 1996   High cholesterol    History of blood transfusion    "related to pregnancy" (05/12/2016)   Hypertension    "just started taking RX" (05/12/2016)   Ischemic cardiomyopathy    Left bundle branch block    Mitral valve regurgitation    Myocardial infarction [facility]) ~ 2009 X2   Obesity    Persistent atrial fibrillation (HCC)    Pulmonary embolism (HCC) 1993   Seizures (HCC) 1970s   "I had 1; don't know what it was from" (05/12/2016)   Stroke [facility]) 2001   denies residual on 05/12/2016   Type II diabetes mellitus (HCC)     Medications:  Scheduled:   aspirin EC  81 mg Oral Daily   atorvastatin  40 mg Oral Daily   insulin aspart  0-6 Units Subcutaneous TID WC   sodium chloride flush  3 mL  Intravenous Q12H   Infusions:   amiodarone 30 mg/hr (12/14/22 0157)   heparin Stopped (12/14/22 0816)   magnesium sulfate bolus IVPB     PRN:   Assessment: 72 yo female history of PE/DVT on Eliquis presents with [facility] and palpitations. Pharmacy consulted to dose IV heparin for rule out PE. Of note, patient states she only takes Eliquis 4 times per week.  Both anti-Xa and aPTT elevated this AM, unknown if drawn correctly, repeat aPTT supratherapeutic at 126 seconds however MD instructed RN to hold gtt prior to drawing lab, so likely real level closer to the first result drawn  Goal of Therapy:  Heparin level 0.3-0.7 units/ml aPTT 66-102 seconds Monitor platelets by anticoagulation protocol: Yes   Plan:  Heparin gtt has been held appropriate amount of time s/p re-draw and awaiting lab results Will restart heparin gtt at reduced rate of 1150 units/hr F/u 8 hour aPTT/HL F/u doppler results and VQ scan  Daylene Posey,  PharmD, [facility] Clinical Pharmacist ED Pharmacist Phone # [phone] 12/14/2022 10:12 AM

## 2022-12-14 NOTE — Consult Note (Addendum)
Advanced Heart Failure Team Consult Note   Primary Physician: Rometta Emery, MD PCP-Cardiologist:  Charlton Haws, MD  Reason for Consultation: acute on chronic systolic heart failure   HPI:    Shelley Douglas is seen today for evaluation of acute on chronic systolic heart failure at the request of Dr. Wyline Mood, Cardiology.   72 y/o female w/ h/o chronic systolic heart failure dating back to 2009 due to ICM, CAD w/ remote inferior infarct w/ stenting to the RCA. Initial MI in 2009 w/ BMS x 2 to prox and mid RCA.  Most recent LHC in 2016 showed 70% ISR of pRCA stent, 80% ISR of mRCA stent, CTO of distal RCA and 30% stenosis of pLAD. Disease was treated medically given no viability on nuclear stress test. Repeat nuclear study in 2018 showed inferior infarct but no ischemia.   S/p ICD w/ upgrade to CRT-D in 2017 for LBBB. Also w/ h/o MR s/p remote MV annuloplasty in 1996, atrial flutter/fibrillation, DVT/PE s/p IVC filter, on Eliquis, CVA, HTN, HLD, Type 2DM, CKD IIIa and b/l carpal tunnel s/p b/l CTR.   Most recent echo 11/2021 EF 30-35%, global HK, mild LVH, mod BAE, + stable MV annuloplasty w/ mild-mod MS mean gradient 4 mmHg, RV mildly reduced.   Presented to ED overnight w/ 2 wk h/o progressive dyspnea and LEE. First had DOE and progressed to resting symptoms. No chest pain. Admits to dietary indiscretion over the thanksgiving holiday and also poor compliance w/ home lasix. Supposed to take daily but usually only takes 2 days a week on average.   In ED, found to be in Afib w/ RVR and volume overloaded w/ a/c CHF. Also w/ AKI. SCr 1.95 (b/l 1.1). BNP 1,062. CXR w/ cardiomegaly + small rt pleural effusion w/ persistent fluid in the minor fissure. EKG AFL 129 bpm. BPs normotensive to mildly elevated. Hs trop 32>>36>>36. D-dimer 6.05. She admits that she sometime forgets to take Eliquis. Has missed doses in the last month. V/Q ordered.    Device interrogation shows she has been in Afib x  4 days w/ rapid ventricular rates. 90% BiV pacing. Not very active at baseline. Interrogation shows < 1hr of activity a day over the last 12 months.   Echo completed today. Official read pending but per cardiology review EF ~ < 20%. AHF team consulted to assist w/ further management.    Review of Systems: [y] = yes, [ ]  = no   General: Weight gain [ ] ; Weight loss [ ] ; Anorexia [ ] ; Fatigue [ Y]; Fever [ ] ; Chills [ ] ; Weakness [ ]   Cardiac: Chest pain/pressure [ ] ; Resting SOB [ ] ; Exertional SOB [ Y]; Orthopnea [Y ]; Pedal Edema [Y ]; Palpitations [ ] ; Syncope [ ] ; Presyncope [ ] ; Paroxysmal nocturnal dyspnea[ ]   Pulmonary: Cough [ ] ; Wheezing[ ] ; Hemoptysis[ ] ; Sputum [ ] ; Snoring [ ]   GI: Vomiting[ ] ; Dysphagia[ ] ; Melena[ ] ; Hematochezia [ ] ; Heartburn[ ] ; Abdominal pain [ ] ; Constipation [ ] ; Diarrhea [ ] ; BRBPR [ ]   GU: Hematuria[ ] ; Dysuria [ ] ; Nocturia[ ]   Vascular: Pain in legs with walking [ ] ; Pain in feet with lying flat [ ] ; Non-healing sores [ ] ; Stroke [ ] ; TIA [ ] ; Slurred speech [ ] ;  Neuro: Headaches[ ] ; Vertigo[ ] ; Seizures[ ] ; Paresthesias[ ] ;Blurred vision [ ] ; Diplopia [ ] ; Vision changes [ ]   Ortho/Skin: Arthritis [ ] ; Joint pain [ ] ; Muscle pain [ ] ; Joint swelling [ ] ;  Back Pain [ ] ; Rash [ ]   Psych: Depression[ ] ; Anxiety[ ]   Heme: Bleeding problems [ ] ; Clotting disorders [ ] ; Anemia [ ]   Endocrine: Diabetes [ Y]; Thyroid dysfunction[ ]   Home Medications Prior to Admission medications   Medication Sig Start Date End Date Taking? Authorizing Provider  apixaban (ELIQUIS) 5 MG TABS tablet Take 1 tablet (5 mg total) by mouth 2 (two) times daily. 07/27/22  Yes Camnitz, Will Daphine Deutscher, MD  carvedilol (COREG) 25 MG tablet Take 25 mg by mouth 2 (two) times daily. 11/25/17  Yes [provider]  furosemide (LASIX) 40 MG tablet Take 1 tablet (40 mg total) by mouth daily for 5 days only, then decrease to taking 1 tablet (40 mg total) by mouth every Mon, Wed, and Friday  thereafter. You may take an additional dose as needed on those days for increased swelling/weight gain. Patient taking differently: Take 40 mg by mouth daily as needed for edema. 11/05/21  Yes Meriam Sprague, MD  aspirin EC 81 MG tablet Take 1 tablet (81 mg total) by mouth daily. Swallow whole. Patient not taking: Reported on 12/13/2022 01/14/21   Burnadette Pop, MD    Past Medical History: Past Medical History:  Diagnosis Date   AICD (automatic cardioverter/defibrillator) present    Anxiety    pt denies this hx on 05/12/2016   CAD (coronary artery disease)    last cath 2016   Daily headache    DVT (deep venous thrombosis) (HCC) 1996   High cholesterol    History of blood transfusion    "related to pregnancy" (05/12/2016)   Hypertension    "just started taking RX" (05/12/2016)   Ischemic cardiomyopathy    Left bundle branch block    Mitral valve regurgitation    Myocardial infarction (HCC) ~ 2009 X2   Obesity    Persistent atrial fibrillation (HCC)    Pulmonary embolism (HCC) 1993   Seizures (HCC) 1970s   "I had 1; don't know what it was from" (05/12/2016)   Stroke [facility]) 2001   denies residual on 05/12/2016   Type II diabetes mellitus (HCC)     Past Surgical History: Past Surgical History:  Procedure Laterality Date   BILATERAL CARPAL TUNNEL RELEASE Bilateral 1990s   CARDIAC CATHETERIZATION N/A 09/27/2014   Procedure: Left Heart Cath and Coronary Angiography;  Surgeon: Orpah Cobb, MD;  Location: [facility] INVASIVE CV LAB;  Service: Cardiovascular;  Laterality: N/A;   CARDIAC DEFIBRILLATOR PLACEMENT  07/24/2007   Medtronic Secura DR implanted in [place] for primary prevention   COLONOSCOPY WITH PROPOFOL N/A 04/17/2021   Procedure: COLONOSCOPY WITH PROPOFOL;  Surgeon: Jeani Hawking, MD;  Location: [facility] ENDOSCOPY;  Service: Endoscopy;  Laterality: N/A;   CORONARY ANGIOPLASTY WITH STENT PLACEMENT  ~ 2009   CORONARY ARTERY BYPASS GRAFT  ~ 2009   DILATION AND CURETTAGE OF UTERUS     EP  IMPLANTABLE DEVICE N/A 07/01/2015   BiV ICD upgrade to Medtronic Viva Quad XT CRT-D device for primary prevention   EXTERNAL EAR SURGERY Right 1990   "skin grafted from my right thigh and put on my right ear"   HERNIA REPAIR  1990's    "stomach"   POLYPECTOMY  04/17/2021   Procedure: POLYPECTOMY;  Surgeon: Jeani Hawking, MD;  Location: [facility] ENDOSCOPY;  Service: Endoscopy;;   TUBAL LIGATION     VENA CAVA FILTER PLACEMENT  1993    Family History: Family History  Problem Relation Age of Onset   Hypertension Unknown  Social History: Social History   Socioeconomic History   Marital status: Married    Spouse name: Not on file   Number of children: Not on file   Years of education: Not on file   Highest education level: Not on file  Occupational History   Occupation: disabled  Tobacco Use   Smoking status: Former    Current packs/day: 0.00    Types: Cigarettes    Start date: 01/05/1984    Quit date: 01/05/1988    Years since quitting: 34.9   Smokeless tobacco: Never   Tobacco comments:    05/12/2016 "someday smoker when I did smoked; never smoked much"  Vaping Use   Vaping status: Never Used  Substance and Sexual Activity   Alcohol use: No   Drug use: No   Sexual activity: Not Currently  Other Topics Concern   Not on file  Social History Narrative   Pt lives in [place] with spouse.   Disabled   Social Determinants of Health   Financial Resource Strain: Not on file  Food Insecurity: No Food Insecurity (12/14/2022)   Hunger Vital Sign    Worried About Running Out of Food in the Last Year: Never true    Ran Out of Food in the Last Year: Never true  Transportation Needs: No Transportation Needs (12/14/2022)   PRAPARE - Administrator, Civil Service (Medical): No    Lack of Transportation (Non-Medical): No  Physical Activity: Not on file  Stress: Not on file  Social Connections: Not on file    Allergies:  Allergies  Allergen Reactions   Plavix  [Clopidogrel Bisulfate] Rash    Objective:    Vital Signs:   Temp:  [97.5 F (36.4 C)-98 F (36.7 C)] 97.8 F (36.6 C) (12/10 1126) Pulse Rate:  [43-142] 90 (12/10 1126) Resp:  [14-31] 19 (12/10 1126) BP: (116-144)/(80-112) 121/81 (12/10 1126) SpO2:  [79 %-100 %] 98 % (12/10 1126) Weight:  [93 kg] 93 kg (12/09 1356)    Weight change: Filed Weights   12/13/22 1356  Weight: 93 kg    Intake/Output:   Intake/Output Summary (Last 24 hours) at 12/14/2022 1229 Last data filed at 12/14/2022 0600 Gross per 24 hour  Intake --  Output 1375 ml  Net -1375 ml      Physical Exam    General:  elderly, moderately obese F. No resp difficulty HEENT: normal Neck: supple. JVP 12 cm . Carotids 2+ bilat; no bruits. No lymphadenopathy or thyromegaly appreciated. Cor: PMI nondisplaced. Irregularly irregular rhythm and rate. 2/6 MR murmur  Lungs: decreased BS at the bases bilaterally  Abdomen: soft, nontender, nondistended. No hepatosplenomegaly. No bruits or masses. Good bowel sounds. Extremities: no cyanosis, clubbing, rash, 2+ b/l LE edema Neuro: alert & orientedx3, cranial nerves grossly intact. moves all 4 extremities w/o difficulty. Affect pleasant   Telemetry   Atrial flutter 120s   EKG    Atrial flutter 129 bpm, personally reviewed   Labs   Basic Metabolic Panel: Recent Labs  Lab 12/13/22 1420 12/14/22 0301  NA 138 138  K 4.2 3.8  CL 107 104  CO2 22 25  GLUCOSE 150* 134*  BUN 36* 33*  CREATININE 1.95* 1.51*  CALCIUM 10.2 10.3  MG  --  1.8  PHOS  --  3.3    Liver Function Tests: No results for input(s): "AST", "ALT", "ALKPHOS", "BILITOT", "PROT", "ALBUMIN" in the last 168 hours. No results for input(s): "LIPASE", "AMYLASE" in the last 168 hours. No  results for input(s): "AMMONIA" in the last 168 hours.  CBC: Recent Labs  Lab 12/13/22 1420 12/14/22 0301  WBC 5.8 5.4  HGB 12.6 11.8*  HCT 39.8 37.0  MCV 89.2 88.7  PLT 222 213    Cardiac Enzymes: No  results for input(s): "CKTOTAL", "CKMB", "CKMBINDEX", "TROPONINI" in the last 168 hours.  BNP: BNP (last 3 results) Recent Labs    12/13/22 1420  BNP 1,062.6*    ProBNP (last 3 results) No results for input(s): "PROBNP" in the last 8760 hours.   CBG: Recent Labs  Lab 12/14/22 0716  GLUCAP 123*    Coagulation Studies: Recent Labs    12/13/22 2046/01/08  LABPROT 15.2  INR 1.2     Imaging   DG Chest Port 1 View  Result Date: 12/13/2022 CLINICAL DATA:  Short of breath EXAM: PORTABLE CHEST 1 VIEW COMPARISON:  10/11/2020 FINDINGS: Single frontal view of the chest demonstrates stable enlargement the cardiac silhouette allowing for differences in technique and positioning. Multi lead pacer/AICD again noted. Small right pleural effusion, with fluid again seen in the minor fissure. No airspace disease or pneumothorax. No acute bony abnormalities. IMPRESSION: 1. Small right pleural effusion, with persistent fluid in the minor fissure. 2. Stable enlarged cardiac silhouette. Electronically Signed   By: Sharlet Salina M.D.   On: 12/13/2022 15:50     Medications:     Current Medications:  aspirin EC  81 mg Oral Daily   atorvastatin  40 mg Oral Daily   furosemide  40 mg Intravenous BID   insulin aspart  0-6 Units Subcutaneous TID WC   sodium chloride flush  3 mL Intravenous Q12H    Infusions:  amiodarone 30 mg/hr (12/14/22 1021)   heparin 1,150 Units/hr (12/14/22 1031)      Patient Profile   72 y/o female w/ h/o chronic systolic heart failure dating back to 01-09-2008 due to ICM, CAD w/ remote inferior infarct w/ stenting to the RCA and subsequent CTO of RCA on repeat cath in 01-09-2015, s/p ICD w/ upgrade to CRT-D for LBBB, Afib/flutter, MV disease s/p remote MV annuloplasty in 1995-01-09, DVT/PE s/p IVC filter, on Eliquis, CVA, HTN, HLD, Type 2DM, CKD IIIa and b/l carpal tunnel s/p b/l CTR, admitted for a/c CHF in the setting of atrial flutter w/ RVR.    Assessment/Plan   1. Acute on Chronic  Systolic Heart Failure - Long standing since 01-09-08. Ischemic CM. Also w/ LBBB s/p CRT-D  - Last echo 11/23 EF 30-35%, global HK, mild LVH, RV mildly reduced - admitted w/ NYHA Class IIIb symptoms and volume overload, in setting of dietary and medication noncompliance. This is further c/b rapid AFL. Volume up on exam  - diurese w/ IV Lasix, 40 mg bid  - place PICC to follow CVPs and check Co-ox  - place TED hoses  - will need TEE/DCCV once fully diuresed  - repeat echo  - on very little GDMT PTA, only Coreg 25 mg bid per med rec. Had previously been on [place], Entresto and digoxin. Not clear when/why meds were stopped. - hold coreg for now given risk for low output - hold restarting Entresto and digoxin until AKI further improves (likely tomorrow)  - w/ h/o b/l carpal tunnel, consider w/u for TTR amyloid w/ PYP scan. Would need to check w/ EP to see if device is compaitable for cMRI      2. AFL w/ RVR - h/o Afib/Flutter, on Eliquis - in AFL x 4 days per device  interrogation. Suspect CHF triggered arrhythmia as HF symptoms present x 2 wks - start amio gtt for rate control - continue Eliquis - plan TEE/DCCV after diuresis (missed doses of Eliquis in last month)  - recommend out patient sleep study   3. AKI on CKD IIIa - B/l SCr 1.1 - Admit SCr 1.95 - improved today at 1.5 - monitor BP w/ diuresis. Place PICC to check Co-ox   4. H/o PE/DVT - s/p IVC filter. Also on Eliquis but poor compliance  - D-dimer elevated, 6.05 - primary team has ordered VQ scan   5. LBBB - s/p CRT-D - only 90% Bi-V pacing, hopefully will improve once back in NSR   6. CAD - remote inferior infarct, BMS prox and mid RCA  - known CTO/ISR of RCA by cath 2016, treated medically  - denies CP. HS trop not c/w ACS - continue medical management   7. Mitral Valve Disorder - s/p MV annuloplasty in 1996 - stable on last echo 11/23 w/ mild MS, mG 4 mmHg - repeat echo pending   8. Suspected OSA - h/o snoring   - recommend out patient sleep study    Length of Stay: 1  Robbie Lis, PA-C  12/14/2022, 12:29 PM  Advanced Heart Failure Team Pager [phone] (M-F; 7a - 5p)  Please contact [facility] for night-coverage after hours (4p -7a ) and weekends on [email or web address]  Patient seen and examined with the above-signed Advanced Practice Provider and/or Housestaff. I personally reviewed laboratory data, imaging studies and relevant notes. I independently examined the patient and formulated the important aspects of the plan. I have edited the note to reflect any of my changes or salient points. I have personally discussed the plan with the patient and/or family.  72 y/o woman as above with PAF, systolic HF due to reported iCM. EF 30-35%   Presents with ADHF in setting of recurrent AF.   On device interrogation looks like she has been in AF for just a few days with reduction in BiV pacing.  Has been very sedentary with < 1hr/day activity for well over a year  Volume overloaded on exam. Renal function stable   General:  Elderly weak appearing. No resp difficulty HEENT: normal Neck: supple. JVP to ear . Carotids 2+ bilat; no bruits. No lymphadenopathy or thryomegaly appreciated. Cor: Irregular rate & rhythm. No rubs, gallops or murmurs. Lungs: clear Abdomen: soft, nontender, nondistended. No hepatosplenomegaly. No bruits or masses. Good bowel sounds. Extremities: no cyanosis, clubbing, rash, 1-2+ edema Neuro: alert & orientedx3, cranial nerves grossly intact. moves all 4 extremities w/o difficulty. Affect pleasant  Start IV amio. Continue Eliquis.   Place PICC line to measure CVP and coox. Start diuresis.   Defer TEE/DC-CV until more tuned up.   Arvilla Meres, MD  2:26 PM

## 2022-12-14 NOTE — Progress Notes (Signed)
MEWS Progress Note  Patient Details Name: NAVAEH LINCH MRN: [id number] DOB: [birth year] Today's Date: 12/14/2022   MEWS Flowsheet Documentation:  Assess: MEWS Score Temp: 97.8 F (36.6 C) BP: 114/89 MAP (mmHg): 97 Pulse Rate: (!) 118 ECG Heart Rate: (!) 119 Resp: (!) 29 Level of Consciousness: Alert SpO2: 100 % O2 Device: Room Air Assess: MEWS Score MEWS Temp: 0 MEWS Systolic: 0 MEWS Pulse: 2 MEWS RR: 2 MEWS LOC: 0 MEWS Score: 4 MEWS Score Color: Red Assess: SIRS CRITERIA SIRS Temperature : 0 SIRS Respirations : 1 SIRS Pulse: 1 SIRS WBC: 0 SIRS Score Sum : 2 SIRS Temperature : 0 SIRS Pulse: 1 SIRS Respirations : 1 SIRS WBC: 0 SIRS Score Sum : 2 Assess: if the MEWS score is Yellow or Red Were vital signs accurate and taken at a resting state?: Yes Does the patient meet 2 or more of the SIRS criteria?: No        Cathren Laine 12/14/2022, 7:09 PM

## 2022-12-14 NOTE — Progress Notes (Signed)
Transition of Care [facility]) - Emergency Department Mini Assessment   Patient Details  Name: Shelley Douglas MRN: [id number] Date of Birth: 08-14-1950  Transition of Care [facility]) CM/SW Contact:    Oletta Cohn, RN Phone Number: 12/14/2022, 11:40 AM   Clinical Narrative: Pt from home with spouse.  Has not been taking Rx as prescribed. May need home health RN for medication management   ED Mini Assessment: What brought you to the Emergency Department? : short of breath getting worse  Barriers to Discharge: Continued Medical Work up             Patient Contact and Communications Key Contact 1: (P) spouse, Lemmie      ,          Patient states their goals for this hospitalization and ongoing recovery are:: (P) "get to feeling better"      Admission diagnosis:  Atrial flutter with rapid ventricular response (HCC) [I48.92] Patient Active Problem List   Diagnosis Date Noted   Atrial flutter with rapid ventricular response (HCC) 12/13/2022   Elevated d-dimer 12/13/2022   Chest pain 12/13/2022   Stage 1 acute kidney injury (HCC) 12/13/2022   Cerebral thrombosis with cerebral infarction 01/14/2021   Stroke-like symptoms with dizziness, visual deficits and difficulty speaking  01/13/2021   Type 2 diabetes mellitus without complication (HCC) 01/13/2021   Essential hypertension 01/13/2021   CAD (coronary artery disease)/HLD 01/13/2021   Hypercalcemia 01/13/2021   Acute exacerbation of CHF (congestive heart failure) (HCC) 05/13/2016   Chronic systolic dysfunction of left ventricle 07/01/2015   Dizziness 09/24/2014   Ischemic cardiomyopathy 08/07/2010   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 [place] [address] Phone: [phone] Fax: [phone]

## 2022-12-14 NOTE — Plan of Care (Signed)

## 2022-12-14 NOTE — ED Notes (Signed)
Joya Martyr, MD notified that the patient is now on 3L [place], is AOX4.

## 2022-12-14 NOTE — ED Notes (Signed)
Meal tray ordered 

## 2022-12-14 NOTE — Progress Notes (Signed)
PHARMACY - ANTICOAGULATION CONSULT NOTE  Pharmacy Consult for Heparin Indication: Hx DVT, rule out PE  Allergies  Allergen Reactions   Plavix [Clopidogrel Bisulfate] Rash    Patient Measurements: Height: 5\' 6"  (167.6 cm) Weight: 92.3 kg (203 lb 7.8 oz) IBW/kg (Calculated) : 59.3 Heparin Dosing Weight: 79.8 kg  Vital Signs: Temp: 98 F (36.7 C) (12/10 2001) Temp Source: Oral (12/10 2001) BP: 114/89 (12/10 1900) Pulse Rate: 118 (12/10 1900)  Labs: Recent Labs    12/13/22 1420 12/13/22 1648 12/13/22 2047 12/13/22 2047 12/14/22 0301 12/14/22 0705 12/14/22 0844 12/14/22 1934  HGB 12.6  --   --   --  11.8*  --   --   --   HCT 39.8  --   --   --  37.0  --   --   --   PLT 222  --   --   --  213  --   --   --   APTT  --   --  33   < >  --  166* 126* 99*  LABPROT  --   --  15.2  --   --   --   --   --   INR  --   --  1.2  --   --   --   --   --   HEPARINUNFRC  --   --  0.44  --   --  0.99*  --  0.74*  CREATININE 1.95*  --   --   --  1.51*  --   --   --   TROPONINIHS 32* 36*  --   --  36*  --   --   --    < > = values in this interval not displayed.    Estimated Creatinine Clearance: 38.5 mL/min (A) (by C-G formula based on SCr of 1.51 mg/dL (H)).   Medical History: Past Medical History:  Diagnosis Date   AICD (automatic cardioverter/defibrillator) present    Anxiety    pt denies this hx on 05/12/2016   CAD (coronary artery disease)    last cath 2016   Daily headache    DVT (deep venous thrombosis) (HCC) 1996   High cholesterol    History of blood transfusion    "related to pregnancy" (05/12/2016)   Hypertension    "just started taking RX" (05/12/2016)   Ischemic cardiomyopathy    Left bundle branch block    Mitral valve regurgitation    Myocardial infarction [facility]) ~ 2009 X2   Obesity    Persistent atrial fibrillation (HCC)    Pulmonary embolism (HCC) 1993   Seizures (HCC) 1970s   "I had 1; don't know what it was from" (05/12/2016)   Stroke (HCC) 2001   denies  residual on 05/12/2016   Type II diabetes mellitus (HCC)     Medications:  Scheduled:   aspirin EC  81 mg Oral Daily   atorvastatin  40 mg Oral Daily   Chlorhexidine Gluconate Cloth  6 each Topical Daily   furosemide  40 mg Intravenous BID   insulin aspart  0-6 Units Subcutaneous TID WC   sodium chloride flush  3 mL Intravenous Q12H   Infusions:   amiodarone 30 mg/hr (12/14/22 1849)   heparin 1,150 Units/hr (12/14/22 1849)   PRN:   Assessment: 72 yo female history of PE/DVT on Eliquis presents with [facility] and palpitations. Pharmacy consulted to dose IV heparin for rule out PE. Of note, patient states she  only takes Eliquis 4 times per week.  Both anti-Xa and aPTT elevated this AM, unknown if drawn correctly, repeat aPTT supratherapeutic at 126 seconds however MD instructed RN to hold gtt prior to drawing lab, so likely real level closer to the first result drawn  PM update: aPTT 99 (therapeutic), HL 0.74 (slightly supratherapeutic) - almost correlating on heparin 1150 units/hr. No issues with the infusion or bleeding reported  Goal of Therapy:  Heparin level 0.3-0.7 units/ml aPTT 66-102 seconds Monitor platelets by anticoagulation protocol: Yes   Plan:  Continue heparin drip at 1150 units/hr F/u  aPTT/HL w/ AM labs F/u ability to transition back to Eliquis  Loralee Pacas, PharmD, BCPS Clinical Pharmacist 12/14/2022 8:09 PM  Please check AMION for all [facility] Pharmacy phone numbers After 10:00 PM, call Main Pharmacy [phone]

## 2022-12-14 NOTE — Progress Notes (Signed)
BLE venous duplex has been completed.   Results can be found under chart review under CV PROC. 12/14/2022 4:03 PM Kennetha Pearman RVT, RDMS

## 2022-12-14 NOTE — Progress Notes (Signed)
Echocardiogram 2D Echocardiogram has been performed.  Warren Lacy Znya Albino RDCS 12/14/2022, 10:11 AM

## 2022-12-14 NOTE — ED Notes (Signed)
ED TO INPATIENT HANDOFF REPORT  ED Nurse Name and Phone #: Gayla Medicus Name/Age/Gender Shelley Douglas 72 y.o. female Room/Bed: 007C/007C  Code Status   Code Status: Full Code  Home/SNF/Other Home Patient oriented to: self, place, time, and situation Is this baseline? Yes   Triage Complete: Triage complete  Chief Complaint Atrial flutter with rapid ventricular response [facility]) [I48.92]  Triage Note Pt states she feels like her heart is racing when she does anything. Pt states she also has shortness of breath. These started yesterday. Pt denies pain.    Allergies Allergies  Allergen Reactions   Plavix [Clopidogrel Bisulfate] Rash    Level of Care/Admitting Diagnosis ED Disposition     ED Disposition  Admit   Condition  --   Comment  Hospital [facility]: [facility] [[id number]]  Level of Care: Progressive [102]  Admit to Progressive based on following criteria: CARDIOVASCULAR & THORACIC of moderate stability with acute coronary syndrome symptoms/low risk myocardial infarction/hypertensive urgency/arrhythmias/heart failure potentially compromising stability and stable post cardiovascular intervention patients.  May admit patient to [facility] or [facility] if equivalent level of care is available:: No  Covid Evaluation: Asymptomatic - no recent exposure (last 10 days) testing not required  Diagnosis: Atrial flutter with rapid ventricular response [facility]) [[id number]]  Admitting Physician: Dolly Rias [[id number]]  Attending Physician: Dolly Rias [[id number]]  Certification:: I certify this patient will need inpatient services for at least 2 midnights  Expected Medical Readiness: 12/16/2022          B Medical/Surgery History Past Medical History:  Diagnosis Date   AICD (automatic cardioverter/defibrillator) present    Anxiety    pt denies this hx on 05/12/2016   CAD (coronary artery disease)    last cath 2016   Daily headache    DVT (deep  venous thrombosis) (HCC) 1996   High cholesterol    History of blood transfusion    "related to pregnancy" (05/12/2016)   Hypertension    "just started taking RX" (05/12/2016)   Ischemic cardiomyopathy    Left bundle branch block    Mitral valve regurgitation    Myocardial infarction (HCC) ~ 2009 X2   Obesity    Persistent atrial fibrillation (HCC)    Pulmonary embolism (HCC) 1993   Seizures (HCC) 1970s   "I had 1; don't know what it was from" (05/12/2016)   Stroke [facility]) 2001   denies residual on 05/12/2016   Type II diabetes mellitus (HCC)    Past Surgical History:  Procedure Laterality Date   BILATERAL CARPAL TUNNEL RELEASE Bilateral 1990s   CARDIAC CATHETERIZATION N/A 09/27/2014   Procedure: Left Heart Cath and Coronary Angiography;  Surgeon: Orpah Cobb, MD;  Location: [facility] INVASIVE CV LAB;  Service: Cardiovascular;  Laterality: N/A;   CARDIAC DEFIBRILLATOR PLACEMENT  07/24/2007   Medtronic Secura DR implanted in [place] for primary prevention   COLONOSCOPY WITH PROPOFOL N/A 04/17/2021   Procedure: COLONOSCOPY WITH PROPOFOL;  Surgeon: Jeani Hawking, MD;  Location: [facility] ENDOSCOPY;  Service: Endoscopy;  Laterality: N/A;   CORONARY ANGIOPLASTY WITH STENT PLACEMENT  ~ 2009   CORONARY ARTERY BYPASS GRAFT  ~ 2009   DILATION AND CURETTAGE OF UTERUS     EP IMPLANTABLE DEVICE N/A 07/01/2015   BiV ICD upgrade to Medtronic Viva Quad XT CRT-D device for primary prevention   EXTERNAL EAR SURGERY Right 1990   "skin grafted from my right thigh and put on my right ear"   HERNIA REPAIR  1990's    "  stomach"   POLYPECTOMY  04/17/2021   Procedure: POLYPECTOMY;  Surgeon: Jeani Hawking, MD;  Location: [facility] ENDOSCOPY;  Service: Endoscopy;;   TUBAL LIGATION     VENA CAVA FILTER PLACEMENT  1993     A IV Location/Drains/Wounds Patient Lines/Drains/Airways Status     Active Line/Drains/Airways     Name Placement date Placement time Site Days   Peripheral IV 12/13/22 20 G Left;Posterior Forearm 12/13/22   1422  Forearm  1   Peripheral IV 12/13/22 22 G 1" Left Forearm 12/13/22  2233  Forearm  1   Peripheral IV 12/14/22 22 G Anterior;Right Forearm 12/14/22  0836  Forearm  less than 1            Intake/Output Last 24 hours  Intake/Output Summary (Last 24 hours) at 12/14/2022 1516 Last data filed at 12/14/2022 0600 Gross per 24 hour  Intake --  Output 1375 ml  Net -1375 ml    Labs/Imaging Results for orders placed or performed during the hospital encounter of 12/13/22 (from the past 48 hour(s))  Basic metabolic panel     Status: Abnormal   Collection Time: 12/13/22  2:20 PM  Result Value Ref Range   Sodium 138 135 - 145 mmol/L   Potassium 4.2 3.5 - 5.1 mmol/L   Chloride 107 98 - 111 mmol/L   CO2 22 22 - 32 mmol/L   Glucose, Bld 150 (H) 70 - 99 mg/dL    Comment: Glucose reference range applies only to samples taken after fasting for at least 8 hours.   BUN 36 (H) 8 - 23 mg/dL   Creatinine, Ser 6.21 (H) 0.44 - 1.00 mg/dL   Calcium 30.8 8.9 - 65.7 mg/dL   GFR, Estimated 27 (L) >60 mL/min    Comment: (NOTE) Calculated using the CKD-EPI Creatinine Equation (2021)    Anion gap 9 5 - 15    Comment: Performed at [facility], [address], [place], [place] [address]  CBC     Status: Abnormal   Collection Time: 12/13/22  2:20 PM  Result Value Ref Range   WBC 5.8 4.0 - 10.5 K/uL   RBC 4.46 3.87 - 5.11 MIL/uL   Hemoglobin 12.6 12.0 - 15.0 g/dL   HCT 29.5 28.4 - 13.2 %   MCV 89.2 80.0 - 100.0 fL   MCH 28.3 26.0 - 34.0 pg   MCHC 31.7 30.0 - 36.0 g/dL   RDW 44.0 (H) 10.2 - 72.5 %   Platelets 222 150 - 400 K/uL   nRBC 0.0 0.0 - 0.2 %    Comment: Performed at [facility], [address] [place], [place] [address]  Troponin I (High Sensitivity)     Status: Abnormal   Collection Time: 12/13/22  2:20 PM  Result Value Ref Range   Troponin I (High Sensitivity) 32 (H) <18 ng/L    Comment: (NOTE) Elevated high sensitivity troponin I (hsTnI) values and significant   changes across serial measurements may suggest ACS but many other  chronic and acute conditions are known to elevate hsTnI results.  Refer to the "Links" section for chest pain algorithms and additional  guidance. Performed at [facility], [address] [place], [place] [address]   Brain natriuretic peptide     Status: Abnormal   Collection Time: 12/13/22  2:20 PM  Result Value Ref Range   B Natriuretic Peptide 1,062.6 (H) 0.0 - 100.0 pg/mL    Comment: Performed at [facility],  [address] [place], [place] [address]  D-dimer, quantitative     Status: Abnormal   Collection Time: 12/13/22  2:20 PM  Result Value Ref Range   D-Dimer, Quant 6.05 (H) 0.00 - 0.50 ug/mL-FEU    Comment: (NOTE) At the manufacturer cut-off value of 0.5 g/mL FEU, this assay has a negative predictive value of 95-100%.This assay is intended for use in conjunction with a clinical pretest probability (PTP) assessment model to exclude pulmonary embolism (PE) and deep venous thrombosis (DVT) in outpatients suspected of PE or DVT. Results should be correlated with clinical presentation. Performed at [facility], [address] [place], [place] [address]   Troponin I (High Sensitivity)     Status: Abnormal   Collection Time: 12/13/22  4:48 PM  Result Value Ref Range   Troponin I (High Sensitivity) 36 (H) <18 ng/L    Comment: (NOTE) Elevated high sensitivity troponin I (hsTnI) values and significant  changes across serial measurements may suggest ACS but many other  chronic and acute conditions are known to elevate hsTnI results.  Refer to the "Links" section for chest pain algorithms and additional  guidance. Performed at [facility], [address], [place], [place] [address]   APTT     Status: None   Collection Time: 12/13/22  8:47 PM  Result Value Ref Range   aPTT 33 24 - 36 seconds    Comment: Performed at [facility], [address] [place], [place] [address]   Protime-INR     Status: None   Collection Time: 12/13/22  8:47 PM  Result Value Ref Range   Prothrombin Time 15.2 11.4 - 15.2 seconds   INR 1.2 0.8 - 1.2    Comment: (NOTE) INR goal varies based on device and disease states. Performed at [facility], [address], [place], [place] [address]   Heparin level (unfractionated)     Status: None   Collection Time: 12/13/22  8:47 PM  Result Value Ref Range   Heparin Unfractionated 0.44 0.30 - 0.70 IU/mL    Comment: (NOTE) The clinical reportable range upper limit is being lowered to >1.10 to align with the FDA approved guidance for the current laboratory assay.  If heparin results are below expected values, and patient dosage has  been confirmed, suggest follow up testing of antithrombin III levels. Performed at [facility], [address] [place], [place] [address]   TSH     Status: None   Collection Time: 12/14/22  3:01 AM  Result Value Ref Range   TSH 1.021 0.350 - 4.500 uIU/mL    Comment: Performed by a 3rd Generation assay with a functional sensitivity of <=0.01 uIU/mL. Performed at [facility], [address], [place], [place] [address]   Basic metabolic panel     Status: Abnormal   Collection Time: 12/14/22  3:01 AM  Result Value Ref Range   Sodium 138 135 - 145 mmol/L   Potassium 3.8 3.5 - 5.1 mmol/L   Chloride 104 98 - 111 mmol/L   CO2 25 22 - 32 mmol/L   Glucose, Bld 134 (H) 70 - 99 mg/dL    Comment: Glucose reference range applies only to samples taken after fasting for at least 8 hours.   BUN 33 (H) 8 - 23 mg/dL   Creatinine, Ser 9.62 (H) 0.44 - 1.00 mg/dL   Calcium 95.2 8.9 - 84.1 mg/dL   GFR, Estimated 37 (L) >60  mL/min    Comment: (NOTE) Calculated using the CKD-EPI Creatinine Equation (2021)    Anion gap 9 5 - 15    Comment: Performed at [facility] Lab, [address] [place], [place] [address]  CBC     Status: Abnormal   Collection Time: 12/14/22  3:01 AM  Result Value Ref  Range   WBC 5.4 4.0 - 10.5 K/uL   RBC 4.17 3.87 - 5.11 MIL/uL   Hemoglobin 11.8 (L) 12.0 - 15.0 g/dL   HCT 40.9 81.1 - 91.4 %   MCV 88.7 80.0 - 100.0 fL   MCH 28.3 26.0 - 34.0 pg   MCHC 31.9 30.0 - 36.0 g/dL   RDW 78.2 (H) 95.6 - 21.3 %   Platelets 213 150 - 400 K/uL   nRBC 0.0 0.0 - 0.2 %    Comment: Performed at [facility] Lab, [address], [place], [place] [address]  Magnesium     Status: None   Collection Time: 12/14/22  3:01 AM  Result Value Ref Range   Magnesium 1.8 1.7 - 2.4 mg/dL    Comment: Performed at [facility], [address], [place], [place] [address]  Phosphorus     Status: None   Collection Time: 12/14/22  3:01 AM  Result Value Ref Range   Phosphorus 3.3 2.5 - 4.6 mg/dL    Comment: Performed at [facility], [address], [place], [place] [address]  Hemoglobin A1c     Status: Abnormal   Collection Time: 12/14/22  3:01 AM  Result Value Ref Range   Hgb A1c MFr Bld 6.7 (H) 4.8 - 5.6 %    Comment: (NOTE) Pre diabetes:          5.7%-6.4%  Diabetes:              >6.4%  Glycemic control for   <7.0% adults with diabetes    Mean Plasma Glucose 145.59 mg/dL    Comment: Performed at [facility], [address] [place], [place] [address]  Lipid panel     Status: Abnormal   Collection Time: 12/14/22  3:01 AM  Result Value Ref Range   Cholesterol 211 (H) 0 - 200 mg/dL   Triglycerides 70 <401 mg/dL   HDL 51 >02 mg/dL   Total CHOL/HDL Ratio 4.1 RATIO   VLDL 14 0 - 40 mg/dL   LDL Cholesterol 725 (H) 0 - 99 mg/dL    Comment:        Total Cholesterol/HDL:CHD Risk Coronary Heart Disease Risk Table                     Men   Women  1/2 Average Risk   3.4   3.3  Average Risk       5.0   4.4  2 X Average Risk   9.6   7.1  3 X Average Risk  23.4   11.0        Use the calculated Patient Ratio above and the CHD Risk Table to determine the patient's CHD Risk.        ATP III CLASSIFICATION (LDL):  <100     mg/dL   Optimal  366-440  mg/dL    Near or Above                    Optimal  130-159  mg/dL   Borderline  347-425  mg/dL   High  >956  mg/dL   Very High Performed at [facility], [address], [place], [place] [address]   Troponin I (High Sensitivity)     Status: Abnormal   Collection Time: 12/14/22  3:01 AM  Result Value Ref Range   Troponin I (High Sensitivity) 36 (H) <18 ng/L    Comment: (NOTE) Elevated high sensitivity troponin I (hsTnI) values and significant  changes across serial measurements may suggest ACS but many other  chronic and acute conditions are known to elevate hsTnI results.  Refer to the "Links" section for chest pain algorithms and additional  guidance. Performed at [facility], [address] [place], [place] [address]   Heparin level (unfractionated)     Status: Abnormal   Collection Time: 12/14/22  7:05 AM  Result Value Ref Range   Heparin Unfractionated 0.99 (H) 0.30 - 0.70 IU/mL    Comment: (NOTE) The clinical reportable range upper limit is being lowered to >1.10 to align with the FDA approved guidance for the current laboratory assay.  If heparin results are below expected values, and patient dosage has  been confirmed, suggest follow up testing of antithrombin III levels. Performed at [facility], [address] [place], [place] [address]   APTT     Status: Abnormal   Collection Time: 12/14/22  7:05 AM  Result Value Ref Range   aPTT 166 (HH) 24 - 36 seconds    Comment:        IF BASELINE aPTT IS ELEVATED, SUGGEST PATIENT RISK ASSESSMENT BE USED TO DETERMINE APPROPRIATE ANTICOAGULANT THERAPY. QUESTIONABLE RESULTS, RECOMMEND RECOLLECT TO VERIFY REPEATED TO VERIFY CRITICAL RESULT CALLED TO, READ BACK BY AND VERIFIED WITH: BRITTANY HONEYCUTT,RN AT 0805 12/14/2022 BY ZBEECH. Performed at [facility], [address], [place], [place] [address]   CBG monitoring, ED     Status: Abnormal   Collection Time: 12/14/22  7:16 AM  Result Value Ref  Range   Glucose-Capillary 123 (H) 70 - 99 mg/dL    Comment: Glucose reference range applies only to samples taken after fasting for at least 8 hours.  APTT     Status: Abnormal   Collection Time: 12/14/22  8:44 AM  Result Value Ref Range   aPTT 126 (H) 24 - 36 seconds    Comment:        IF BASELINE aPTT IS ELEVATED, SUGGEST PATIENT RISK ASSESSMENT BE USED TO DETERMINE APPROPRIATE ANTICOAGULANT THERAPY. RESULTS VERIFIED VIA RECOLLECT Performed at [facility], [address], [place], [place] [address]   CBG monitoring, ED     Status: Abnormal   Collection Time: 12/14/22  1:22 PM  Result Value Ref Range   Glucose-Capillary 127 (H) 70 - 99 mg/dL    Comment: Glucose reference range applies only to samples taken after fasting for at least 8 hours.   [place] EKG SITE RITE  Result Date: 12/14/2022 If Site Rite image not attached, placement could not be confirmed due to current cardiac rhythm.  ECHOCARDIOGRAM COMPLETE  Result Date: 12/14/2022    ECHOCARDIOGRAM REPORT   Patient Name:   ANALLELI COVEN Date of Exam: 12/14/2022 Medical Rec #:  [id number]          Height:       66.0 in Accession #:    [id number]         Weight:       205.0 lb Date of Birth:  Dec 09, 1950  BSA:          2.021 m Patient Age:    72 years           BP:           128/81 mmHg Patient Gender: F                  HR:           73 bpm. Exam Location:  Inpatient Procedure: 2D Echo, Color Doppler and Cardiac Doppler Indications:    I50.9* Heart failure (unspecified)  History:        Patient has prior history of Echocardiogram examinations, most                 recent 11/17/2021. CHF, Pacemaker and Defibrillator,                 Arrythmias:Atrial Flutter and Atrial Fibrillation; Risk                 Factors:Hypertension and Diabetes. MV Annuloplasty prior to                 2013.                  Mitral Valve: prosthetic annuloplasty ring valve is present in                 the mitral position. Procedure Date: prior to  2013.  Sonographer:    Irving Burton Senior RDCS Referring Phys: [id number] Dolly Rias IMPRESSIONS  1. Left ventricular ejection fraction, by estimation, is 20 to 25%. Left ventricular ejection fraction by 2D MOD biplane is 22.3 %. The left ventricle has severely decreased function. The left ventricle demonstrates global hypokinesis. The left ventricular internal cavity size was severely dilated. There is mild concentric left ventricular hypertrophy. Left ventricular diastolic function could not be evaluated.  2. Right ventricular systolic function is severely reduced. The right ventricular size is moderately enlarged. There is moderately elevated pulmonary artery systolic pressure. The estimated right ventricular systolic pressure is 48.9 mmHg.  3. Left atrial size was severely dilated.  4. Right atrial size was severely dilated.  5. The mitral valve has been repaired/replaced. Moderate to severe mitral valve regurgitation. Moderate mitral stenosis. The mean mitral valve gradient is 6.7 mmHg with average heart rate of 68 bpm. There is a prosthetic annuloplasty ring present in the  mitral position. Procedure Date: prior to 2013.  6. The tricuspid valve is abnormal. Tricuspid valve regurgitation is moderate to severe.  7. The aortic valve has an indeterminant number of cusps. Aortic valve regurgitation is not visualized. No aortic stenosis is present.  8. The inferior vena cava is dilated in size with <50% respiratory variability, suggesting right atrial pressure of 15 mmHg. Comparison(s): Prior images reviewed side by side. The left ventricular function is worsened. The right ventricular systolic function is worse. Mitral and tricuspid insufficiency ar worse. Right atrail pressure and pulmonary artery pressure are higher. FINDINGS  Left Ventricle: Left ventricular ejection fraction, by estimation, is 20 to 25%. Left ventricular ejection fraction by 2D MOD biplane is 22.3 %. The left ventricle has severely decreased  function. The left ventricle demonstrates global hypokinesis. The left ventricular internal cavity size was severely dilated. There is mild concentric left ventricular hypertrophy. Abnormal (paradoxical) septal motion consistent with post-operative status. Left ventricular diastolic function could not be evaluated due to mitral valve repair. Left ventricular diastolic function could not be evaluated. Right Ventricle: The right ventricular  size is moderately enlarged. No increase in right ventricular wall thickness. Right ventricular systolic function is severely reduced. There is moderately elevated pulmonary artery systolic pressure. The tricuspid regurgitant velocity is 2.91 m/s, and with an assumed right atrial pressure of 15 mmHg, the estimated right ventricular systolic pressure is 48.9 mmHg. Left Atrium: Left atrial size was severely dilated. Right Atrium: Right atrial size was severely dilated. Pericardium: Trivial pericardial effusion is present. Mitral Valve: The mitral valve has been repaired/replaced. Moderate to severe mitral valve regurgitation. There is a prosthetic annuloplasty ring present in the mitral position. Procedure Date: prior to 2013. Moderate mitral valve stenosis. MV peak gradient, 14.6 mmHg. The mean mitral valve gradient is 6.7 mmHg with average heart rate of 68 bpm. Tricuspid Valve: The tricuspid valve is abnormal. Tricuspid valve regurgitation is moderate to severe. The flow in the hepatic veins is reversed during ventricular systole. Aortic Valve: The aortic valve has an indeterminant number of cusps. Aortic valve regurgitation is not visualized. No aortic stenosis is present. Pulmonic Valve: The pulmonic valve was normal in structure. Pulmonic valve regurgitation is trivial. No evidence of pulmonic stenosis. Aorta: The aortic root and ascending aorta are structurally normal, with no evidence of dilitation. Venous: The inferior vena cava is dilated in size with less than 50%  respiratory variability, suggesting right atrial pressure of 15 mmHg. The inferior vena cava and the hepatic vein show a pattern of systolic flow reversal, suggestive of tricuspid regurgitation. IAS/Shunts: No atrial level shunt detected by color flow Doppler. Additional Comments: A device lead is visualized in the right atrium and right ventricle.  LEFT VENTRICLE PLAX 2D                        Biplane EF (MOD) LVIDd:         4.90 cm         LV Biplane EF:   Left LVIDs:         4.70 cm                          ventricular LV PW:         1.14 cm                          ejection LV IVS:        1.30 cm                          fraction by LVOT diam:     2.00 cm                          2D MOD LV SV:         39                               biplane is LV SV Index:   19                               22.3 %. LVOT Area:     3.14 cm  LV Volumes (MOD) LV vol d, MOD    150.0 ml A2C: LV vol d, MOD    122.5 ml A4C: LV vol s, MOD    114.0  ml A2C: LV vol s, MOD    101.7 ml A4C: LV SV MOD A2C:   36.0 ml LV SV MOD A4C:   122.5 ml LV SV MOD BP:    31.0 ml RIGHT VENTRICLE RV S prime:     8.05 cm/s TAPSE (M-mode): 1.0 cm LEFT ATRIUM             Index        RIGHT ATRIUM           Index LA diam:        4.40 cm 2.18 cm/m   RA Area:     35.75 cm LA Vol (A2C):   77.1 ml 38.15 ml/m  RA Volume:   149.00 ml 73.73 ml/m LA Vol (A4C):   77.2 ml 38.20 ml/m LA Biplane Vol: 78.3 ml 38.75 ml/m  AORTIC VALVE LVOT Vmax:   78.97 cm/s LVOT Vmean:  55.867 cm/s LVOT VTI:    0.123 m  AORTA Ao Root diam: 3.00 cm Ao Asc diam:  3.10 cm MITRAL VALVE                  TRICUSPID VALVE MV Area VTI:  1.01 cm        TR Peak grad:   33.9 mmHg MV Peak grad: 14.6 mmHg       TR Vmax:        291.00 cm/s MV Mean grad: 6.7 mmHg MV Vmax:      1.91 m/s        SHUNTS MV Vmean:     112.0 cm/s      Systemic VTI:  0.12 m MR Peak grad:    62.1 mmHg    Systemic Diam: 2.00 cm MR Mean grad:    45.7 mmHg MR Vmax:         394.00 cm/s MR Vmean:        292.7 cm/s MR PISA:          2.26 cm MR PISA Eff ROA: 22 mm MR PISA Radius:  0.60 cm Rachelle Hora Croitoru MD Electronically signed by Thurmon Fair MD Signature Date/Time: 12/14/2022/1:41:27 PM    Final    DG Chest Port 1 View  Result Date: 12/13/2022 CLINICAL DATA:  Short of breath EXAM: PORTABLE CHEST 1 VIEW COMPARISON:  10/11/2020 FINDINGS: Single frontal view of the chest demonstrates stable enlargement the cardiac silhouette allowing for differences in technique and positioning. Multi lead pacer/AICD again noted. Small right pleural effusion, with fluid again seen in the minor fissure. No airspace disease or pneumothorax. No acute bony abnormalities. IMPRESSION: 1. Small right pleural effusion, with persistent fluid in the minor fissure. 2. Stable enlarged cardiac silhouette. Electronically Signed   By: Sharlet Salina M.D.   On: 12/13/2022 15:50    Pending Labs Unresulted Labs (From admission, onward)     Start     Ordered   12/15/22 0500  Heparin level (unfractionated)  Daily,   R      12/13/22 2127   12/15/22 0500  APTT  Daily,   R      12/13/22 2127   12/15/22 0500  CBC  Tomorrow morning,   R        12/14/22 1012   12/15/22 0500  Basic metabolic panel  Tomorrow morning,   R        12/14/22 1012   12/14/22 1900  Heparin level (unfractionated)  Once-Timed,   TIMED       Placed in "And" Linked Group  12/14/22 1014   12/14/22 1900  APTT  Once-Timed,   TIMED       Placed in "And" Linked Group   12/14/22 1014   12/14/22 1351  Cooxemetry Panel (carboxy, met, total hgb, O2 sat)  Daily,   R     Comments: Wait to draw off PICC line    12/14/22 1349   12/14/22 1100  Lactic acid, plasma  (Lactic Acid)  Once,   R        12/14/22 1059            Vitals/Pain Today's Vitals   12/14/22 1021 12/14/22 1023 12/14/22 1026 12/14/22 1126  BP:  (!) 144/81 (!) 144/81 121/81  Pulse: 86 (!) 56 100 90  Resp: (!) 24 19  19   Temp:    97.8 F (36.6 C)  TempSrc:    Oral  SpO2: 96% 97%  98%  Weight:      Height:       PainSc:        Isolation Precautions No active isolations  Medications Medications  amiodarone (NEXTERONE) 1.8 mg/mL load via infusion 150 mg (150 mg Intravenous Bolus from Bag 12/13/22 2048)    Followed by  amiodarone (NEXTERONE PREMIX) 360-4.14 MG/200ML-% (1.8 mg/mL) IV infusion (0 mg/hr Intravenous Stopped 12/14/22 0154)    Followed by  amiodarone (NEXTERONE PREMIX) 360-4.14 MG/200ML-% (1.8 mg/mL) IV infusion (30 mg/hr Intravenous Rate/Dose Verify 12/14/22 1021)  heparin ADULT infusion 100 units/mL (25000 units/247mL) (1,150 Units/hr Intravenous Rate/Dose Change 12/14/22 1031)  sodium chloride flush (NS) 0.9 % injection 3 mL (3 mLs Intravenous Not Given 12/14/22 1031)  acetaminophen (TYLENOL) tablet 1,000 mg (has no administration in time range)  albuterol (PROVENTIL) (2.5 MG/3ML) 0.083% nebulizer solution 2.5 mg (has no administration in time range)  aspirin EC tablet 81 mg (81 mg Oral Given 12/14/22 1021)  atorvastatin (LIPITOR) tablet 40 mg (40 mg Oral Given 12/14/22 1021)  insulin aspart (novoLOG) injection 0-6 Units ( Subcutaneous Not Given 12/14/22 1337)  furosemide (LASIX) injection 40 mg (has no administration in time range)  furosemide (LASIX) injection 60 mg (60 mg Intravenous Given 12/13/22 2057)  heparin bolus via infusion 4,000 Units (4,000 Units Intravenous Bolus from Bag 12/13/22 2240)  magnesium sulfate IVPB 2 g 50 mL (0 g Intravenous Stopped 12/14/22 1131)  technetium albumin aggregated (MAA) injection solution 4.4 millicurie (4.4 millicuries Intravenous Contrast Given 12/14/22 1315)    Mobility walks     Focused Assessments Cardiac Assessment Handoff:  Cardiac Rhythm: Atrial flutter Lab Results  Component Value Date   TROPONINI <0.03 11/08/2016   Lab Results  Component Value Date   DDIMER 6.05 (H) 12/13/2022   Does the Patient currently have chest pain? No    R Recommendations: See Admitting Provider Note  Report given to:   Additional Notes: Pt  is AO, her O2 dropped earlier, placed on 3L [place], Preetha, MD notified, has not had any complaints with me, actually says she feels better .

## 2022-12-14 NOTE — ED Notes (Signed)
Pt transported to vascular for procedure

## 2022-12-14 NOTE — ED Notes (Addendum)
Shared PTT 166 with Dr. Jomarie Longs. Verbal orders to hold heparin gtt and redraw PTT to confirm

## 2022-12-14 NOTE — Progress Notes (Signed)
PROGRESS NOTE    Shelley Douglas  ZOX:[id number] DOB: 10/25/1950 DOA: 12/13/2022 PCP: Rometta Emery, MD  72/F w chronic systolic CHF with CRT-D, hx VT, Afib, CAD with PCI, RCA stent, MV annuloplasty, DVT/PE, on AC, CVA, HTN, HLD, DM, who presents with few weeks of worsening dyspnea on exertion and exertional chest pain followed by 2 days of rapid HR / palpitations. -Noncompliant with all cardiac meds except Coreg takes Eliquis few days a week.  In the ED she was noted to be in rapid atrial flutter, labs with creatinine 1.9, BNP 1062, the 2 part troponin, 32, 36 -Admitted, seen by cardiology, started on Amio gtt.  Subjective: -Feels a little better  Assessment and Plan:  A flutter with RVR -Cards consulting, poor compliance with amiodarone and Eliquis prior to admission -Now on Amio gtt., IV heparin, restart Coreg -Follow-up pacemaker interrogation -Defer to cards regarding timing for TEE/DCCV  Acute on chronic systolic CHF, CRT/D in place -Last echo 11/23 with EF 30-35%, global hypokinesis, indeterminate diastolic function, mildly reduced RV,sp MV annuloplasty,  -Poor compliance with heart failure meds  -She is mildly volume overloaded, likely also triggered by rapid a flutter -continue IV Lasix 1 more day  -Add Jardiance, Aldactone tomorrow if kidney function stays stable  -Follow-up repeat echo    Elevated D-dimer of 6.05 History of prior DVT PE Intermittent nonadherence with Eliquis.   -VQ scan, Dopplers ordered by admitting MD will follow-up, continue empiric IV heparin this morning, follow-up 2D echo  Mildly elevated troponin, suspect demand ischemia from above, History of CAD with PCI, RCA stent -Continue aspirin, statin, restart Coreg   AKI stage I, background CKD stage II Baseline creatinine 1.1-2.  Elevated to 1.95 on admission.  Suspect cardiorenal. -Improving holding Entresto, continue diuretics   History of VT, ICD in place: Resuming on amiodarone per above,  pacer interrogation pending.  CVA: Continue aspirin, AC, statin   DM: Not taking metformin at home.  SSI while inpatient   Class I obesity Body mass index is 33.09 kg/m.  Obesity class I affecting medical care per above   DVT prophylaxis:  IV heparin gtts Code Status:  Full Code Family Communication: No family at bedside Disposition Plan:   Consultants:    Procedures:   Antimicrobials:    Objective: Vitals:   12/13/22 2315 12/14/22 0120 12/14/22 0500 12/14/22 0713  BP: (!) 125/112 120/80 (!) 124/96 128/81  Pulse: (!) 142 (!) 125 (!) 43 94  Resp: 20 (!) 30 15 19   Temp:   97.6 F (36.4 C) 97.6 F (36.4 C)  TempSrc:    Oral  SpO2: 100% (!) 79% 100% 97%  Weight:      Height:        Intake/Output Summary (Last 24 hours) at 12/14/2022 1001 Last data filed at 12/14/2022 0600 Gross per 24 hour  Intake --  Output 1375 ml  Net -1375 ml   Filed Weights   12/13/22 1356  Weight: 93 kg    Examination:  General exam: Obese pleasant female sitting up in bed, AAOx3 HEENT: Positive JVD CVS: S1-S2, irregular rhythm Lungs: Decreased breath sounds the bases otherwise clear Abdomen: Soft, nontender, bowel sounds present Extremities: Trace edema Skin: No rashes Psychiatry:  Mood & affect appropriate.     Data Reviewed:   CBC: Recent Labs  Lab 12/13/22 1420 12/14/22 0301  WBC 5.8 5.4  HGB 12.6 11.8*  HCT 39.8 37.0  MCV 89.2 88.7  PLT 222 213   Basic Metabolic  Panel: Recent Labs  Lab 12/13/22 1420 12/14/22 0301  NA 138 138  K 4.2 3.8  CL 107 104  CO2 22 25  GLUCOSE 150* 134*  BUN 36* 33*  CREATININE 1.95* 1.51*  CALCIUM 10.2 10.3  MG  --  1.8  PHOS  --  3.3   GFR: Estimated Creatinine Clearance: 38.7 mL/min (A) (by C-G formula based on SCr of 1.51 mg/dL (H)). Liver Function Tests: No results for input(s): "AST", "ALT", "ALKPHOS", "BILITOT", "PROT", "ALBUMIN" in the last 168 hours. No results for input(s): "LIPASE", "AMYLASE" in the last 168  hours. No results for input(s): "AMMONIA" in the last 168 hours. Coagulation Profile: Recent Labs  Lab 12/13/22 2047  INR 1.2   Cardiac Enzymes: No results for input(s): "CKTOTAL", "CKMB", "CKMBINDEX", "TROPONINI" in the last 168 hours. BNP (last 3 results) No results for input(s): "PROBNP" in the last 8760 hours. HbA1C: Recent Labs    12/14/22 0301  HGBA1C 6.7*   CBG: Recent Labs  Lab 12/14/22 0716  GLUCAP 123*   Lipid Profile: Recent Labs    12/14/22 0301  CHOL 211*  HDL 51  LDLCALC 146*  TRIG 70  CHOLHDL 4.1   Thyroid Function Tests: Recent Labs    12/14/22 0301  TSH 1.021   Anemia Panel: No results for input(s): "VITAMINB12", "FOLATE", "FERRITIN", "TIBC", "IRON", "RETICCTPCT" in the last 72 hours. Urine analysis:    Component Value Date/Time   COLORURINE YELLOW 01/09/2021 1226   APPEARANCEUR CLEAR 01/09/2021 1226   LABSPEC >1.030 (H) 01/09/2021 1226   PHURINE 6.0 01/09/2021 1226   GLUCOSEU NEGATIVE 01/09/2021 1226   HGBUR NEGATIVE 01/09/2021 1226   BILIRUBINUR NEGATIVE 01/09/2021 1226   KETONESUR NEGATIVE 01/09/2021 1226   PROTEINUR NEGATIVE 01/09/2021 1226   UROBILINOGEN 0.2 10/17/2009 2007   NITRITE NEGATIVE 01/09/2021 1226   LEUKOCYTESUR NEGATIVE 01/09/2021 1226   Sepsis Labs: @LABRCNTIP (procalcitonin:4,lacticidven:4)  )No results found for this or any previous visit (from the past 240 hour(s)).   Radiology Studies: DG Chest Port 1 View  Result Date: 12/13/2022 CLINICAL DATA:  Short of breath EXAM: PORTABLE CHEST 1 VIEW COMPARISON:  10/11/2020 FINDINGS: Single frontal view of the chest demonstrates stable enlargement the cardiac silhouette allowing for differences in technique and positioning. Multi lead pacer/AICD again noted. Small right pleural effusion, with fluid again seen in the minor fissure. No airspace disease or pneumothorax. No acute bony abnormalities. IMPRESSION: 1. Small right pleural effusion, with persistent fluid in the minor  fissure. 2. Stable enlarged cardiac silhouette. Electronically Signed   By: Sharlet Salina M.D.   On: 12/13/2022 15:50     Scheduled Meds:  aspirin EC  81 mg Oral Daily   atorvastatin  40 mg Oral Daily   insulin aspart  0-6 Units Subcutaneous TID WC   sodium chloride flush  3 mL Intravenous Q12H   Continuous Infusions:  amiodarone 30 mg/hr (12/14/22 0157)   heparin Stopped (12/14/22 0816)   magnesium sulfate bolus IVPB       LOS: 1 day    Time spent:    Zannie Cove, MD [facility]   12/14/2022, 10:01 AM

## 2022-12-14 NOTE — Progress Notes (Signed)
Heart Failure Navigator Progress Note  Assessed for Heart & Vascular TOC clinic readiness.  Patient does not meet criteria due to Advanced Heart Failure Team consulted. .   Navigator will sign off at this time.   Dawn Fields, BSN, RN Heart Failure Nurse Navigator Secure Chat Only   

## 2022-12-15 DIAGNOSIS — N179 Acute kidney failure, unspecified: Secondary | ICD-10-CM

## 2022-12-15 DIAGNOSIS — I10 Essential (primary) hypertension: Secondary | ICD-10-CM | POA: Diagnosis not present

## 2022-12-15 DIAGNOSIS — I5023 Acute on chronic systolic (congestive) heart failure: Secondary | ICD-10-CM | POA: Diagnosis not present

## 2022-12-15 DIAGNOSIS — I4892 Unspecified atrial flutter: Secondary | ICD-10-CM | POA: Diagnosis not present

## 2022-12-15 DIAGNOSIS — E785 Hyperlipidemia, unspecified: Secondary | ICD-10-CM

## 2022-12-15 DIAGNOSIS — N189 Chronic kidney disease, unspecified: Secondary | ICD-10-CM

## 2022-12-15 DIAGNOSIS — E1169 Type 2 diabetes mellitus with other specified complication: Secondary | ICD-10-CM

## 2022-12-15 DIAGNOSIS — I6330 Cerebral infarction due to thrombosis of unspecified cerebral artery: Secondary | ICD-10-CM

## 2022-12-15 LAB — CBC
HCT: 35.7 % — ABNORMAL LOW (ref 36.0–46.0)
Hemoglobin: 11.3 g/dL — ABNORMAL LOW (ref 12.0–15.0)
MCH: 28.2 pg (ref 26.0–34.0)
MCHC: 31.7 g/dL (ref 30.0–36.0)
MCV: 89 fL (ref 80.0–100.0)
Platelets: 203 10*3/uL (ref 150–400)
RBC: 4.01 MIL/uL (ref 3.87–5.11)
RDW: 16.5 % — ABNORMAL HIGH (ref 11.5–15.5)
WBC: 4.5 10*3/uL (ref 4.0–10.5)
nRBC: 0 % (ref 0.0–0.2)

## 2022-12-15 LAB — BASIC METABOLIC PANEL
Anion gap: 6 (ref 5–15)
BUN: 26 mg/dL — ABNORMAL HIGH (ref 8–23)
CO2: 24 mmol/L (ref 22–32)
Calcium: 9.7 mg/dL (ref 8.9–10.3)
Chloride: 103 mmol/L (ref 98–111)
Creatinine, Ser: 1.53 mg/dL — ABNORMAL HIGH (ref 0.44–1.00)
GFR, Estimated: 36 mL/min — ABNORMAL LOW (ref >60)
Glucose, Bld: 144 mg/dL — ABNORMAL HIGH (ref 70–99)
Potassium: 4.9 mmol/L (ref 3.5–5.1)
Sodium: 133 mmol/L — ABNORMAL LOW (ref 135–145)

## 2022-12-15 LAB — COOXEMETRY PANEL
Carboxyhemoglobin: 1.1 % (ref 0.5–1.5)
Methemoglobin: <0.7 % (ref 0.0–1.5)
O2 Saturation: 61.2 %
Total hemoglobin: 11.3 g/dL — ABNORMAL LOW (ref 12.0–16.0)

## 2022-12-15 LAB — GLUCOSE, CAPILLARY
Glucose-Capillary: 137 mg/dL — ABNORMAL HIGH (ref 70–99)
Glucose-Capillary: 177 mg/dL — ABNORMAL HIGH (ref 70–99)
Glucose-Capillary: 136 mg/dL — ABNORMAL HIGH (ref 70–99)
Glucose-Capillary: 153 mg/dL — ABNORMAL HIGH (ref 70–99)

## 2022-12-15 LAB — APTT: aPTT: 115 s — ABNORMAL HIGH (ref 24–36)

## 2022-12-15 LAB — HEPARIN LEVEL (UNFRACTIONATED): Heparin Unfractionated: 0.51 [IU]/mL (ref 0.30–0.70)

## 2022-12-15 MED ORDER — SODIUM CHLORIDE 0.9% FLUSH: 10.0000 mL | INTRAVENOUS | Status: DC | PRN

## 2022-12-15 MED ORDER — FUROSEMIDE 10 MG/ML IJ SOLN
40.0000 mg | Freq: Once | INTRAMUSCULAR | Status: AC
  Administered 2022-12-15: 40 mg via INTRAVENOUS

## 2022-12-15 MED ORDER — APIXABAN 5 MG PO TABS
5.0000 mg | ORAL_TABLET | Freq: Two times a day (BID) | ORAL | Status: DC
  Administered 2022-12-15 – 2022-12-20 (×11): 5 mg via ORAL
  Filled 2022-12-15 (×10): qty 1

## 2022-12-15 MED ORDER — SODIUM CHLORIDE 0.9% FLUSH
10.0000 mL | Freq: Two times a day (BID) | INTRAVENOUS | Status: DC
  Administered 2022-12-15 – 2022-12-16 (×4): 10 mL
  Administered 2022-12-17: 20 mL
  Administered 2022-12-18 – 2022-12-19 (×2): 10 mL
  Administered 2022-12-19: 20 mL
  Administered 2022-12-20: 10 mL

## 2022-12-15 MED ORDER — FUROSEMIDE 10 MG/ML IJ SOLN
80.0000 mg | Freq: Two times a day (BID) | INTRAMUSCULAR | Status: DC
  Administered 2022-12-15 – 2022-12-18 (×6): 80 mg via INTRAVENOUS
  Filled 2022-12-15 (×6): qty 8

## 2022-12-15 NOTE — Assessment & Plan Note (Addendum)
Continue blood pressure control with carvedilol.  

## 2022-12-15 NOTE — Progress Notes (Signed)
Progress Note   Patient: Shelley Douglas DOB: 1950/03/26 DOA: 12/13/2022     2 DOS: the patient was seen and examined on 12/15/2022   Brief hospital course: Shelley Douglas was admitted to the hospital with the working diagnosis of atrial fibrillation with rapid ventricular response.   72 yo female with the past medical history of heart failure, atrial fibrillation, ventricular tachycardia, coronary artery disease, hypertension, dyslipidemia, CVA, T2DM, pulmonary embolism and DVT who presented with dyspnea and palpitations. Reported 2 days of progressive dyspnea, associated with exertional chest pressure, palpitations, orthopnea and lower extremity edema. Apparently she has been not adherent to her medications at home. On her initial physical examination her blood pressure was 125/101, HR 132, RR 21 and 02 saturation 90%, lungs with no wheezing or rales, no rhonchi, heart with S1 and S2 present, irregular with no gallops, or murmurs, abdomen with no distention and positive lower extremity edema.   EKG 129 bpm, right axis deviation, normal intervals, atrial flutter rhythm with one PVC, poor R R wave progression with no significant ST segment or  T wave changes.    Patient was placed on amiodarone drip for rate control.  12/11 plan for TEE cardioversion today.,      Assessment and Plan: * Atrial flutter with rapid ventricular response (HCC) Patient has been placed on amiodarone drip for rate control. Anticoagulation with apixaban  Continue carvedilol   Plan for direct current cardioversion today.  Continue telemetry monitoring   Acute on chronic systolic CHF (congestive heart failure) (HCC) Echocardiogram with reduced LV systolic function with EF 20 to 25 %, global hypokinesis, LV cavity with severe dilatation, mild LVH, RV systolic function with severe reduction, RV cavity with moderate enlargement, RVSP 48.9 mmHg, LA and RA with severe dilatation, moderate to severe  mitral valve regurgitation (it has been replaced/repair), moderate to severe tricuspid regurgitation   Urine output is 1,550  Systolic blood pressure is 95 to 105 mmHg.  Plan to continue diuresis with furosemide 80 mg IV q 12 hrs Limited pharmacologic therapy due to risk of hypotension.   Acute kidney injury superimposed on chronic kidney disease (HCC) Hyponatremia CKD stage 2   Renal function with serum cr at 1,53 with K at 4,9 and serum bicarbonate at 24 Na 133   Continue diuresis with IV furosemide, follow up renal function and electrolytes in am.   CAD (coronary artery disease)/HLD No chest pain,   Essential hypertension Continue close blood pressure monitoring   Cerebral thrombosis with cerebral infarction Continue anticoagulation with apixaban and continue with statin.   Type 2 diabetes mellitus with hyperlipidemia (HCC) Continue glucose cover and monitoring with insulin  Continue with statin therapy.        Subjective: Patient is feeling better, no chest pain or dyspnea, no PND or orthopnea   Physical Exam: Vitals:   12/15/22 0700 12/15/22 0851 12/15/22 0900 12/15/22 1000  BP: 112/81  115/82 (!) 106/94  Pulse:   (!) 114 (!) 116  Resp: 15  (!) 23 (!) 25  Temp:  97.8 F (36.6 C)    TempSrc:  Oral    SpO2:   100% 100%  Weight:      Height:       Neurology awake and alert ENT with mild pallor Cardiovascular with S1 and S2 present, irregularly irregular with no gallops, rubs or murmurs Positive JVD Respiratory with no rales or wheezing, no rhonchi Abdomen with no distention  Data Reviewed:    Family Communication: I  spoke with patient's family at the bedside, we talked in detail about patient's condition, plan of care and prognosis and all questions were addressed.   Disposition: Status is: Inpatient Remains inpatient appropriate because: telemetry monitoring   Planned Discharge Destination: Home      Author: Coralie Keens,  MD 12/15/2022 12:51 PM  For on call review [email or web address].

## 2022-12-15 NOTE — Plan of Care (Signed)

## 2022-12-15 NOTE — Progress Notes (Signed)
Peripherally Inserted Central Catheter Placement  The IV Nurse has discussed with the patient and/or persons authorized to consent for the patient, the purpose of this procedure and the potential benefits and risks involved with this procedure.  The benefits include less needle sticks, lab draws from the catheter, and the patient may be discharged home with the catheter. Risks include, but not limited to, infection, bleeding, blood clot (thrombus formation), and puncture of an artery; nerve damage and irregular heartbeat and possibility to perform a PICC exchange if needed/ordered by physician.  Alternatives to this procedure were also discussed.  Bard Power PICC patient education guide, fact sheet on infection prevention and patient information card has been provided to patient /or left at bedside.    PICC Placement Documentation  PICC Double Lumen 12/15/22 Right Basilic 41 cm 0 cm (Active)  Indication for Insertion or Continuance of Line Vasoactive infusions 12/15/22 0828  Exposed Catheter (cm) 0 cm 12/15/22 0828  Site Assessment Clean, Dry, Intact 12/15/22 0828  Lumen #1 Status Flushed;Saline locked;Blood return noted 12/15/22 0828  Lumen #2 Status Flushed;Saline locked;Blood return noted 12/15/22 0828  Dressing Type Transparent;Securing device 12/15/22 0828  Dressing Status Antimicrobial disc in place;Clean, Dry, Intact 12/15/22 0828  Line Care Connections checked and tightened 12/15/22 0828  Line Adjustment (NICU/IV Team Only) No 12/15/22 0828  Dressing Intervention New dressing;Adhesive placed at insertion site (IV team only);Other (Comment) 12/15/22 0828  Dressing Change Due 12/22/22 12/15/22 0828       Annett Fabian 12/15/2022, 8:29 AM

## 2022-12-15 NOTE — Assessment & Plan Note (Signed)
Continue anticoagulation with apixaban and continue with statin.

## 2022-12-15 NOTE — Assessment & Plan Note (Signed)
No chest pain,

## 2022-12-15 NOTE — TOC Initial Note (Addendum)
Transition of Care [facility]) - Initial/Assessment Note    Patient Details  Name: Shelley Douglas MRN: [id number] Date of Birth: Jan 14, 1950  Transition of Care [facility]) CM/SW Contact:    Nicanor Bake Phone Number: [phone] 12/15/2022, 10:51 AM  Clinical Narrative:    10:00 AM- HF CSW attempted to meet with pt at bedside. Pts room was occupied at the time. CSW will follow up with pt at a more appropriate time.   TOC will continue following.                  Barriers to Discharge: Continued Medical Work up   Patient Goals and CMS Choice Patient states their goals for this hospitalization and ongoing recovery are:: "get to feeling better"          Expected Discharge Plan and Services                                              Prior Living Arrangements/Services                       Activities of Daily Living   ADL Screening (condition at time of admission) Independently performs ADLs?: No Does the patient have a NEW difficulty with bathing/dressing/toileting/self-feeding that is expected to last >3 days?: No Does the patient have a NEW difficulty with getting in/out of bed, walking, or climbing stairs that is expected to last >3 days?: No Does the patient have a NEW difficulty with communication that is expected to last >3 days?: No Is the patient deaf or have difficulty hearing?: No Does the patient have difficulty seeing, even when wearing glasses/contacts?: Yes Does the patient have difficulty concentrating, remembering, or making decisions?: Yes  Permission Sought/Granted                  Emotional Assessment              Admission diagnosis:  Palpitations [R00.2] Shortness of breath [R06.02] Atrial flutter with rapid ventricular response (HCC) [I48.92] AKI (acute kidney injury) (HCC) [N17.9] Atrial fibrillation with RVR (HCC) [I48.91] Acute on chronic congestive heart failure, unspecified heart failure type [facility])  [I50.9] Patient Active Problem List   Diagnosis Date Noted   Atrial flutter with rapid ventricular response (HCC) 12/13/2022   Elevated d-dimer 12/13/2022   Chest pain 12/13/2022   Stage 1 acute kidney injury (HCC) 12/13/2022   Cerebral thrombosis with cerebral infarction 01/14/2021   Stroke-like symptoms with dizziness, visual deficits and difficulty speaking  01/13/2021   Type 2 diabetes mellitus without complication (HCC) 01/13/2021   Essential hypertension 01/13/2021   CAD (coronary artery disease)/HLD 01/13/2021   Hypercalcemia 01/13/2021   Acute exacerbation of CHF (congestive heart failure) (HCC) 05/13/2016   Chronic systolic dysfunction of left ventricle 07/01/2015   Dizziness 09/24/2014   Ischemic cardiomyopathy 08/07/2010   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) 01/29/2010   PULMONARY EMBOLISM 01/29/2010   VENTRICULAR TACHYCARDIA 01/29/2010   Atrial fibrillation (HCC) 01/29/2010   Chronic systolic congestive heart failure with BiV-ICD 01/29/2010   Cerebral artery occlusion with cerebral infarction (HCC) 01/29/2010   Implantable cardioverter-defibrillator (ICD) in situ 01/29/2010   PCP:  Rometta Emery, MD Pharmacy:   [facility] [phone] [place], [place] - [address] AT [facility] [address] [place] [address] Phone:  [phone] Fax: [phone]     Social Determinants of Health (SDOH) Social History: SDOH Screenings   Food Insecurity: No Food Insecurity (12/14/2022)  Housing: High Risk (12/14/2022)  Transportation Needs: No Transportation Needs (12/14/2022)  Utilities: Not At Risk (12/14/2022)  Tobacco Use: Medium Risk (12/13/2022)   SDOH Interventions:     Readmission Risk Interventions     No data to display

## 2022-12-15 NOTE — Assessment & Plan Note (Addendum)
Hyponatremia, hypokalemia  CKD stage 2   Volume status has improved, at the time of her discharge her serum cr is 1,4 with K at 4,4 and serum bicarbonate at 32.  Na 134 and Mg 2.3   Continue diuresis with oral torsemide and follow up renal function as outpatient.

## 2022-12-15 NOTE — Assessment & Plan Note (Addendum)
Echocardiogram with reduced LV systolic function with EF 20 to 25 %, global hypokinesis, LV cavity with severe dilatation, mild LVH, RV systolic function with severe reduction, RV cavity with moderate enlargement, RVSP 48.9 mmHg, LA and RA with severe dilatation, moderate to severe mitral valve regurgitation (it has been replaced/repair), moderate to severe tricuspid regurgitation   Urine output is 2,300  Systolic blood pressure is 120 mmHg. SV02 has been improving.   Plan to continue diuresis with furosemide 80 mg IV q 12 hrs On milrinone for inotropic support.  Limited pharmacologic therapy due to risk of hypotension.

## 2022-12-15 NOTE — Progress Notes (Addendum)
PHARMACY - ANTICOAGULATION CONSULT NOTE  Pharmacy Consult for Heparin Indication: Hx DVT, Afib   Allergies  Allergen Reactions   Plavix [Clopidogrel Bisulfate] Rash    Patient Measurements: Height: 5\' 6"  (167.6 cm) Weight: 92.3 kg (203 lb 7.8 oz) IBW/kg (Calculated) : 59.3 Heparin Dosing Weight: 79.8 kg  Vital Signs: Temp: 97.6 F (36.4 C) (12/11 0431) Temp Source: Oral (12/11 0431) BP: 112/81 (12/11 0700)  Labs: Recent Labs    12/13/22 1420 12/13/22 1420 12/13/22 1648 12/13/22 2047 12/14/22 0301 12/14/22 0705 12/14/22 0844 12/14/22 1934 12/15/22 0415  HGB 12.6  --   --   --  11.8*  --   --   --  11.3*  HCT 39.8  --   --   --  37.0  --   --   --  35.7*  PLT 222  --   --   --  213  --   --   --  203  APTT  --    < >  --  33  --  166* 126* 99* 115*  LABPROT  --   --   --  15.2  --   --   --   --   --   INR  --   --   --  1.2  --   --   --   --   --   HEPARINUNFRC  --    < >  --  0.44  --  0.99*  --  0.74* 0.51  CREATININE 1.95*  --   --   --  1.51*  --   --   --  1.53*  TROPONINIHS 32*  --  36*  --  36*  --   --   --   --    < > = values in this interval not displayed.    Estimated Creatinine Clearance: 38 mL/min (A) (by C-G formula based on SCr of 1.53 mg/dL (H)).   Medical History: Past Medical History:  Diagnosis Date   AICD (automatic cardioverter/defibrillator) present    Anxiety    pt denies this hx on 05/12/2016   CAD (coronary artery disease)    last cath 2016   Daily headache    DVT (deep venous thrombosis) (HCC) 1996   High cholesterol    History of blood transfusion    "related to pregnancy" (05/12/2016)   Hypertension    "just started taking RX" (05/12/2016)   Ischemic cardiomyopathy    Left bundle branch block    Mitral valve regurgitation    Myocardial infarction [facility]) ~ 2009 X2   Obesity    Persistent atrial fibrillation (HCC)    Pulmonary embolism (HCC) 1993   Seizures (HCC) 1970s   "I had 1; don't know what it was from" (05/12/2016)    Stroke (HCC) 2001   denies residual on 05/12/2016   Type II diabetes mellitus (HCC)     Medications:  Scheduled:   aspirin EC  81 mg Oral Daily   atorvastatin  40 mg Oral Daily   Chlorhexidine Gluconate Cloth  6 each Topical Daily   furosemide  40 mg Intravenous BID   insulin aspart  0-6 Units Subcutaneous TID WC   sodium chloride flush  3 mL Intravenous Q12H   Infusions:   amiodarone 30 mg/hr (12/15/22 0556)   heparin 1,150 Units/hr (12/15/22 0556)   PRN:   Assessment: 72 yo female history of PE/DVT on Eliquis presents with  [facility] and palpitations. Pharmacy consulted to  dose IV heparin for rule out PE. Of note, patient states she only takes Eliquis 4 times per week.  Anti-Xa level therapeutic this AM at 0.51 (baseline PTA 0.44), aPTT supra-therapeutic at 115. Heparin level and aPTT were close to correlating last night however now aPTT seems falsely elevated. Discussed with RN, potentially 1 lab drawn from left hand and another from right hand. Suspect that aPTT was drawn downstream from heparin causing falsely high level.   Goal of Therapy:  Heparin level 0.3-0.7 units/ml aPTT 66-102 seconds Monitor platelets by anticoagulation protocol: Yes   Plan:  Continue heparin drip at 1150 units/hr Will utilize anti-Xa levels for monitoring, recheck in 8 hours given differences in levels.  F/u ability to transition back to Eliquis  Estill Batten, PharmD, BCCCP  Clinical Pharmacist 12/15/2022 8:01 AM  Please check AMION for all [facility] Pharmacy phone numbers After 10:00 PM, call Main Pharmacy [phone]

## 2022-12-15 NOTE — Progress Notes (Addendum)
Advanced Heart Failure Rounding Note  PCP-Cardiologist: Charlton Haws, MD   Subjective:    Co-ox 61. CVP 18. sCr stable 1.5 Remains in Atachy 100-110s  Feeling better today. No SOB. No CP.   Objective:    Weight Range: 92.3 kg Body mass index is 32.84 kg/m.   Vital Signs:   Temp:  [97.5 F (36.4 C)-98 F (36.7 C)] 97.8 F (36.6 C) (12/11 0851) Pulse Rate:  [90-123] 116 (12/11 1000) Resp:  [15-29] 25 (12/11 1000) BP: (101-156)/(76-140) 106/94 (12/11 1000) SpO2:  [89 %-100 %] 100 % (12/11 1000) Weight:  [92.3 kg] 92.3 kg (12/10 1622) Last BM Date : 12/13/22 (per patient)  Weight change: Filed Weights   12/13/22 1356 12/14/22 1622  Weight: 93 kg 92.3 kg   Intake/Output:   Intake/Output Summary (Last 24 hours) at 12/15/2022 1114 Last data filed at 12/15/2022 0906 Gross per 24 hour  Intake 1077.38 ml  Output --  Net 1077.38 ml    Physical Exam    CVP 18 General: Weak appearing. No distress on RA HEENT: neck supple.   Cardiac: JVP to midneck. S1 and S2 present. No murmurs or rub. Resp: Lung sounds clear and equal B/L Abdomen: Soft, non-tender, distended. + BS. Extremities: Warm and dry. No rash, cyanosis.  1+ edema.  Neuro: Alert and oriented x3. Affect pleasant. Moves all extremities with weakness. Lines/Devices: RUE PICC  Telemetry   Atrial tach in 100-120s (personally reviewed)  EKG    Atrial tach 131 bpm with PVC (personally reviewed)  Labs    CBC Recent Labs    12/14/22 0301 12/15/22 0415  WBC 5.4 4.5  HGB 11.8* 11.3*  HCT 37.0 35.7*  MCV 88.7 89.0  PLT 213 203   Basic Metabolic Panel Recent Labs    52/84/13 0301 12/15/22 0415  NA 138 133*  K 3.8 4.9  CL 104 103  CO2 25 24  GLUCOSE 134* 144*  BUN 33* 26*  CREATININE 1.51* 1.53*  CALCIUM 10.3 9.7  MG 1.8  --   PHOS 3.3  --    Liver Function Tests No results for input(s): "AST", "ALT", "ALKPHOS", "BILITOT", "PROT", "ALBUMIN" in the last 72 hours. No results for  input(s): "LIPASE", "AMYLASE" in the last 72 hours. Cardiac Enzymes No results for input(s): "CKTOTAL", "CKMB", "CKMBINDEX", "TROPONINI" in the last 72 hours.  BNP: BNP (last 3 results) Recent Labs    12/13/22 1420  BNP 1,062.6*    ProBNP (last 3 results) No results for input(s): "PROBNP" in the last 8760 hours.   D-Dimer Recent Labs    12/13/22 1420  DDIMER 6.05*   Hemoglobin A1C Recent Labs    12/14/22 0301  HGBA1C 6.7*   Fasting Lipid Panel Recent Labs    12/14/22 0301  CHOL 211*  HDL 51  LDLCALC 146*  TRIG 70  CHOLHDL 4.1   Thyroid Function Tests Recent Labs    12/14/22 0301  TSH 1.021   Other results:  Imaging   NM Pulmonary Perfusion  Result Date: 12/14/2022 CLINICAL DATA:  Acute on chronic systolic heart failure, PE suspected EXAM: NUCLEAR MEDICINE PERFUSION LUNG SCAN TECHNIQUE: Perfusion images were obtained in multiple projections after intravenous injection of radiopharmaceutical. Ventilation scans intentionally deferred if perfusion scan and chest x-ray adequate for interpretation during COVID 19 epidemic. RADIOPHARMACEUTICALS:  4.4 mCi Tc-6m MAA IV COMPARISON:  Radiograph 12/13/2022 FINDINGS: Heterogenous distribution of radiopharmaceutical with no segmental or subsegmental unexplained defects to suggest acute PE. Left anterior defect from AICD generator  noted. IMPRESSION: Low likelihood ratio for pulmonary embolus. Electronically Signed   By: Corlis Leak M.D.   On: 12/14/2022 15:37   [place] EKG SITE RITE  Result Date: 12/14/2022 If Site Rite image not attached, placement could not be confirmed due to current cardiac rhythm.   Medications:    Scheduled Medications:  apixaban  5 mg Oral BID   aspirin EC  81 mg Oral Daily   atorvastatin  40 mg Oral Daily   Chlorhexidine Gluconate Cloth  6 each Topical Daily   furosemide  40 mg Intravenous Once   furosemide  80 mg Intravenous BID   insulin aspart  0-6 Units Subcutaneous TID WC   sodium chloride  flush  10-40 mL Intracatheter Q12H   sodium chloride flush  3 mL Intravenous Q12H    Infusions:  amiodarone 30 mg/hr (12/15/22 0912)    PRN Medications: acetaminophen, albuterol, mouth rinse, sodium chloride flush   Patient Profile   Shelley Douglas 72 y/o female w/ h/o chronic systolic heart failure dating back to 2009 due to ICM, CAD w/ remote inferior infarct w/ stenting to the RCA and subsequent CTO of RCA on repeat cath in 2016, s/p ICD w/ upgrade to CRT-D for LBBB, Afib/flutter, MV disease s/p remote MV annuloplasty in 1996, DVT/PE s/p IVC filter, on Eliquis, CVA, HTN, HLD, Type 2DM, CKD IIIa and b/l carpal tunnel s/p b/l CTR, admitted for a/c CHF in the setting of atrial flutter w/ RVR   Assessment/Plan   1. Acute on Chronic Systolic Heart Failure - Long standing since 2009. Ischemic CM. Also w/ LBBB s/p CRT-D  - Last echo 11/23 EF 30-35%, global HK, mild LVH, RV mildly reduced - admitted w/ NYHA Class IIIb symptoms and volume overload, in setting of dietary and medication noncompliance. This is further c/b rapid AFL.  - w/ h/o b/l carpal tunnel, consider w/u for TTR amyloid w/ PYP scan. - CVP 18. Co-ox 61. Lasix 80 BID today - TED hoses  - On very little GDMT PTA, only Coreg 25 mg bid per med rec. Had previously been on [place], Entresto and digoxin. Not clear when/why meds were stopped. - Hold coreg for now given risk for low output - Hold Entresto and Digoxin for now. Restart when AKI improved - Will need TEE/DCCV likely tomorrow/Friday, pending diuresis   2. AFL w/ RVR - h/o Afib/Flutter, on Eliquis - in AFL x 4 days per device interrogation. Suspect CHF triggered arrhythmia as HF symptoms present x 2 wks - On Amio gtt for rate control - On Eliquis  - Plan TEE/DCCV after diuresis (missed doses of Eliquis in last month)  - Recommend out patient sleep study    3. AKI on CKD IIIa - B/l SCr 1.1 - Admit SCr 1.95 - Stable today at 1.5 - monitor BP w/ diuresis    4. H/o PE/DVT - s/p IVC filter. Also on Eliquis but poor compliance  - D-dimer elevated, 6.05 - V/Q scan low likelihood for PE    5. LBBB - s/p CRT-D - only 90% Bi-V pacing, hopefully will improve once back in NSR    6. CAD - remote inferior infarct, BMS prox and mid RCA  - known CTO/ISR of RCA by cath 2016, treated medically  - denies CP. HS trop not c/w ACS - continue medical management    7. Mitral Valve Disorder - s/p MV annuloplasty in 1996 - stable on last echo 11/23 w/ mild MS, mG 4 mmHg - repeat echo  pending    8. Suspected OSA - h/o snoring  - recommend out patient sleep study   Length of Stay: 2  [place] Lee, NP  12/15/2022, 11:14 AM  Advanced Heart Failure Team Pager [phone] (M-F; 7a - 5p)  Please contact [facility] for night-coverage after hours (5p -7a ) and weekends on [email or web address]  Agree with above.   Remains in AF despite IV amio. CVP 18-20 Co-ox 61%   Denies CP or SOB.   Echo EF 20-25% RV severely reduced  mod-sev TR Personally reviewed  General:  Elderly  No resp difficulty HEENT: normal Neck: supple. JVP to ear Carotids 2+ bilat; no bruits. No lymphadenopathy or thryomegaly appreciated. Cor: Irregular rate & rhythm. Tachy Lungs: clear Abdomen: obese  soft, nontender, nondistended. No hepatosplenomegaly. No bruits or masses. Good bowel sounds. Extremities: no cyanosis, clubbing, rash, 1+ edema Neuro: alert & orientedx3, cranial nerves grossly intact. moves all 4 extremities w/o difficulty. Affect pleasant  She has severe biventricular HF currently R>L. Now with AF with RVR. Co-ox ok but CVP markedly elevated. Will continue IV amio. Switch to Eliquis.   Increase IV diuretics.   Plan TEE/DC-CV tomorrow if more stable.   CRITICAL CARE Performed by: Arvilla Meres  Total critical care time: 40 minutes  Critical care time was exclusive of separately billable procedures and treating other patients.  Critical care was necessary to treat  or prevent imminent or life-threatening deterioration.  Critical care was time spent personally by me (independent of midlevel providers or residents) on the following activities: development of treatment plan with patient and/or surrogate as well as nursing, discussions with consultants, evaluation of patient's response to treatment, examination of patient, obtaining history from patient or surrogate, ordering and performing treatments and interventions, ordering and review of laboratory studies, ordering and review of radiographic studies, pulse oximetry and re-evaluation of patient's condition.  Arvilla Meres, MD  4:36 PM

## 2022-12-15 NOTE — Hospital Course (Addendum)
Shelley Douglas was admitted to the hospital with the working diagnosis of atrial fibrillation with rapid ventricular response.   72 yo female with the past medical history of heart failure, atrial fibrillation, ventricular tachycardia, coronary artery disease, hypertension, dyslipidemia, CVA, T2DM, pulmonary embolism and DVT who presented with dyspnea and palpitations. Reported 2 days of progressive dyspnea, associated with exertional chest pressure, palpitations, orthopnea and lower extremity edema. Apparently she has been not adherent to her medications at home. On her initial physical examination her blood pressure was 125/101, HR 132, RR 21 and 02 saturation 90%, lungs with no wheezing or rales, no rhonchi, heart with S1 and S2 present, irregular with no gallops, or murmurs, abdomen with no distention and positive lower extremity edema.   EKG 129 bpm, right axis deviation, normal intervals, atrial flutter rhythm with one PVC, poor R R wave progression with no significant ST segment or  T wave changes.    Patient was placed on amiodarone drip for rate control.  12/11 plan for TEE cardioversion when improved volume status.  12/12 continue volume overloaded.  12/13 direct current cardioversion today.

## 2022-12-15 NOTE — Assessment & Plan Note (Addendum)
Uncontrolled hyperglycemia.  Patient required insulin sliding scale and low dose basal dosing for glucose control.  Patient has been placed on SGLT 2 inh.  Follow up as outpatient.   Continue with statin therapy.

## 2022-12-15 NOTE — Progress Notes (Addendum)
Rounding Note    Patient Name: Shelley Douglas Date of Encounter: 12/15/2022  [facility] Cardiologist: Charlton Haws, MD   Subjective   She slept well.  Without nasal cannula O2 sat is 100%.  She is able to lie flat  Inpatient Medications    Scheduled Meds:  apixaban  5 mg Oral BID   aspirin EC  81 mg Oral Daily   atorvastatin  40 mg Oral Daily   Chlorhexidine Gluconate Cloth  6 each Topical Daily   furosemide  40 mg Intravenous Once   furosemide  80 mg Intravenous BID   insulin aspart  0-6 Units Subcutaneous TID WC   sodium chloride flush  10-40 mL Intracatheter Q12H   sodium chloride flush  3 mL Intravenous Q12H   Continuous Infusions:  amiodarone 30 mg/hr (12/15/22 0912)   PRN Meds: acetaminophen, albuterol, mouth rinse, sodium chloride flush   Vital Signs    Vitals:   12/15/22 0700 12/15/22 0851 12/15/22 0900 12/15/22 1000  BP: 112/81  115/82 (!) 106/94  Pulse:   (!) 114 (!) 116  Resp: 15  (!) 23 (!) 25  Temp:  97.8 F (36.6 C)    TempSrc:  Oral    SpO2:   100% 100%  Weight:      Height:        Intake/Output Summary (Last 24 hours) at 12/15/2022 1035 Last data filed at 12/15/2022 0906 Gross per 24 hour  Intake 1077.38 ml  Output --  Net 1077.38 ml      12/14/2022    4:22 PM 12/13/2022    1:56 PM 11/29/2022    8:02 AM  Last 3 Weights  Weight (lbs) 203 lb 7.8 oz 205 lb 205 lb 6.4 oz  Weight (kg) 92.3 kg 92.987 kg 93.169 kg      Telemetry    Atypical atrial flutter, with V pacing and intrinsic rhythm - Personally Reviewed  ECG    No new- personally Reviewed  Physical Exam   Vitals:   12/15/22 0900 12/15/22 1000  BP: 115/82 (!) 106/94  Pulse: (!) 114 (!) 116  Resp: (!) 23 (!) 25  Temp:    SpO2: 100% 100%    GEN: No acute distress.   Cardiac: IRRR, no murmurs, rubs, or gallops.  Neck: JVD+  Respiratory: Normal work of breathing, mild decreased breath sounds GI: Soft, nontender, non-distended  MS: No edema; No  deformity. Neuro:  Nonfocal  Psych: Normal affect   Labs    High Sensitivity Troponin:   Recent Labs  Lab 12/13/22 1420 12/13/22 1648 12/14/22 0301  TROPONINIHS 32* 36* 36*     Chemistry Recent Labs  Lab 12/13/22 1420 12/14/22 0301 12/15/22 0415  NA 138 138 133*  K 4.2 3.8 4.9  CL 107 104 103  CO2 22 25 24   GLUCOSE 150* 134* 144*  BUN 36* 33* 26*  CREATININE 1.95* 1.51* 1.53*  CALCIUM 10.2 10.3 9.7  MG  --  1.8  --   GFRNONAA 27* 37* 36*  ANIONGAP 9 9 6     Lipids  Recent Labs  Lab 12/14/22 0301  CHOL 211*  TRIG 70  HDL 51  LDLCALC 146*  CHOLHDL 4.1    Hematology Recent Labs  Lab 12/13/22 1420 12/14/22 0301 12/15/22 0415  WBC 5.8 5.4 4.5  RBC 4.46 4.17 4.01  HGB 12.6 11.8* 11.3*  HCT 39.8 37.0 35.7*  MCV 89.2 88.7 89.0  MCH 28.3 28.3 28.2  MCHC 31.7 31.9 31.7  RDW 15.9* 16.3* 16.5*  PLT 222 213 203   Thyroid  Recent Labs  Lab 12/14/22 0301  TSH 1.021    BNP Recent Labs  Lab 12/13/22 1420  BNP 1,062.6*    DDimer  Recent Labs  Lab 12/13/22 1420  DDIMER 6.05*     Radiology    VAS [place] LOWER EXTREMITY VENOUS (DVT)  Result Date: 12/14/2022  Lower Venous DVT Study Patient Name:  Shelley Douglas  Date of Exam:   12/14/2022 Medical Rec #: [id number]           Accession #:    [id number] Date of Birth: 02-16-1950           Patient Gender: F Patient Age:   72 years Exam Location:  [facility] Procedure:      VAS [place] LOWER EXTREMITY VENOUS (DVT) Referring Phys: Dolly Rias --------------------------------------------------------------------------------  Indications: Swelling.  Risk Factors: Hx of DVT & PE. Anticoagulation: Eliquis. Limitations: Body habitus and poor ultrasound/tissue interface. Comparison Study: Previous exam on 09/25/2014 was positive for BLE chronic DVT. Performing Technologist: Ernestene Mention RVT, RDMS  Examination Guidelines: A complete evaluation includes B-mode imaging, spectral Doppler, color Doppler, and power Doppler  as needed of all accessible portions of each vessel. Bilateral testing is considered an integral part of a complete examination. Limited examinations for reoccurring indications may be performed as noted. The reflux portion of the exam is performed with the patient in reverse Trendelenburg.  +---------+---------------+---------+-----------+----------+--------------+ RIGHT    CompressibilityPhasicitySpontaneityPropertiesThrombus Aging +---------+---------------+---------+-----------+----------+--------------+ CFV      Full           No       Yes                                 +---------+---------------+---------+-----------+----------+--------------+ SFJ      Full                                                        +---------+---------------+---------+-----------+----------+--------------+ FV Prox  Full           No       Yes                                 +---------+---------------+---------+-----------+----------+--------------+ FV Mid   Full           No       Yes                                 +---------+---------------+---------+-----------+----------+--------------+ FV DistalFull           No       Yes                                 +---------+---------------+---------+-----------+----------+--------------+ PFV      Full                                                        +---------+---------------+---------+-----------+----------+--------------+  POP      Partial        No       Yes                  Chronic        +---------+---------------+---------+-----------+----------+--------------+ PTV      Full                                                        +---------+---------------+---------+-----------+----------+--------------+ PERO     Full                                                        +---------+---------------+---------+-----------+----------+--------------+ Abnormal waveforms noted - likely due to elevated right  heart pressure / CHF  +---------+---------------+---------+-----------+----------+--------------+ LEFT     CompressibilityPhasicitySpontaneityPropertiesThrombus Aging +---------+---------------+---------+-----------+----------+--------------+ CFV      Full           No       Yes                                 +---------+---------------+---------+-----------+----------+--------------+ SFJ      Full                                                        +---------+---------------+---------+-----------+----------+--------------+ FV Prox  Full           No       Yes                                 +---------+---------------+---------+-----------+----------+--------------+ FV Mid   Full           No       Yes                                 +---------+---------------+---------+-----------+----------+--------------+ FV DistalFull           No       Yes                                 +---------+---------------+---------+-----------+----------+--------------+ PFV      Full                                                        +---------+---------------+---------+-----------+----------+--------------+ POP      Full           No       Yes                                 +---------+---------------+---------+-----------+----------+--------------+  PTV      Full                                                        +---------+---------------+---------+-----------+----------+--------------+ PERO     Full                                                        +---------+---------------+---------+-----------+----------+--------------+ Abnormal waveforms noted - likely due to elevated right heart pressure / CHF    Summary: BILATERAL: -No evidence of popliteal cyst, bilaterally. 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  LEFT: - There is no evidence of deep vein thrombosis in the lower extremity.  *See  table(s) above for measurements and observations.    Preliminary    NM Pulmonary Perfusion  Result Date: 12/14/2022 CLINICAL DATA:  Acute on chronic systolic heart failure, PE suspected EXAM: NUCLEAR MEDICINE PERFUSION LUNG SCAN TECHNIQUE: Perfusion images were obtained in multiple projections after intravenous injection of radiopharmaceutical. Ventilation scans intentionally deferred if perfusion scan and chest x-ray adequate for interpretation during COVID 19 epidemic. RADIOPHARMACEUTICALS:  4.4 mCi Tc-64m MAA IV COMPARISON:  Radiograph 12/13/2022 FINDINGS: Heterogenous distribution of radiopharmaceutical with no segmental or subsegmental unexplained defects to suggest acute PE. Left anterior defect from AICD generator noted. IMPRESSION: Low likelihood ratio for pulmonary embolus. Electronically Signed   By: Corlis Leak M.D.   On: 12/14/2022 15:37   [place] EKG SITE RITE  Result Date: 12/14/2022 If Site Rite image not attached, placement could not be confirmed due to current cardiac rhythm.  ECHOCARDIOGRAM COMPLETE  Result Date: 12/14/2022    ECHOCARDIOGRAM REPORT   Patient Name:   Shelley Douglas Date of Exam: 12/14/2022 Medical Rec #:  [id number]          Height:       66.0 in Accession #:    [id number]         Weight:       205.0 lb Date of Birth:  05-12-1950          BSA:          2.021 m Patient Age:    72 years           BP:           128/81 mmHg Patient Gender: F                  HR:           73 bpm. Exam Location:  Inpatient Procedure: 2D Echo, Color Doppler and Cardiac Doppler Indications:    I50.9* Heart failure (unspecified)  History:        Patient has prior history of Echocardiogram examinations, most                 recent 11/17/2021. CHF, Pacemaker and Defibrillator,                 Arrythmias:Atrial Flutter and Atrial Fibrillation; Risk                 Factors:Hypertension and Diabetes. MV Annuloplasty prior to  2013.                  Mitral Valve: prosthetic annuloplasty  ring valve is present in                 the mitral position. Procedure Date: prior to 2013.  Sonographer:    Irving Burton Senior RDCS Referring Phys: [id number] Dolly Rias IMPRESSIONS  1. Left ventricular ejection fraction, by estimation, is 20 to 25%. Left ventricular ejection fraction by 2D MOD biplane is 22.3 %. The left ventricle has severely decreased function. The left ventricle demonstrates global hypokinesis. The left ventricular internal cavity size was severely dilated. There is mild concentric left ventricular hypertrophy. Left ventricular diastolic function could not be evaluated.  2. Right ventricular systolic function is severely reduced. The right ventricular size is moderately enlarged. There is moderately elevated pulmonary artery systolic pressure. The estimated right ventricular systolic pressure is 48.9 mmHg.  3. Left atrial size was severely dilated.  4. Right atrial size was severely dilated.  5. The mitral valve has been repaired/replaced. Moderate to severe mitral valve regurgitation. Moderate mitral stenosis. The mean mitral valve gradient is 6.7 mmHg with average heart rate of 68 bpm. There is a prosthetic annuloplasty ring present in the  mitral position. Procedure Date: prior to 2013.  6. The tricuspid valve is abnormal. Tricuspid valve regurgitation is moderate to severe.  7. The aortic valve has an indeterminant number of cusps. Aortic valve regurgitation is not visualized. No aortic stenosis is present.  8. The inferior vena cava is dilated in size with <50% respiratory variability, suggesting right atrial pressure of 15 mmHg. Comparison(s): Prior images reviewed side by side. The left ventricular function is worsened. The right ventricular systolic function is worse. Mitral and tricuspid insufficiency ar worse. Right atrail pressure and pulmonary artery pressure are higher. FINDINGS  Left Ventricle: Left ventricular ejection fraction, by estimation, is 20 to 25%. Left ventricular ejection  fraction by 2D MOD biplane is 22.3 %. The left ventricle has severely decreased function. The left ventricle demonstrates global hypokinesis. The left ventricular internal cavity size was severely dilated. There is mild concentric left ventricular hypertrophy. Abnormal (paradoxical) septal motion consistent with post-operative status. Left ventricular diastolic function could not be evaluated due to mitral valve repair. Left ventricular diastolic function could not be evaluated. Right Ventricle: The right ventricular size is moderately enlarged. No increase in right ventricular wall thickness. Right ventricular systolic function is severely reduced. There is moderately elevated pulmonary artery systolic pressure. The tricuspid regurgitant velocity is 2.91 m/s, and with an assumed right atrial pressure of 15 mmHg, the estimated right ventricular systolic pressure is 48.9 mmHg. Left Atrium: Left atrial size was severely dilated. Right Atrium: Right atrial size was severely dilated. Pericardium: Trivial pericardial effusion is present. Mitral Valve: The mitral valve has been repaired/replaced. Moderate to severe mitral valve regurgitation. There is a prosthetic annuloplasty ring present in the mitral position. Procedure Date: prior to 2013. Moderate mitral valve stenosis. MV peak gradient, 14.6 mmHg. The mean mitral valve gradient is 6.7 mmHg with average heart rate of 68 bpm. Tricuspid Valve: The tricuspid valve is abnormal. Tricuspid valve regurgitation is moderate to severe. The flow in the hepatic veins is reversed during ventricular systole. Aortic Valve: The aortic valve has an indeterminant number of cusps. Aortic valve regurgitation is not visualized. No aortic stenosis is present. Pulmonic Valve: The pulmonic valve was normal in structure. Pulmonic valve regurgitation is trivial. No evidence of pulmonic stenosis. Aorta:  The aortic root and ascending aorta are structurally normal, with no evidence of  dilitation. Venous: The inferior vena cava is dilated in size with less than 50% respiratory variability, suggesting right atrial pressure of 15 mmHg. The inferior vena cava and the hepatic vein show a pattern of systolic flow reversal, suggestive of tricuspid regurgitation. IAS/Shunts: No atrial level shunt detected by color flow Doppler. Additional Comments: A device lead is visualized in the right atrium and right ventricle.  LEFT VENTRICLE PLAX 2D                        Biplane EF (MOD) LVIDd:         4.90 cm         LV Biplane EF:   Left LVIDs:         4.70 cm                          ventricular LV PW:         1.14 cm                          ejection LV IVS:        1.30 cm                          fraction by LVOT diam:     2.00 cm                          2D MOD LV SV:         39                               biplane is LV SV Index:   19                               22.3 %. LVOT Area:     3.14 cm  LV Volumes (MOD) LV vol d, MOD    150.0 ml A2C: LV vol d, MOD    122.5 ml A4C: LV vol s, MOD    114.0 ml A2C: LV vol s, MOD    101.7 ml A4C: LV SV MOD A2C:   36.0 ml LV SV MOD A4C:   122.5 ml LV SV MOD BP:    31.0 ml RIGHT VENTRICLE RV S prime:     8.05 cm/s TAPSE (M-mode): 1.0 cm LEFT ATRIUM             Index        RIGHT ATRIUM           Index LA diam:        4.40 cm 2.18 cm/m   RA Area:     35.75 cm LA Vol (A2C):   77.1 ml 38.15 ml/m  RA Volume:   149.00 ml 73.73 ml/m LA Vol (A4C):   77.2 ml 38.20 ml/m LA Biplane Vol: 78.3 ml 38.75 ml/m  AORTIC VALVE LVOT Vmax:   78.97 cm/s LVOT Vmean:  55.867 cm/s LVOT VTI:    0.123 m  AORTA Ao Root diam: 3.00 cm Ao Asc diam:  3.10 cm MITRAL VALVE  TRICUSPID VALVE MV Area VTI:  1.01 cm        TR Peak grad:   33.9 mmHg MV Peak grad: 14.6 mmHg       TR Vmax:        291.00 cm/s MV Mean grad: 6.7 mmHg MV Vmax:      1.91 m/s        SHUNTS MV Vmean:     112.0 cm/s      Systemic VTI:  0.12 m MR Peak grad:    62.1 mmHg    Systemic Diam: 2.00 cm MR Mean grad:     45.7 mmHg MR Vmax:         394.00 cm/s MR Vmean:        292.7 cm/s MR PISA:         2.26 cm MR PISA Eff ROA: 22 mm MR PISA Radius:  0.60 cm Rachelle Hora Croitoru MD Electronically signed by Thurmon Fair MD Signature Date/Time: 12/14/2022/1:41:27 PM    Final    DG Chest Port 1 View  Result Date: 12/13/2022 CLINICAL DATA:  Short of breath EXAM: PORTABLE CHEST 1 VIEW COMPARISON:  10/11/2020 FINDINGS: Single frontal view of the chest demonstrates stable enlargement the cardiac silhouette allowing for differences in technique and positioning. Multi lead pacer/AICD again noted. Small right pleural effusion, with fluid again seen in the minor fissure. No airspace disease or pneumothorax. No acute bony abnormalities. IMPRESSION: 1. Small right pleural effusion, with persistent fluid in the minor fissure. 2. Stable enlarged cardiac silhouette. Electronically Signed   By: Sharlet Salina M.D.   On: 12/13/2022 15:50    Cardiac Studies   Described below  Patient Profile     Shelley Douglas is a 72 y.o. 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   Assessment & Plan    Atypical atrial flutter with RVR EF declined 20 %, prior 30%, worsening EF can be tachycardia mediated. -Plan for TEE /cardioversion; she was seen by heart failure felt that she could benefit from further diuresis prior to TEE cardioversion.  Will plan for Friday -Continue amiodarone -Continue Eliquis 5 mg twice daily [she had some missed doses]  Informed Consent   Shared Decision Making/Informed Consent   The risks [stroke, cardiac arrhythmias rarely resulting in the need for a temporary or permanent pacemaker, skin irritation or burns, esophageal damage, perforation (1:10,000 risk), bleeding, pharyngeal hematoma as well as other potential complications  associated with conscious sedation including aspiration, arrhythmia, respiratory failure and death], benefits (treatment guidance, restoration of normal sinus rhythm, diagnostic support) and alternatives of a transesophageal echocardiogram guided cardioversion were discussed in detail with Ms. Genao and she is willing to proceed.     ICM EF 30%-history of VT status post Medtronic CRT-D S/p RCA PCI EF now looks less than 20% likely tachycardia mediated.  BNP 1062.  Chest x-ray shows a small right pleural effusion.  -With her EF still low we will hold her beta-blocker; will hold additional GDMT as well (hold carvedilol, hold on restarting Entresto and digoxin, can plan for Jardiance prior to dispo) -Recommendations were made by heart failure to place PICC line and follow Co. 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     For questions or updates, please contact [facility] Please consult [email or web address] for contact info under        Signed, Maisie Fus, MD  12/15/2022, 10:35 AM

## 2022-12-15 NOTE — Assessment & Plan Note (Addendum)
Sp direct current cardioversion, converted to sinus rhythm.   Telemetry with atrial sensing and ventricular pacing.   Plan to continue with amiodarone load IV and possible transition to po likely today.  Anticoagulation with apixaban Continue carvedilol

## 2022-12-15 NOTE — Telephone Encounter (Signed)
Pt currently hospitalized.  Pt has appointment scheduled to discuss gen change end of this month.  No further action needed at this time.

## 2022-12-16 ENCOUNTER — Encounter (HOSPITAL_COMMUNITY): Payer: Self-pay | Admitting: Anesthesiology

## 2022-12-16 ENCOUNTER — Other Ambulatory Visit (HOSPITAL_COMMUNITY): Payer: Medicare Other

## 2022-12-16 ENCOUNTER — Encounter (HOSPITAL_COMMUNITY)
Admission: EM | Disposition: A | Discharge status: Home Health | Payer: Self-pay | Source: Home / Self Care | Attending: Internal Medicine

## 2022-12-16 DIAGNOSIS — I10 Essential (primary) hypertension: Secondary | ICD-10-CM | POA: Diagnosis not present

## 2022-12-16 DIAGNOSIS — I5023 Acute on chronic systolic (congestive) heart failure: Secondary | ICD-10-CM | POA: Diagnosis not present

## 2022-12-16 DIAGNOSIS — N179 Acute kidney failure, unspecified: Secondary | ICD-10-CM | POA: Diagnosis not present

## 2022-12-16 DIAGNOSIS — I4892 Unspecified atrial flutter: Secondary | ICD-10-CM | POA: Diagnosis not present

## 2022-12-16 LAB — COOXEMETRY PANEL
Carboxyhemoglobin: 1.1 % (ref 0.5–1.5)
Carboxyhemoglobin: 0.9 % (ref 0.5–1.5)
Carboxyhemoglobin: 1.5 % (ref 0.5–1.5)
Methemoglobin: <0.7 % (ref 0.0–1.5)
Methemoglobin: <0.7 % (ref 0.0–1.5)
Methemoglobin: <0.7 % (ref 0.0–1.5)
O2 Saturation: 50.9 %
O2 Saturation: 52.1 %
O2 Saturation: 61.8 %
Total hemoglobin: 11.5 g/dL — ABNORMAL LOW (ref 12.0–16.0)
Total hemoglobin: 11.6 g/dL — ABNORMAL LOW (ref 12.0–16.0)
Total hemoglobin: 11.3 g/dL — ABNORMAL LOW (ref 12.0–16.0)

## 2022-12-16 LAB — BASIC METABOLIC PANEL
Anion gap: 13 (ref 5–15)
BUN: 24 mg/dL — ABNORMAL HIGH (ref 8–23)
CO2: 25 mmol/L (ref 22–32)
Calcium: 9.8 mg/dL (ref 8.9–10.3)
Chloride: 102 mmol/L (ref 98–111)
Creatinine, Ser: 1.42 mg/dL — ABNORMAL HIGH (ref 0.44–1.00)
GFR, Estimated: 39 mL/min — ABNORMAL LOW (ref >60)
Glucose, Bld: 171 mg/dL — ABNORMAL HIGH (ref 70–99)
Potassium: 3.9 mmol/L (ref 3.5–5.1)
Sodium: 136 mmol/L (ref 135–145)

## 2022-12-16 LAB — GLUCOSE, CAPILLARY
Glucose-Capillary: 161 mg/dL — ABNORMAL HIGH (ref 70–99)
Glucose-Capillary: 166 mg/dL — ABNORMAL HIGH (ref 70–99)
Glucose-Capillary: 225 mg/dL — ABNORMAL HIGH (ref 70–99)
Glucose-Capillary: 182 mg/dL — ABNORMAL HIGH (ref 70–99)

## 2022-12-16 LAB — SURGICAL PCR SCREEN
MRSA, PCR: NEGATIVE
Staphylococcus aureus: POSITIVE — AB

## 2022-12-16 LAB — MAGNESIUM: Magnesium: 1.8 mg/dL (ref 1.7–2.4)

## 2022-12-16 LAB — PROTIME-INR
INR: 1.5 — ABNORMAL HIGH (ref 0.8–1.2)
Prothrombin Time: 18.1 s — ABNORMAL HIGH (ref 11.4–15.2)

## 2022-12-16 SURGERY — TRANSESOPHAGEAL ECHOCARDIOGRAM (TEE) (CATHLAB)
Anesthesia: Monitor Anesthesia Care

## 2022-12-16 MED ORDER — MUPIROCIN 2 % EX OINT
1.0000 | TOPICAL_OINTMENT | Freq: Two times a day (BID) | CUTANEOUS | Status: DC
  Administered 2022-12-16 – 2022-12-20 (×9): 1 via NASAL
  Filled 2022-12-16 (×3): qty 22

## 2022-12-16 MED ORDER — MILRINONE LACTATE IN DEXTROSE 20-5 MG/100ML-% IV SOLN
0.1250 ug/kg/min | INTRAVENOUS | Status: DC
  Administered 2022-12-16 – 2022-12-17 (×2): 0.125 ug/kg/min via INTRAVENOUS
  Filled 2022-12-16 (×3): qty 100

## 2022-12-16 NOTE — Progress Notes (Addendum)
Advanced Heart Failure Rounding Note  PCP-Cardiologist: Charlton Haws, MD   Subjective:   Yesterday diuresed with IV lasix. Negative 1.6 liters.   Remains on amio drip. In Atrial Tach.   CO-OX 51%, repeat   Denies SOB. Denies chest pain.    Objective:    Weight Range: 88.8 kg Body mass index is 31.6 kg/m.   Vital Signs:   Temp:  [97.5 F (36.4 C)-98.3 F (36.8 C)] 97.8 F (36.6 C) (12/12 0825) Pulse Rate:  [114-120] 117 (12/12 0800) Resp:  [17-31] 20 (12/12 0800) BP: (74-129)/(44-107) 129/107 (12/12 0800) SpO2:  [94 %-100 %] 99 % (12/12 0800) Weight:  [88.8 kg] 88.8 kg (12/12 0500) Last BM Date : 12/13/22 (per patient)  Weight change: Filed Weights   12/13/22 1356 12/14/22 1622 12/16/22 0500  Weight: 93 kg 92.3 kg 88.8 kg   Intake/Output:   Intake/Output Summary (Last 24 hours) at 12/16/2022 0830 Last data filed at 12/16/2022 0700 Gross per 24 hour  Intake 1282.84 ml  Output 2950 ml  Net -1667.16 ml   CVP 18 personally checked.  Physical Exam   General:   No resp difficulty HEENT: normal Neck: supple. JVP to jaw. Carotids 2+ bilat; no bruits. No lymphadenopathy or thryomegaly appreciated. Cor: PMI nondisplaced. Tachy Regular rate & rhythm. No rubs, gallops or murmurs. Lungs: clear Abdomen: soft, nontender, nondistended. No hepatosplenomegaly. No bruits or masses. Good bowel sounds. Extremities: no cyanosis, clubbing, rash, edema Neuro: alert & orientedx3, cranial nerves grossly intact. moves all 4 extremities w/o difficulty. Affect pleasant  Telemetry   Atrial Tach 110s  personally checked.   EKG   N/A  Labs    CBC Recent Labs    12/14/22 0301 12/15/22 0415  WBC 5.4 4.5  HGB 11.8* 11.3*  HCT 37.0 35.7*  MCV 88.7 89.0  PLT 213 203   Basic Metabolic Panel Recent Labs    18/84/16 0301 12/15/22 0415 12/16/22 0500  NA 138 133* 136  K 3.8 4.9 3.9  CL 104 103 102  CO2 25 24 25   GLUCOSE 134* 144* 171*  BUN 33* 26* 24*  CREATININE  1.51* 1.53* 1.42*  CALCIUM 10.3 9.7 9.8  MG 1.8  --  1.8  PHOS 3.3  --   --    Liver Function Tests No results for input(s): "AST", "ALT", "ALKPHOS", "BILITOT", "PROT", "ALBUMIN" in the last 72 hours. No results for input(s): "LIPASE", "AMYLASE" in the last 72 hours. Cardiac Enzymes No results for input(s): "CKTOTAL", "CKMB", "CKMBINDEX", "TROPONINI" in the last 72 hours.  BNP: BNP (last 3 results) Recent Labs    12/13/22 1420  BNP 1,062.6*    ProBNP (last 3 results) No results for input(s): "PROBNP" in the last 8760 hours.   D-Dimer Recent Labs    12/13/22 1420  DDIMER 6.05*   Hemoglobin A1C Recent Labs    12/14/22 0301  HGBA1C 6.7*   Fasting Lipid Panel Recent Labs    12/14/22 0301  CHOL 211*  HDL 51  LDLCALC 146*  TRIG 70  CHOLHDL 4.1   Thyroid Function Tests Recent Labs    12/14/22 0301  TSH 1.021   Other results:  Imaging   No results found.  Medications:    Scheduled Medications:  apixaban  5 mg Oral BID   aspirin EC  81 mg Oral Daily   atorvastatin  40 mg Oral Daily   Chlorhexidine Gluconate Cloth  6 each Topical Daily   furosemide  80 mg Intravenous BID  insulin aspart  0-6 Units Subcutaneous TID WC   mupirocin ointment  1 Application Nasal BID   sodium chloride flush  10-40 mL Intracatheter Q12H   sodium chloride flush  3 mL Intravenous Q12H    Infusions:  amiodarone 30 mg/hr (12/16/22 0700)    PRN Medications: acetaminophen, albuterol, mouth rinse, sodium chloride flush   Patient Profile   Shelley Douglas 72 y/o female w/ h/o chronic systolic heart failure dating back to 2009 due to ICM, CAD w/ remote inferior infarct w/ stenting to the RCA and subsequent CTO of RCA on repeat cath in 2016, s/p ICD w/ upgrade to CRT-D for LBBB, Afib/flutter, MV disease s/p remote MV annuloplasty in 1996, DVT/PE s/p IVC filter, on Eliquis, CVA, HTN, HLD, Type 2DM, CKD IIIa and b/l carpal tunnel s/p b/l CTR, admitted for a/c CHF in the  setting of atrial flutter w/ RVR   Assessment/Plan   1. Acute on Chronic Bventricular Heart Failure - Long standing since 2009. Ischemic CM. Also w/ LBBB s/p CRT-D  - Last echo 11/23 EF 30-35%, global HK, mild LVH, RV mildly reduced - admitted w/ NYHA Class IIIb symptoms and volume overload, in setting of dietary and medication noncompliance. This is further c/b rapid AFL.  - w/ h/o b/l carpal tunnel, consider w/u for TTR amyloid w/ PYP scan. -- GDMT PTA, only Coreg 25 mg bid per med rec. Had previously been on [place], Entresto and digoxin. Not clear when/why meds were stopped - Echo EF 20-25% RV  severely reduced , mod mitral stenosis.  - CVP 18. Continue IV lasix 80 mg twice a day.  -  Hold coreg for now given risk for low output - Hold Entresto and Digoxin for now.  - Will need TEE/DCCV but needs another day to diurese.    2. AFL w/ RVR - h/o Afib/Flutter, on Eliquis - in AFL x 4 days per device interrogation. Suspect CHF triggered arrhythmia as HF symptoms present x 2 wks - Continue Amio drip. On Eliquis 5 mg twice a day.  - Hold TEE/DCCV until better diuresed. (missed doses of Eliquis in last month)  - Recommend out patient sleep study    3. AKI on CKD IIIa - B/l SCr 1.1 - Admit SCr 1.95 - Stable today at 1.4  - Follow daily    4. H/o PE/DVT - s/p IVC filter. Historically poor compliance with Eliquis. Continue Eliquis.  - D-dimer elevated, 6.05 - V/Q scan low likelihood for PE    5. LBBB - s/p CRT-D - only 90% Bi-V pacing, hopefully will improve once back in NSR    6. CAD - remote inferior infarct, BMS prox and mid RCA  - known CTO/ISR of RCA by cath 2016, treated medically  - HS Trop - continue medical management    7. Mitral Valve Disorder - s/p MV annuloplasty in 1996 - Echo 11/23 w/ mild MS, mG 4 mmHg -Mod mitral  stenosis. Mean gradient 6.7 - Will need TEE to further assess once further diuresed.    8. Suspected OSA - h/o snoring  - recommend out  patient sleep study   Length of Stay: 3  Amy Clegg, NP  12/16/2022, 8:30 AM  Advanced Heart Failure Team Pager [phone] (M-F; 7a - 5p)  Please contact [facility] for night-coverage after hours (5p -7a ) and weekends on [email or web address]  Agree with above  Remains in AFL with RVR despite amio. CVP remains elevated. Co-ox low at 50% Denies CP or SOB.  General: Sitting up in bed  No resp difficulty HEENT: normal Neck: supple. JVP to ear  Carotids 2+ bilat; no bruits. No lymphadenopathy or thryomegaly appreciated. Cor: Irregular rate & rhythm. 2/6 TR Lungs: clear Abdomen: soft, nontender, nondistended. No hepatosplenomegaly. No bruits or masses. Good bowel sounds. Extremities: no cyanosis, clubbing, rash, 2+ edema Neuro: alert & orientedx3, cranial nerves grossly intact. moves all 4 extremities w/o difficulty. Affect pleasant  Remains tenuous with biventricular failure. Low co-ox with volume overload.   Will add milrinone. Continue diuresis. Continue IV amio and Eliquis.   Push TEE/DC-CV out until tomorrow.   CRITICAL CARE Performed by: Arvilla Meres  Total critical care time: 45 minutes  Critical care time was exclusive of separately billable procedures and treating other patients.  Critical care was necessary to treat or prevent imminent or life-threatening deterioration.  Critical care was time spent personally by me (independent of midlevel providers or residents) on the following activities: development of treatment plan with patient and/or surrogate as well as nursing, discussions with consultants, evaluation of patient's response to treatment, examination of patient, obtaining history from patient or surrogate, ordering and performing treatments and interventions, ordering and review of laboratory studies, ordering and review of radiographic studies, pulse oximetry and re-evaluation of patient's condition.  Arvilla Meres, MD  3:12 PM

## 2022-12-16 NOTE — Plan of Care (Signed)

## 2022-12-16 NOTE — Progress Notes (Signed)
   Orders placed for TEE and Cardioversion for 12/17/22  Antoin Dargis NP-C   2:50 PM

## 2022-12-16 NOTE — TOC Initial Note (Signed)
Transition of Care [facility]) - Initial/Assessment Note    Patient Details  Name: Shelley Douglas MRN: [id number] Date of Birth: 1950-12-24  [facility] CM/SW Contact:    Nicanor Bake Phone Number: [phone] 12/16/2022, 1:02 PM  Clinical Narrative:  [facility] CSW met with pt at bedside. Pt stated that she lives alone with spouse. Pt stated that she has no history of HH services. Pt stated that she uses a cane. Pt stated that she has a scale at home. Pt stated that she would like to have a HHRN/nurse aide come to the home to assist with ADLs. Pt also inquired about a walking rollator being ordered. CSW explained that a hospital follow up appointment will be scheduled closer to dc. Pt agrees.   TOC will continue following.                 Expected Discharge Plan: Home/Self Care Barriers to Discharge: Continued Medical Work up   Patient Goals and CMS Choice Patient states their goals for this hospitalization and ongoing recovery are:: "get to feeling better"          Expected Discharge Plan and Services       Living arrangements for the past 2 months: Mobile Home                                      Prior Living Arrangements/Services Living arrangements for the past 2 months: Mobile Home Lives with:: Spouse Patient language and need for interpreter reviewed:: Yes Do you feel safe going back to the place where you live?: Yes      Need for Family Participation in Patient Care: No (Comment) Care giver support system in place?: Yes (comment)   Criminal Activity/Legal Involvement Pertinent to Current Situation/Hospitalization: No - Comment as needed  Activities of Daily Living   ADL Screening (condition at time of admission) Independently performs ADLs?: No Does the patient have a NEW difficulty with bathing/dressing/toileting/self-feeding that is expected to last >3 days?: No Does the patient have a NEW difficulty with getting in/out of bed,  walking, or climbing stairs that is expected to last >3 days?: No Does the patient have a NEW difficulty with communication that is expected to last >3 days?: No Is the patient deaf or have difficulty hearing?: No Does the patient have difficulty seeing, even when wearing glasses/contacts?: Yes Does the patient have difficulty concentrating, remembering, or making decisions?: Yes  Permission Sought/Granted                  Emotional Assessment Appearance:: Appears stated age Attitude/Demeanor/Rapport: Engaged Affect (typically observed): Appropriate Orientation: : Oriented to Self, Oriented to Place, Oriented to Situation Alcohol / Substance Use: Not Applicable Psych Involvement: No (comment)  Admission diagnosis:  Palpitations [R00.2] Shortness of breath [R06.02] Atrial flutter with rapid ventricular response (HCC) [I48.92] AKI (acute kidney injury) (HCC) [N17.9] Atrial fibrillation with RVR (HCC) [I48.91] Acute on chronic congestive heart failure, unspecified heart failure type (HCC) [I50.9] Patient Active Problem List   Diagnosis Date Noted   Atrial flutter with rapid ventricular response (HCC) 12/13/2022   Elevated d-dimer 12/13/2022   Chest pain 12/13/2022   Acute kidney injury superimposed on chronic kidney disease (HCC) 12/13/2022   Cerebral thrombosis with cerebral infarction 01/14/2021   Stroke-like symptoms with dizziness, visual deficits and difficulty speaking  01/13/2021   Type 2 diabetes mellitus with hyperlipidemia (HCC)  01/13/2021   Essential hypertension 01/13/2021   CAD (coronary artery disease)/HLD 01/13/2021   Hypercalcemia 01/13/2021   Acute on chronic systolic CHF (congestive heart failure) (HCC) 05/13/2016   Chronic systolic dysfunction of left ventricle 07/01/2015   Dizziness 09/24/2014   Ischemic cardiomyopathy 08/07/2010   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 [place] [address] Phone: [phone] Fax: [phone]     Social Drivers of Health (SDOH) Social History: SDOH Screenings   Food Insecurity: No Food Insecurity (12/14/2022)  Housing: High Risk (12/14/2022)  Transportation Needs: No Transportation Needs (12/14/2022)  Utilities: Not At Risk (12/14/2022)  Tobacco Use: Medium Risk (12/13/2022)   SDOH Interventions:     Readmission Risk Interventions     No data to display

## 2022-12-16 NOTE — Progress Notes (Signed)
Rounding Note    Patient Name: Shelley Douglas Date of Encounter: 12/16/2022  [facility] Cardiologist: Charlton Haws, MD   Subjective   She feels ok today, tired  Inpatient Medications    Scheduled Meds:  apixaban  5 mg Oral BID   aspirin EC  81 mg Oral Daily   atorvastatin  40 mg Oral Daily   Chlorhexidine Gluconate Cloth  6 each Topical Daily   furosemide  80 mg Intravenous BID   insulin aspart  0-6 Units Subcutaneous TID WC   mupirocin ointment  1 Application Nasal BID   sodium chloride flush  10-40 mL Intracatheter Q12H   sodium chloride flush  3 mL Intravenous Q12H   Continuous Infusions:  amiodarone 30 mg/hr (12/16/22 0700)   PRN Meds: acetaminophen, albuterol, mouth rinse, sodium chloride flush   Vital Signs    Vitals:   12/16/22 0400 12/16/22 0500 12/16/22 0800 12/16/22 0825  BP: 124/81  (!) 129/107   Pulse: (!) 117 (!) 118 (!) 117   Resp: 20 19 20    Temp:    97.8 F (36.6 C)  TempSrc:    Oral  SpO2: 97% 97% 99%   Weight:  88.8 kg    Height:        Intake/Output Summary (Last 24 hours) at 12/16/2022 0921 Last data filed at 12/16/2022 0700 Gross per 24 hour  Intake 1012.84 ml  Output 2950 ml  Net -1937.16 ml      12/16/2022    5:00 AM 12/14/2022    4:22 PM 12/13/2022    1:56 PM  Last 3 Weights  Weight (lbs) 195 lb 12.3 oz 203 lb 7.8 oz 205 lb  Weight (kg) 88.8 kg 92.3 kg 92.987 kg      Telemetry    Flutter, v paced - Personally Reviewed  ECG    No new - Personally Reviewed  Physical Exam   Vitals:   12/16/22 0800 12/16/22 0825  BP: (!) 129/107   Pulse: (!) 117   Resp: 20   Temp:  97.8 F (36.6 C)  SpO2: 99%     GEN: No acute distress.  Lying flat  Neck: + JVD Cardiac: IRRR, no murmurs, rubs, or gallops.  Respiratory: nl wob, decreased BS GI: Soft, nontender, non-distended  MS: No edema; No deformity. Neuro:  Nonfocal  Psych: Normal affect   Labs    High Sensitivity Troponin:   Recent Labs  Lab  12/13/22 1420 12/13/22 1648 12/14/22 0301  TROPONINIHS 32* 36* 36*     Chemistry Recent Labs  Lab 12/14/22 0301 12/15/22 0415 12/16/22 0500  NA 138 133* 136  K 3.8 4.9 3.9  CL 104 103 102  CO2 25 24 25   GLUCOSE 134* 144* 171*  BUN 33* 26* 24*  CREATININE 1.51* 1.53* 1.42*  CALCIUM 10.3 9.7 9.8  MG 1.8  --  1.8  GFRNONAA 37* 36* 39*  ANIONGAP 9 6 13     Lipids  Recent Labs  Lab 12/14/22 0301  CHOL 211*  TRIG 70  HDL 51  LDLCALC 146*  CHOLHDL 4.1    Hematology Recent Labs  Lab 12/13/22 1420 12/14/22 0301 12/15/22 0415  WBC 5.8 5.4 4.5  RBC 4.46 4.17 4.01  HGB 12.6 11.8* 11.3*  HCT 39.8 37.0 35.7*  MCV 89.2 88.7 89.0  MCH 28.3 28.3 28.2  MCHC 31.7 31.9 31.7  RDW 15.9* 16.3* 16.5*  PLT 222 213 203   Thyroid  Recent Labs  Lab 12/14/22 0301  TSH 1.021    BNP Recent Labs  Lab 12/13/22 1420  BNP 1,062.6*    DDimer  Recent Labs  Lab 12/13/22 1420  DDIMER 6.05*     Radiology    VAS [place] LOWER EXTREMITY VENOUS (DVT) Result Date: 12/15/2022  Lower Venous DVT Study Patient Name:  Shelley Douglas  Date of Exam:   12/14/2022 Medical Rec #: [id number]           Accession #:    [id number] Date of Birth: 1950/03/28           Patient Gender: F Patient Age:   72 years Exam Location:  [facility] Procedure:      VAS [place] LOWER EXTREMITY VENOUS (DVT) Referring Phys: Dolly Rias --------------------------------------------------------------------------------  Indications: Swelling.  Risk Factors: Hx of DVT & PE. Anticoagulation: Eliquis. Limitations: Body habitus and poor ultrasound/tissue interface. Comparison Study: Previous exam on 09/25/2014 was positive for BLE chronic DVT. Performing Technologist: Ernestene Mention RVT, RDMS  Examination Guidelines: A complete evaluation includes B-mode imaging, spectral Doppler, color Doppler, and power Doppler as needed of all accessible portions of each vessel. Bilateral testing is considered an integral part of a complete  examination. Limited examinations for reoccurring indications may be performed as noted. The reflux portion of the exam is performed with the patient in reverse Trendelenburg.  +---------+---------------+---------+-----------+----------+--------------+ RIGHT    CompressibilityPhasicitySpontaneityPropertiesThrombus Aging +---------+---------------+---------+-----------+----------+--------------+ CFV      Full           No       Yes                                 +---------+---------------+---------+-----------+----------+--------------+ SFJ      Full                                                        +---------+---------------+---------+-----------+----------+--------------+ FV Prox  Full           No       Yes                                 +---------+---------------+---------+-----------+----------+--------------+ FV Mid   Full           No       Yes                                 +---------+---------------+---------+-----------+----------+--------------+ FV DistalFull           No       Yes                                 +---------+---------------+---------+-----------+----------+--------------+ PFV      Full                                                        +---------+---------------+---------+-----------+----------+--------------+ POP      Partial  No       Yes                  Chronic        +---------+---------------+---------+-----------+----------+--------------+ PTV      Full                                                        +---------+---------------+---------+-----------+----------+--------------+ PERO     Full                                                        +---------+---------------+---------+-----------+----------+--------------+ Abnormal waveforms noted - likely due to elevated right heart pressure / CHF  +---------+---------------+---------+-----------+----------+--------------+ LEFT      CompressibilityPhasicitySpontaneityPropertiesThrombus Aging +---------+---------------+---------+-----------+----------+--------------+ CFV      Full           No       Yes                                 +---------+---------------+---------+-----------+----------+--------------+ SFJ      Full                                                        +---------+---------------+---------+-----------+----------+--------------+ FV Prox  Full           No       Yes                                 +---------+---------------+---------+-----------+----------+--------------+ FV Mid   Full           No       Yes                                 +---------+---------------+---------+-----------+----------+--------------+ FV DistalFull           No       Yes                                 +---------+---------------+---------+-----------+----------+--------------+ PFV      Full                                                        +---------+---------------+---------+-----------+----------+--------------+ POP      Full           No       Yes                                 +---------+---------------+---------+-----------+----------+--------------+ PTV      Full                                                        +---------+---------------+---------+-----------+----------+--------------+  PERO     Full                                                        +---------+---------------+---------+-----------+----------+--------------+ Abnormal waveforms noted - likely due to elevated right heart pressure / CHF    Summary: BILATERAL: -No evidence of popliteal cyst, bilaterally. 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  LEFT: - There is no evidence of deep vein thrombosis in the lower extremity.  *See table(s) above for measurements and observations. Electronically signed by Coral Else MD on 12/15/2022 at  9:05:37 PM.    Final    NM Pulmonary Perfusion Result Date: 12/14/2022 CLINICAL DATA:  Acute on chronic systolic heart failure, PE suspected EXAM: NUCLEAR MEDICINE PERFUSION LUNG SCAN TECHNIQUE: Perfusion images were obtained in multiple projections after intravenous injection of radiopharmaceutical. Ventilation scans intentionally deferred if perfusion scan and chest x-ray adequate for interpretation during COVID 19 epidemic. RADIOPHARMACEUTICALS:  4.4 mCi Tc-55m MAA IV COMPARISON:  Radiograph 12/13/2022 FINDINGS: Heterogenous distribution of radiopharmaceutical with no segmental or subsegmental unexplained defects to suggest acute PE. Left anterior defect from AICD generator noted. IMPRESSION: Low likelihood ratio for pulmonary embolus. Electronically Signed   By: Corlis Leak M.D.   On: 12/14/2022 15:37   [place] EKG SITE RITE Result Date: 12/14/2022 If Site Rite image not attached, placement could not be confirmed due to current cardiac rhythm.  ECHOCARDIOGRAM COMPLETE Result Date: 12/14/2022    ECHOCARDIOGRAM REPORT   Patient Name:   Shelley Douglas Date of Exam: 12/14/2022 Medical Rec #:  [id number]          Height:       66.0 in Accession #:    [id number]         Weight:       205.0 lb Date of Birth:  May 14, 1950          BSA:          2.021 m Patient Age:    72 years           BP:           128/81 mmHg Patient Gender: F                  HR:           73 bpm. Exam Location:  Inpatient Procedure: 2D Echo, Color Doppler and Cardiac Doppler Indications:    I50.9* Heart failure (unspecified)  History:        Patient has prior history of Echocardiogram examinations, most                 recent 11/17/2021. CHF, Pacemaker and Defibrillator,                 Arrythmias:Atrial Flutter and Atrial Fibrillation; Risk                 Factors:Hypertension and Diabetes. MV Annuloplasty prior to                 2013.                  Mitral Valve: prosthetic annuloplasty ring valve is present in                 the  mitral  position. Procedure Date: prior to 2013.  Sonographer:    Irving Burton Senior RDCS Referring Phys: [id number] Dolly Rias IMPRESSIONS  1. Left ventricular ejection fraction, by estimation, is 20 to 25%. Left ventricular ejection fraction by 2D MOD biplane is 22.3 %. The left ventricle has severely decreased function. The left ventricle demonstrates global hypokinesis. The left ventricular internal cavity size was severely dilated. There is mild concentric left ventricular hypertrophy. Left ventricular diastolic function could not be evaluated.  2. Right ventricular systolic function is severely reduced. The right ventricular size is moderately enlarged. There is moderately elevated pulmonary artery systolic pressure. The estimated right ventricular systolic pressure is 48.9 mmHg.  3. Left atrial size was severely dilated.  4. Right atrial size was severely dilated.  5. The mitral valve has been repaired/replaced. Moderate to severe mitral valve regurgitation. Moderate mitral stenosis. The mean mitral valve gradient is 6.7 mmHg with average heart rate of 68 bpm. There is a prosthetic annuloplasty ring present in the  mitral position. Procedure Date: prior to 2013.  6. The tricuspid valve is abnormal. Tricuspid valve regurgitation is moderate to severe.  7. The aortic valve has an indeterminant number of cusps. Aortic valve regurgitation is not visualized. No aortic stenosis is present.  8. The inferior vena cava is dilated in size with <50% respiratory variability, suggesting right atrial pressure of 15 mmHg. Comparison(s): Prior images reviewed side by side. The left ventricular function is worsened. The right ventricular systolic function is worse. Mitral and tricuspid insufficiency ar worse. Right atrail pressure and pulmonary artery pressure are higher. FINDINGS  Left Ventricle: Left ventricular ejection fraction, by estimation, is 20 to 25%. Left ventricular ejection fraction by 2D MOD biplane is 22.3 %. The  left ventricle has severely decreased function. The left ventricle demonstrates global hypokinesis. The left ventricular internal cavity size was severely dilated. There is mild concentric left ventricular hypertrophy. Abnormal (paradoxical) septal motion consistent with post-operative status. Left ventricular diastolic function could not be evaluated due to mitral valve repair. Left ventricular diastolic function could not be evaluated. Right Ventricle: The right ventricular size is moderately enlarged. No increase in right ventricular wall thickness. Right ventricular systolic function is severely reduced. There is moderately elevated pulmonary artery systolic pressure. The tricuspid regurgitant velocity is 2.91 m/s, and with an assumed right atrial pressure of 15 mmHg, the estimated right ventricular systolic pressure is 48.9 mmHg. Left Atrium: Left atrial size was severely dilated. Right Atrium: Right atrial size was severely dilated. Pericardium: Trivial pericardial effusion is present. Mitral Valve: The mitral valve has been repaired/replaced. Moderate to severe mitral valve regurgitation. There is a prosthetic annuloplasty ring present in the mitral position. Procedure Date: prior to 2013. Moderate mitral valve stenosis. MV peak gradient, 14.6 mmHg. The mean mitral valve gradient is 6.7 mmHg with average heart rate of 68 bpm. Tricuspid Valve: The tricuspid valve is abnormal. Tricuspid valve regurgitation is moderate to severe. The flow in the hepatic veins is reversed during ventricular systole. Aortic Valve: The aortic valve has an indeterminant number of cusps. Aortic valve regurgitation is not visualized. No aortic stenosis is present. Pulmonic Valve: The pulmonic valve was normal in structure. Pulmonic valve regurgitation is trivial. No evidence of pulmonic stenosis. Aorta: The aortic root and ascending aorta are structurally normal, with no evidence of dilitation. Venous: The inferior vena cava is  dilated in size with less than 50% respiratory variability, suggesting right atrial pressure of 15 mmHg. The inferior vena cava and the hepatic vein show  a pattern of systolic flow reversal, suggestive of tricuspid regurgitation. IAS/Shunts: No atrial level shunt detected by color flow Doppler. Additional Comments: A device lead is visualized in the right atrium and right ventricle.  LEFT VENTRICLE PLAX 2D                        Biplane EF (MOD) LVIDd:         4.90 cm         LV Biplane EF:   Left LVIDs:         4.70 cm                          ventricular LV PW:         1.14 cm                          ejection LV IVS:        1.30 cm                          fraction by LVOT diam:     2.00 cm                          2D MOD LV SV:         39                               biplane is LV SV Index:   19                               22.3 %. LVOT Area:     3.14 cm  LV Volumes (MOD) LV vol d, MOD    150.0 ml A2C: LV vol d, MOD    122.5 ml A4C: LV vol s, MOD    114.0 ml A2C: LV vol s, MOD    101.7 ml A4C: LV SV MOD A2C:   36.0 ml LV SV MOD A4C:   122.5 ml LV SV MOD BP:    31.0 ml RIGHT VENTRICLE RV S prime:     8.05 cm/s TAPSE (M-mode): 1.0 cm LEFT ATRIUM             Index        RIGHT ATRIUM           Index LA diam:        4.40 cm 2.18 cm/m   RA Area:     35.75 cm LA Vol (A2C):   77.1 ml 38.15 ml/m  RA Volume:   149.00 ml 73.73 ml/m LA Vol (A4C):   77.2 ml 38.20 ml/m LA Biplane Vol: 78.3 ml 38.75 ml/m  AORTIC VALVE LVOT Vmax:   78.97 cm/s LVOT Vmean:  55.867 cm/s LVOT VTI:    0.123 m  AORTA Ao Root diam: 3.00 cm Ao Asc diam:  3.10 cm MITRAL VALVE                  TRICUSPID VALVE MV Area VTI:  1.01 cm        TR Peak grad:   33.9 mmHg MV Peak grad: 14.6 mmHg       TR Vmax:        291.00 cm/s  MV Mean grad: 6.7 mmHg MV Vmax:      1.91 m/s        SHUNTS MV Vmean:     112.0 cm/s      Systemic VTI:  0.12 m MR Peak grad:    62.1 mmHg    Systemic Diam: 2.00 cm MR Mean grad:    45.7 mmHg MR Vmax:         394.00 cm/s MR  Vmean:        292.7 cm/s MR PISA:         2.26 cm MR PISA Eff ROA: 22 mm MR PISA Radius:  0.60 cm Mihai Croitoru MD Electronically signed by Thurmon Fair MD Signature Date/Time: 12/14/2022/1:41:27 PM    Final     Cardiac Studies   Per below  Patient Profile     Teola Bradley 72 y/o female w/ h/o chronic systolic heart failure dating back to 2009 due to ICM, CAD w/ remote inferior infarct w/ stenting to the RCA and subsequent CTO of RCA on repeat cath in 2016, s/p ICD w/ upgrade to CRT-D for LBBB, Afib/flutter, MV disease s/p remote MV annuloplasty in 1996, DVT/PE s/p IVC filter, on Eliquis, CVA, HTN, HLD, Type 2DM, CKD IIIa and b/l carpal tunnel s/p b/l CTR, admitted for a/c CHF in the setting of atrial flutter w/ RVR   Assessment & Plan    1. Acute on Chronic Bventricular Heart Failure - Long standing since 2009. Ischemic CM. Also w/ LBBB s/p CRT-D  - Last echo 11/23 EF 30-35%, global HK, mild LVH, RV mildly reduced - admitted w/ NYHA Class IIIb symptoms and volume overload, in setting of dietary and medication noncompliance. This is further c/b rapid AFL.  - she is being managed by CHF, no changes from their recommendations, continue diuresis.  Crt has improved.  -Co-Ox 50.9, plan is to continue to follow, no plans for inotropes at this point -tentative plan for TEE/DCCV for Friday  2. AFL w/ RVR - h/o Afib/Flutter, on Eliquis - Continue Amio gtt. On Eliquis 5 mg twice a day.  - Plan for TEE/DCCV once better diuresed. (missed doses of Eliquis in last month)  tentative Friday - Recommend out patient sleep study    3. AKI on CKD IIIa - B/l SCr 1.1 - Admit SCr 1.95 - Stable today at 1.4  - Follow daily    4. H/o PE/DVT - s/p IVC filter. Historically poor compliance with Eliquis. Continue Eliquis.  - D-dimer elevated, 6.05 - V/Q scan low likelihood for PE    5. LBBB - s/p CRT-D - only 90% Bi-V pacing, hopefully will improve once back in NSR    6. CAD - remote  inferior infarct, BMS prox and mid RCA  - known CTO/ISR of RCA by cath 2016, treated medically  - HS Trop - continue medical management    7. Mitral Valve Disorder - s/p MV annuloplasty in 1996 - Echo 11/23 w/ mild MS, mG 4 mmHg -had mild increase in gradient. Mean gradient 6.7  Will defer to heart failure for her care this week; A will take over on the weekend  For questions or updates, please contact [facility] Please consult [email or web address] for contact info under        Signed, Maisie Fus, MD  12/16/2022, 9:21 AM

## 2022-12-16 NOTE — Plan of Care (Signed)

## 2022-12-16 NOTE — Progress Notes (Signed)
Progress Note   Patient: Shelley Douglas:[id number] DOB: [birth year] DOA: 12/13/2022     3 DOS: the patient was seen and examined on 12/16/2022   Brief hospital course: Shelley Douglas was admitted to the hospital with the working diagnosis of atrial fibrillation with rapid ventricular response.   72 yo female with the past medical history of heart failure, atrial fibrillation, ventricular tachycardia, coronary artery disease, hypertension, dyslipidemia, CVA, T2DM, pulmonary embolism and DVT who presented with dyspnea and palpitations. Reported 2 days of progressive dyspnea, associated with exertional chest pressure, palpitations, orthopnea and lower extremity edema. Apparently she has been not adherent to her medications at home. On her initial physical examination her blood pressure was 125/101, HR 132, RR 21 and 02 saturation 90%, lungs with no wheezing or rales, no rhonchi, heart with S1 and S2 present, irregular with no gallops, or murmurs, abdomen with no distention and positive lower extremity edema.   EKG 129 bpm, right axis deviation, normal intervals, atrial flutter rhythm with one PVC, poor R R wave progression with no significant ST segment or  T wave changes.    Patient was placed on amiodarone drip for rate control.  12/11 plan for TEE cardioversion when improved volume status.  12/12 continue volume overloaded.      Assessment and Plan: * Atrial flutter with rapid ventricular response (HCC) Patient has been placed on amiodarone drip for rate control. Anticoagulation with apixaban  Continue carvedilol   Plan for direct current cardioversion when better volume status.  Continue telemetry monitoring   Acute on chronic systolic CHF (congestive heart failure) (HCC) Echocardiogram with reduced LV systolic function with EF 20 to 25 %, global hypokinesis, LV cavity with severe dilatation, mild LVH, RV systolic function with severe reduction, RV cavity with moderate  enlargement, RVSP 48.9 mmHg, LA and RA with severe dilatation, moderate to severe mitral valve regurgitation (it has been replaced/repair), moderate to severe tricuspid regurgitation   Urine output is 2,950  Systolic blood pressure is 115 mmHg.  Plan to continue diuresis with furosemide 80 mg IV q 12 hrs Added milrinone for inotropic support.  Limited pharmacologic therapy due to risk of hypotension.   Acute kidney injury superimposed on chronic kidney disease (HCC) Hyponatremia CKD stage 2   Renal function with serum cr at 1,42 with K at 3,9 and serum bicarbonate at 25.  Na 136 Mg 1,8   Continue diuresis with furosemide Added milrinone today Follow up renal function and electrolytes.   CAD (coronary artery disease)/HLD No chest pain,   Essential hypertension Continue close blood pressure monitoring   Cerebral thrombosis with cerebral infarction Continue anticoagulation with apixaban and continue with statin.   Type 2 diabetes mellitus with hyperlipidemia (HCC) Continue glucose cover and monitoring with insulin  Continue with statin therapy.        Subjective: Patient continue to have dyspnea, not back to her baseline   Physical Exam: Vitals:   12/16/22 1000 12/16/22 1138 12/16/22 1538 12/16/22 1543  BP: 110/80   130/81  Pulse: (!) 117   (!) 119  Resp: 18   16  Temp:  97.6 F (36.4 C) 98 F (36.7 C)   TempSrc:  Oral Oral   SpO2: 98%   93%  Weight:      Height:       Neurology awake and alert ENT with mild pallor Cardiovascular with S1 and S2 present with positive systolic murmur at the apex,  Positive JVD Positive lower extremity edema +  Respiratory with rales bilaterally with no wheezing Abdomen with no distention  Data Reviewed:    Family Communication: no family at the bedside   Disposition: Status is: Inpatient Remains inpatient appropriate because: IV diuresis and IV inotropic support   Planned Discharge Destination:  Home     Author: Coralie Keens, MD 12/16/2022 5:24 PM  For on call review [email or web address].

## 2022-12-16 NOTE — Progress Notes (Signed)
Remains in Atrial Tach on amio 30mg  per hour.   CO-OX 50%. Repeat CO-OX 52%.   Add milrinone 0.125 mcg . Repeat CO-OX this afternoon.    Jandiel Magallanes NP-C  11:53 AM

## 2022-12-17 ENCOUNTER — Encounter (HOSPITAL_COMMUNITY)
Admission: EM | Disposition: A | Discharge status: Home Health | Payer: Self-pay | Source: Home / Self Care | Attending: Internal Medicine

## 2022-12-17 ENCOUNTER — Inpatient Hospital Stay (HOSPITAL_COMMUNITY): Payer: Medicare Other | Admitting: Anesthesiology

## 2022-12-17 ENCOUNTER — Other Ambulatory Visit: Payer: Self-pay

## 2022-12-17 ENCOUNTER — Inpatient Hospital Stay (HOSPITAL_COMMUNITY): Payer: Medicare Other

## 2022-12-17 DIAGNOSIS — N179 Acute kidney failure, unspecified: Secondary | ICD-10-CM | POA: Diagnosis not present

## 2022-12-17 DIAGNOSIS — I4891 Unspecified atrial fibrillation: Secondary | ICD-10-CM

## 2022-12-17 DIAGNOSIS — I5023 Acute on chronic systolic (congestive) heart failure: Secondary | ICD-10-CM | POA: Diagnosis not present

## 2022-12-17 DIAGNOSIS — I4892 Unspecified atrial flutter: Secondary | ICD-10-CM | POA: Diagnosis not present

## 2022-12-17 DIAGNOSIS — I361 Nonrheumatic tricuspid (valve) insufficiency: Secondary | ICD-10-CM

## 2022-12-17 DIAGNOSIS — I10 Essential (primary) hypertension: Secondary | ICD-10-CM | POA: Diagnosis not present

## 2022-12-17 DIAGNOSIS — E66811 Obesity, class 1: Secondary | ICD-10-CM

## 2022-12-17 DIAGNOSIS — I342 Nonrheumatic mitral (valve) stenosis: Secondary | ICD-10-CM

## 2022-12-17 HISTORY — PX: TRANSESOPHAGEAL ECHOCARDIOGRAM (CATH LAB): EP1270

## 2022-12-17 HISTORY — PX: CARDIOVERSION: EP1203

## 2022-12-17 LAB — BASIC METABOLIC PANEL
Anion gap: 9 (ref 5–15)
BUN: 18 mg/dL (ref 8–23)
CO2: 26 mmol/L (ref 22–32)
Calcium: 9 mg/dL (ref 8.9–10.3)
Chloride: 99 mmol/L (ref 98–111)
Creatinine, Ser: 1.17 mg/dL — ABNORMAL HIGH (ref 0.44–1.00)
GFR, Estimated: 50 mL/min — ABNORMAL LOW (ref >60)
Glucose, Bld: 246 mg/dL — ABNORMAL HIGH (ref 70–99)
Potassium: 3.4 mmol/L — ABNORMAL LOW (ref 3.5–5.1)
Sodium: 134 mmol/L — ABNORMAL LOW (ref 135–145)

## 2022-12-17 LAB — GLUCOSE, CAPILLARY
Glucose-Capillary: 172 mg/dL — ABNORMAL HIGH (ref 70–99)
Glucose-Capillary: 161 mg/dL — ABNORMAL HIGH (ref 70–99)
Glucose-Capillary: 173 mg/dL — ABNORMAL HIGH (ref 70–99)
Glucose-Capillary: 179 mg/dL — ABNORMAL HIGH (ref 70–99)
Glucose-Capillary: 202 mg/dL — ABNORMAL HIGH (ref 70–99)

## 2022-12-17 LAB — COOXEMETRY PANEL
Carboxyhemoglobin: 1.5 % (ref 0.5–1.5)
Carboxyhemoglobin: 1.7 % — ABNORMAL HIGH (ref 0.5–1.5)
Methemoglobin: <0.7 % (ref 0.0–1.5)
Methemoglobin: <0.7 % (ref 0.0–1.5)
O2 Saturation: 65.9 %
O2 Saturation: 71.1 %
Total hemoglobin: 11.3 g/dL — ABNORMAL LOW (ref 12.0–16.0)
Total hemoglobin: 11.4 g/dL — ABNORMAL LOW (ref 12.0–16.0)

## 2022-12-17 LAB — MAGNESIUM: Magnesium: 1.8 mg/dL (ref 1.7–2.4)

## 2022-12-17 SURGERY — TRANSESOPHAGEAL ECHOCARDIOGRAM (TEE) (CATHLAB)
Anesthesia: Monitor Anesthesia Care

## 2022-12-17 MED ORDER — SPIRONOLACTONE 12.5 MG HALF TABLET
12.5000 mg | ORAL_TABLET | Freq: Every day | ORAL | Status: DC
  Administered 2022-12-17 – 2022-12-20 (×4): 12.5 mg via ORAL
  Filled 2022-12-17 (×5): qty 1

## 2022-12-17 MED ORDER — PHENYLEPHRINE 80 MCG/ML (10ML) SYRINGE FOR IV PUSH (FOR BLOOD PRESSURE SUPPORT)
PREFILLED_SYRINGE | INTRAVENOUS | Status: DC | PRN
  Administered 2022-12-17: 80 ug via INTRAVENOUS

## 2022-12-17 MED ORDER — POTASSIUM CHLORIDE CRYS ER 20 MEQ PO TBCR: 40.0000 meq | EXTENDED_RELEASE_TABLET | Freq: Two times a day (BID) | ORAL | Status: DC

## 2022-12-17 MED ORDER — DIGOXIN 125 MCG PO TABS
0.1250 mg | ORAL_TABLET | Freq: Every day | ORAL | Status: DC
  Administered 2022-12-17 – 2022-12-20 (×4): 0.125 mg via ORAL
  Filled 2022-12-17 (×5): qty 1

## 2022-12-17 MED ORDER — POTASSIUM CHLORIDE CRYS ER 20 MEQ PO TBCR
40.0000 meq | EXTENDED_RELEASE_TABLET | ORAL | Status: AC
  Administered 2022-12-17 (×2): 40 meq via ORAL
  Filled 2022-12-17 (×2): qty 2

## 2022-12-17 MED ORDER — POTASSIUM CHLORIDE CRYS ER 20 MEQ PO TBCR
40.0000 meq | EXTENDED_RELEASE_TABLET | Freq: Two times a day (BID) | ORAL | Status: DC
  Administered 2022-12-18 – 2022-12-19 (×3): 40 meq via ORAL
  Filled 2022-12-17 (×3): qty 2

## 2022-12-17 MED ORDER — PROPOFOL 10 MG/ML IV BOLUS
INTRAVENOUS | Status: DC | PRN
  Administered 2022-12-17: 20 mg via INTRAVENOUS
  Administered 2022-12-17: 30 mg via INTRAVENOUS

## 2022-12-17 MED ORDER — LIDOCAINE 2% (20 MG/ML) 5 ML SYRINGE
INTRAMUSCULAR | Status: DC | PRN
  Administered 2022-12-17 (×2): 50 mg via INTRAVENOUS

## 2022-12-17 MED ORDER — APIXABAN 5 MG PO TABS
ORAL_TABLET | ORAL | Status: AC
  Filled 2022-12-17: qty 1

## 2022-12-17 MED ORDER — PROPOFOL 500 MG/50ML IV EMUL
INTRAVENOUS | Status: DC | PRN
  Administered 2022-12-17: 75 ug/kg/min via INTRAVENOUS

## 2022-12-17 MED ORDER — MAGNESIUM SULFATE 2 GM/50ML IV SOLN
2.0000 g | Freq: Once | INTRAVENOUS | Status: AC
  Administered 2022-12-17: 2 g via INTRAVENOUS
  Filled 2022-12-17: qty 50

## 2022-12-17 MED ORDER — SODIUM CHLORIDE 0.9 % IV SOLN: INTRAVENOUS | Status: DC | PRN

## 2022-12-17 SURGICAL SUPPLY — 1 items: PAD DEFIB RADIO PHYSIO CONN (PAD) ×1 IMPLANT

## 2022-12-17 NOTE — Plan of Care (Signed)

## 2022-12-17 NOTE — Interval H&P Note (Signed)
History and Physical Interval Note:  12/17/2022 10:25 AM  Shelley Douglas  has presented today for surgery, with the diagnosis of afib.  The various methods of treatment have been discussed with the patient and family. After consideration of risks, benefits and other options for treatment, the patient has consented to  Procedure(s): TRANSESOPHAGEAL ECHOCARDIOGRAM (N/A) CARDIOVERSION (N/A) as a surgical intervention.  The patient's history has been reviewed, patient examined, no change in status, stable for surgery.  I have reviewed the patient's chart and labs.  Questions were answered to the patient's satisfaction.     [name]

## 2022-12-17 NOTE — Progress Notes (Addendum)
Advanced Heart Failure Rounding Note  PCP-Cardiologist: Charlton Haws, MD   Subjective:   Milrinone started 12/12 d/t low-output.  CO-OX 66% on milrinone 0.125.  CVP 10 sitting up in chair.   2.3L UOP yesterday with 80 mg lasix IV BID. Weighing in bed.  Feeling better. Less dyspnea.   Objective:    Weight Range: 90.5 kg Body mass index is 32.2 kg/m.   Vital Signs:   Temp:  [97.6 F (36.4 C)-98.3 F (36.8 C)] 98 F (36.7 C) (12/13 0740) Pulse Rate:  [117-121] 120 (12/13 0332) Resp:  [16-24] 20 (12/13 0332) BP: (110-130)/(79-94) 122/79 (12/13 0332) SpO2:  [93 %-98 %] 94 % (12/13 0332) Weight:  [90.5 kg] 90.5 kg (12/13 0637) Last BM Date : 12/13/22 (per patient)  Weight change: Filed Weights   12/14/22 1622 12/16/22 0500 12/17/22 0637  Weight: 92.3 kg 88.8 kg 90.5 kg   Intake/Output:   Intake/Output Summary (Last 24 hours) at 12/17/2022 0848 Last data filed at 12/17/2022 0700 Gross per 24 hour  Intake 450.28 ml  Output 2300 ml  Net -1849.72 ml   CVP 10  Physical Exam  General: Fatigued appearing. Sitting up in chair. HEENT: normal Neck: supple. JVP to jaw. Carotids 2+ bilat; no bruits.  Cor: PMI nondisplaced. Irregular rhythm, tachy. No rubs, gallops or murmurs. Lungs: clear Abdomen: soft, nontender, nondistended.  Extremities: no cyanosis, clubbing, rash, trace edema Neuro: alert & orientedx3. Affect pleasant   Telemetry   AFL 120s  EKG   N/A  Labs    CBC Recent Labs    12/15/22 0415  WBC 4.5  HGB 11.3*  HCT 35.7*  MCV 89.0  PLT 203   Basic Metabolic Panel Recent Labs    95/28/41 0500 12/17/22 0500  NA 136 134*  K 3.9 3.4*  CL 102 99  CO2 25 26  GLUCOSE 171* 246*  BUN 24* 18  CREATININE 1.42* 1.17*  CALCIUM 9.8 9.0  MG 1.8 1.8   Liver Function Tests No results for input(s): "AST", "ALT", "ALKPHOS", "BILITOT", "PROT", "ALBUMIN" in the last 72 hours. No results for input(s): "LIPASE", "AMYLASE" in the last 72  hours. Cardiac Enzymes No results for input(s): "CKTOTAL", "CKMB", "CKMBINDEX", "TROPONINI" in the last 72 hours.  BNP: BNP (last 3 results) Recent Labs    12/13/22 1420  BNP 1,062.6*    ProBNP (last 3 results) No results for input(s): "PROBNP" in the last 8760 hours.   D-Dimer No results for input(s): "DDIMER" in the last 72 hours.  Hemoglobin A1C No results for input(s): "HGBA1C" in the last 72 hours.  Fasting Lipid Panel No results for input(s): "CHOL", "HDL", "LDLCALC", "TRIG", "CHOLHDL", "LDLDIRECT" in the last 72 hours.  Thyroid Function Tests No results for input(s): "TSH", "T4TOTAL", "T3FREE", "THYROIDAB" in the last 72 hours.  Invalid input(s): "FREET3"  Other results:  Imaging   No results found.  Medications:    Scheduled Medications:  apixaban  5 mg Oral BID   aspirin EC  81 mg Oral Daily   atorvastatin  40 mg Oral Daily   Chlorhexidine Gluconate Cloth  6 each Topical Daily   furosemide  80 mg Intravenous BID   insulin aspart  0-6 Units Subcutaneous TID WC   mupirocin ointment  1 Application Nasal BID   potassium chloride  40 mEq Oral Q4H   sodium chloride flush  10-40 mL Intracatheter Q12H    Infusions:  amiodarone 30 mg/hr (12/17/22 0700)   magnesium sulfate bolus IVPB  milrinone 0.125 mcg/kg/min (12/17/22 0700)    PRN Medications: acetaminophen, albuterol, mouth rinse, sodium chloride flush   Patient Profile   Shelley Douglas 72 y/o female w/ h/o chronic systolic heart failure dating back to 2009 due to ICM, CAD w/ remote inferior infarct w/ stenting to the RCA and subsequent CTO of RCA on repeat cath in 2016, s/p ICD w/ upgrade to CRT-D for LBBB, Afib/flutter, MV disease s/p remote MV annuloplasty in 1996, DVT/PE s/p IVC filter, on Eliquis, CVA, HTN, HLD, Type 2DM, CKD IIIa and b/l carpal tunnel s/p b/l CTR, admitted for a/c CHF in the setting of atrial flutter w/ RVR   Assessment/Plan   1. Acute on Chronic Bventricular Heart  Failure - Long standing since 2009. Ischemic CM. Also w/ LBBB s/p CRT-D  - Last echo 11/23 EF 30-35%, global HK, mild LVH, RV mildly reduced - admitted w/ NYHA Class IIIb symptoms and volume overload, in setting of dietary and medication noncompliance. This is further c/b rapid AFL.  - w/ h/o b/l carpal tunnel, consider w/u for TTR amyloid w/ PYP scan. -- GDMT PTA, only Coreg 25 mg bid per med rec. Had previously been on Naper, Entresto and digoxin. Not clear when/why meds were stopped - Echo EF 20-25% RV  severely reduced, mod mitral stenosis.  - CO-OX 66% on milrinone 0.125. - CVP 10 sitting in chair. JVP to jaw. Give 80 mg lasix IV prior to cardioversion this afternoon.  - Hold coreg with low-output - Add back digoxin  - Start spiro 12.5 mg daily - TEE/DCCV planned later today   2. AFL w/ RVR - h/o Afib/Flutter, on Eliquis - in AFL x 4 days per device interrogation. Suspect CHF triggered arrhythmia as HF symptoms present x 2 wks - Continue Amio drip. On Eliquis 5 mg twice a day.  - TEE/DCCV this afternoon (missed doses of Eliquis in last month)  - Recommend out patient sleep study    3. AKI on CKD IIIa - B/l SCr 1.1 - Admit SCr 1.95 - Stable today at 1.17 - Follow daily    4. H/o PE/DVT - s/p IVC filter. Historically poor compliance with Eliquis. Continue Eliquis.  - D-dimer elevated, 6.05 - V/Q scan low likelihood for PE    5. LBBB - s/p CRT-D - only 90% Bi-V pacing, hopefully will improve once back in NSR    6. CAD - remote inferior infarct, BMS prox and mid RCA  - known CTO/ISR of RCA by cath 2016, treated medically  - HS Trop mildly elevated with flat trend, not consistent with ACS. continue medical management    7. Mitral Valve Disorder - s/p MV annuloplasty in 1996 - Echo 11/23 w/ mild MS, mG 4 mmHg - Mod mitral  stenosis. Mean gradient 6.7 - TEE to further assess.    8. Suspected OSA - h/o snoring  - recommend out patient sleep study   9.  Hypokalemia/hypomagnesemia - K 3.4 and Mag 1.8 - Supp aggressively with diuresis  Length of Stay: 4  FINCH, LINDSAY N, PA-C  12/17/2022, 8:48 AM  Advanced Heart Failure Team Pager [phone] (M-F; 7a - 5p)  Please contact [facility] for night-coverage after hours (5p -7a ) and weekends on [email or web address]  Agree with above.   Remains on milrinone. Co-ox looks good. Diuresing well.   Remains in AF/AFL despite IV amio. On Eliquis. No bleeding  Feels better. No CP or SOB.   General:  Sitting up in bed. No resp difficulty HEENT:  normal Neck: supple. JVP 10 Carotids 2+ bilat; no bruits. No lymphadenopathy or thryomegaly appreciated. Cor: Irregular rate & rhythm. 2/6 TR Lungs: clear Abdomen: soft, nontender, nondistended. No hepatosplenomegaly. No bruits or masses. Good bowel sounds. Extremities: no cyanosis, clubbing, rash, 1+ edema Neuro: alert & orientedx3, cranial nerves grossly intact. moves all 4 extremities w/o difficulty. Affect pleasant  Diuresing well with milrinone support. Would continue.   Plan TEE/DC-CV of AF/AFL today. Continue IV amio  Arvilla Meres, MD  10:25 AM

## 2022-12-17 NOTE — Progress Notes (Signed)
Progress Note   Patient: Shelley Douglas:[id number] DOB: 1950-10-20 DOA: 12/13/2022     4 DOS: the patient was seen and examined on 12/17/2022   Brief hospital course: Mrs. Lomibao was admitted to the hospital with the working diagnosis of atrial fibrillation with rapid ventricular response.   72 yo female with the past medical history of heart failure, atrial fibrillation, ventricular tachycardia, coronary artery disease, hypertension, dyslipidemia, CVA, T2DM, pulmonary embolism and DVT who presented with dyspnea and palpitations. Reported 2 days of progressive dyspnea, associated with exertional chest pressure, palpitations, orthopnea and lower extremity edema. Apparently she has been not adherent to her medications at home. On her initial physical examination her blood pressure was 125/101, HR 132, RR 21 and 02 saturation 90%, lungs with no wheezing or rales, no rhonchi, heart with S1 and S2 present, irregular with no gallops, or murmurs, abdomen with no distention and positive lower extremity edema.   EKG 129 bpm, right axis deviation, normal intervals, atrial flutter rhythm with one PVC, poor R R wave progression with no significant ST segment or  T wave changes.    Patient was placed on amiodarone drip for rate control.  12/11 plan for TEE cardioversion when improved volume status.  12/12 continue volume overloaded.  12/13 direct current cardioversion today.     Assessment and Plan: * Atrial flutter with rapid ventricular response (HCC) Patient has been placed on amiodarone drip for rate control. Telemetry continue with atrial flutter rhythm 2:1 with ventricular paced rhythm with rate 120 bpm.   Anticoagulation with apixaban Continue carvedilol  Plan for direct current cardioversion today.    Acute on chronic systolic CHF (congestive heart failure) (HCC) Echocardiogram with reduced LV systolic function with EF 20 to 25 %, global hypokinesis, LV cavity with severe  dilatation, mild LVH, RV systolic function with severe reduction, RV cavity with moderate enlargement, RVSP 48.9 mmHg, LA and RA with severe dilatation, moderate to severe mitral valve regurgitation (it has been replaced/repair), moderate to severe tricuspid regurgitation   Urine output is 2,300  Systolic blood pressure is 120 mmHg. SV02 has been improving.   Plan to continue diuresis with furosemide 80 mg IV q 12 hrs On milrinone for inotropic support.  Limited pharmacologic therapy due to risk of hypotension.   Acute kidney injury superimposed on chronic kidney disease (HCC) Hyponatremia, hypokalemia  CKD stage 2   Renal function today with serum cr at 1,17 with K at 3,4 and serum bicarbonate at 26  Na 134 Mg 1.8   Plan to add 40 meq KCl x2 and 2 g Mag sulfate.  Continue diuresis with furosemide  Follow up renal function and electrolytes.   CAD (coronary artery disease)/HLD No chest pain,   Essential hypertension Continue close blood pressure monitoring   Cerebral thrombosis with cerebral infarction Continue anticoagulation with apixaban and continue with statin.   Type 2 diabetes mellitus with hyperlipidemia (HCC) Continue glucose cover and monitoring with insulin  Continue with statin therapy. Fasting glucose is 246 mg/dl   Continue with statin therapy.   Obesity, class 1 Calculated BMI is 32.2         Subjective: Patient with no chest pain, dyspnea has been improving, no PND or lower extremity edema   Physical Exam: Vitals:   12/17/22 1500 12/17/22 1537 12/17/22 1600 12/17/22 1605  BP:    123/62  Pulse: 73  77 78  Resp: 16  17 (!) 22  Temp:  97.8 F (36.6 C)  TempSrc:  Oral    SpO2: 96%  99% 98%  Weight:      Height:       Neurology awake and alert ENT with mild pallor Cardiovascular with S1 and S2 present, irregularly irregular with no gallops, rubs or murmurs Respiratory with rales at bases with no wheezing Abdomen with no distention  No lower  extremity edema   Data Reviewed:    Family Communication: no family at the bedside   Disposition: Status is: Inpatient Remains inpatient appropriate because: heart failure   Planned Discharge Destination: Home     Author: Coralie Keens, MD 12/17/2022 4:59 PM  For on call review [email or web address].

## 2022-12-17 NOTE — CV Procedure (Signed)
   TRANSESOPHAGEAL ECHOCARDIOGRAM GUIDED DIRECT CURRENT CARDIOVERSION  NAME:  Shelley Douglas   MRN: [id number] DOB:  1950/05/30   ADMIT DATE: 12/13/2022  INDICATIONS:  Atrial fibrillation/flutter  PROCEDURE:   Informed consent was obtained prior to the procedure. The risks, benefits and alternatives for the procedure were discussed and the patient comprehended these risks.  Risks include, but are not limited to, cough, sore throat, vomiting, nausea, somnolence, esophageal and stomach trauma or perforation, bleeding, low blood pressure, aspiration, pneumonia, infection, trauma to the teeth and death.    After a procedural time-out, the oropharynx was anesthetized and the patient was sedated by the anesthesia service. The transesophageal probe was inserted in the esophagus and stomach without difficulty and multiple views were obtained.   FINDINGS:  LEFT VENTRICLE: EF = 30-35% with septal dyssynchrony from RV pacing  RIGHT VENTRICLE: Mildly decreased  LEFT ATRIUM: Severely enlarged  LEFT ATRIAL APPENDAGE: Surgically absent  RIGHT ATRIUM: Moderately dilated  AORTIC VALVE:  Trileaflet. Trivial AI  MITRAL VALVE:    s/p MV ring. Well functioning. Mild MR. Mean gradient (Mild MS)  TRICUSPID VALVE: Moderate to severe TR due to restriction of septal leaflet from pacing wire  PULMONIC VALVE: Trivial PI  INTERATRIAL SEPTUM: No PFO/ASD  PERICARDIUM: Small anterior effusion. No tamponade  DESCENDING AORTA: Not visualized   CARDIOVERSION:     Indications:  Atrial Flutter  Procedure Details:  Once the TEE was complete, the patient had the defibrillator pads placed in the anterior and posterior position. Once an appropriate level of sedation was achieved, the patient received a single biphasic, synchronized 200J shock with prompt conversion to sinus rhythm. No apparent complications.   Arvilla Meres, MD  11:18 AM

## 2022-12-17 NOTE — Assessment & Plan Note (Signed)
Calculated BMI is 32.2  

## 2022-12-17 NOTE — H&P (View-Only) (Signed)
Advanced Heart Failure Rounding Note  PCP-Cardiologist: Charlton Haws, MD   Subjective:   Milrinone started 12/12 d/t low-output.  CO-OX 66% on milrinone 0.125.  CVP 10 sitting up in chair.   2.3L UOP yesterday with 80 mg lasix IV BID. Weighing in bed.  Feeling better. Less dyspnea.   Objective:    Weight Range: 90.5 kg Body mass index is 32.2 kg/m.   Vital Signs:   Temp:  [97.6 F (36.4 C)-98.3 F (36.8 C)] 98 F (36.7 C) (12/13 0740) Pulse Rate:  [117-121] 120 (12/13 0332) Resp:  [16-24] 20 (12/13 0332) BP: (110-130)/(79-94) 122/79 (12/13 0332) SpO2:  [93 %-98 %] 94 % (12/13 0332) Weight:  [90.5 kg] 90.5 kg (12/13 0637) Last BM Date : 12/13/22 (per patient)  Weight change: Filed Weights   12/14/22 1622 12/16/22 0500 12/17/22 0637  Weight: 92.3 kg 88.8 kg 90.5 kg   Intake/Output:   Intake/Output Summary (Last 24 hours) at 12/17/2022 0848 Last data filed at 12/17/2022 0700 Gross per 24 hour  Intake 450.28 ml  Output 2300 ml  Net -1849.72 ml   CVP 10  Physical Exam  General: Fatigued appearing. Sitting up in chair. HEENT: normal Neck: supple. JVP to jaw. Carotids 2+ bilat; no bruits.  Cor: PMI nondisplaced. Irregular rhythm, tachy. No rubs, gallops or murmurs. Lungs: clear Abdomen: soft, nontender, nondistended.  Extremities: no cyanosis, clubbing, rash, trace edema Neuro: alert & orientedx3. Affect pleasant   Telemetry   AFL 120s  EKG   N/A  Labs    CBC Recent Labs    12/15/22 0415  WBC 4.5  HGB 11.3*  HCT 35.7*  MCV 89.0  PLT 203   Basic Metabolic Panel Recent Labs    95/28/41 0500 12/17/22 0500  NA 136 134*  K 3.9 3.4*  CL 102 99  CO2 25 26  GLUCOSE 171* 246*  BUN 24* 18  CREATININE 1.42* 1.17*  CALCIUM 9.8 9.0  MG 1.8 1.8   Liver Function Tests No results for input(s): "AST", "ALT", "ALKPHOS", "BILITOT", "PROT", "ALBUMIN" in the last 72 hours. No results for input(s): "LIPASE", "AMYLASE" in the last 72  hours. Cardiac Enzymes No results for input(s): "CKTOTAL", "CKMB", "CKMBINDEX", "TROPONINI" in the last 72 hours.  BNP: BNP (last 3 results) Recent Labs    12/13/22 1420  BNP 1,062.6*    ProBNP (last 3 results) No results for input(s): "PROBNP" in the last 8760 hours.   D-Dimer No results for input(s): "DDIMER" in the last 72 hours.  Hemoglobin A1C No results for input(s): "HGBA1C" in the last 72 hours.  Fasting Lipid Panel No results for input(s): "CHOL", "HDL", "LDLCALC", "TRIG", "CHOLHDL", "LDLDIRECT" in the last 72 hours.  Thyroid Function Tests No results for input(s): "TSH", "T4TOTAL", "T3FREE", "THYROIDAB" in the last 72 hours.  Invalid input(s): "FREET3"  Other results:  Imaging   No results found.  Medications:    Scheduled Medications:  apixaban  5 mg Oral BID   aspirin EC  81 mg Oral Daily   atorvastatin  40 mg Oral Daily   Chlorhexidine Gluconate Cloth  6 each Topical Daily   furosemide  80 mg Intravenous BID   insulin aspart  0-6 Units Subcutaneous TID WC   mupirocin ointment  1 Application Nasal BID   potassium chloride  40 mEq Oral Q4H   sodium chloride flush  10-40 mL Intracatheter Q12H    Infusions:  amiodarone 30 mg/hr (12/17/22 0700)   magnesium sulfate bolus IVPB  milrinone 0.125 mcg/kg/min (12/17/22 0700)    PRN Medications: acetaminophen, albuterol, mouth rinse, sodium chloride flush   Patient Profile   Shelley Douglas 72 y/o female w/ h/o chronic systolic heart failure dating back to 2009 due to ICM, CAD w/ remote inferior infarct w/ stenting to the RCA and subsequent CTO of RCA on repeat cath in 2016, s/p ICD w/ upgrade to CRT-D for LBBB, Afib/flutter, MV disease s/p remote MV annuloplasty in 1996, DVT/PE s/p IVC filter, on Eliquis, CVA, HTN, HLD, Type 2DM, CKD IIIa and b/l carpal tunnel s/p b/l CTR, admitted for a/c CHF in the setting of atrial flutter w/ RVR   Assessment/Plan   1. Acute on Chronic Bventricular Heart  Failure - Long standing since 2009. Ischemic CM. Also w/ LBBB s/p CRT-D  - Last echo 11/23 EF 30-35%, global HK, mild LVH, RV mildly reduced - admitted w/ NYHA Class IIIb symptoms and volume overload, in setting of dietary and medication noncompliance. This is further c/b rapid AFL.  - w/ h/o b/l carpal tunnel, consider w/u for TTR amyloid w/ PYP scan. -- GDMT PTA, only Coreg 25 mg bid per med rec. Had previously been on Naper, Entresto and digoxin. Not clear when/why meds were stopped - Echo EF 20-25% RV  severely reduced, mod mitral stenosis.  - CO-OX 66% on milrinone 0.125. - CVP 10 sitting in chair. JVP to jaw. Give 80 mg lasix IV prior to cardioversion this afternoon.  - Hold coreg with low-output - Add back digoxin  - Start spiro 12.5 mg daily - TEE/DCCV planned later today   2. AFL w/ RVR - h/o Afib/Flutter, on Eliquis - in AFL x 4 days per device interrogation. Suspect CHF triggered arrhythmia as HF symptoms present x 2 wks - Continue Amio drip. On Eliquis 5 mg twice a day.  - TEE/DCCV this afternoon (missed doses of Eliquis in last month)  - Recommend out patient sleep study    3. AKI on CKD IIIa - B/l SCr 1.1 - Admit SCr 1.95 - Stable today at 1.17 - Follow daily    4. H/o PE/DVT - s/p IVC filter. Historically poor compliance with Eliquis. Continue Eliquis.  - D-dimer elevated, 6.05 - V/Q scan low likelihood for PE    5. LBBB - s/p CRT-D - only 90% Bi-V pacing, hopefully will improve once back in NSR    6. CAD - remote inferior infarct, BMS prox and mid RCA  - known CTO/ISR of RCA by cath 2016, treated medically  - HS Trop mildly elevated with flat trend, not consistent with ACS. continue medical management    7. Mitral Valve Disorder - s/p MV annuloplasty in 1996 - Echo 11/23 w/ mild MS, mG 4 mmHg - Mod mitral  stenosis. Mean gradient 6.7 - TEE to further assess.    8. Suspected OSA - h/o snoring  - recommend out patient sleep study   9.  Hypokalemia/hypomagnesemia - K 3.4 and Mag 1.8 - Supp aggressively with diuresis  Length of Stay: 4  FINCH, LINDSAY N, PA-C  12/17/2022, 8:48 AM  Advanced Heart Failure Team Pager [phone] (M-F; 7a - 5p)  Please contact [facility] for night-coverage after hours (5p -7a ) and weekends on [email or web address]  Agree with above.   Remains on milrinone. Co-ox looks good. Diuresing well.   Remains in AF/AFL despite IV amio. On Eliquis. No bleeding  Feels better. No CP or SOB.   General:  Sitting up in bed. No resp difficulty HEENT:  normal Neck: supple. JVP 10 Carotids 2+ bilat; no bruits. No lymphadenopathy or thryomegaly appreciated. Cor: Irregular rate & rhythm. 2/6 TR Lungs: clear Abdomen: soft, nontender, nondistended. No hepatosplenomegaly. No bruits or masses. Good bowel sounds. Extremities: no cyanosis, clubbing, rash, 1+ edema Neuro: alert & orientedx3, cranial nerves grossly intact. moves all 4 extremities w/o difficulty. Affect pleasant  Diuresing well with milrinone support. Would continue.   Plan TEE/DC-CV of AF/AFL today. Continue IV amio  Arvilla Meres, MD  10:25 AM

## 2022-12-17 NOTE — Anesthesia Postprocedure Evaluation (Signed)
Anesthesia Post Note  Patient: Shelley Douglas  Procedure(s) Performed: TRANSESOPHAGEAL ECHOCARDIOGRAM CARDIOVERSION     Patient location during evaluation: Cath Lab Anesthesia Type: MAC Level of consciousness: awake Pain management: pain level controlled Vital Signs Assessment: post-procedure vital signs reviewed and stable Respiratory status: spontaneous breathing, nonlabored ventilation and respiratory function stable Cardiovascular status: blood pressure returned to baseline and stable Postop Assessment: no apparent nausea or vomiting Anesthetic complications: no   No notable events documented.  Last Vitals:  Vitals:   12/17/22 1600 12/17/22 1605  BP:  123/62  Pulse: 77 78  Resp: 17 (!) 22  Temp:    SpO2: 99% 98%    Last Pain:  Vitals:   12/17/22 1537  TempSrc: Oral  PainSc:                  [name] P Celes Dedic

## 2022-12-17 NOTE — Plan of Care (Signed)

## 2022-12-17 NOTE — Transfer of Care (Signed)
Immediate Anesthesia Transfer of Care Note  Patient: Shelley Douglas  Procedure(s) Performed: TRANSESOPHAGEAL ECHOCARDIOGRAM CARDIOVERSION  Patient Location: PACU and Cath Lab  Anesthesia Type:MAC  Level of Consciousness: awake, oriented, and patient cooperative  Airway & Oxygen Therapy: Patient Spontanous Breathing  Post-op Assessment: Report given to RN and Post -op Vital signs reviewed and stable  Post vital signs: Reviewed and stable  Last Vitals:  Vitals Value Taken Time  BP 111/69 (82)   Temp    Pulse 74   Resp 20   SpO2 95     Last Pain:  Vitals:   12/17/22 0740  TempSrc: Oral  PainSc:       Patients Stated Pain Goal: 0 (12/17/22 0332)  Complications: No notable events documented.

## 2022-12-17 NOTE — Anesthesia Preprocedure Evaluation (Addendum)
Anesthesia Evaluation  Patient identified by MRN, date of birth, ID band Patient awake    Reviewed: Allergy & Precautions, NPO status , Patient's Chart, lab work & pertinent test results  Airway Mallampati: II  TM Distance: >3 FB Neck ROM: Full    Dental  (+) Upper Dentures   Pulmonary former smoker, PE   Pulmonary exam normal breath sounds clear to auscultation       Cardiovascular hypertension, Pt. on home beta blockers pulmonary hypertension+ CAD, + Past MI, + Cardiac Stents, + CABG, +CHF and + DVT  + dysrhythmias Atrial Fibrillation + Cardiac Defibrillator + Valvular Problems/Murmurs  Rhythm:Regular Rate:Tachycardia  ECHO: 1. Left ventricular ejection fraction, by estimation, is 20 to 25%. Left  ventricular ejection fraction by 2D MOD biplane is 22.3 %. The left  ventricle has severely decreased function. The left ventricle demonstrates  global hypokinesis. The left  ventricular internal cavity size was severely dilated. There is mild  concentric left ventricular hypertrophy. Left ventricular diastolic  function could not be evaluated.   2. Right ventricular systolic function is severely reduced. The right  ventricular size is moderately enlarged. There is moderately elevated  pulmonary artery systolic pressure. The estimated right ventricular  systolic pressure is 48.9 mmHg.   3. Left atrial size was severely dilated.   4. Right atrial size was severely dilated.   5. The mitral valve has been repaired/replaced. Moderate to severe mitral  valve regurgitation. Moderate mitral stenosis. The mean mitral valve  gradient is 6.7 mmHg with average heart rate of 68 bpm. There is a  prosthetic annuloplasty ring present in the   mitral position. Procedure Date: prior to 2013.   6. The tricuspid valve is abnormal. Tricuspid valve regurgitation is  moderate to severe.   7. The aortic valve has an indeterminant number of cusps. Aortic  valve  regurgitation is not visualized. No aortic stenosis is present.   8. The inferior vena cava is dilated in size with <50% respiratory  variability, suggesting right atrial pressure of 15 mmHg.     Neuro/Psych  Headaches, Seizures -,   Anxiety     CVA    GI/Hepatic negative GI ROS, Neg liver ROS,,,  Endo/Other  diabetes    Renal/GU Renal disease     Musculoskeletal negative musculoskeletal ROS (+)    Abdominal  (+) + obese  Peds  Hematology  (+) Blood dyscrasia (Eliquis), anemia INR 1.5   Anesthesia Other Findings A-fib  Reproductive/Obstetrics                              Anesthesia Physical Anesthesia Plan  ASA: 4  Anesthesia Plan: MAC   Post-op Pain Management:    Induction:   PONV Risk Score and Plan: 2 and Propofol infusion and Treatment may vary due to age or medical condition  Airway Management Planned: Nasal Cannula  Additional Equipment:   Intra-op Plan:   Post-operative Plan:   Informed Consent: I have reviewed the patients History and Physical, chart, labs and discussed the procedure including the risks, benefits and alternatives for the proposed anesthesia with the patient or authorized representative who has indicated his/her understanding and acceptance.     Dental advisory given  Plan Discussed with: CRNA and Surgeon  Anesthesia Plan Comments: (Milrinone drip Amiodarone drip)        Anesthesia Quick Evaluation

## 2022-12-17 NOTE — Progress Notes (Signed)
Echocardiogram Echocardiogram Transesophageal has been performed.  Shelley Douglas 12/17/2022, 11:29 AM

## 2022-12-18 DIAGNOSIS — N179 Acute kidney failure, unspecified: Secondary | ICD-10-CM | POA: Diagnosis not present

## 2022-12-18 DIAGNOSIS — I5023 Acute on chronic systolic (congestive) heart failure: Secondary | ICD-10-CM | POA: Diagnosis not present

## 2022-12-18 DIAGNOSIS — I4892 Unspecified atrial flutter: Secondary | ICD-10-CM | POA: Diagnosis not present

## 2022-12-18 DIAGNOSIS — I10 Essential (primary) hypertension: Secondary | ICD-10-CM | POA: Diagnosis not present

## 2022-12-18 LAB — BASIC METABOLIC PANEL
Anion gap: 4 — ABNORMAL LOW (ref 5–15)
BUN: 17 mg/dL (ref 8–23)
CO2: 30 mmol/L (ref 22–32)
Calcium: 9.3 mg/dL (ref 8.9–10.3)
Chloride: 98 mmol/L (ref 98–111)
Creatinine, Ser: 1.27 mg/dL — ABNORMAL HIGH (ref 0.44–1.00)
GFR, Estimated: 45 mL/min — ABNORMAL LOW (ref >60)
Glucose, Bld: 296 mg/dL — ABNORMAL HIGH (ref 70–99)
Potassium: 4.2 mmol/L (ref 3.5–5.1)
Sodium: 132 mmol/L — ABNORMAL LOW (ref 135–145)

## 2022-12-18 LAB — COOXEMETRY PANEL
Carboxyhemoglobin: 1.6 % — ABNORMAL HIGH (ref 0.5–1.5)
Methemoglobin: <0.7 % (ref 0.0–1.5)
O2 Saturation: 70.7 %
Total hemoglobin: 11.1 g/dL — ABNORMAL LOW (ref 12.0–16.0)

## 2022-12-18 LAB — GLUCOSE, CAPILLARY
Glucose-Capillary: 198 mg/dL — ABNORMAL HIGH (ref 70–99)
Glucose-Capillary: 202 mg/dL — ABNORMAL HIGH (ref 70–99)
Glucose-Capillary: 153 mg/dL — ABNORMAL HIGH (ref 70–99)
Glucose-Capillary: 118 mg/dL — ABNORMAL HIGH (ref 70–99)
Glucose-Capillary: 179 mg/dL — ABNORMAL HIGH (ref 70–99)

## 2022-12-18 LAB — MAGNESIUM: Magnesium: 2.1 mg/dL (ref 1.7–2.4)

## 2022-12-18 MED ORDER — INSULIN GLARGINE-YFGN 100 UNIT/ML ~~LOC~~ SOLN
5.0000 [IU] | Freq: Every day | SUBCUTANEOUS | Status: DC
  Administered 2022-12-18 – 2022-12-20 (×3): 5 [IU] via SUBCUTANEOUS
  Filled 2022-12-18 (×3): qty 0.05

## 2022-12-18 MED ORDER — TORSEMIDE 20 MG PO TABS
40.0000 mg | ORAL_TABLET | Freq: Every day | ORAL | Status: DC
  Administered 2022-12-18 – 2022-12-20 (×3): 40 mg via ORAL
  Filled 2022-12-18 (×3): qty 2

## 2022-12-18 NOTE — Evaluation (Signed)
Occupational Therapy Evaluation Patient Details Name: Shelley Douglas MRN: [id number] DOB: [birth year] Today's Date: 12/18/2022   History of Present Illness Shelley Douglas is a 72 y.o. female presented with few weeks of worsening dyspnea on exertion and exertional chest pain followed by 2 days of rapid HR / palpitations. 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 Underwent TEE for cardioversion 12/13 with success.  PHMx: HFrEF with CRT-D, hx VT, Afib, CAD with PCI, RCA stent, MV annuloplasty, DVT/PE, on AC, CVA, HTN, HLD, DM.   Clinical Impression   This 72 yo female admitted with above presents to acute OT with PLOF per her report of being able to do her own basic ADLs at a [facility] level and her husband doing all the IADLs and driving. Currently she is at a CGA level when up on her feet and Min A level with ambulation with concern for ADLs. She will continue to benefit from acute OT without need for follow up.      If plan is discharge home, recommend the following: A little help with walking and/or transfers;A little help with bathing/dressing/bathroom;Assist for transportation;Assistance with cooking/housework;Direct supervision/assist for medications management;Direct supervision/assist for financial management    Functional Status Assessment  Patient has had a recent decline in their functional status and demonstrates the ability to make significant improvements in function in a reasonable and predictable amount of time.  Equipment Recommendations  None recommended by OT    Recommendations for Other Services       Precautions / Restrictions Precautions Precautions: Fall Restrictions Weight Bearing Restrictions Per Provider Order: No      Mobility Bed Mobility               General bed mobility comments: pt up in recliner upon arrival    Transfers Overall transfer level: Needs  assistance Equipment used: Rolling walker (2 wheels), 1 person hand held assist Transfers: Sit to/from Stand Sit to Stand: Contact guard assist                  Balance Overall balance assessment: Mild deficits observed, not formally tested                                         ADL either performed or assessed with clinical judgement   ADL Overall ADL's : Needs assistance/impaired Eating/Feeding: Independent;Sitting   Grooming: Contact guard assist;Standing   Upper Body Bathing: Set up;Sitting   Lower Body Bathing: Contact guard assist;Sit to/from stand   Upper Body Dressing : Set up;Sitting   Lower Body Dressing: Contact guard assist;Sit to/from stand   Toilet Transfer: Contact guard assist;Ambulation Statistician Details (indicate cue type and reason): one person HHA Toileting- Clothing Manipulation and Hygiene: Contact guard assist;Sit to/from stand               Vision Baseline Vision/History: 1 Wears glasses (for near and far) Ability to See in Adequate Light: 0 Adequate Patient Visual Report: No change from baseline Additional Comments: reports left eye is worse than right eye            Pertinent Vitals/Pain   VSS on RA     Extremity/Trunk Assessment Upper Extremity Assessment Upper Extremity Assessment: Right hand dominant;RUE deficits/detail RUE Deficits / Details: limited AROM at shoulder RUE Coordination: decreased gross motor  Communication Communication Communication: Difficulty communicating thoughts/reduced clarity of speech   Cognition Arousal: Alert Behavior During Therapy: WFL for tasks assessed/performed Overall Cognitive Status: Impaired/Different from baseline Area of Impairment: Orientation                               General Comments: orientation: pt orginally said 2025, then corrected to 2024; delayed in coming up with month; had a hard time saying [facility]; initially she said she was here because we wanted her here but then with further questioning she said my heart. When asked how long she and her husband had been married she said 17 years (she is only 28), when asked how this adds up she said well he is 31 then she said they were married in 79--the math does not add up.                Home Living Family/patient expects to be discharged to:: Private residence Living Arrangements: Spouse/significant other Available Help at Discharge: Family;Available 24 hours/day Type of Home: Mobile home Home Access: Stairs to enter   Entrance Stairs-Rails: None Home Layout: One level     Bathroom Shower/Tub: Tub/shower unit;Curtain   Bathroom Toilet: Handicapped height     Home Equipment: Grab bars - tub/shower;Cane - single point   Additional Comments: husband has a rollator, SPC, potty chair. Pt does not drive, husband drives (is 73 yo) and he does the cooking and cleaning      Prior Functioning/Environment Prior Level of Function : Independent/Modified Independent             Mobility Comments: Uses SPC          OT Problem List: Decreased range of motion;Impaired balance (sitting and/or standing);Decreased cognition      OT Treatment/Interventions: Self-care/ADL training;Patient/family education;DME and/or AE instruction;Balance training    OT Goals(Current goals can be found in the care plan section) Acute Rehab OT Goals Patient Stated Goal: to get home for Christmas but then said she was suppose to be coming into the hosptial for a procedure Christmas Eve OT Goal Formulation: With patient Time For Goal Achievement: 01/01/23 Potential to Achieve Goals: Good  OT Frequency: Min 1X/week    Co-evaluation PT/OT/SLP Co-Evaluation/Treatment: Yes Reason for Co-Treatment: For patient/therapist safety;To address functional/ADL transfers PT goals addressed during session: Mobility/safety with  mobility;Balance;Strengthening/ROM;Proper use of DME OT goals addressed during session: Strengthening/ROM;ADL's and self-care      AM-PAC OT "6 Clicks" Daily Activity     Outcome Measure Help from another person eating meals?: None Help from another person taking care of personal grooming?: A Little Help from another person toileting, which includes using toliet, bedpan, or urinal?: A Little Help from another person bathing (including washing, rinsing, drying)?: A Little Help from another person to put on and taking off regular upper body clothing?: A Little Help from another person to put on and taking off regular lower body clothing?: A Little 6 Click Score: 19   End of Session Equipment Utilized During Treatment: Rolling walker (2 wheels) Nurse Communication: Mobility status (purewick back in or not)  Activity Tolerance: Patient tolerated treatment well Patient left: in chair;with call bell/phone within reach  OT Visit Diagnosis: Unsteadiness on feet (R26.81);Muscle weakness (generalized) (M62.81);Cognitive communication deficit (R41.841) Symptoms and signs involving cognitive functions: Cerebral infarction (old)                Time: 4010-2725 OT Time  Calculation (min): 19 min Charges:  OT General Charges $OT Visit: 1 Visit OT Evaluation $OT Eval Moderate Complexity: 1 Mod Cathy L. OT Acute Rehabilitation Services Office [phone]    Evette Georges 12/18/2022, 10:40 AM

## 2022-12-18 NOTE — Evaluation (Signed)
Physical Therapy Evaluation Patient Details Name: Shelley Douglas MRN: [id number] DOB: [birth year] Today's Date: 12/18/2022  History of Present Illness  Shelley Douglas is a 72 y.o. female presented 12/9 with dyspnea on exertion and exertional chest pain followed by 2 days of rapid HR / palpitations. 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 Underwent TEE for cardioversion 12/13 with success.  PHMx: HFrEF with CRT-D, hx VT, Afib, CAD with PCI, RCA stent, MV annuloplasty, DVT/PE, on AC, CVA, HTN, HLD, DM.  Clinical Impression  Pt admitted with above diagnosis. Mod I with SPC PTA. Required CGA for safety with gait today, progressing from RW, to HHA, to no assistive device. Pt states she feels close to baseline. Anticipate further functional progress prior to d/c with increased activity; encouraged mobilize with staff regularly. Do not foresee need for follow-up PT services at d/c, and pt agrees. Will follow and progress during acute stay. Pt currently with functional limitations due to the deficits listed below (see PT Problem List). Pt will benefit from acute skilled PT to increase their independence and safety with mobility to allow discharge.           If plan is discharge home, recommend the following: Help with stairs or ramp for entrance;Supervision due to cognitive status;Assist for transportation   Can travel by private vehicle        Equipment Recommendations None recommended by PT  Recommendations for Other Services       Functional Status Assessment Patient has had a recent decline in their functional status and demonstrates the ability to make significant improvements in function in a reasonable and predictable amount of time.     Precautions / Restrictions Precautions Precautions: Fall Restrictions Weight Bearing Restrictions Per Provider Order: No      Mobility  Bed  Mobility               General bed mobility comments: pt up in recliner upon arrival    Transfers Overall transfer level: Needs assistance Equipment used: Rolling walker (2 wheels) Transfers: Sit to/from Stand Sit to Stand: Contact guard assist           General transfer comment: CGA for safety, cues for hand placement for RW to steady herself upon rising.    Ambulation/Gait Ambulation/Gait assistance: Contact guard assist Gait Distance (Feet): 150 Feet Assistive device: Rolling walker (2 wheels), 1 person hand held assist, None Gait Pattern/deviations: Step-through pattern, Decreased stride length, Antalgic Gait velocity: dec Gait velocity interpretation: <1.8 ft/sec, indicate of risk for recurrent falls   General Gait Details: Educated on safe AD use with RW for support. Progressed with distance, reduced RW to hand held support, and eventually no support. Minor antalgic pattern from hx of knee pain. No overt LOB or buckling noted. VSS. Feels close to baseline.  Stairs            Wheelchair Mobility     Tilt Bed    Modified Rankin (Stroke Patients Only)       Balance Overall balance assessment: Mild deficits observed, not formally tested                                           Pertinent Vitals/Pain Pain Assessment Pain Assessment: No/denies pain    Home Living Family/patient expects to be discharged  to:: Private residence Living Arrangements: Spouse/significant other Available Help at Discharge: Family;Available 24 hours/day Type of Home: Mobile home Home Access: Stairs to enter Entrance Stairs-Rails: None Entrance Stairs-Number of Steps: 3-5 (report different from prior recorded information)   Home Layout: One level Home Equipment: Grab bars - tub/shower;Cane - single point Additional Comments: husband has a rollator, SPC, potty chair. Pt does not drive, husband drives (is 10 yo) and he does the cooking and cleaning     Prior Function Prior Level of Function : Independent/Modified Independent             Mobility Comments: Uses SPC       Extremity/Trunk Assessment   Upper Extremity Assessment Upper Extremity Assessment: Defer to OT evaluation RUE Deficits / Details: limited AROM at shoulder RUE Coordination: decreased gross motor    Lower Extremity Assessment Lower Extremity Assessment: Generalized weakness       Communication   Communication Communication: Difficulty communicating thoughts/reduced clarity of speech  Cognition Arousal: Alert Behavior During Therapy: [facility] for tasks assessed/performed Overall Cognitive Status: Impaired/Different from baseline Area of Impairment: Orientation                               General Comments: orientation: pt orginally said 2025, then corrected to 2024; delayed in coming up with month; had a hard time saying [facility]; initially she said she was here because we wanted her here but then with further questioning she said my heart. When asked how long she and her husband had been married she said 41 years (she is only 63), when asked how this adds up she said well he is 27 then she said they were married in 79--the math does not add up.        General Comments General comments (skin integrity, edema, etc.): HR 77, SpO2 95% on RA, BP 103/77    Exercises     Assessment/Plan    PT Assessment Patient needs continued PT services  PT Problem List Decreased strength;Decreased activity tolerance;Decreased balance;Decreased mobility;Decreased cognition;Decreased knowledge of use of DME;Decreased safety awareness;Decreased knowledge of precautions;Cardiopulmonary status limiting activity       PT Treatment Interventions DME instruction;Gait training;Stair training;Functional mobility training;Therapeutic activities;Therapeutic exercise;Balance training;Neuromuscular re-education;Cognitive remediation;Patient/family  education    PT Goals (Current goals can be found in the Care Plan section)  Acute Rehab PT Goals Patient Stated Goal: Go home PT Goal Formulation: With patient Time For Goal Achievement: 01/01/23 Potential to Achieve Goals: Good    Frequency Min 1X/week     Co-evaluation PT/OT/SLP Co-Evaluation/Treatment: Yes Reason for Co-Treatment: To address functional/ADL transfers;Necessary to address cognition/behavior during functional activity;Complexity of the patient's impairments (multi-system involvement) PT goals addressed during session: Mobility/safety with mobility;Balance;Strengthening/ROM;Proper use of DME OT goals addressed during session: Strengthening/ROM;ADL's and self-care       AM-PAC PT "6 Clicks" Mobility  Outcome Measure Help needed turning from your back to your side while in a flat bed without using bedrails?: None Help needed moving from lying on your back to sitting on the side of a flat bed without using bedrails?: None Help needed moving to and from a bed to a chair (including a wheelchair)?: A Little Help needed standing up from a chair using your arms (e.g., wheelchair or bedside chair)?: A Little Help needed to walk in hospital room?: A Little Help needed climbing 3-5 steps with a railing? : A Little 6 Click Score: 20  End of Session   Activity Tolerance: Patient tolerated treatment well Patient left: in chair;with call bell/phone within reach;with nursing/sitter in room Nurse Communication: Mobility status PT Visit Diagnosis: Unsteadiness on feet (R26.81);Other abnormalities of gait and mobility (R26.89);Muscle weakness (generalized) (M62.81)    Time: 1610-9604 PT Time Calculation (min) (ACUTE ONLY): 19 min   Charges:   PT Evaluation $PT Eval Low Complexity: 1 Low   PT General Charges $$ ACUTE PT VISIT: 1 Visit         Kathlyn Sacramento, PT, DPT [facility]  Rehabilitation Services Physical Therapist Office: [phone] Website:  [email or web address]   Berton Mount 12/18/2022, 11:05 AM

## 2022-12-18 NOTE — Progress Notes (Addendum)
Advanced Heart Failure Rounding Note  PCP-Cardiologist: Charlton Haws, MD   Subjective:    Underwent TEE/DC-CV 12/13  Remains in NSR.   Echo 30-35% RV mildly decreased Mild MR Mod to severe TR  On milrinone 0.125 Co-ox 71%  CVP 10   Denies SOB    Objective:    Weight Range: 90.5 kg Body mass index is 32.2 kg/m.   Vital Signs:   Temp:  [95.7 F (35.4 C)-98 F (36.7 C)] 97.9 F (36.6 C) (12/14 0749) Pulse Rate:  [72-120] 75 (12/14 0424) Resp:  [15-27] 19 (12/14 0424) BP: (93-131)/(58-87) 120/58 (12/14 0424) SpO2:  [93 %-99 %] 95 % (12/14 0424) Last BM Date : 12/17/22  Weight change: Filed Weights   12/14/22 1622 12/16/22 0500 12/17/22 0637  Weight: 92.3 kg 88.8 kg 90.5 kg   Intake/Output:   Intake/Output Summary (Last 24 hours) at 12/18/2022 1021 Last data filed at 12/18/2022 0308 Gross per 24 hour  Intake 385.26 ml  Output 2000 ml  Net -1614.74 ml   Physical Exam   General:  Sitting up in bed No resp difficulty HEENT: normal Neck: supple. JVP 10 Carotids 2+ bilat; no bruits. No lymphadenopathy or thryomegaly appreciated. Cor: PMI nondisplaced. Regular rate & rhythm. 2/6 TR Lungs: clear Abdomen: soft, nontender, nondistended. No hepatosplenomegaly. No bruits or masses. Good bowel sounds. Extremities: no cyanosis, clubbing, rash, edema Neuro: alert & orientedx3, cranial nerves grossly intact. moves all 4 extremities w/o difficulty. Affect pleasant    Telemetry   Sinus 70s Personally reviewed  Labs    CBC No results for input(s): "WBC", "NEUTROABS", "HGB", "HCT", "MCV", "PLT" in the last 72 hours.  Basic Metabolic Panel Recent Labs    16/10/96 0500 12/18/22 0418  NA 134* 132*  K 3.4* 4.2  CL 99 98  CO2 26 30  GLUCOSE 246* 296*  BUN 18 17  CREATININE 1.17* 1.27*  CALCIUM 9.0 9.3  MG 1.8 2.1   Liver Function Tests No results for input(s): "AST", "ALT", "ALKPHOS", "BILITOT", "PROT", "ALBUMIN" in the last 72 hours. No results for  input(s): "LIPASE", "AMYLASE" in the last 72 hours. Cardiac Enzymes No results for input(s): "CKTOTAL", "CKMB", "CKMBINDEX", "TROPONINI" in the last 72 hours.  BNP: BNP (last 3 results) Recent Labs    12/13/22 1420  BNP 1,062.6*    ProBNP (last 3 results) No results for input(s): "PROBNP" in the last 8760 hours.   D-Dimer No results for input(s): "DDIMER" in the last 72 hours.  Hemoglobin A1C No results for input(s): "HGBA1C" in the last 72 hours.  Fasting Lipid Panel No results for input(s): "CHOL", "HDL", "LDLCALC", "TRIG", "CHOLHDL", "LDLDIRECT" in the last 72 hours.  Thyroid Function Tests No results for input(s): "TSH", "T4TOTAL", "T3FREE", "THYROIDAB" in the last 72 hours.  Invalid input(s): "FREET3"  Other results:  Imaging   EP STUDY Result Date: 12/17/2022 See surgical note for result.   Medications:    Scheduled Medications:  apixaban  5 mg Oral BID   aspirin EC  81 mg Oral Daily   atorvastatin  40 mg Oral Daily   Chlorhexidine Gluconate Cloth  6 each Topical Daily   digoxin  0.125 mg Oral Daily   furosemide  80 mg Intravenous BID   insulin aspart  0-6 Units Subcutaneous TID WC   mupirocin ointment  1 Application Nasal BID   potassium chloride  40 mEq Oral BID   sodium chloride flush  10-40 mL Intracatheter Q12H   spironolactone  12.5 mg Oral  Daily    Infusions:  amiodarone 30 mg/hr (12/18/22 9562)   milrinone Stopped (12/18/22 0848)    PRN Medications: acetaminophen, albuterol, mouth rinse, sodium chloride flush   Patient Profile   Shelley Douglas 72 y/o female w/ h/o chronic systolic heart failure dating back to 2009 due to ICM, CAD w/ remote inferior infarct w/ stenting to the RCA and subsequent CTO of RCA on repeat cath in 2016, s/p ICD w/ upgrade to CRT-D for LBBB, Afib/flutter, MV disease s/p remote MV annuloplasty in 1996, DVT/PE s/p IVC filter, on Eliquis, CVA, HTN, HLD, Type 2DM, CKD IIIa and b/l carpal tunnel s/p b/l CTR,  admitted for a/c CHF in the setting of atrial flutter w/ RVR   Assessment/Plan   1. Acute on Chronic Bventricular Heart Failure - Long standing since 2009. Ischemic CM. Also w/ LBBB s/p CRT-D  - Last echo 11/23 EF 30-35%, global HK, mild LVH, RV mildly reduced - admitted w/ NYHA Class IIIb symptoms and volume overload, in setting of dietary and medication noncompliance. This is further c/b rapid AFL.  - w/ h/o b/l carpal tunnel, consider w/u for TTR amyloid w/ PYP scan. -- GDMT PTA, only Coreg 25 mg bid per med rec. Had previously been on [place], Entresto and digoxin. Not clear when/why meds were stopped - Echo EF 20-25% RV  severely reduced, mod mitral stenosis.  - TEE EF 30-35% RV mildly reduced mild MR/MS. Moderate TR - CO-OX 71% on milrinone 0.125. Can stop milrinone - CVP remains 10 Switch to torsemide 40  - Holding coreg with low-output - Continue digoxin 0.125 - Continue spiro 12.5 mg daily - Add losartan 25 daily   2. AFL w/ RVR, persistent - h/o Afib/Flutter, on Eliquis - in AFL x 4 days per device interrogation. Suspect CHF triggered arrhythmia as HF symptoms present x 2 wks - s/p DC-CV on 12/13 Remains in sinus - Continue Amio drip one more day. On Eliquis 5 mg twice a day.  - Recommend out patient sleep study    3. AKI on CKD IIIa - B/l SCr 1.1 - Admit SCr 1.95 - Stable today at 1.27 - Follow   4. H/o PE/DVT - s/p IVC filter. Historically poor compliance with Eliquis. - D-dimer elevated, 6.05 - V/Q scan low likelihood for PE  - Continue Eliquis   5. LBBB - s/p CRT-D - only 90% Bi-V pacing on admit, hopefully will improve once back in NSR    6. CAD - remote inferior infarct, BMS prox and mid RCA  - known CTO/ISR of RCA by cath 2016, treated medically  - HS Trop mildly elevated with flat trend, not consistent with ACS.  - No change   7. Mitral Valve Disorder - s/p MV annuloplasty in 1996 - Echo 11/23 w/ mild MS, mG 4 mmHg - Mod mitral  stenosis. Mean  gradient 6.7 - MS gradient mild on TEE   8. Suspected OSA - h/o snoring  - recommend out patient sleep study   9. Hypokalemia/hypomagnesemia - K 4.2 Mg 2.1 - Supp aggressively with diuresis  Ok to go to floor  Length of Stay: 5  Arvilla Meres, MD  12/18/2022, 10:21 AM  Advanced Heart Failure Team Pager [phone] (M-F; 7a - 5p)  Please contact [facility] for night-coverage after hours (5p -7a ) and weekends on [email or web address]

## 2022-12-18 NOTE — Progress Notes (Addendum)
Progress Note   Patient: Shelley Douglas GEX:[id number] DOB: Oct 22, 1950 DOA: 12/13/2022     5 DOS: the patient was seen and examined on 12/18/2022   Brief hospital course: Mrs. Stanforth was admitted to the hospital with the working diagnosis of atrial fibrillation with rapid ventricular response.   72 yo female with the past medical history of heart failure, atrial fibrillation, ventricular tachycardia, coronary artery disease, hypertension, dyslipidemia, CVA, T2DM, pulmonary embolism and DVT who presented with dyspnea and palpitations. Reported 2 days of progressive dyspnea, associated with exertional chest pressure, palpitations, orthopnea and lower extremity edema. Apparently she has been not adherent to her medications at home. On her initial physical examination her blood pressure was 125/101, HR 132, RR 21 and 02 saturation 90%, lungs with no wheezing or rales, no rhonchi, heart with S1 and S2 present, irregular with no gallops, or murmurs, abdomen with no distention and positive lower extremity edema.   EKG 129 bpm, right axis deviation, normal intervals, atrial flutter rhythm with one PVC, poor R R wave progression with no significant ST segment or  T wave changes.    Patient was placed on amiodarone drip for rate control.  12/11 plan for TEE cardioversion when improved volume status.  12/12 continue volume overloaded, added milrinone for low output heart failure.  12/13 direct current cardioversion today. Improved perfusion.  12/14 stopped milrinone.     Assessment and Plan: * Atrial flutter with rapid ventricular response (HCC) Sp direct current cardioversion, converted to sinus rhythm.    Plan to continue with amiodarone load IV and possible transition to po in am.  Anticoagulation with apixaban Continue carvedilol  Acute on chronic systolic CHF (congestive heart failure) (HCC) Echocardiogram with reduced LV systolic function with EF 20 to 25 %, global hypokinesis, LV  cavity with severe dilatation, mild LVH, RV systolic function with severe reduction, RV cavity with moderate enlargement, RVSP 48.9 mmHg, LA and RA with severe dilatation, moderate to severe mitral valve regurgitation (it has been replaced/repair), moderate to severe tricuspid regurgitation   Urine output is 2,000  Systolic blood pressure is 120 mmHg. SV02 70   Stopped milrinone today.  Added digoxin today.  Continue with carvedilol and spironolactone.  Transitioned to oral loop diuretic.  Limited pharmacologic therapy due to risk of hypotension.   Acute kidney injury superimposed on chronic kidney disease (HCC) Hyponatremia, hypokalemia  CKD stage 2   Renal function today with serum cr at 1,27 with K at 4.2 and serum bicarbonate at 30  Na 132 Mg 2.1   Transitioned to torsemide.  Follow up renal function and electrolytes in am,  CAD (coronary artery disease)/HLD No chest pain,   Essential hypertension Continue close blood pressure monitoring   Cerebral thrombosis with cerebral infarction Continue anticoagulation with apixaban and continue with statin.   Type 2 diabetes mellitus with hyperlipidemia (HCC) Continue glucose cover and monitoring with insulin  Continue with statin therapy. Fasting glucose is 296 mg/dl  Will add low dose basal insulin.   Continue with statin therapy.   Obesity, class 1 Calculated BMI is 32.2         Subjective: Patient is feeling better, no chest pain or dyspnea, no lower extremity edema, she is out of bed to chair   Physical Exam: Vitals:   12/18/22 0328 12/18/22 0424 12/18/22 0749 12/18/22 1151  BP:  (!) 120/58    Pulse:  75    Resp:  19    Temp: (!) 97.2 F (36.2 C)  97.9 F (36.6 C) (!) 97.5 F (36.4 C)  TempSrc: Axillary  Oral Oral  SpO2:  95%    Weight:      Height:       Neurology awake and alert ENT with mild pallor Cardiovascular with S1 and S2 present and regular with no gallops, rubs or murmurs Respiratory with no  rales or wheezing, no rhonchi Abdomen with no distention  Data Reviewed:    Family Communication: family at the bedside   Disposition: Status is: Inpatient Remains inpatient appropriate because: recovering heart failure   Planned Discharge Destination: Home      Author: Coralie Keens, MD 12/18/2022 2:45 PM  For on call review [email or web address].

## 2022-12-19 DIAGNOSIS — I5023 Acute on chronic systolic (congestive) heart failure: Secondary | ICD-10-CM | POA: Diagnosis not present

## 2022-12-19 DIAGNOSIS — N179 Acute kidney failure, unspecified: Secondary | ICD-10-CM | POA: Diagnosis not present

## 2022-12-19 DIAGNOSIS — I6330 Cerebral infarction due to thrombosis of unspecified cerebral artery: Secondary | ICD-10-CM | POA: Diagnosis not present

## 2022-12-19 DIAGNOSIS — I4892 Unspecified atrial flutter: Secondary | ICD-10-CM | POA: Diagnosis not present

## 2022-12-19 LAB — BASIC METABOLIC PANEL
Anion gap: 11 (ref 5–15)
BUN: 17 mg/dL (ref 8–23)
CO2: 30 mmol/L (ref 22–32)
Calcium: 10.3 mg/dL (ref 8.9–10.3)
Chloride: 93 mmol/L — ABNORMAL LOW (ref 98–111)
Creatinine, Ser: 1.33 mg/dL — ABNORMAL HIGH (ref 0.44–1.00)
GFR, Estimated: 43 mL/min — ABNORMAL LOW (ref >60)
Glucose, Bld: 241 mg/dL — ABNORMAL HIGH (ref 70–99)
Potassium: 4.3 mmol/L (ref 3.5–5.1)
Sodium: 134 mmol/L — ABNORMAL LOW (ref 135–145)

## 2022-12-19 LAB — GLUCOSE, CAPILLARY
Glucose-Capillary: 197 mg/dL — ABNORMAL HIGH (ref 70–99)
Glucose-Capillary: 91 mg/dL (ref 70–99)
Glucose-Capillary: 134 mg/dL — ABNORMAL HIGH (ref 70–99)
Glucose-Capillary: 183 mg/dL — ABNORMAL HIGH (ref 70–99)

## 2022-12-19 LAB — COOXEMETRY PANEL
Carboxyhemoglobin: 1.9 % — ABNORMAL HIGH (ref 0.5–1.5)
Methemoglobin: 0.8 % (ref 0.0–1.5)
O2 Saturation: 74.3 %
Total hemoglobin: 11.9 g/dL — ABNORMAL LOW (ref 12.0–16.0)

## 2022-12-19 LAB — MAGNESIUM: Magnesium: 2.1 mg/dL (ref 1.7–2.4)

## 2022-12-19 MED ORDER — AMIODARONE HCL 200 MG PO TABS
200.0000 mg | ORAL_TABLET | Freq: Two times a day (BID) | ORAL | Status: DC
  Administered 2022-12-19 – 2022-12-20 (×2): 200 mg via ORAL
  Filled 2022-12-19 (×2): qty 1

## 2022-12-19 MED ORDER — SACUBITRIL-VALSARTAN 24-26 MG PO TABS
1.0000 | ORAL_TABLET | Freq: Two times a day (BID) | ORAL | Status: DC
  Administered 2022-12-19 – 2022-12-20 (×2): 1 via ORAL
  Filled 2022-12-19 (×2): qty 1

## 2022-12-19 NOTE — Plan of Care (Signed)
  Problem: Fluid Volume: Goal: Ability to maintain a balanced intake and output will improve Outcome: Progressing   Problem: Health Behavior/Discharge Planning: Goal: Ability to manage health-related needs will improve Outcome: Progressing   Problem: Metabolic: Goal: Ability to maintain appropriate glucose levels will improve Outcome: Progressing   Problem: Nutritional: Goal: Maintenance of adequate nutrition will improve Outcome: Progressing   Problem: Activity: Goal: Risk for activity intolerance will decrease Outcome: Progressing   Problem: Coping: Goal: Ability to adjust to condition or change in health will improve Outcome: Completed/Met   Problem: Tissue Perfusion: Goal: Adequacy of tissue perfusion will improve Outcome: Completed/Met   Problem: Education: Goal: Knowledge of General Education information will improve Description: Including pain rating scale, medication(s)/side effects and non-pharmacologic comfort measures Outcome: Completed/Met   Problem: Elimination: Goal: Will not experience complications related to urinary retention Outcome: Completed/Met

## 2022-12-19 NOTE — Progress Notes (Addendum)
Advanced Heart Failure Rounding Note  PCP-Cardiologist: Charlton Haws, MD   Subjective:    Underwent TEE/DC-CV 12/13  Echo 30-35% RV mildly decreased Mild MR Mod to severe TR  Remains in NSR.   Milrinone stopped yesterday Co-ox 74%. Diuresing modestly on IV lasix.   Feeling better. No CP or SOB.    Objective:    Weight Range: 88.8 kg Body mass index is 31.59 kg/m.   Vital Signs:   Temp:  [98.1 F (36.7 C)-98.5 F (36.9 C)] 98.1 F (36.7 C) (12/15 1214) Pulse Rate:  [53-78] 69 (12/15 1214) Resp:  [16-18] 17 (12/15 1214) BP: (129-135)/(65-83) 135/77 (12/15 1214) SpO2:  [100 %] 100 % (12/15 0422) Weight:  [88.8 kg] 88.8 kg (12/15 0422) Last BM Date : 12/17/22  Weight change: Filed Weights   12/17/22 5284 12/18/22 1746 12/19/22 0422  Weight: 90.5 kg 89.2 kg 88.8 kg   Intake/Output:   Intake/Output Summary (Last 24 hours) at 12/19/2022 1826 Last data filed at 12/19/2022 1324 Gross per 24 hour  Intake 169.99 ml  Output --  Net 169.99 ml   Physical Exam   General:  Sitting up in bed . No resp difficulty HEENT: normal Neck: supple. JVP 8-9 Carotids 2+ bilat; no bruits. No lymphadenopathy or thryomegaly appreciated. Cor: PMI nondisplaced. Regular rate & rhythm. 2/6 TR Lungs: clear Abdomen: soft, nontender, nondistended. No hepatosplenomegaly. No bruits or masses. Good bowel sounds. Extremities: no cyanosis, clubbing, rash, edema Neuro: alert & orientedx3, cranial nerves grossly intact. moves all 4 extremities w/o difficulty. Affect pleasant   Telemetry   Sinus 60-70s with v pacing Personally reviewed  Labs    CBC No results for input(s): "WBC", "NEUTROABS", "HGB", "HCT", "MCV", "PLT" in the last 72 hours.  Basic Metabolic Panel Recent Labs    40/10/27 0418 12/19/22 0420  NA 132* 134*  K 4.2 4.3  CL 98 93*  CO2 30 30  GLUCOSE 296* 241*  BUN 17 17  CREATININE 1.27* 1.33*  CALCIUM 9.3 10.3  MG 2.1 2.1   Liver Function Tests No results  for input(s): "AST", "ALT", "ALKPHOS", "BILITOT", "PROT", "ALBUMIN" in the last 72 hours. No results for input(s): "LIPASE", "AMYLASE" in the last 72 hours. Cardiac Enzymes No results for input(s): "CKTOTAL", "CKMB", "CKMBINDEX", "TROPONINI" in the last 72 hours.  BNP: BNP (last 3 results) Recent Labs    12/13/22 1420  BNP 1,062.6*    ProBNP (last 3 results) No results for input(s): "PROBNP" in the last 8760 hours.   D-Dimer No results for input(s): "DDIMER" in the last 72 hours.  Hemoglobin A1C No results for input(s): "HGBA1C" in the last 72 hours.  Fasting Lipid Panel No results for input(s): "CHOL", "HDL", "LDLCALC", "TRIG", "CHOLHDL", "LDLDIRECT" in the last 72 hours.  Thyroid Function Tests No results for input(s): "TSH", "T4TOTAL", "T3FREE", "THYROIDAB" in the last 72 hours.  Invalid input(s): "FREET3"  Other results:  Imaging   No results found.   Medications:    Scheduled Medications:  apixaban  5 mg Oral BID   aspirin EC  81 mg Oral Daily   atorvastatin  40 mg Oral Daily   Chlorhexidine Gluconate Cloth  6 each Topical Daily   digoxin  0.125 mg Oral Daily   insulin aspart  0-6 Units Subcutaneous TID WC   insulin glargine-yfgn  5 Units Subcutaneous Daily   mupirocin ointment  1 Application Nasal BID   sodium chloride flush  10-40 mL Intracatheter Q12H   spironolactone  12.5 mg Oral  Daily   torsemide  40 mg Oral Daily    Infusions:  amiodarone 30 mg/hr (12/19/22 1318)    PRN Medications: acetaminophen, albuterol, mouth rinse, sodium chloride flush   Patient Profile   Shelley Douglas 72 y/o female w/ h/o chronic systolic heart failure dating back to 2009 due to ICM, CAD w/ remote inferior infarct w/ stenting to the RCA and subsequent CTO of RCA on repeat cath in 2016, s/p ICD w/ upgrade to CRT-D for LBBB, Afib/flutter, MV disease s/p remote MV annuloplasty in 1996, DVT/PE s/p IVC filter, on Eliquis, CVA, HTN, HLD, Type 2DM, CKD IIIa and b/l  carpal tunnel s/p b/l CTR, admitted for a/c CHF in the setting of atrial flutter w/ RVR   Assessment/Plan   1. Acute on Chronic Bventricular Heart Failure - Long standing since 2009. Ischemic CM. Also w/ LBBB s/p CRT-D  - Last echo 11/23 EF 30-35%, global HK, mild LVH, RV mildly reduced - admitted w/ NYHA Class IIIb symptoms and volume overload, in setting of dietary and medication noncompliance. This is further c/b rapid AFL.  - w/ h/o b/l carpal tunnel, consider w/u for TTR amyloid w/ PYP scan. -- GDMT PTA, only Coreg 25 mg bid per med rec. Had previously been on [place], Entresto and digoxin. Not clear when/why meds were stopped - Echo EF 20-25% RV  severely reduced, mod mitral stenosis.  - TEE EF 30-35% RV mildly reduced mild MR/MS. Moderate TR - Milrinone stopped yesterday Co-ox 74% - CVP 8-9 on torsemide 40. Continue  - Continue digoxin 0.125 - Continue spiro 12.5 mg daily - Switch losartan to Entresto  - Add low-dose b-blocker in am if BP stable - Eventual SGLT2i as toelrated   2. AFL w/ RVR, persistent - h/o Afib/Flutter, on Eliquis - in AFL x 4 days per device interrogation. Suspect CHF triggered arrhythmia as HF symptoms present x 2 wks - s/p DC-CV on 12/13 Remains in sinus - Switch amio to 20o po bid On Eliquis 5 mg twice a day.  - Recommend out patient sleep study    3. AKI on CKD IIIa - B/l SCr 1.1 - Admit SCr 1.95 - Stable today at 1.27 -> 1.33 - Follow   4. H/o PE/DVT - s/p IVC filter. Historically poor compliance with Eliquis. - D-dimer elevated, 6.05 - V/Q scan low likelihood for PE  - Continue Eliquis   5. LBBB - s/p CRT-D - only 90% Bi-V pacing on admit, hopefully will improve once back in NSR  - no change   6. CAD - remote inferior infarct, BMS prox and mid RCA  - known CTO/ISR of RCA by cath 2016, treated medically  - HS Trop mildly elevated with flat trend, not consistent with ACS.  - No s/s angina   7. Mitral Valve Disorder - s/p MV  annuloplasty in 1996 - Echo 11/23 w/ mild MS, mG 4 mmHg - Mod mitral  stenosis. Mean gradient 6.7 - MS gradient mild on TEE   8. Suspected OSA - h/o snoring  - recommend out patient sleep study   9. Hypokalemia/hypomagnesemia - K 4.3 Mg 2.1 - Supp aggressively with diuresis  Possibly home in am if remains stable from HF standpoint.   Length of Stay: 6  Arvilla Meres, MD  12/19/2022, 6:26 PM  Advanced Heart Failure Team Pager [phone] (M-F; 7a - 5p)  Please contact [facility] for night-coverage after hours (5p -7a ) and weekends on [email or web address]

## 2022-12-19 NOTE — Progress Notes (Signed)
Mobility Specialist Progress Note:    12/19/22 1200  Mobility  Activity Ambulated with assistance in hallway  Level of Assistance Contact guard assist, steadying assist  Assistive Device Other (Comment) (IV Pole)  Distance Ambulated (ft) 400 ft  Activity Response Tolerated well  Mobility Referral Yes  Mobility visit 1 Mobility  Mobility Specialist Start Time (ACUTE ONLY) 1140  Mobility Specialist Stop Time (ACUTE ONLY) 1158  Mobility Specialist Time Calculation (min) (ACUTE ONLY) 18 min   Received pt in bed having no complaints and agreeable to mobility. Pt was asymptomatic throughout ambulation and returned to room w/o fault. Left in bed w/ call bell in reach and all needs met.   [place]ante Earlene Plater Mobility Specialist Please contact via Special educational needs teacher or Rehab office at [phone]

## 2022-12-19 NOTE — Plan of Care (Signed)

## 2022-12-19 NOTE — Progress Notes (Signed)
Progress Note   Patient: Shelley Douglas UXL:[id number] DOB: [birth year] DOA: 12/13/2022     6 DOS: the patient was seen and examined on 12/19/2022   Brief hospital course: Shelley Douglas was admitted to the hospital with the working diagnosis of atrial fibrillation with rapid ventricular response.   72 yo female with the past medical history of heart failure, atrial fibrillation, ventricular tachycardia, coronary artery disease, hypertension, dyslipidemia, CVA, T2DM, pulmonary embolism and DVT who presented with dyspnea and palpitations. Reported 2 days of progressive dyspnea, associated with exertional chest pressure, palpitations, orthopnea and lower extremity edema. Apparently she has been not adherent to her medications at home. On her initial physical examination her blood pressure was 125/101, HR 132, RR 21 and 02 saturation 90%, lungs with no wheezing or rales, no rhonchi, heart with S1 and S2 present, irregular with no gallops, or murmurs, abdomen with no distention and positive lower extremity edema.   EKG 129 bpm, right axis deviation, normal intervals, atrial flutter rhythm with one PVC, poor R R wave progression with no significant ST segment or  T wave changes.    Patient was placed on amiodarone drip for rate control.  12/11 plan for TEE cardioversion when improved volume status.  12/12 continue volume overloaded, added milrinone for low output heart failure.  12/13 direct current cardioversion today. Improved perfusion.  12/14 stopped milrinone.  12/15 patient continue on sinus rhythm and euvolemic state. Possible discharge home tomorrow.   Assessment and Plan: * Atrial flutter with rapid ventricular response (HCC) Sp direct current cardioversion, converted to sinus rhythm.   Telemetry with atrial sensing and ventricular pacing.   Plan to continue with amiodarone load IV and possible transition to po likely today.  Anticoagulation with apixaban Continue carvedilol  Acute  on chronic systolic CHF (congestive heart failure) (HCC) Echocardiogram with reduced LV systolic function with EF 20 to 25 %, global hypokinesis, LV cavity with severe dilatation, mild LVH, RV systolic function with severe reduction, RV cavity with moderate enlargement, RVSP 48.9 mmHg, LA and RA with severe dilatation, moderate to severe mitral valve regurgitation (it has been replaced/repair), moderate to severe tricuspid regurgitation   Acute on chronic core pulmonale  Pulmonary hypertension   Urine output is 1,200 ml   Systolic blood pressure is 120 mmHg. SV02 74.3   12/14 stopped milrinone  Plan to continue medical therapy with digoxin, carvedilol and spironolactone.  Transitioned to oral loop diuretic, torsemide 40 mg po daily.  Limited pharmacologic therapy due to risk of hypotension.   Acute kidney injury superimposed on chronic kidney disease (HCC) Hyponatremia, hypokalemia  CKD stage 2   Renal function with serum cr at 1,3 with K at 4.3 and serum bicarbonate at 30 Na 134 Mg 2.1   Transitioned to torsemide.  Follow up renal function and electrolytes in am,  CAD (coronary artery disease)/HLD No chest pain,   Essential hypertension Continue close blood pressure monitoring   Cerebral thrombosis with cerebral infarction Continue anticoagulation with apixaban and continue with statin.   Type 2 diabetes mellitus with hyperlipidemia (HCC) Continue glucose cover and monitoring with insulin  Continue with statin therapy. Fasting glucose is 241 mg/dl \ Capillary 536, 644, 034  Continue with low dose basal insulin.   Continue with statin therapy.   Obesity, class 1 Calculated BMI is 32.2         Subjective: Patient with no chest pain or dyspnea, no PND or orthopnea.   Physical Exam: Vitals:   12/18/22 1751 12/18/22  2013 12/19/22 0422 12/19/22 0755  BP: 134/76 132/67 135/83 129/65  Pulse:  (!) 53 78   Resp: 19 16 16 18   Temp: 98.4 F (36.9 C) 98.1 F (36.7 C)  98.1 F (36.7 C) 98.5 F (36.9 C)  TempSrc: Oral Oral Oral Oral  SpO2:  100% 100%   Weight:   88.8 kg   Height:       Neurology awake and alert ENT with mild pallor Cardiovascular with S1 and S2 present and regular, positive systolic murmur at the right lower sternal border.  No JVD No lower extremity edema Respiratory with no rales or wheezing, no rhonchi Abdomen with no distention  Data Reviewed:    Family Communication: I spoke with patient's daughter and grandson at the bedside, we talked in detail about patient's condition, plan of care and prognosis and all questions were addressed.   Disposition: Status is: Inpatient Remains inpatient appropriate because: recovering heart failure, possible dc home tomorrow   Planned Discharge Destination: Home      Author: Coralie Keens, MD 12/19/2022 11:00 AM  For on call review [email or web address].

## 2022-12-20 ENCOUNTER — Other Ambulatory Visit (HOSPITAL_COMMUNITY): Payer: Self-pay

## 2022-12-20 ENCOUNTER — Encounter (HOSPITAL_COMMUNITY): Payer: Self-pay | Admitting: Internal Medicine

## 2022-12-20 DIAGNOSIS — I2510 Atherosclerotic heart disease of native coronary artery without angina pectoris: Secondary | ICD-10-CM | POA: Diagnosis not present

## 2022-12-20 DIAGNOSIS — N179 Acute kidney failure, unspecified: Secondary | ICD-10-CM | POA: Diagnosis not present

## 2022-12-20 DIAGNOSIS — I4892 Unspecified atrial flutter: Secondary | ICD-10-CM | POA: Diagnosis not present

## 2022-12-20 DIAGNOSIS — E66811 Obesity, class 1: Secondary | ICD-10-CM

## 2022-12-20 DIAGNOSIS — I5023 Acute on chronic systolic (congestive) heart failure: Secondary | ICD-10-CM | POA: Diagnosis not present

## 2022-12-20 DIAGNOSIS — I2583 Coronary atherosclerosis due to lipid rich plaque: Secondary | ICD-10-CM

## 2022-12-20 LAB — GLUCOSE, CAPILLARY
Glucose-Capillary: 176 mg/dL — ABNORMAL HIGH (ref 70–99)
Glucose-Capillary: 190 mg/dL — ABNORMAL HIGH (ref 70–99)

## 2022-12-20 LAB — BASIC METABOLIC PANEL
Anion gap: 6 (ref 5–15)
BUN: 22 mg/dL (ref 8–23)
CO2: 32 mmol/L (ref 22–32)
Calcium: 10.6 mg/dL — ABNORMAL HIGH (ref 8.9–10.3)
Chloride: 96 mmol/L — ABNORMAL LOW (ref 98–111)
Creatinine, Ser: 1.44 mg/dL — ABNORMAL HIGH (ref 0.44–1.00)
GFR, Estimated: 39 mL/min — ABNORMAL LOW (ref >60)
Glucose, Bld: 155 mg/dL — ABNORMAL HIGH (ref 70–99)
Potassium: 4.4 mmol/L (ref 3.5–5.1)
Sodium: 134 mmol/L — ABNORMAL LOW (ref 135–145)

## 2022-12-20 LAB — COOXEMETRY PANEL
Carboxyhemoglobin: 2 % — ABNORMAL HIGH (ref 0.5–1.5)
Methemoglobin: 2 % — ABNORMAL HIGH (ref 0.0–1.5)
O2 Saturation: 74.5 %
Total hemoglobin: 13.8 g/dL (ref 12.0–16.0)

## 2022-12-20 LAB — MAGNESIUM: Magnesium: 2.3 mg/dL (ref 1.7–2.4)

## 2022-12-20 MED ORDER — DIGOXIN 125 MCG PO TABS
0.1250 mg | ORAL_TABLET | Freq: Every day | ORAL | 0 refills | Status: DC
  Filled 2022-12-20: qty 30, 30d supply, fill #0

## 2022-12-20 MED ORDER — SACUBITRIL-VALSARTAN 24-26 MG PO TABS
1.0000 | ORAL_TABLET | Freq: Two times a day (BID) | ORAL | 0 refills | Status: DC
  Filled 2022-12-20: qty 60, 30d supply, fill #0

## 2022-12-20 MED ORDER — FUROSEMIDE 40 MG PO TABS
40.0000 mg | ORAL_TABLET | Freq: Every day | ORAL | 0 refills | Status: DC
  Filled 2022-12-20: qty 30, 30d supply, fill #0

## 2022-12-20 MED ORDER — EMPAGLIFLOZIN 10 MG PO TABS
10.0000 mg | ORAL_TABLET | Freq: Every day | ORAL | 0 refills | Status: DC
  Filled 2022-12-20: qty 30, 30d supply, fill #0

## 2022-12-20 MED ORDER — AMIODARONE HCL 200 MG PO TABS
200.0000 mg | ORAL_TABLET | Freq: Two times a day (BID) | ORAL | 0 refills | Status: DC
  Filled 2022-12-20: qty 60, 30d supply, fill #0

## 2022-12-20 MED ORDER — SPIRONOLACTONE 25 MG PO TABS
12.5000 mg | ORAL_TABLET | Freq: Every day | ORAL | 0 refills | Status: DC
  Filled 2022-12-20: qty 15, 30d supply, fill #0

## 2022-12-20 MED ORDER — ATORVASTATIN CALCIUM 40 MG PO TABS
40.0000 mg | ORAL_TABLET | Freq: Every day | ORAL | 0 refills | Status: DC
  Filled 2022-12-20: qty 30, 30d supply, fill #0

## 2022-12-20 MED ORDER — METOPROLOL SUCCINATE ER 25 MG PO TB24
12.5000 mg | ORAL_TABLET | Freq: Every day | ORAL | Status: DC
  Administered 2022-12-20: 12.5 mg via ORAL
  Filled 2022-12-20: qty 1

## 2022-12-20 MED ORDER — METOPROLOL SUCCINATE ER 25 MG PO TB24
12.5000 mg | ORAL_TABLET | Freq: Every day | ORAL | 0 refills | Status: DC
  Filled 2022-12-20: qty 15, 30d supply, fill #0

## 2022-12-20 MED ORDER — EMPAGLIFLOZIN 10 MG PO TABS
10.0000 mg | ORAL_TABLET | Freq: Every day | ORAL | Status: DC
  Administered 2022-12-20: 10 mg via ORAL
  Filled 2022-12-20: qty 1

## 2022-12-20 MED ORDER — FUROSEMIDE 40 MG PO TABS: 40.0000 mg | ORAL_TABLET | Freq: Every day | ORAL | Status: DC

## 2022-12-20 NOTE — Progress Notes (Signed)
Advanced Heart Failure Rounding Note  PCP-Cardiologist: Charlton Haws, MD   Subjective:    Remains in paced rhythm. Co-ox 75.  Stable for discharge from HF standpoint.  Feeling well this morning. Slept well overnight. Able to lie flat. No SOB, CP, or dizziness with ambulation.   Objective:    Weight Range: 87 kg Body mass index is 30.96 kg/m.   Vital Signs:   Temp:  [97.8 F (36.6 C)-98.1 F (36.7 C)] 97.9 F (36.6 C) (12/16 0746) Pulse Rate:  [69-75] 69 (12/16 0746) Resp:  [16-18] 16 (12/16 0746) BP: (125-140)/(64-78) 125/72 (12/16 0746) SpO2:  [95 %-100 %] 98 % (12/16 0746) Weight:  [87 kg] 87 kg (12/16 0541) Last BM Date : 12/17/22  Weight change: Filed Weights   12/18/22 1746 12/19/22 0422 12/20/22 0541  Weight: 89.2 kg 88.8 kg 87 kg   Intake/Output:   Intake/Output Summary (Last 24 hours) at 12/20/2022 0941 Last data filed at 12/20/2022 0926 Gross per 24 hour  Intake 236 ml  Output --  Net 236 ml   Physical Exam   General: Well appearing. No distress on RA HEENT: neck supple.   Cardiac: JVP at clavicle. S1 and S2 present. No murmurs or rub. Resp: Lung sounds clear and equal B/L Abdomen: Soft, non-tender, non-distended. + BS. Extremities: Warm and dry. No rash, cyanosis.  No edema.  Neuro: Alert and oriented x3. Affect pleasant. Moves all extremities without difficulty.  Telemetry   Vpaced in 48s (personally reviewed)  Labs    CBC No results for input(s): "WBC", "NEUTROABS", "HGB", "HCT", "MCV", "PLT" in the last 72 hours.  Basic Metabolic Panel Recent Labs    16/10/96 0420 12/20/22 0413  NA 134* 134*  K 4.3 4.4  CL 93* 96*  CO2 30 32  GLUCOSE 241* 155*  BUN 17 22  CREATININE 1.33* 1.44*  CALCIUM 10.3 10.6*  MG 2.1 2.3   Liver Function Tests No results for input(s): "AST", "ALT", "ALKPHOS", "BILITOT", "PROT", "ALBUMIN" in the last 72 hours. No results for input(s): "LIPASE", "AMYLASE" in the last 72 hours. Cardiac Enzymes No  results for input(s): "CKTOTAL", "CKMB", "CKMBINDEX", "TROPONINI" in the last 72 hours.  BNP: BNP (last 3 results) Recent Labs    12/13/22 1420  BNP 1,062.6*    ProBNP (last 3 results) No results for input(s): "PROBNP" in the last 8760 hours.   D-Dimer No results for input(s): "DDIMER" in the last 72 hours.  Hemoglobin A1C No results for input(s): "HGBA1C" in the last 72 hours.  Fasting Lipid Panel No results for input(s): "CHOL", "HDL", "LDLCALC", "TRIG", "CHOLHDL", "LDLDIRECT" in the last 72 hours.  Thyroid Function Tests No results for input(s): "TSH", "T4TOTAL", "T3FREE", "THYROIDAB" in the last 72 hours.  Invalid input(s): "FREET3"  Other results:  Imaging   No results found.  Medications:    Scheduled Medications:  amiodarone  200 mg Oral BID   apixaban  5 mg Oral BID   aspirin EC  81 mg Oral Daily   atorvastatin  40 mg Oral Daily   Chlorhexidine Gluconate Cloth  6 each Topical Daily   digoxin  0.125 mg Oral Daily   insulin aspart  0-6 Units Subcutaneous TID WC   insulin glargine-yfgn  5 Units Subcutaneous Daily   mupirocin ointment  1 Application Nasal BID   sacubitril-valsartan  1 tablet Oral BID   sodium chloride flush  10-40 mL Intracatheter Q12H   spironolactone  12.5 mg Oral Daily   torsemide  40 mg  Oral Daily    Infusions:    PRN Medications: acetaminophen, albuterol, mouth rinse, sodium chloride flush  Patient Profile   MAKELA CARREAU 72 y/o female w/ h/o chronic systolic heart failure dating back to 2009 due to ICM, CAD w/ remote inferior infarct w/ stenting to the RCA and subsequent CTO of RCA on repeat cath in 2016, s/p ICD w/ upgrade to CRT-D for LBBB, Afib/flutter, MV disease s/p remote MV annuloplasty in 1996, DVT/PE s/p IVC filter, on Eliquis, CVA, HTN, HLD, Type 2DM, CKD IIIa and b/l carpal tunnel s/p b/l CTR, admitted for a/c CHF in the setting of atrial flutter w/ RVR   Assessment/Plan   1. Acute on Chronic Bventricular  Heart Failure - Long standing since 2009. Ischemic CM. Also w/ LBBB s/p CRT-D  - Last echo 11/23 EF 30-35%, global HK, mild LVH, RV mildly reduced - admitted w/ NYHA Class IIIb symptoms and volume overload, in setting of dietary and medication noncompliance. This is further c/b rapid AFL.  - w/ h/o b/l carpal tunnel, consider w/u for TTR amyloid w/ PYP scan. - GDMT PTA, only Coreg 25 mg bid per med rec. Had previously been on [place], Entresto and digoxin. Not clear when/why meds were stopped - Echo EF 20-25% RV  severely reduced, mod mitral stenosis.  - TEE EF 30-35% RV mildly reduced mild MR/MS. Moderate TR - Stable off Milrinone. Co-ox 75. - sCr 1.27>1.33>1.44. Suspect she is dry. Hold diuresis today. Start Lasix 40 mg PO tomorrow.  - Continue digoxin 0.125 mg daily - Continue spiro 12.5 mg daily. Plan to increase as outpatient as able.  - Continue Entresto 24-26 mg bid - Start Toprol 12.5 mg daily - Start Jardiance 10 mg daily   2. AFL w/ RVR, persistent - h/o Afib/Flutter, on Eliquis - AFL x 4 days per device interrogation. Suspect CHF triggered arrhythmia as HF symptoms present x 2 wks - s/p DC-CV on 12/13. Remains in SR - On amio to 200 po bid  - On Eliquis 5 mg bid - Start Toprol 12.5 mg daily - Recommend out patient sleep study    3. AKI on CKD IIIa - B/l SCr 1.1 - Admit SCr 1.95 - Today 1.44. Hold diuresis as above. Resume Lasix 40 mg PO tomorrow.    4. H/o PE/DVT - s/p IVC filter. Historically poor compliance with Eliquis. - D-dimer elevated, 6.05 - V/Q scan low likelihood for PE  - Continue Eliquis   5. LBBB - s/p CRT-D - only 90% Bi-V pacing on admit, hopefully will improve once back in NSR  - no change   6. CAD - remote inferior infarct, BMS prox and mid RCA  - known CTO/ISR of RCA by cath 2016, treated medically  - HS Trop mildly elevated with flat trend, not consistent with ACS.  - No s/s angina - LDL (12/24) 146 - Continue ASA + statin   7. Mitral  Valve Disorder - s/p MV annuloplasty in 1996 - Echo 11/23 w/ mild MS, mG 4 mmHg - Mod mitral  stenosis. Mean gradient 6.7 - MS gradient mild on TEE   8. Suspected OSA - h/o snoring  - recommend out patient sleep study   9. Hypokalemia/hypomagnesemia - resolved.  Heart failure team will sign off as of 12/20/22  HF Team Medication Recommendations for Home: Amiodarone 200 mg bid Eliquis 5 mg bid Asprin 81 mg daily Lipitor 40 mg daily Digoxin 0.125 mg daily Jardiance 10 mg daily Lasix 40 mg daily (Resume  tomorrow) Metoprolol succinate 12.5 mg daily (Stop Coreg) Entresto 24-26 mg bid Spirolactone 12.5 mg daily  Other recommendations (Labs,testing, etc): Repeat labs at follow up HF appointment  Follow up as an outpatient: [facility] 01/10/23 at 2pm  Length of Stay: 7  [place] Cameran Ahmed, NP  12/20/2022, 9:41 AM  Advanced Heart Failure Team Pager [phone] (M-F; 7a - 5p)  Please contact [facility] for night-coverage after hours (5p -7a ) and weekends on [email or web address]

## 2022-12-20 NOTE — Discharge Summary (Addendum)
Physician Discharge Summary   Patient: Shelley Douglas MRN: [id number] DOB: [birth year]  Admit date:     12/13/2022  Discharge date: 12/20/22  Discharge Physician: York Ram Carola Viramontes   PCP: Rometta Emery, MD   Recommendations at discharge:    Patient will continue amiodarone 200 mg bid for atrial fibrillation rhythm and rate control. Added digoxin, Entresto, spironolactone and empagliflozin for guideline directed medical therapy for heart failure. Diuresis with furosemide 40 mg po daily.  Follow up renal function and electrolytes in 7 days  Follow up with Dr Mikeal Hawthorne in 7 to 10 days. Follow up with Cardiology as scheduled.   Discharge Diagnoses: Principal Problem:   Atrial flutter with rapid ventricular response (HCC) Active Problems:   Acute on chronic systolic CHF (congestive heart failure) (HCC)   Acute kidney injury superimposed on chronic kidney disease (HCC)   CAD (coronary artery disease)/HLD   Essential hypertension   Cerebral thrombosis with cerebral infarction   Type 2 diabetes mellitus with hyperlipidemia (HCC)   Obesity, class 1  Resolved Problems:   * No resolved hospital problems. [facility] Course: Shelley Douglas was admitted to the hospital with the working diagnosis of atrial fibrillation with rapid ventricular response, complicated with heart failure.   72 yo female with the past medical history of heart failure, atrial fibrillation, ventricular tachycardia, coronary artery disease, hypertension, dyslipidemia, CVA, T2DM, pulmonary embolism and DVT who presented with dyspnea and palpitations. Reported 2 days of progressive dyspnea, associated with exertional chest pressure, palpitations, orthopnea and lower extremity edema. Apparently she has been not adherent to her medications at home. On her initial physical examination her blood pressure was 125/101, HR 132, RR 21 and 02 saturation 90%, lungs with no wheezing or rales, no rhonchi, heart with S1 and S2  present, irregular with no gallops, or murmurs, abdomen with no distention and positive lower extremity edema.   Na 138, K 4.2 Cl 107 bicarbonate 22, glucose 150, bun 36 cr 1.95  BNP 1,062 High sensitive troponin 32 and 36  Wbc 5.8 hgb 12.6 plt 222  D dimer 6.0   Chest radiograph with cardiomegaly, bilateral hilar vascular congestion, with cephalization of the vasculature, positive fluid in the right fissure and small bilateral pleural effusions. Pacemaker defibrillator in place with one right atrial lead and biventricular leads.  Sternotomy wires in place.   EKG 129 bpm, right axis deviation, normal intervals, atrial flutter rhythm with one PVC, poor R R wave progression with no significant ST segment or  T wave changes.   Patient was placed on amiodarone drip for rate control.  12/11 plan for TEE cardioversion when improved volume status.  12/12 continue volume overloaded,  added milrinone for low output heart failure.  12/13 direct current cardioversion today. Improved perfusion.  12/14 stopped milrinone.  12/15 patient continue on sinus rhythm and euvolemic state. Possible discharge home tomorrow.  12/16 plan for discharge home today and have close follow up as outpatient.   Assessment and Plan: * Atrial flutter with rapid ventricular response (HCC) Patient required amiodarone IV loading and direct current cardioversion, now has converted to sinus rhythm.   Telemetry with atrial sensing and ventricular pacing.   Plan to continue with oral amiodarone and anticoagulation with apixaban,  Continue carvedilol for B blockade.   Acute on chronic systolic CHF (congestive heart failure) (HCC) Echocardiogram with reduced LV systolic function with EF 20 to 25 %, global hypokinesis, LV cavity with severe dilatation, mild LVH, RV systolic function with severe  reduction, RV cavity with moderate enlargement, RVSP 48.9 mmHg, LA and RA with severe dilatation, moderate to severe mitral valve  regurgitation (it has been replaced/repair), moderate to severe tricuspid regurgitation   Acute on chronic core pulmonale  Pulmonary hypertension   Patient was placed on IV furosemide for diuresis and inotropic support with milrinone, negative fluid balance was achieved, - 5,760 ml, with significant improvement in her symptoms.   Plan to continue medical therapy with digoxin, carvedilol and spironolactone.  Diuresis with torsemide 40 mg po daily.  SGLT 2 inh.  Limited pharmacologic therapy due to risk of hypotension.   Acute kidney injury superimposed on chronic kidney disease (HCC) Hyponatremia, hypokalemia  CKD stage 2   Volume status has improved, at the time of her discharge her serum cr is 1,4 with K at 4,4 and serum bicarbonate at 32.  Na 134 and Mg 2.3   Continue diuresis with oral torsemide and follow up renal function as outpatient.   CAD (coronary artery disease)/HLD No chest pain,   Essential hypertension Continue blood pressure control with carvedilol.    Cerebral thrombosis with cerebral infarction Continue anticoagulation with apixaban and continue with statin.   Type 2 diabetes mellitus with hyperlipidemia (HCC) Uncontrolled hyperglycemia.  Patient required insulin sliding scale and low dose basal dosing for glucose control.  Patient has been placed on SGLT 2 inh.  Follow up as outpatient.   Continue with statin therapy.   Obesity, class 1 Calculated BMI is 32.2          Consultants: cardiology  Procedures performed: direct current cardioversion   Disposition: Home Diet recommendation:  Cardiac and Carb modified diet DISCHARGE MEDICATION: Allergies as of 12/20/2022       Reactions   Plavix [clopidogrel Bisulfate] Rash        Medication List     STOP taking these medications    carvedilol 25 MG tablet Commonly known as: COREG       TAKE these medications    amiodarone 200 MG tablet Commonly known as: PACERONE Take 1 tablet (200  mg total) by mouth 2 (two) times daily.   apixaban 5 MG Tabs tablet Commonly known as: ELIQUIS Take 1 tablet (5 mg total) by mouth 2 (two) times daily.   aspirin EC 81 MG tablet Take 1 tablet (81 mg total) by mouth daily. Swallow whole.   atorvastatin 40 MG tablet Commonly known as: LIPITOR Take 1 tablet (40 mg total) by mouth daily. Start taking on: December 21, 2022   digoxin 0.125 MG tablet Commonly known as: LANOXIN Take 1 tablet (0.125 mg total) by mouth daily. Start taking on: December 21, 2022   empagliflozin 10 MG Tabs tablet Commonly known as: JARDIANCE Take 1 tablet (10 mg total) by mouth daily. Start taking on: December 21, 2022   furosemide 40 MG tablet Commonly known as: LASIX Take 1 tablet (40 mg total) by mouth daily. Start taking on: December 21, 2022 What changed:  how much to take how to take this when to take this additional instructions   metoprolol succinate 25 MG 24 hr tablet Commonly known as: TOPROL-XL Take 0.5 tablets (12.5 mg total) by mouth daily. Start taking on: December 21, 2022   sacubitril-valsartan 24-26 MG Commonly known as: ENTRESTO Take 1 tablet by mouth 2 (two) times daily.   spironolactone 25 MG tablet Commonly known as: ALDACTONE Take 0.5 tablets (12.5 mg total) by mouth daily. Start taking on: December 21, 2022  Durable Medical Equipment  (From admission, onward)           Start     Ordered   12/16/22 1435  For home use only DME 4 wheeled rolling walker with seat  Once       Question Answer Comment  Patient needs a walker to treat with the following condition Physical deconditioning   Patient needs a walker to treat with the following condition Heart failure (HCC)      12/16/22 1435            Follow-up Information     [facility] Follow up on 01/10/2023.   Specialty: Cardiology Why: at 2:00 Contact information: [address] [place] [address] [phone]               Discharge Exam: Ceasar Mons Weights   12/18/22 1746 12/19/22 0422 12/20/22 0541  Weight: 89.2 kg 88.8 kg 87 kg   BP 115/67 (BP Location: Left Arm)   Pulse 73   Temp 98.6 F (37 C) (Oral)   Resp 16   Ht 5\' 6"  (1.676 m)   Wt 87 kg   SpO2 97%   BMI 30.96 kg/m   Patient is feeling well, no dyspnea or chest pain, no PND or orthopnea, no lower extremity edema  Neurology awake and alert ENT with mild pallor with no icterus Cardiovascular with S1 and S2 present and regular with no gallops, or rubs, positive systolic murmur at the apex No JVD  Respiratory with no rales or wheezing, no rhonchi Abdomen with no distention  No lower extremity edema   Condition at discharge: stable  The results of significant diagnostics from this hospitalization (including imaging, microbiology, ancillary and laboratory) are listed below for reference.   Imaging Studies: EP STUDY Result Date: 12/17/2022 See surgical note for result.  VAS [place] LOWER EXTREMITY VENOUS (DVT) Result Date: 12/15/2022  Lower Venous DVT Study Patient Name:  Shelley Douglas  Date of Exam:   12/14/2022 Medical Rec #: [id number]           Accession #:    [id number] Date of Birth: 30-May-1950           Patient Gender: F Patient Age:   76 years Exam Location:  [facility] Procedure:      VAS [place] LOWER EXTREMITY VENOUS (DVT) Referring Phys: Dolly Rias --------------------------------------------------------------------------------  Indications: Swelling.  Risk Factors: Hx of DVT & PE. Anticoagulation: Eliquis. Limitations: Body habitus and poor ultrasound/tissue interface. Comparison Study: Previous exam on 09/25/2014 was positive for BLE chronic DVT. Performing Technologist: Ernestene Mention RVT, RDMS  Examination Guidelines: A complete evaluation includes B-mode imaging, spectral Doppler, color Doppler, and power Doppler as needed of all accessible portions of each  vessel. Bilateral testing is considered an integral part of a complete examination. Limited examinations for reoccurring indications may be performed as noted. The reflux portion of the exam is performed with the patient in reverse Trendelenburg.  +---------+---------------+---------+-----------+----------+--------------+ RIGHT    CompressibilityPhasicitySpontaneityPropertiesThrombus Aging +---------+---------------+---------+-----------+----------+--------------+ CFV      Full           No       Yes                                 +---------+---------------+---------+-----------+----------+--------------+ SFJ      Full                                                        +---------+---------------+---------+-----------+----------+--------------+  FV Prox  Full           No       Yes                                 +---------+---------------+---------+-----------+----------+--------------+ FV Mid   Full           No       Yes                                 +---------+---------------+---------+-----------+----------+--------------+ FV DistalFull           No       Yes                                 +---------+---------------+---------+-----------+----------+--------------+ PFV      Full                                                        +---------+---------------+---------+-----------+----------+--------------+ POP      Partial        No       Yes                  Chronic        +---------+---------------+---------+-----------+----------+--------------+ PTV      Full                                                        +---------+---------------+---------+-----------+----------+--------------+ PERO     Full                                                        +---------+---------------+---------+-----------+----------+--------------+ Abnormal waveforms noted - likely due to elevated right heart pressure / CHF   +---------+---------------+---------+-----------+----------+--------------+ LEFT     CompressibilityPhasicitySpontaneityPropertiesThrombus Aging +---------+---------------+---------+-----------+----------+--------------+ CFV      Full           No       Yes                                 +---------+---------------+---------+-----------+----------+--------------+ SFJ      Full                                                        +---------+---------------+---------+-----------+----------+--------------+ FV Prox  Full           No       Yes                                 +---------+---------------+---------+-----------+----------+--------------+  FV Mid   Full           No       Yes                                 +---------+---------------+---------+-----------+----------+--------------+ FV DistalFull           No       Yes                                 +---------+---------------+---------+-----------+----------+--------------+ PFV      Full                                                        +---------+---------------+---------+-----------+----------+--------------+ POP      Full           No       Yes                                 +---------+---------------+---------+-----------+----------+--------------+ PTV      Full                                                        +---------+---------------+---------+-----------+----------+--------------+ PERO     Full                                                        +---------+---------------+---------+-----------+----------+--------------+ Abnormal waveforms noted - likely due to elevated right heart pressure / CHF    Summary: BILATERAL: -No evidence of popliteal cyst, bilaterally. 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  LEFT: - There is no evidence of deep vein thrombosis in the lower extremity.  *See table(s) above for  measurements and observations. Electronically signed by Coral Else MD on 12/15/2022 at 9:05:37 PM.    Final    NM Pulmonary Perfusion Result Date: 12/14/2022 CLINICAL DATA:  Acute on chronic systolic heart failure, PE suspected EXAM: NUCLEAR MEDICINE PERFUSION LUNG SCAN TECHNIQUE: Perfusion images were obtained in multiple projections after intravenous injection of radiopharmaceutical. Ventilation scans intentionally deferred if perfusion scan and chest x-ray adequate for interpretation during COVID 19 epidemic. RADIOPHARMACEUTICALS:  4.4 mCi Tc-4m MAA IV COMPARISON:  Radiograph 12/13/2022 FINDINGS: Heterogenous distribution of radiopharmaceutical with no segmental or subsegmental unexplained defects to suggest acute PE. Left anterior defect from AICD generator noted. IMPRESSION: Low likelihood ratio for pulmonary embolus. Electronically Signed   By: Corlis Leak M.D.   On: 12/14/2022 15:37   [place] EKG SITE RITE Result Date: 12/14/2022 If Site Rite image not attached, placement could not be confirmed due to current cardiac rhythm.  ECHOCARDIOGRAM COMPLETE Result Date: 12/14/2022    ECHOCARDIOGRAM REPORT   Patient Name:   Shelley Douglas Date of Exam: 12/14/2022 Medical Rec #:  [id number]  Height:       66.0 in Accession #:    [id number]         Weight:       205.0 lb Date of Birth:  [birth year]          BSA:          2.021 m Patient Age:    72 years           BP:           128/81 mmHg Patient Gender: F                  HR:           73 bpm. Exam Location:  Inpatient Procedure: 2D Echo, Color Doppler and Cardiac Doppler Indications:    I50.9* Heart failure (unspecified)  History:        Patient has prior history of Echocardiogram examinations, most                 recent 11/17/2021. CHF, Pacemaker and Defibrillator,                 Arrythmias:Atrial Flutter and Atrial Fibrillation; Risk                 Factors:Hypertension and Diabetes. MV Annuloplasty prior to                 2013.                   Mitral Valve: prosthetic annuloplasty ring valve is present in                 the mitral position. Procedure Date: prior to 2013.  Sonographer:    Irving Burton Senior RDCS Referring Phys: [id number] Dolly Rias IMPRESSIONS  1. Left ventricular ejection fraction, by estimation, is 20 to 25%. Left ventricular ejection fraction by 2D MOD biplane is 22.3 %. The left ventricle has severely decreased function. The left ventricle demonstrates global hypokinesis. The left ventricular internal cavity size was severely dilated. There is mild concentric left ventricular hypertrophy. Left ventricular diastolic function could not be evaluated.  2. Right ventricular systolic function is severely reduced. The right ventricular size is moderately enlarged. There is moderately elevated pulmonary artery systolic pressure. The estimated right ventricular systolic pressure is 48.9 mmHg.  3. Left atrial size was severely dilated.  4. Right atrial size was severely dilated.  5. The mitral valve has been repaired/replaced. Moderate to severe mitral valve regurgitation. Moderate mitral stenosis. The mean mitral valve gradient is 6.7 mmHg with average heart rate of 68 bpm. There is a prosthetic annuloplasty ring present in the  mitral position. Procedure Date: prior to 2013.  6. The tricuspid valve is abnormal. Tricuspid valve regurgitation is moderate to severe.  7. The aortic valve has an indeterminant number of cusps. Aortic valve regurgitation is not visualized. No aortic stenosis is present.  8. The inferior vena cava is dilated in size with <50% respiratory variability, suggesting right atrial pressure of 15 mmHg. Comparison(s): Prior images reviewed side by side. The left ventricular function is worsened. The right ventricular systolic function is worse. Mitral and tricuspid insufficiency ar worse. Right atrail pressure and pulmonary artery pressure are higher. FINDINGS  Left Ventricle: Left ventricular ejection fraction, by estimation,  is 20 to 25%. Left ventricular ejection fraction by 2D MOD biplane is 22.3 %. The left ventricle has severely decreased function. The left ventricle demonstrates global hypokinesis. The left ventricular internal  cavity size was severely dilated. There is mild concentric left ventricular hypertrophy. Abnormal (paradoxical) septal motion consistent with post-operative status. Left ventricular diastolic function could not be evaluated due to mitral valve repair. Left ventricular diastolic function could not be evaluated. Right Ventricle: The right ventricular size is moderately enlarged. No increase in right ventricular wall thickness. Right ventricular systolic function is severely reduced. There is moderately elevated pulmonary artery systolic pressure. The tricuspid regurgitant velocity is 2.91 m/s, and with an assumed right atrial pressure of 15 mmHg, the estimated right ventricular systolic pressure is 48.9 mmHg. Left Atrium: Left atrial size was severely dilated. Right Atrium: Right atrial size was severely dilated. Pericardium: Trivial pericardial effusion is present. Mitral Valve: The mitral valve has been repaired/replaced. Moderate to severe mitral valve regurgitation. There is a prosthetic annuloplasty ring present in the mitral position. Procedure Date: prior to 2013. Moderate mitral valve stenosis. MV peak gradient, 14.6 mmHg. The mean mitral valve gradient is 6.7 mmHg with average heart rate of 68 bpm. Tricuspid Valve: The tricuspid valve is abnormal. Tricuspid valve regurgitation is moderate to severe. The flow in the hepatic veins is reversed during ventricular systole. Aortic Valve: The aortic valve has an indeterminant number of cusps. Aortic valve regurgitation is not visualized. No aortic stenosis is present. Pulmonic Valve: The pulmonic valve was normal in structure. Pulmonic valve regurgitation is trivial. No evidence of pulmonic stenosis. Aorta: The aortic root and ascending aorta are  structurally normal, with no evidence of dilitation. Venous: The inferior vena cava is dilated in size with less than 50% respiratory variability, suggesting right atrial pressure of 15 mmHg. The inferior vena cava and the hepatic vein show a pattern of systolic flow reversal, suggestive of tricuspid regurgitation. IAS/Shunts: No atrial level shunt detected by color flow Doppler. Additional Comments: A device lead is visualized in the right atrium and right ventricle.  LEFT VENTRICLE PLAX 2D                        Biplane EF (MOD) LVIDd:         4.90 cm         LV Biplane EF:   Left LVIDs:         4.70 cm                          ventricular LV PW:         1.14 cm                          ejection LV IVS:        1.30 cm                          fraction by LVOT diam:     2.00 cm                          2D MOD LV SV:         39                               biplane is LV SV Index:   19  22.3 %. LVOT Area:     3.14 cm  LV Volumes (MOD) LV vol d, MOD    150.0 ml A2C: LV vol d, MOD    122.5 ml A4C: LV vol s, MOD    114.0 ml A2C: LV vol s, MOD    101.7 ml A4C: LV SV MOD A2C:   36.0 ml LV SV MOD A4C:   122.5 ml LV SV MOD BP:    31.0 ml RIGHT VENTRICLE RV S prime:     8.05 cm/s TAPSE (M-mode): 1.0 cm LEFT ATRIUM             Index        RIGHT ATRIUM           Index LA diam:        4.40 cm 2.18 cm/m   RA Area:     35.75 cm LA Vol (A2C):   77.1 ml 38.15 ml/m  RA Volume:   149.00 ml 73.73 ml/m LA Vol (A4C):   77.2 ml 38.20 ml/m LA Biplane Vol: 78.3 ml 38.75 ml/m  AORTIC VALVE LVOT Vmax:   78.97 cm/s LVOT Vmean:  55.867 cm/s LVOT VTI:    0.123 m  AORTA Ao Root diam: 3.00 cm Ao Asc diam:  3.10 cm MITRAL VALVE                  TRICUSPID VALVE MV Area VTI:  1.01 cm        TR Peak grad:   33.9 mmHg MV Peak grad: 14.6 mmHg       TR Vmax:        291.00 cm/s MV Mean grad: 6.7 mmHg MV Vmax:      1.91 m/s        SHUNTS MV Vmean:     112.0 cm/s      Systemic VTI:  0.12 m MR Peak grad:    62.1 mmHg     Systemic Diam: 2.00 cm MR Mean grad:    45.7 mmHg MR Vmax:         394.00 cm/s MR Vmean:        292.7 cm/s MR PISA:         2.26 cm MR PISA Eff ROA: 22 mm MR PISA Radius:  0.60 cm Rachelle Hora Croitoru MD Electronically signed by Thurmon Fair MD Signature Date/Time: 12/14/2022/1:41:27 PM    Final    DG Chest Port 1 View Result Date: 12/13/2022 CLINICAL DATA:  Short of breath EXAM: PORTABLE CHEST 1 VIEW COMPARISON:  10/11/2020 FINDINGS: Single frontal view of the chest demonstrates stable enlargement the cardiac silhouette allowing for differences in technique and positioning. Multi lead pacer/AICD again noted. Small right pleural effusion, with fluid again seen in the minor fissure. No airspace disease or pneumothorax. No acute bony abnormalities. IMPRESSION: 1. Small right pleural effusion, with persistent fluid in the minor fissure. 2. Stable enlarged cardiac silhouette. Electronically Signed   By: Sharlet Salina M.D.   On: 12/13/2022 15:50   CUP PACEART REMOTE DEVICE CHECK Result Date: 12/07/2022 Monthly battery check.  RRT 11/29, previously routed to triage Normal device function. HF diagnostics currenlty abnormal, sub-optimal BiV pacing 86% Follow up as scheduled monthly LA, CVRS   Microbiology: Results for orders placed or performed during the hospital encounter of 12/13/22  Surgical pcr screen     Status: Abnormal   Collection Time: 12/16/22  4:27 AM   Specimen: Nasal Mucosa; Nasal Swab  Result Value Ref Range Status  MRSA, PCR NEGATIVE NEGATIVE Final   Staphylococcus aureus POSITIVE (A) NEGATIVE Final    Comment: (NOTE) The Xpert SA Assay (FDA approved for NASAL specimens in patients 62 years of age and older), is one component of a comprehensive surveillance program. It is not intended to diagnose infection nor to guide or monitor treatment. Performed at [facility], [address], [place], [place] [address]     Labs: CBC: Recent Labs  Lab 12/13/22 1420 12/14/22 0301  12/15/22 0415  WBC 5.8 5.4 4.5  HGB 12.6 11.8* 11.3*  HCT 39.8 37.0 35.7*  MCV 89.2 88.7 89.0  PLT 222 213 203   Basic Metabolic Panel: Recent Labs  Lab 12/14/22 0301 12/15/22 0415 12/16/22 0500 12/17/22 0500 12/18/22 0418 12/19/22 0420 12/20/22 0413  NA 138   < > 136 134* 132* 134* 134*  K 3.8   < > 3.9 3.4* 4.2 4.3 4.4  CL 104   < > 102 99 98 93* 96*  CO2 25   < > 25 26 30 30  32  GLUCOSE 134*   < > 171* 246* 296* 241* 155*  BUN 33*   < > 24* 18 17 17 22   CREATININE 1.51*   < > 1.42* 1.17* 1.27* 1.33* 1.44*  CALCIUM 10.3   < > 9.8 9.0 9.3 10.3 10.6*  MG 1.8  --  1.8 1.8 2.1 2.1 2.3  PHOS 3.3  --   --   --   --   --   --    < > = values in this interval not displayed.   Liver Function Tests: No results for input(s): "AST", "ALT", "ALKPHOS", "BILITOT", "PROT", "ALBUMIN" in the last 168 hours. CBG: Recent Labs  Lab 12/19/22 0759 12/19/22 1216 12/19/22 1645 12/19/22 2057 12/20/22 0745  GLUCAP 197* 91 134* 183* 176*    Discharge time spent: greater than 30 minutes.  Signed: Coralie Keens, MD [facility] 12/20/2022

## 2022-12-20 NOTE — Plan of Care (Signed)
Problem: Education: Goal: Ability to describe self-care measures that may prevent or decrease complications (Diabetes Survival Skills Education) will improve 12/20/2022 1222 by Sylvan Cheese, RN Outcome: Adequate for Discharge 12/20/2022 1222 by Sylvan Cheese, RN Outcome: Adequate for Discharge Goal: Individualized Educational Video(s) 12/20/2022 1222 by Sylvan Cheese, RN Outcome: Adequate for Discharge 12/20/2022 1222 by Sylvan Cheese, RN Outcome: Adequate for Discharge   Problem: Fluid Volume: Goal: Ability to maintain a balanced intake and output will improve 12/20/2022 1222 by Sylvan Cheese, RN Outcome: Adequate for Discharge 12/20/2022 1222 by Sylvan Cheese, RN Outcome: Adequate for Discharge   Problem: Health Behavior/Discharge Planning: Goal: Ability to identify and utilize available resources and services will improve 12/20/2022 1222 by Sylvan Cheese, RN Outcome: Adequate for Discharge 12/20/2022 1222 by Sylvan Cheese, RN Outcome: Adequate for Discharge Goal: Ability to manage health-related needs will improve 12/20/2022 1222 by Sylvan Cheese, RN Outcome: Adequate for Discharge 12/20/2022 1222 by Sylvan Cheese, RN Outcome: Adequate for Discharge   Problem: Metabolic: Goal: Ability to maintain appropriate glucose levels will improve 12/20/2022 1222 by Sylvan Cheese, RN Outcome: Adequate for Discharge 12/20/2022 1222 by Sylvan Cheese, RN Outcome: Adequate for Discharge   Problem: Nutritional: Goal: Maintenance of adequate nutrition will improve 12/20/2022 1222 by Sylvan Cheese, RN Outcome: Adequate for Discharge 12/20/2022 1222 by Sylvan Cheese, RN Outcome: Adequate for Discharge Goal: Progress toward achieving an optimal weight will improve 12/20/2022 1222 by Sylvan Cheese, RN Outcome: Adequate for Discharge 12/20/2022 1222 by Sylvan Cheese, RN Outcome: Adequate for Discharge    Problem: Skin Integrity: Goal: Risk for impaired skin integrity will decrease 12/20/2022 1222 by Sylvan Cheese, RN Outcome: Adequate for Discharge 12/20/2022 1222 by Sylvan Cheese, RN Outcome: Adequate for Discharge   Problem: Health Behavior/Discharge Planning: Goal: Ability to manage health-related needs will improve 12/20/2022 1222 by Sylvan Cheese, RN Outcome: Adequate for Discharge 12/20/2022 1222 by Sylvan Cheese, RN Outcome: Adequate for Discharge   Problem: Clinical Measurements: Goal: Ability to maintain clinical measurements within normal limits will improve 12/20/2022 1222 by Sylvan Cheese, RN Outcome: Adequate for Discharge 12/20/2022 1222 by Sylvan Cheese, RN Outcome: Adequate for Discharge Goal: Will remain free from infection 12/20/2022 1222 by Sylvan Cheese, RN Outcome: Adequate for Discharge 12/20/2022 1222 by Sylvan Cheese, RN Outcome: Adequate for Discharge Goal: Diagnostic test results will improve 12/20/2022 1222 by Sylvan Cheese, RN Outcome: Adequate for Discharge 12/20/2022 1222 by Sylvan Cheese, RN Outcome: Adequate for Discharge Goal: Respiratory complications will improve 12/20/2022 1222 by Sylvan Cheese, RN Outcome: Adequate for Discharge 12/20/2022 1222 by Sylvan Cheese, RN Outcome: Adequate for Discharge Goal: Cardiovascular complication will be avoided 12/20/2022 1222 by Sylvan Cheese, RN Outcome: Adequate for Discharge 12/20/2022 1222 by Sylvan Cheese, RN Outcome: Adequate for Discharge   Problem: Activity: Goal: Risk for activity intolerance will decrease 12/20/2022 1222 by Sylvan Cheese, RN Outcome: Adequate for Discharge 12/20/2022 1222 by Sylvan Cheese, RN Outcome: Adequate for Discharge   Problem: Nutrition: Goal: Adequate nutrition will be maintained 12/20/2022 1222 by Sylvan Cheese, RN Outcome: Adequate for Discharge 12/20/2022 1222 by Sylvan Cheese, RN Outcome: Adequate for Discharge   Problem: Coping: Goal: Level of anxiety will decrease 12/20/2022 1222 by Sylvan Cheese, RN Outcome: Adequate for Discharge 12/20/2022 1222 by Sylvan Cheese, RN Outcome: Adequate for Discharge   Problem: Elimination: Goal: Will  not experience complications related to bowel motility 12/20/2022 1222 by Sylvan Cheese, RN Outcome: Adequate for Discharge 12/20/2022 1222 by Sylvan Cheese, RN Outcome: Adequate for Discharge   Problem: Pain Management: Goal: General experience of comfort will improve 12/20/2022 1222 by Sylvan Cheese, RN Outcome: Adequate for Discharge 12/20/2022 1222 by Sylvan Cheese, RN Outcome: Adequate for Discharge   Problem: Safety: Goal: Ability to remain free from injury will improve 12/20/2022 1222 by Sylvan Cheese, RN Outcome: Adequate for Discharge 12/20/2022 1222 by Sylvan Cheese, RN Outcome: Adequate for Discharge   Problem: Skin Integrity: Goal: Risk for impaired skin integrity will decrease 12/20/2022 1222 by Sylvan Cheese, RN Outcome: Adequate for Discharge 12/20/2022 1222 by Sylvan Cheese, RN Outcome: Adequate for Discharge   Problem: Education: Goal: Ability to demonstrate management of disease process will improve 12/20/2022 1222 by Sylvan Cheese, RN Outcome: Adequate for Discharge 12/20/2022 1222 by Sylvan Cheese, RN Outcome: Adequate for Discharge Goal: Ability to verbalize understanding of medication therapies will improve 12/20/2022 1222 by Sylvan Cheese, RN Outcome: Adequate for Discharge 12/20/2022 1222 by Sylvan Cheese, RN Outcome: Adequate for Discharge Goal: Individualized Educational Video(s) 12/20/2022 1222 by Sylvan Cheese, RN Outcome: Adequate for Discharge 12/20/2022 1222 by Sylvan Cheese, RN Outcome: Adequate for Discharge   Problem: Activity: Goal: Capacity to carry out activities will  improve 12/20/2022 1222 by Sylvan Cheese, RN Outcome: Adequate for Discharge 12/20/2022 1222 by Sylvan Cheese, RN Outcome: Adequate for Discharge   Problem: Cardiac: Goal: Ability to achieve and maintain adequate cardiopulmonary perfusion will improve 12/20/2022 1222 by Sylvan Cheese, RN Outcome: Adequate for Discharge 12/20/2022 1222 by Sylvan Cheese, RN Outcome: Adequate for Discharge

## 2022-12-20 NOTE — Progress Notes (Signed)
OT Cancellation Note  Patient Details Name: Shelley Douglas MRN: [id number] DOB: [birth year]   Cancelled Treatment:    Reason Eval/Treat Not Completed: Other (comment) Pt dressed and ready for DC. Noted pt did well with PT and pt currently denies any concerns regarding ADL/IADL mgmt at home.   Lorre Munroe 12/20/2022, 12:49 PM

## 2022-12-20 NOTE — Progress Notes (Signed)
Notified by CCMD pt had 8bts WQRS. Asymptomatic/strip saved. Will continue to monitor. Dierdre Highman, RN

## 2022-12-20 NOTE — TOC Progression Note (Addendum)
Transition of Care [facility]) - Progression Note    Patient Details  Name: Shelley Douglas MRN: [id number] Date of Birth: 1950-04-21  Transition of Care [facility]) CM/SW Contact  Elliot Cousin, RN Phone Number: [phone] 12/20/2022, 11:18 AM  Clinical Narrative:    CM spoke to pt and offered choice for [facility], (medicare.gov list placed on chart and provided to pt). Pt agreeable to [facility] for [facility]. Will need HH PT orders. Pt reports having cane and scale at home.  Educated on importance of daily weight to help manage HF symptoms.  Discussed low sodium/heart healthy diet.  Husband at home to assist with care. Pt declined RW, states she will use her cane.  Contacted [facility] rep, Artavia with new referral. Accepted referral for HH.  PCP's office will call pt on 01/11/23 to schedule hospital follow up appt.   Expected Discharge Plan: Home w Home Health Services Barriers to Discharge: No Barriers Identified  Expected Discharge Plan and Services   Discharge Planning Services: CM Consult Post Acute Care Choice: Home Health Living arrangements for the past 2 months: Mobile Home                           HH Arranged: PT HH Agency: [facility] Date HH Agency Contacted: 12/20/22 Time HH Agency Contacted: 1113 Representative spoke with at [facility] Agency: Adele Dan   Social Determinants of Health (SDOH) Interventions SDOH Screenings   Food Insecurity: No Food Insecurity (12/14/2022)  Housing: High Risk (12/14/2022)  Transportation Needs: No Transportation Needs (12/14/2022)  Utilities: Not At Risk (12/14/2022)  Tobacco Use: Medium Risk (12/13/2022)    Readmission Risk Interventions     No data to display

## 2022-12-20 NOTE — Care Management Important Message (Signed)
Important Message  Patient Details  Name: Shelley Douglas MRN: [id number] Date of Birth: March 16, 1950   Important Message Given:  Yes - Medicare IM     [name] 12/20/2022, 9:38 AM

## 2022-12-20 NOTE — Progress Notes (Signed)
Physical Therapy Treatment Patient Details Name: Shelley Douglas MRN: [id number] DOB: 03/19/1950 Today's Date: 12/20/2022   History of Present Illness Shelley Douglas is a 72 y.o. female presented 12/9 with dyspnea on exertion and exertional chest pain followed by 2 days of rapid HR / palpitations. 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 Underwent TEE for cardioversion 12/13 with success.  PHMx: HFrEF with CRT-D, hx VT, Afib, CAD with PCI, RCA stent, MV annuloplasty, DVT/PE, on AC, CVA, HTN, HLD, DM.    PT Comments  Pt mobilizing well using cane as she does at home. Ready for dc home from PT standpoint.     If plan is discharge home, recommend the following: Help with stairs or ramp for entrance;Assist for transportation   Can travel by private vehicle        Equipment Recommendations  None recommended by PT    Recommendations for Other Services       Precautions / Restrictions Precautions Precautions: None Restrictions Weight Bearing Restrictions Per Provider Order: No     Mobility  Bed Mobility Overal bed mobility: Independent                  Transfers Overall transfer level: Modified independent Equipment used: None Transfers: Sit to/from Stand                  Ambulation/Gait Ambulation/Gait assistance: Modified independent (Device/Increase time) Gait Distance (Feet): 325 Feet Assistive device: Straight cane Gait Pattern/deviations: Step-through pattern, Decreased stride length Gait velocity: decr Gait velocity interpretation: 1.31 - 2.62 ft/sec, indicative of limited community ambulator   General Gait Details: Steady gait with cane   Stairs             Wheelchair Mobility     Tilt Bed    Modified Rankin (Stroke Patients Only)       Balance Overall balance assessment: Mild deficits observed, not formally tested                                           Cognition Arousal: Alert Behavior During Therapy: WFL for tasks assessed/performed Overall Cognitive Status: Within Functional Limits for tasks assessed                                          Exercises      General Comments        Pertinent Vitals/Pain Pain Assessment Pain Assessment: No/denies pain    Home Living                          Prior Function            PT Goals (current goals can now be found in the care plan section) Acute Rehab PT Goals Patient Stated Goal: Go home Progress towards PT goals: Progressing toward goals    Frequency    Min 1X/week      PT Plan      Co-evaluation              AM-PAC PT "6 Clicks" Mobility   Outcome Measure  Help needed turning from your back to your side while in a flat bed  without using bedrails?: None Help needed moving from lying on your back to sitting on the side of a flat bed without using bedrails?: None Help needed moving to and from a bed to a chair (including a wheelchair)?: None Help needed standing up from a chair using your arms (e.g., wheelchair or bedside chair)?: None Help needed to walk in hospital room?: None Help needed climbing 3-5 steps with a railing? : A Little 6 Click Score: 23    End of Session   Activity Tolerance: Patient tolerated treatment well Patient left: in bed;with call bell/phone within reach (sitting EOB)   PT Visit Diagnosis: Other abnormalities of gait and mobility (R26.89);Muscle weakness (generalized) (M62.81)     Time: 4010-2725 PT Time Calculation (min) (ACUTE ONLY): 21 min  Charges:    $Gait Training: 8-22 mins PT General Charges $$ ACUTE PT VISIT: 1 Visit                     [facility] PT Acute Rehabilitation Services Office [phone]    Angelina Ok [facility] 12/20/2022, 11:31 AM

## 2022-12-20 NOTE — Progress Notes (Signed)
VAST consult. Arrived to patient room. Instructed patient on procedure. HOB less than 45*. Pt held breath upon removal. Pressure held for 5 min with no s/sx of bleeding. Pressure drsg applied and instructed to remain in bed for 30 min and report any s/sx of bleeding and put pressure directly to site to stop bleeding. Instructed to keep drsg CDI for 24 hours. Patient VU. Tomasita Morrow, RN VAST

## 2022-12-22 LAB — ECHO TEE
MV M vel: 3.94 m/s
MV Peak grad: 62.1 mm[Hg]

## 2022-12-31 NOTE — Progress Notes (Signed)
 Electrophysiology Office Note:   Date:  01/04/2023  ID:  Shelley Douglas, DOB 07-31-1950, MRN [id number]  Primary Cardiologist: Charlton Haws, MD Primary Heart Failure: None Electrophysiologist: Will Jorja Loa, MD      History of Present Illness:   Shelley Douglas is a 72 y.o. female with h/o chronic sCHF, VT s/p ICD, LBBB, paroxysmal AF, HTN, MV annuloplasty 1996, CAD, CVA, PE/DVT, DM II seen today for routine electrophysiology followup.   Remote monitoring showed RRT met 12/03/22.    Admitted 12/9-12/16/24 for AFLwRVR, decompensated acute on chronic sCHF, AKI requiring diuresis, IV milrinone for low output heart failure. She underwent cardioversion on 12/17/22 with conversion to NSR.   Since last being seen in our clinic the patient reports she is doing "so much better". Before the last admit, she could not walk very far due to shortness of breath. Her breathing is so much better and she is not swollen.      She denies chest pain, palpitations, dyspnea, PND, orthopnea, nausea, vomiting, dizziness, syncope, edema, weight gain, or early satiety.   Review of systems complete and found to be negative unless listed in HPI.   EP Information / Studies Reviewed:    EKG is ordered today. Personal review as below.  EKG Interpretation Date/Time:  Tuesday January 04 2023 13:18:30 EST Ventricular Rate:  59 PR Interval:  132 QRS Duration:  138 QT Interval:  456 QTC Calculation: 451 R Axis:   265  Text Interpretation: Atrial-sensed ventricular-paced rhythm Biventricular pacemaker detected Confirmed by Canary Brim ([id number]) on 01/04/2023 1:29:41 PM   ICD Interrogation-  reviewed in detail today,  See PACEART report.  Device History: Medtronic BiV ICD implanted 07/24/07 (ICD) with upgrade to CRT-D 07/01/15 for VT, sCHF History of appropriate therapy: No History of AAD therapy: No   Studies:  TEE 12/17/22 > LVEF 30-35%, LAA surgically absent, LA severely dilated, RA severely  dilated, MV repaired/replaced with trivial regurgitation, mild MS, septal leaflet appears trapped by the device lead with severe TR, TV regurgitation moderate to severe   Arrhythmia / AAD VT  Atrial Fibrillation  AFL   Risk Assessment/Calculations:    CHA2DS2-VASc Score =     This indicates a  % annual risk of stroke. The patient's score is based upon:              Physical Exam:   VS:  BP 126/60 (BP Location: Left Arm, Patient Position: Sitting, Cuff Size: Large)   Pulse (!) 59   Ht 5\' 6"  (1.676 m)   Wt 198 lb 3.2 oz (89.9 kg)   SpO2 96%   BMI 31.99 kg/m    Wt Readings from Last 3 Encounters:  01/04/23 198 lb 3.2 oz (89.9 kg)  12/20/22 191 lb 12.8 oz (87 kg)  11/29/22 205 lb 6.4 oz (93.2 kg)     GEN: Well nourished, well developed in no acute distress NECK: No JVD; No carotid bruits CARDIAC: Regular rate and rhythm, no murmurs, rubs, gallops RESPIRATORY:  Clear to auscultation without rales, wheezing or rhonchi  ABDOMEN: Soft, non-tender, non-distended EXTREMITIES:  No edema; No deformity   ASSESSMENT AND PLAN:    Chronic Systolic Dysfunction s/p Medtronic CRT-D LBBB  -euvolemic today -Stable on an appropriate medical regimen / GDMT per Advanced HF Team -Normal ICD function -See Pace Art report -No changes today -99.3% BiV pacing  -device at [facility] as of 12/03/22, procedure reviewed for generator change with patient, discussed med holds before procedure > specifically eliquis and  jardiance  -pre-procedure labs > CMP (for amio & procedure), CBC  Paroxysmal AF AFL -OAC for stroke prophylaxis  -amiodarone 200mg  BID > consider reduction to 200 mg daily if no further AF/AFL at device check visit in 3 months  Secondary Hypercoagulable State  -continue Eliquis 5 mg BID, dose reviewed and appropriate by age/wt   Hx PE/DVT  S/p IVC Filter, historically poor compliance with eliquis  -recent V/Q scan with low likelihood of PE   Disposition:   Follow up with Dr.  Elberta Fortis on 01/27/23 for generator change.    Signed, Canary Brim, MSN, APRN, NP-C, AGACNP-BC [facility] - Electrophysiology  01/04/2023, 2:19 PM

## 2022-12-31 NOTE — H&P (View-Only) (Signed)
Electrophysiology Office Note:   Date:  01/04/2023  ID:  MURRELL PAVEY, DOB 07-31-1950, MRN [id number]  Primary Cardiologist: Charlton Haws, MD Primary Heart Failure: None Electrophysiologist: Will Jorja Loa, MD      History of Present Illness:   Shelley Douglas is a 72 y.o. female with h/o chronic sCHF, VT s/p ICD, LBBB, paroxysmal AF, HTN, MV annuloplasty 1996, CAD, CVA, PE/DVT, DM II seen today for routine electrophysiology followup.   Remote monitoring showed RRT met 12/03/22.    Admitted 12/9-12/16/24 for AFLwRVR, decompensated acute on chronic sCHF, AKI requiring diuresis, IV milrinone for low output heart failure. She underwent cardioversion on 12/17/22 with conversion to NSR.   Since last being seen in our clinic the patient reports she is doing "so much better". Before the last admit, she could not walk very far due to shortness of breath. Her breathing is so much better and she is not swollen.      She denies chest pain, palpitations, dyspnea, PND, orthopnea, nausea, vomiting, dizziness, syncope, edema, weight gain, or early satiety.   Review of systems complete and found to be negative unless listed in HPI.   EP Information / Studies Reviewed:    EKG is ordered today. Personal review as below.  EKG Interpretation Date/Time:  Tuesday January 04 2023 13:18:30 EST Ventricular Rate:  59 PR Interval:  132 QRS Duration:  138 QT Interval:  456 QTC Calculation: 451 R Axis:   265  Text Interpretation: Atrial-sensed ventricular-paced rhythm Biventricular pacemaker detected Confirmed by Canary Brim ([id number]) on 01/04/2023 1:29:41 PM   ICD Interrogation-  reviewed in detail today,  See PACEART report.  Device History: Medtronic BiV ICD implanted 07/24/07 (ICD) with upgrade to CRT-D 07/01/15 for VT, sCHF History of appropriate therapy: No History of AAD therapy: No   Studies:  TEE 12/17/22 > LVEF 30-35%, LAA surgically absent, LA severely dilated, RA severely  dilated, MV repaired/replaced with trivial regurgitation, mild MS, septal leaflet appears trapped by the device lead with severe TR, TV regurgitation moderate to severe   Arrhythmia / AAD VT  Atrial Fibrillation  AFL   Risk Assessment/Calculations:    CHA2DS2-VASc Score =     This indicates a  % annual risk of stroke. The patient's score is based upon:              Physical Exam:   VS:  BP 126/60 (BP Location: Left Arm, Patient Position: Sitting, Cuff Size: Large)   Pulse (!) 59   Ht 5\' 6"  (1.676 m)   Wt 198 lb 3.2 oz (89.9 kg)   SpO2 96%   BMI 31.99 kg/m    Wt Readings from Last 3 Encounters:  01/04/23 198 lb 3.2 oz (89.9 kg)  12/20/22 191 lb 12.8 oz (87 kg)  11/29/22 205 lb 6.4 oz (93.2 kg)     GEN: Well nourished, well developed in no acute distress NECK: No JVD; No carotid bruits CARDIAC: Regular rate and rhythm, no murmurs, rubs, gallops RESPIRATORY:  Clear to auscultation without rales, wheezing or rhonchi  ABDOMEN: Soft, non-tender, non-distended EXTREMITIES:  No edema; No deformity   ASSESSMENT AND PLAN:    Chronic Systolic Dysfunction s/p Medtronic CRT-D LBBB  -euvolemic today -Stable on an appropriate medical regimen / GDMT per Advanced HF Team -Normal ICD function -See Pace Art report -No changes today -99.3% BiV pacing  -device at ERI as of 12/03/22, procedure reviewed for generator change with patient, discussed med holds before procedure > specifically eliquis and  jardiance  -pre-procedure labs > CMP (for amio & procedure), CBC  Paroxysmal AF AFL -OAC for stroke prophylaxis  -amiodarone 200mg  BID > consider reduction to 200 mg daily if no further AF/AFL at device check visit in 3 months  Secondary Hypercoagulable State  -continue Eliquis 5 mg BID, dose reviewed and appropriate by age/wt   Hx PE/DVT  S/p IVC Filter, historically poor compliance with eliquis  -recent V/Q scan with low likelihood of PE   Disposition:   Follow up with Dr.  Elberta Fortis on 01/27/23 for generator change.    Signed, Canary Brim, MSN, APRN, NP-C, AGACNP-BC [facility] - Electrophysiology  01/04/2023, 2:19 PM

## 2023-01-04 ENCOUNTER — Ambulatory Visit: Payer: Medicare Other | Attending: Pulmonary Disease | Admitting: Pulmonary Disease

## 2023-01-04 ENCOUNTER — Encounter: Payer: Self-pay | Admitting: Pulmonary Disease

## 2023-01-04 VITALS — BP 126/60 | HR 59 | Ht 66.0 in | Wt 198.2 lb

## 2023-01-04 DIAGNOSIS — I472 Ventricular tachycardia, unspecified: Secondary | ICD-10-CM | POA: Diagnosis not present

## 2023-01-04 DIAGNOSIS — D6869 Other thrombophilia: Secondary | ICD-10-CM | POA: Diagnosis not present

## 2023-01-04 DIAGNOSIS — I480 Paroxysmal atrial fibrillation: Secondary | ICD-10-CM | POA: Diagnosis not present

## 2023-01-04 DIAGNOSIS — I5022 Chronic systolic (congestive) heart failure: Secondary | ICD-10-CM

## 2023-01-04 LAB — CUP PACEART REMOTE DEVICE CHECK
Battery Remaining Longevity: 1 mo — CL
Battery Voltage: 2.66 V
Brady Statistic AP VP Percent: 3.86 %
Brady Statistic AP VS Percent: 0.01 %
Brady Statistic AS VP Percent: 96 %
Brady Statistic AS VS Percent: 0.13 %
Brady Statistic RA Percent Paced: 3.87 %
Brady Statistic RV Percent Paced: 99.5 %
Date Time Interrogation Session: 20241230012302
HighPow Impedance: 46 Ohm
HighPow Impedance: 59 Ohm
Implantable Lead Connection Status: 753985
Implantable Lead Connection Status: 753985
Implantable Lead Connection Status: 753985
Implantable Lead Implant Date: 20090720
Implantable Lead Implant Date: 20090720
Implantable Lead Implant Date: 20170627
Implantable Lead Location: 753858
Implantable Lead Location: 753859
Implantable Lead Location: 753860
Implantable Lead Model: 4598
Implantable Lead Model: 5076
Implantable Lead Model: 6947
Implantable Pulse Generator Implant Date: 20170627
Lead Channel Impedance Value: 1026 Ohm
Lead Channel Impedance Value: 1159 Ohm
Lead Channel Impedance Value: 1159 Ohm
Lead Channel Impedance Value: 342 Ohm
Lead Channel Impedance Value: 399 Ohm
Lead Channel Impedance Value: 456 Ohm
Lead Channel Impedance Value: 494 Ohm
Lead Channel Impedance Value: 494 Ohm
Lead Channel Impedance Value: 513 Ohm
Lead Channel Impedance Value: 646 Ohm
Lead Channel Impedance Value: 722 Ohm
Lead Channel Impedance Value: 722 Ohm
Lead Channel Impedance Value: 722 Ohm
Lead Channel Pacing Threshold Amplitude: 0.375 V
Lead Channel Pacing Threshold Amplitude: 0.625 V
Lead Channel Pacing Threshold Amplitude: 1.125 V
Lead Channel Pacing Threshold Pulse Width: 0.4 ms
Lead Channel Pacing Threshold Pulse Width: 0.4 ms
Lead Channel Pacing Threshold Pulse Width: 0.6 ms
Lead Channel Sensing Intrinsic Amplitude: 5.25 mV
Lead Channel Sensing Intrinsic Amplitude: 5.25 mV
Lead Channel Sensing Intrinsic Amplitude: 9.25 mV
Lead Channel Sensing Intrinsic Amplitude: 9.25 mV
Lead Channel Setting Pacing Amplitude: 1.5 V
Lead Channel Setting Pacing Amplitude: 2 V
Lead Channel Setting Pacing Amplitude: 2 V
Lead Channel Setting Pacing Pulse Width: 0.4 ms
Lead Channel Setting Pacing Pulse Width: 0.6 ms
Lead Channel Setting Sensing Sensitivity: 0.3 mV
Zone Setting Status: 755011
Zone Setting Status: 755011

## 2023-01-04 LAB — CUP PACEART INCLINIC DEVICE CHECK
Battery Remaining Longevity: 1 mo — CL
Battery Voltage: 2.67 V
Brady Statistic AP VP Percent: 3.18 %
Brady Statistic AP VS Percent: 0.01 %
Brady Statistic AS VP Percent: 96.61 %
Brady Statistic AS VS Percent: 0.2 %
Brady Statistic RA Percent Paced: 3.19 %
Brady Statistic RV Percent Paced: 99.3 %
Date Time Interrogation Session: 20241231142855
HighPow Impedance: 48 Ohm
HighPow Impedance: 63 Ohm
Implantable Lead Connection Status: 753985
Implantable Lead Connection Status: 753985
Implantable Lead Connection Status: 753985
Implantable Lead Implant Date: 20090720
Implantable Lead Implant Date: 20090720
Implantable Lead Implant Date: 20170627
Implantable Lead Location: 753858
Implantable Lead Location: 753859
Implantable Lead Location: 753860
Implantable Lead Model: 4598
Implantable Lead Model: 5076
Implantable Lead Model: 6947
Implantable Pulse Generator Implant Date: 20170627
Lead Channel Impedance Value: 1026 Ohm
Lead Channel Impedance Value: 1140 Ohm
Lead Channel Impedance Value: 1178 Ohm
Lead Channel Impedance Value: 323 Ohm
Lead Channel Impedance Value: 342 Ohm
Lead Channel Impedance Value: 437 Ohm
Lead Channel Impedance Value: 494 Ohm
Lead Channel Impedance Value: 513 Ohm
Lead Channel Impedance Value: 513 Ohm
Lead Channel Impedance Value: 646 Ohm
Lead Channel Impedance Value: 703 Ohm
Lead Channel Impedance Value: 722 Ohm
Lead Channel Impedance Value: 722 Ohm
Lead Channel Pacing Threshold Amplitude: 0.5 V
Lead Channel Pacing Threshold Amplitude: 0.625 V
Lead Channel Pacing Threshold Amplitude: 1 V
Lead Channel Pacing Threshold Pulse Width: 0.4 ms
Lead Channel Pacing Threshold Pulse Width: 0.4 ms
Lead Channel Pacing Threshold Pulse Width: 0.6 ms
Lead Channel Sensing Intrinsic Amplitude: 5.125 mV
Lead Channel Sensing Intrinsic Amplitude: 5.375 mV
Lead Channel Sensing Intrinsic Amplitude: 7.375 mV
Lead Channel Sensing Intrinsic Amplitude: 8.25 mV
Lead Channel Setting Pacing Amplitude: 1.5 V
Lead Channel Setting Pacing Amplitude: 2 V
Lead Channel Setting Pacing Amplitude: 2 V
Lead Channel Setting Pacing Pulse Width: 0.4 ms
Lead Channel Setting Pacing Pulse Width: 0.6 ms
Lead Channel Setting Sensing Sensitivity: 0.3 mV
Zone Setting Status: 755011
Zone Setting Status: 755011

## 2023-01-04 NOTE — Patient Instructions (Signed)
 Medication Instructions:  Your physician recommends that you continue on your current medications as directed. Please refer to the Current Medication list given to you today.  *If you need a refill on your cardiac medications before your next appointment, please call your pharmacy*  Lab Work: Your physician recommends that you have lab work today- CMET and CBC  If you have labs (blood work) drawn today and your tests are completely normal, you will receive your results only by: MyChart Message (if you have MyChart) OR A paper copy in the mail If you have any lab test that is abnormal or we need to change your treatment, we will call you to review the results.  Follow-Up: At [facility], you and your health needs are our priority.  As part of our continuing mission to provide you with exceptional heart care, we have created designated Provider Care Teams.  These Care Teams include your primary Cardiologist (physician) and Advanced Practice Providers (APPs -  Physician Assistants and Nurse Practitioners) who all work together to provide you with the care you need, when you need it.  We recommend signing up for the patient portal called MyChart.  Sign up information is provided on this After Visit Summary.  MyChart is used to connect with patients for Virtual Visits (Telemedicine).  Patients are able to view lab/test results, encounter notes, upcoming appointments, etc.  Non-urgent messages can be sent to your provider as well.   To learn more about what you can do with MyChart, go to [email or web address].    Your next appointment:   We will contact you for follow-up appointments  Provider:   You may see Will Gladis Norton, MD or one of the following Advanced Practice Providers on your designated Care Team:   Charlies Arthur, PA-C Michael Andy Tillery, PA-C Suzann Riddle, NP Daphne Barrack, NP

## 2023-01-06 ENCOUNTER — Ambulatory Visit (INDEPENDENT_AMBULATORY_CARE_PROVIDER_SITE_OTHER): Payer: Medicare Other

## 2023-01-06 DIAGNOSIS — I472 Ventricular tachycardia, unspecified: Secondary | ICD-10-CM | POA: Diagnosis not present

## 2023-01-08 LAB — COMPREHENSIVE METABOLIC PANEL
ALT: 13 [IU]/L (ref 0–32)
AST: 21 [IU]/L (ref 0–40)
Albumin: 4 g/dL (ref 3.8–4.8)
Alkaline Phosphatase: 95 [IU]/L (ref 44–121)
BUN/Creatinine Ratio: 17 (ref 12–28)
BUN: 24 mg/dL (ref 8–27)
Bilirubin Total: 0.3 mg/dL (ref 0.0–1.2)
CO2: 23 mmol/L (ref 20–29)
Calcium: 10.8 mg/dL — ABNORMAL HIGH (ref 8.7–10.3)
Chloride: 102 mmol/L (ref 96–106)
Creatinine, Ser: 1.42 mg/dL — ABNORMAL HIGH (ref 0.57–1.00)
Globulin, Total: 3.5 g/dL (ref 1.5–4.5)
Glucose: 126 mg/dL — ABNORMAL HIGH (ref 70–99)
Potassium: 4.5 mmol/L (ref 3.5–5.2)
Sodium: 139 mmol/L (ref 134–144)
Total Protein: 7.5 g/dL (ref 6.0–8.5)
eGFR: 39 mL/min/{1.73_m2} — ABNORMAL LOW (ref >59)

## 2023-01-08 LAB — CBC WITH DIFFERENTIAL/PLATELET
Basophils Absolute: 0.1 10*3/uL (ref 0.0–0.2)
Basos: 1 %
EOS (ABSOLUTE): 0.1 10*3/uL (ref 0.0–0.4)
Eos: 3 %
Hematocrit: 42.4 % (ref 34.0–46.6)
Hemoglobin: 13.2 g/dL (ref 11.1–15.9)
Immature Grans (Abs): 0 10*3/uL (ref 0.0–0.1)
Immature Granulocytes: 0 %
Lymphocytes Absolute: 1.6 10*3/uL (ref 0.7–3.1)
Lymphs: 39 %
MCH: 27.7 pg (ref 26.6–33.0)
MCHC: 31.1 g/dL — ABNORMAL LOW (ref 31.5–35.7)
MCV: 89 fL (ref 79–97)
Monocytes Absolute: 0.5 10*3/uL (ref 0.1–0.9)
Monocytes: 12 %
Neutrophils Absolute: 1.8 10*3/uL (ref 1.4–7.0)
Neutrophils: 45 %
Platelets: 213 10*3/uL (ref 150–450)
RBC: 4.76 x10E6/uL (ref 3.77–5.28)
RDW: 14.4 % (ref 11.7–15.4)
WBC: 4.1 10*3/uL (ref 3.4–10.8)

## 2023-01-10 ENCOUNTER — Encounter (HOSPITAL_COMMUNITY): Payer: Medicare Other

## 2023-01-17 NOTE — Progress Notes (Signed)
ADVANCED HF CLINIC NOTE  Referring Physician: Rometta Emery, MD Primary Care: Rometta Emery, MD Primary Cardiologist: Charlton Haws, MD AHF Cardiologist: Arvilla Meres  Chief Complaint: F/u chronic systolic HF  HPI: Shelley Douglas is a 73 y.o. female w/ h/o chronic systolic heart failure dating back to 2009 due to ICM, CAD w/ remote inferior infarct w/ stenting to the RCA. Initial MI in 2009 w/ BMS x 2 to prox and mid RCA.  Most recent LHC in 2016 showed 70% ISR of pRCA stent, 80% ISR of mRCA stent, CTO of distal RCA and 30% stenosis of pLAD. Disease was treated medically given no viability on nuclear stress test. Repeat nuclear study in 2018 showed inferior infarct but no ischemia.    S/p ICD w/ upgrade to CRT-D in 2017 for LBBB. Also w/ h/o MR s/p remote MV annuloplasty in 1996, atrial flutter/fibrillation, DVT/PE s/p IVC filter, on Eliquis, CVA, HTN, HLD, Type 2DM, CKD IIIa and b/l carpal tunnel s/p b/l CTR.   Echo 11/2021 EF 30-35%, global HK, mild LVH, mod BAE, + stable MV annuloplasty w/ mild-mod MS mean gradient 4 mmHg, RV mildly reduced.   Admitted 12/9-12/16/24 for CHF exacerbation and afib RVR (device showed x4 days) requiring brief milrinone and TEE/DC-CV. Significant improvement with diuresis. Echo showed EF 20-25% with severely reduced RV and mod/sev TR.   Today she returns for post hospital follow up. Overall feeling good. Denies palpitations, CP, dizziness, or PND/Orthopnea. Chronic BLE edema. Denies SOB. Appetite very good, husband cooks for her. No fever or chills. Does not weight at home. Taking all medications.   Cardiac Studies: - Echo (11/23): EF 30-35%, global HK, mild LVH, RV mildly reduced - Echo (12/24): EF 20-25% RV  severely reduced, mod mitral stenosis.  - TEE (12/24): EF 30-35% RV mildly reduced mild MR/MS. Moderate TR  Past Medical History:  Diagnosis Date   AICD (automatic cardioverter/defibrillator) present    Anxiety    pt denies this hx  on 05/12/2016   CAD (coronary artery disease)    last cath 2016   Daily headache    DVT (deep venous thrombosis) (HCC) 1996   High cholesterol    History of blood transfusion    "related to pregnancy" (05/12/2016)   Hypertension    "just started taking RX" (05/12/2016)   Ischemic cardiomyopathy    Left bundle branch block    Mitral valve regurgitation    Myocardial infarction [facility]) ~ 2009 X2   Obesity    Persistent atrial fibrillation (HCC)    Pulmonary embolism (HCC) 1993   Seizures (HCC) 1970s   "I had 1; don't know what it was from" (05/12/2016)   Stroke [facility]) 2001   denies residual on 05/12/2016   Type II diabetes mellitus (HCC)     Current Outpatient Medications  Medication Sig Dispense Refill   amiodarone (PACERONE) 200 MG tablet Take 1 tablet (200 mg total) by mouth 2 (two) times daily. 60 tablet 0   apixaban (ELIQUIS) 5 MG TABS tablet Take 1 tablet (5 mg total) by mouth 2 (two) times daily. 28 tablet 0   atorvastatin (LIPITOR) 40 MG tablet Take 1 tablet (40 mg total) by mouth daily. 30 tablet 0   digoxin (LANOXIN) 0.125 MG tablet Take 1 tablet (0.125 mg total) by mouth daily. 30 tablet 0   empagliflozin (JARDIANCE) 10 MG TABS tablet Take 1 tablet (10 mg total) by mouth daily. 30 tablet 0   furosemide (LASIX) 40 MG tablet Take 1  tablet (40 mg total) by mouth daily. 30 tablet 0   metoprolol succinate (TOPROL-XL) 25 MG 24 hr tablet Take 0.5 tablets (12.5 mg total) by mouth daily. 15 tablet 0   sacubitril-valsartan (ENTRESTO) 24-26 MG Take 1 tablet by mouth 2 (two) times daily. 60 tablet 0   spironolactone (ALDACTONE) 25 MG tablet Take 0.5 tablets (12.5 mg total) by mouth daily. 15 tablet 0   No current facility-administered medications for this encounter.    Allergies  Allergen Reactions   Plavix [Clopidogrel Bisulfate] Rash    Social History   Socioeconomic History   Marital status: Married    Spouse name: Not on file   Number of children: Not on file   Years of  education: Not on file   Highest education level: Not on file  Occupational History   Occupation: disabled  Tobacco Use   Smoking status: Former    Current packs/day: 0.00    Types: Cigarettes    Start date: 01/05/1984    Quit date: 01/05/1988    Years since quitting: 35.0   Smokeless tobacco: Never   Tobacco comments:    05/12/2016 "someday smoker when I did smoked; never smoked much"  Vaping Use   Vaping status: Never Used  Substance and Sexual Activity   Alcohol use: No   Drug use: No   Sexual activity: Not Currently  Other Topics Concern   Not on file  Social History Narrative   Pt lives in [place] with spouse.   Disabled   Social Drivers of Corporate investment banker Strain: Not on file  Food Insecurity: No Food Insecurity (12/14/2022)   Hunger Vital Sign    Worried About Running Out of Food in the Last Year: Never true    Ran Out of Food in the Last Year: Never true  Transportation Needs: No Transportation Needs (12/14/2022)   PRAPARE - Administrator, Civil Service (Medical): No    Lack of Transportation (Non-Medical): No  Physical Activity: Not on file  Stress: Not on file  Social Connections: Not on file  Intimate Partner Violence: Not At Risk (12/14/2022)   Humiliation, Afraid, Rape, and Kick questionnaire    Fear of Current or Ex-Partner: No    Emotionally Abused: No    Physically Abused: No    Sexually Abused: No      Family History  Problem Relation Age of Onset   Hypertension Unknown     Vitals:   01/25/23 1208  BP: (!) 144/82  Pulse: 83  SpO2: 94%  Weight: 88.3 kg (194 lb 9.6 oz)    PHYSICAL EXAM: General:  well appearing.  No respiratory difficulty. Walked into clinic HEENT: normal Neck: supple. JVD ~6 cm. Carotids 2+ bilat; no bruits. No lymphadenopathy or thyromegaly appreciated. Cor: PMI nondisplaced. Regular rate & rhythm. No rubs, gallops or murmurs. Lungs: clear Abdomen: soft, nontender, nondistended. No  hepatosplenomegaly. No bruits or masses. Good bowel sounds. Extremities: no cyanosis, clubbing, rash, trace ankle edema  Neuro: alert & oriented x 3, cranial nerves grossly intact. moves all 4 extremities w/o difficulty. Affect pleasant.   Wt Readings from Last 3 Encounters:  01/25/23 88.3 kg (194 lb 9.6 oz)  01/04/23 89.9 kg (198 lb 3.2 oz)  12/20/22 87 kg (191 lb 12.8 oz)    ECG: a sensed v paced 72 bpm (Personally reviewed)     ASSESSMENT & PLAN: 1. Acute on Chronic Bventricular Heart Failure - Long standing since 2009. Ischemic CM. Also w/  LBBB s/p CRT-D  -- w/ h/o b/l carpal tunnel, consider w/u for TTR amyloid w/ PYP scan. Send amyloid labs today, if + will need PYP - Echo (11/23): EF 30-35%, global HK, mild LVH, RV mildly reduced - Echo (12/24): EF 20-25% RV  severely reduced, mod mitral stenosis.  - TEE (12/24): EF 30-35% RV mildly reduced mild MR/MS. Moderate TR - Continue Digoxin 0.125 mg daily. Check level today as she has not had meds this morning.  - Continue Jardiance 10 mg daily - Continue Lasix 40 mg daily  - Continue Toprol XL 12.5 mg daily  - Continue Entresto 24-26 mg bid - Increase Spirolactone 12.5>25 mg daily. BMET/BNP today, repeat BMET in 7-10 days  2. AFL w/ RVR, persistent - Followed by EP  - s/p DC-CV on 12/13. Remains in SR - Continue amio to 200 po bid  - Continue Eliquis 5 mg bid, denies abnormal bleeding. CBC today.  - Recommend out patient sleep study  - Plan for generator change out with Dr. Elberta Fortis 01/26/22   3. CKD IIIa - Baseline Cr ~1.1-1.5 - SCr 1.42 01/07/23   4. H/o PE/DVT - s/p IVC filter. Historically poor compliance with Eliquis. - Continue Eliquis    5. CAD - remote inferior infarct, BMS prox and mid RCA  - known CTO/ISR of RCA by cath 2016, treated medically  - No s/s angina - LDL (12/24) 146 - Continue ASA + statin - Increase atorvastatin 40 mg >80 mg daily   6. Mitral Valve Disorder - s/p MV annuloplasty in 1996 - Echo  11/23 w/ mild MS, mG 4 mmHg - TEE 12/24 mild MS   7. Suspected OSA - h/o snoring  - refer for out patient sleep study   Follow up 3 months with Dr. Glenice Laine AGACNP-BC  [facility] [address] [facility] [place] [address] [phone] (office) [phone] (fax)

## 2023-01-24 ENCOUNTER — Other Ambulatory Visit (HOSPITAL_COMMUNITY): Payer: Self-pay

## 2023-01-25 ENCOUNTER — Encounter (HOSPITAL_COMMUNITY): Payer: Self-pay

## 2023-01-25 ENCOUNTER — Ambulatory Visit (HOSPITAL_COMMUNITY)
Admission: RE | Admit: 2023-01-25 | Discharge: 2023-01-25 | Disposition: A | Discharge status: Home / Self Care | Payer: Medicare Other | Source: Ambulatory Visit | Attending: Internal Medicine | Admitting: Internal Medicine

## 2023-01-25 VITALS — BP 144/82 | HR 83 | Wt 194.6 lb

## 2023-01-25 DIAGNOSIS — E1122 Type 2 diabetes mellitus with diabetic chronic kidney disease: Secondary | ICD-10-CM | POA: Diagnosis not present

## 2023-01-25 DIAGNOSIS — I059 Rheumatic mitral valve disease, unspecified: Secondary | ICD-10-CM

## 2023-01-25 DIAGNOSIS — I5022 Chronic systolic (congestive) heart failure: Secondary | ICD-10-CM | POA: Diagnosis not present

## 2023-01-25 DIAGNOSIS — Z95810 Presence of automatic (implantable) cardiac defibrillator: Secondary | ICD-10-CM | POA: Insufficient documentation

## 2023-01-25 DIAGNOSIS — I4819 Other persistent atrial fibrillation: Secondary | ICD-10-CM | POA: Insufficient documentation

## 2023-01-25 DIAGNOSIS — I2510 Atherosclerotic heart disease of native coronary artery without angina pectoris: Secondary | ICD-10-CM | POA: Diagnosis not present

## 2023-01-25 DIAGNOSIS — Z7901 Long term (current) use of anticoagulants: Secondary | ICD-10-CM | POA: Diagnosis not present

## 2023-01-25 DIAGNOSIS — G4733 Obstructive sleep apnea (adult) (pediatric): Secondary | ICD-10-CM

## 2023-01-25 DIAGNOSIS — Z7984 Long term (current) use of oral hypoglycemic drugs: Secondary | ICD-10-CM | POA: Insufficient documentation

## 2023-01-25 DIAGNOSIS — Z79899 Other long term (current) drug therapy: Secondary | ICD-10-CM | POA: Diagnosis not present

## 2023-01-25 DIAGNOSIS — I130 Hypertensive heart and chronic kidney disease with heart failure and stage 1 through stage 4 chronic kidney disease, or unspecified chronic kidney disease: Secondary | ICD-10-CM | POA: Insufficient documentation

## 2023-01-25 DIAGNOSIS — Z955 Presence of coronary angioplasty implant and graft: Secondary | ICD-10-CM | POA: Insufficient documentation

## 2023-01-25 DIAGNOSIS — E782 Mixed hyperlipidemia: Secondary | ICD-10-CM

## 2023-01-25 DIAGNOSIS — I480 Paroxysmal atrial fibrillation: Secondary | ICD-10-CM | POA: Diagnosis not present

## 2023-01-25 DIAGNOSIS — I255 Ischemic cardiomyopathy: Secondary | ICD-10-CM | POA: Insufficient documentation

## 2023-01-25 DIAGNOSIS — N1831 Chronic kidney disease, stage 3a: Secondary | ICD-10-CM | POA: Insufficient documentation

## 2023-01-25 DIAGNOSIS — Z91148 Patient's other noncompliance with medication regimen for other reason: Secondary | ICD-10-CM | POA: Insufficient documentation

## 2023-01-25 DIAGNOSIS — Z86711 Personal history of pulmonary embolism: Secondary | ICD-10-CM

## 2023-01-25 LAB — CBC
HCT: 42.6 % (ref 36.0–46.0)
Hemoglobin: 13.6 g/dL (ref 12.0–15.0)
MCH: 28.2 pg (ref 26.0–34.0)
MCHC: 31.9 g/dL (ref 30.0–36.0)
MCV: 88.2 fL (ref 80.0–100.0)
Platelets: 216 10*3/uL (ref 150–400)
RBC: 4.83 MIL/uL (ref 3.87–5.11)
RDW: 14.9 % (ref 11.5–15.5)
WBC: 4.6 10*3/uL (ref 4.0–10.5)
nRBC: 0 % (ref 0.0–0.2)

## 2023-01-25 LAB — BASIC METABOLIC PANEL
Anion gap: 9 (ref 5–15)
BUN: 13 mg/dL (ref 8–23)
CO2: 27 mmol/L (ref 22–32)
Calcium: 10.9 mg/dL — ABNORMAL HIGH (ref 8.9–10.3)
Chloride: 103 mmol/L (ref 98–111)
Creatinine, Ser: 1.31 mg/dL — ABNORMAL HIGH (ref 0.44–1.00)
GFR, Estimated: 43 mL/min — ABNORMAL LOW (ref >60)
Glucose, Bld: 119 mg/dL — ABNORMAL HIGH (ref 70–99)
Potassium: 4.2 mmol/L (ref 3.5–5.1)
Sodium: 139 mmol/L (ref 135–145)

## 2023-01-25 LAB — DIGOXIN LEVEL: Digoxin Level: 0.7 ng/mL — ABNORMAL LOW (ref 0.8–2.0)

## 2023-01-25 LAB — BRAIN NATRIURETIC PEPTIDE: B Natriuretic Peptide: 130.7 pg/mL — ABNORMAL HIGH (ref 0.0–100.0)

## 2023-01-25 MED ORDER — ATORVASTATIN CALCIUM 80 MG PO TABS: 80.0000 mg | ORAL_TABLET | Freq: Every day | ORAL | 3 refills | Status: DC

## 2023-01-25 MED ORDER — SPIRONOLACTONE 25 MG PO TABS: 25.0000 mg | ORAL_TABLET | Freq: Every day | ORAL | 6 refills | Status: DC

## 2023-01-25 NOTE — Patient Instructions (Signed)
Thank you for coming in today  If you had labs drawn today, any labs that are abnormal the clinic will call you No news is good news  You were given a scale to weigh yourself daily  Medications: Increase Atorvastatin to 80 mg 1 tablet daily Increase Spironolactone to 25 mg 1 tablet daily  Follow up appointments: Your physician recommends that you return for lab work in: 7-10 days for BMET  Your physician recommends that you schedule a follow-up appointment in:  3 months With Dr. Earlean Shawl will receive a reminder letter in the mail a few months in advance. If you don't receive a letter, please call our office to schedule the follow-up appointment.    Do the following things EVERYDAY: Weigh yourself in the morning before breakfast. Write it down and keep it in a log. Take your medicines as prescribed Eat low salt foods--Limit salt (sodium) to 2000 mg per day.  Stay as active as you can everyday Limit all fluids for the day to less than 2 liters   At the [facility], you and your health needs are our priority. As part of our continuing mission to provide you with exceptional heart care, we have created designated Provider Care Teams. These Care Teams include your primary Cardiologist (physician) and Advanced Practice Providers (APPs- Physician Assistants and Nurse Practitioners) who all work together to provide you with the care you need, when you need it.   You may see any of the following providers on your designated Care Team at your next follow up: Dr Arvilla Meres Dr Marca Ancona Dr. Marcos Eke, NP Robbie Lis, [place] [facility] [place], [place] Brynda Peon, NP Karle Plumber, PharmD   Please be sure to bring in all your medications bottles to every appointment.    Thank you for choosing [facility]  If you have any questions or concerns before your next appointment please send [place] a  message through [place] or call our office at [phone].    TO LEAVE A MESSAGE FOR THE NURSE SELECT OPTION 2, PLEASE LEAVE A MESSAGE INCLUDING: YOUR NAME DATE OF BIRTH CALL BACK NUMBER REASON FOR CALL**this is important as we prioritize the call backs  YOU WILL RECEIVE A CALL BACK THE SAME DAY AS LONG AS YOU CALL BEFORE 4:00 PM

## 2023-01-25 NOTE — Progress Notes (Signed)
ReDS Vest / Clip - 01/25/23 1200       ReDS Vest / Clip   Station Marker C    ReDS Value Range Moderate volume overload    ReDS Actual Value 39

## 2023-01-26 ENCOUNTER — Encounter (HOSPITAL_COMMUNITY): Payer: Self-pay

## 2023-01-26 ENCOUNTER — Other Ambulatory Visit: Payer: Self-pay

## 2023-01-26 LAB — KAPPA/LAMBDA LIGHT CHAINS
Kappa free light chain: 119.9 mg/L — ABNORMAL HIGH (ref 3.3–19.4)
Kappa, lambda light chain ratio: 2.37 — ABNORMAL HIGH (ref 0.26–1.65)
Lambda free light chains: 50.6 mg/L — ABNORMAL HIGH (ref 5.7–26.3)

## 2023-01-26 NOTE — Pre-Procedure Instructions (Signed)
Attempted to call patient regarding procedure instructions.  Left voicemail on the following items: Arrival time 0730 Nothing to eat or drink after midnight No meds AM of procedure Responsible person to drive you home and stay with you for 24 hrs Wash with special soap night before and morning of procedure If on anti-coagulant drug instructions Eliquis- last dose 1/20

## 2023-01-26 NOTE — Addendum Note (Signed)
Encounter addended by: Alen Bleacher, NP on: 01/26/2023 4:00 PM  Actions taken: Clinical Note Signed

## 2023-01-27 ENCOUNTER — Ambulatory Visit (HOSPITAL_COMMUNITY)
Admission: RE | Admit: 2023-01-27 | Discharge: 2023-01-27 | Disposition: A | Discharge status: Home / Self Care | Payer: Medicare Other | Attending: Cardiology | Admitting: Cardiology

## 2023-01-27 ENCOUNTER — Encounter (HOSPITAL_COMMUNITY): Payer: Self-pay | Admitting: Cardiology

## 2023-01-27 ENCOUNTER — Encounter (HOSPITAL_COMMUNITY)
Admission: RE | Disposition: A | Discharge status: Home / Self Care | Payer: Self-pay | Source: Home / Self Care | Attending: Cardiology

## 2023-01-27 ENCOUNTER — Other Ambulatory Visit: Payer: Self-pay

## 2023-01-27 ENCOUNTER — Telehealth: Payer: Self-pay | Admitting: Cardiology

## 2023-01-27 DIAGNOSIS — I480 Paroxysmal atrial fibrillation: Secondary | ICD-10-CM | POA: Diagnosis not present

## 2023-01-27 DIAGNOSIS — Z86718 Personal history of other venous thrombosis and embolism: Secondary | ICD-10-CM | POA: Insufficient documentation

## 2023-01-27 DIAGNOSIS — Z4502 Encounter for adjustment and management of automatic implantable cardiac defibrillator: Secondary | ICD-10-CM | POA: Diagnosis present

## 2023-01-27 DIAGNOSIS — D6869 Other thrombophilia: Secondary | ICD-10-CM | POA: Insufficient documentation

## 2023-01-27 DIAGNOSIS — E119 Type 2 diabetes mellitus without complications: Secondary | ICD-10-CM | POA: Insufficient documentation

## 2023-01-27 DIAGNOSIS — I447 Left bundle-branch block, unspecified: Secondary | ICD-10-CM | POA: Insufficient documentation

## 2023-01-27 DIAGNOSIS — Z7901 Long term (current) use of anticoagulants: Secondary | ICD-10-CM | POA: Insufficient documentation

## 2023-01-27 DIAGNOSIS — I2510 Atherosclerotic heart disease of native coronary artery without angina pectoris: Secondary | ICD-10-CM | POA: Diagnosis not present

## 2023-01-27 DIAGNOSIS — Z8673 Personal history of transient ischemic attack (TIA), and cerebral infarction without residual deficits: Secondary | ICD-10-CM | POA: Diagnosis not present

## 2023-01-27 DIAGNOSIS — I110 Hypertensive heart disease with heart failure: Secondary | ICD-10-CM | POA: Diagnosis not present

## 2023-01-27 DIAGNOSIS — I255 Ischemic cardiomyopathy: Secondary | ICD-10-CM | POA: Diagnosis not present

## 2023-01-27 DIAGNOSIS — I5022 Chronic systolic (congestive) heart failure: Secondary | ICD-10-CM | POA: Insufficient documentation

## 2023-01-27 HISTORY — PX: ICD GENERATOR CHANGEOUT: EP1231

## 2023-01-27 LAB — GLUCOSE, CAPILLARY: Glucose-Capillary: 114 mg/dL — ABNORMAL HIGH (ref 70–99)

## 2023-01-27 SURGERY — ICD GENERATOR CHANGEOUT
Anesthesia: LOCAL

## 2023-01-27 MED ORDER — SODIUM CHLORIDE 0.9% FLUSH: 3.0000 mL | Freq: Two times a day (BID) | INTRAVENOUS | Status: DC

## 2023-01-27 MED ORDER — CHLORHEXIDINE GLUCONATE 4 % EX SOLN
4.0000 | Freq: Once | CUTANEOUS | Status: DC
  Filled 2023-01-27: qty 60

## 2023-01-27 MED ORDER — SODIUM CHLORIDE 0.9 % IV SOLN: 250.0000 mL | INTRAVENOUS | Status: DC

## 2023-01-27 MED ORDER — SODIUM CHLORIDE 0.9 % IV SOLN
80.0000 mg | INTRAVENOUS | Status: AC
  Administered 2023-01-27: 80 mg

## 2023-01-27 MED ORDER — LIDOCAINE HCL (PF) 1 % IJ SOLN
INTRAMUSCULAR | Status: AC
  Filled 2023-01-27: qty 60

## 2023-01-27 MED ORDER — FENTANYL CITRATE (PF) 100 MCG/2ML IJ SOLN
INTRAMUSCULAR | Status: AC
  Filled 2023-01-27: qty 2

## 2023-01-27 MED ORDER — CEFAZOLIN SODIUM-DEXTROSE 2-4 GM/100ML-% IV SOLN
2.0000 g | INTRAVENOUS | Status: AC
  Administered 2023-01-27: 2 g via INTRAVENOUS

## 2023-01-27 MED ORDER — ACETAMINOPHEN 325 MG PO TABS: 325.0000 mg | ORAL_TABLET | ORAL | Status: DC | PRN

## 2023-01-27 MED ORDER — POVIDONE-IODINE 10 % EX SWAB
2.0000 | Freq: Once | CUTANEOUS | Status: AC
  Administered 2023-01-27: 2 via TOPICAL

## 2023-01-27 MED ORDER — MIDAZOLAM HCL 5 MG/5ML IJ SOLN
INTRAMUSCULAR | Status: DC | PRN
  Administered 2023-01-27: 1 mg via INTRAVENOUS

## 2023-01-27 MED ORDER — FENTANYL CITRATE (PF) 100 MCG/2ML IJ SOLN
INTRAMUSCULAR | Status: DC | PRN
  Administered 2023-01-27: 25 ug via INTRAVENOUS

## 2023-01-27 MED ORDER — SODIUM CHLORIDE 0.9 % IV SOLN
INTRAVENOUS | Status: AC
  Filled 2023-01-27: qty 2

## 2023-01-27 MED ORDER — SODIUM CHLORIDE 0.9% FLUSH: 3.0000 mL | INTRAVENOUS | Status: DC | PRN

## 2023-01-27 MED ORDER — ONDANSETRON HCL 4 MG/2ML IJ SOLN: 4.0000 mg | Freq: Four times a day (QID) | INTRAMUSCULAR | Status: DC | PRN

## 2023-01-27 MED ORDER — MIDAZOLAM HCL 2 MG/2ML IJ SOLN
INTRAMUSCULAR | Status: AC
  Filled 2023-01-27: qty 2

## 2023-01-27 MED ORDER — CEFAZOLIN SODIUM-DEXTROSE 2-4 GM/100ML-% IV SOLN
INTRAVENOUS | Status: AC
  Filled 2023-01-27: qty 100

## 2023-01-27 MED ORDER — LIDOCAINE HCL (PF) 1 % IJ SOLN
INTRAMUSCULAR | Status: DC | PRN
  Administered 2023-01-27: 60 mL

## 2023-01-27 SURGICAL SUPPLY — 6 items
CABLE SURGICAL S-101-97-12 (CABLE) ×1 IMPLANT
ICD COBALT XT QUAD CRT DTPA2Q1 (ICD Generator) IMPLANT
PAD DEFIB RADIO PHYSIO CONN (PAD) ×1 IMPLANT
POUCH AIGIS-R ANTIBACT ICD (Mesh General) ×1 IMPLANT
POUCH AIGIS-R ANTIBACT ICD LRG (Mesh General) IMPLANT
TRAY PACEMAKER INSERTION (PACKS) ×1 IMPLANT

## 2023-01-27 NOTE — Discharge Instructions (Signed)

## 2023-01-27 NOTE — Progress Notes (Signed)
Called client at home and spoke with her and her husband and notified them to resume eliquis 1/26 with pm dose and client and her husband voiced understanding

## 2023-01-27 NOTE — Interval H&P Note (Signed)
History and Physical Interval Note:  01/27/2023 7:41 AM  Shelley Douglas  has presented today for surgery, with the diagnosis of eri.  The various methods of treatment have been discussed with the patient and family. After consideration of risks, benefits and other options for treatment, the patient has consented to  Procedure(s): ICD GENERATOR CHANGEOUT (N/A) as a surgical intervention.  The patient's history has been reviewed, patient examined, no change in status, stable for surgery.  I have reviewed the patient's chart and labs.  Questions were answered to the patient's satisfaction.     Merlyn Conley [employer]

## 2023-01-27 NOTE — Telephone Encounter (Signed)
I called patient to review eliquis after her gen change.   Per Dr. Elberta Fortis, she is to resume eliquis 1/26 with PM dose.   I attempted to call patient to relay this information, VM full.  I attempted to call patient's spouse (Lemmie). No answer, left VM   Will send mychart message.

## 2023-01-28 LAB — IMMUNOFIXATION, URINE

## 2023-01-30 LAB — MULTIPLE MYELOMA PANEL, SERUM
Albumin SerPl Elph-Mcnc: 3.5 g/dL (ref 2.9–4.4)
Albumin/Glob SerPl: 0.9 (ref 0.7–1.7)
Alpha 1: 0.3 g/dL (ref 0.0–0.4)
Alpha2 Glob SerPl Elph-Mcnc: 0.8 g/dL (ref 0.4–1.0)
B-Globulin SerPl Elph-Mcnc: 1.3 g/dL (ref 0.7–1.3)
Gamma Glob SerPl Elph-Mcnc: 1.8 g/dL (ref 0.4–1.8)
Globulin, Total: 4.2 g/dL — ABNORMAL HIGH (ref 2.2–3.9)
IgA: 404 mg/dL (ref 64–422)
IgG (Immunoglobin G), Serum: 1728 mg/dL — ABNORMAL HIGH (ref 586–1602)
IgM (Immunoglobulin M), Srm: 135 mg/dL (ref 26–217)
Total Protein ELP: 7.7 g/dL (ref 6.0–8.5)

## 2023-01-31 MED FILL — Midazolam HCl Inj 2 MG/2ML (Base Equivalent): INTRAMUSCULAR | Qty: 1 | Status: AC

## 2023-02-02 NOTE — Telephone Encounter (Signed)
Attempted to reach pt again after not hearing back from her for confirmation of instructions. Forwarding to NP for her FYI

## 2023-02-04 ENCOUNTER — Ambulatory Visit (HOSPITAL_COMMUNITY)
Admission: RE | Admit: 2023-02-04 | Discharge: 2023-02-04 | Disposition: A | Discharge status: Home / Self Care | Payer: Medicare Other | Source: Ambulatory Visit | Attending: Cardiology | Admitting: Cardiology

## 2023-02-04 ENCOUNTER — Other Ambulatory Visit (HOSPITAL_COMMUNITY): Payer: Medicare Other

## 2023-02-04 DIAGNOSIS — I5022 Chronic systolic (congestive) heart failure: Secondary | ICD-10-CM | POA: Insufficient documentation

## 2023-02-04 LAB — BASIC METABOLIC PANEL
Anion gap: 11 (ref 5–15)
BUN: 13 mg/dL (ref 8–23)
CO2: 24 mmol/L (ref 22–32)
Calcium: 10.8 mg/dL — ABNORMAL HIGH (ref 8.9–10.3)
Chloride: 105 mmol/L (ref 98–111)
Creatinine, Ser: 1.3 mg/dL — ABNORMAL HIGH (ref 0.44–1.00)
GFR, Estimated: 44 mL/min — ABNORMAL LOW (ref >60)
Glucose, Bld: 115 mg/dL — ABNORMAL HIGH (ref 70–99)
Potassium: 4.1 mmol/L (ref 3.5–5.1)
Sodium: 140 mmol/L (ref 135–145)

## 2023-02-10 ENCOUNTER — Ambulatory Visit: Payer: Medicare Other | Attending: Internal Medicine

## 2023-02-10 DIAGNOSIS — I5022 Chronic systolic (congestive) heart failure: Secondary | ICD-10-CM | POA: Diagnosis not present

## 2023-02-10 LAB — CUP PACEART INCLINIC DEVICE CHECK
Date Time Interrogation Session: 20250206172352
Implantable Lead Connection Status: 753985
Implantable Lead Connection Status: 753985
Implantable Lead Connection Status: 753985
Implantable Lead Implant Date: 20090720
Implantable Lead Implant Date: 20090720
Implantable Lead Implant Date: 20170627
Implantable Lead Location: 753858
Implantable Lead Location: 753859
Implantable Lead Location: 753860
Implantable Lead Model: 4598
Implantable Lead Model: 5076
Implantable Lead Model: 6947
Implantable Pulse Generator Implant Date: 20250123

## 2023-02-10 MED ORDER — DIGOXIN 125 MCG PO TABS: 0.1250 mg | ORAL_TABLET | Freq: Every day | ORAL | 1 refills | Status: DC

## 2023-02-10 NOTE — Patient Instructions (Signed)

## 2023-02-10 NOTE — Progress Notes (Signed)
 Normal ICD wound check. Wound well healed. Normal in-clinic ICD check. Thresholds, sensing, and impedance WNL or stable for patient over time. No episodes. Pt enrolled in remote follow-up.

## 2023-02-15 ENCOUNTER — Telehealth: Payer: Self-pay | Admitting: Cardiovascular Disease

## 2023-02-15 MED ORDER — AMIODARONE HCL 200 MG PO TABS: 200.0000 mg | ORAL_TABLET | Freq: Two times a day (BID) | ORAL | 3 refills | Status: DC

## 2023-02-15 NOTE — Telephone Encounter (Signed)
*  STAT* If patient is at the pharmacy, call can be transferred to refill team.   1. Which medications need to be refilled? (please list name of each medication and dose if known) amiodarone (PACERONE) 200 MG tablet  2. Which pharmacy/location (including street and city if local pharmacy) is medication to be sent to? [id number] - [place], [place] - [address]  3. Do they need a 30 day or 90 day supply?   90 day supply  Patient is completely out of medication

## 2023-02-15 NOTE — Telephone Encounter (Signed)
Pt's medication was sent to pt's pharmacy as requested. Confirmation received.

## 2023-02-16 ENCOUNTER — Telehealth (HOSPITAL_COMMUNITY): Payer: Self-pay | Admitting: *Deleted

## 2023-02-16 DIAGNOSIS — I5022 Chronic systolic (congestive) heart failure: Secondary | ICD-10-CM

## 2023-02-16 DIAGNOSIS — I480 Paroxysmal atrial fibrillation: Secondary | ICD-10-CM

## 2023-02-16 MED ORDER — DIGOXIN 62.5 MCG PO TABS: 0.0625 mg | ORAL_TABLET | Freq: Every day | ORAL | 3 refills | Status: AC

## 2023-02-16 NOTE — Telephone Encounter (Signed)
Called patient's husband phone and spoke with patient per Brynda Peon, NP with following:  "Digoxin level elevated. Decrease digoxin level to 0.0625 mg daily."  Patient may take 1/2 of her current Digoxin pill. Updated Rx sent in with corrected dose. Pt verbalized understanding of same.

## 2023-02-16 NOTE — Progress Notes (Signed)
Remote ICD transmission.

## 2023-02-28 ENCOUNTER — Other Ambulatory Visit (HOSPITAL_COMMUNITY): Payer: Self-pay | Admitting: Cardiology

## 2023-02-28 MED ORDER — AMIODARONE HCL 200 MG PO TABS: 200.0000 mg | ORAL_TABLET | Freq: Two times a day (BID) | ORAL | 3 refills | Status: DC

## 2023-02-28 MED ORDER — FUROSEMIDE 40 MG PO TABS: 40.0000 mg | ORAL_TABLET | Freq: Every day | ORAL | 3 refills | Status: AC

## 2023-02-28 MED ORDER — METOPROLOL SUCCINATE ER 25 MG PO TB24: 12.5000 mg | ORAL_TABLET | Freq: Every day | ORAL | 3 refills | Status: DC

## 2023-02-28 MED ORDER — SPIRONOLACTONE 25 MG PO TABS: 25.0000 mg | ORAL_TABLET | Freq: Every day | ORAL | 6 refills | Status: DC

## 2023-02-28 MED ORDER — SACUBITRIL-VALSARTAN 24-26 MG PO TABS: 1.0000 | ORAL_TABLET | Freq: Two times a day (BID) | ORAL | 3 refills | Status: DC

## 2023-02-28 MED ORDER — EMPAGLIFLOZIN 10 MG PO TABS: 10.0000 mg | ORAL_TABLET | Freq: Every day | ORAL | 3 refills | Status: AC

## 2023-02-28 MED ORDER — ATORVASTATIN CALCIUM 80 MG PO TABS: 80.0000 mg | ORAL_TABLET | Freq: Every day | ORAL | 3 refills | Status: AC

## 2023-02-28 MED ORDER — APIXABAN 5 MG PO TABS: 5.0000 mg | ORAL_TABLET | Freq: Two times a day (BID) | ORAL | 3 refills | Status: DC

## 2023-03-01 ENCOUNTER — Telehealth: Payer: Self-pay | Admitting: Cardiovascular Disease

## 2023-03-01 NOTE — Telephone Encounter (Signed)
 Pt c/o medication issue:  1. Name of Medication:   sacubitril-valsartan (ENTRESTO) 24-26 MG   2. How are you currently taking this medication (dosage and times per day)?   As prescribed  3. Are you having a reaction (difficulty breathing--STAT)?   No  4. What is your medication issue?   Patient wants to know if she needs to continue taking this medication.  Patient stated she is now out of this medication.

## 2023-03-02 NOTE — Telephone Encounter (Signed)
 Requested medication was refills 2/24, Returned call with details left on VM perf DPR

## 2023-03-14 ENCOUNTER — Telehealth: Payer: Self-pay | Admitting: Cardiovascular Disease

## 2023-03-14 MED ORDER — APIXABAN 5 MG PO TABS: 5.0000 mg | ORAL_TABLET | Freq: Two times a day (BID) | ORAL | 1 refills | Status: AC

## 2023-03-14 NOTE — Telephone Encounter (Signed)
*  STAT* If patient is at the pharmacy, call can be transferred to refill team.   1. Which medications need to be refilled? (please list name of each medication and dose if known)   apixaban (ELIQUIS) 5 MG TABS tablet    2. Which pharmacy/location (including street and city if local pharmacy) is medication to be sent to?  [id number] - [place], [place] - [address]      3. Do they need a 30 day or 90 day supply? 90 day    Pt is out of medication

## 2023-03-14 NOTE — Telephone Encounter (Signed)
 Prescription refill request for Eliquis received. Indication: AF Last office visit: 01/04/23  Janeece Riggers NP Scr: 1.30 on 02/04/23  Epic Age: 73 Weight: 89.9kg  Based on above findings Eliquis 5mg  twice daily is the appropriate dose.  Refill approved.

## 2023-04-05 ENCOUNTER — Ambulatory Visit (INDEPENDENT_AMBULATORY_CARE_PROVIDER_SITE_OTHER): Payer: Medicare Other

## 2023-04-05 DIAGNOSIS — I472 Ventricular tachycardia, unspecified: Secondary | ICD-10-CM

## 2023-04-06 LAB — CUP PACEART REMOTE DEVICE CHECK
Battery Remaining Longevity: 111 mo
Battery Voltage: 3.1 V
Brady Statistic AP VP Percent: 0.2 %
Brady Statistic AP VS Percent: 0.02 %
Brady Statistic AS VP Percent: 98.66 %
Brady Statistic AS VS Percent: 1.13 %
Brady Statistic RA Percent Paced: 0.28 %
Brady Statistic RV Percent Paced: 98.85 %
Date Time Interrogation Session: 20250401004145
HighPow Impedance: 49 Ohm
HighPow Impedance: 64 Ohm
Implantable Lead Connection Status: 753985
Implantable Lead Connection Status: 753985
Implantable Lead Connection Status: 753985
Implantable Lead Implant Date: 20090720
Implantable Lead Implant Date: 20090720
Implantable Lead Implant Date: 20170627
Implantable Lead Location: 753858
Implantable Lead Location: 753859
Implantable Lead Location: 753860
Implantable Lead Model: 4598
Implantable Lead Model: 5076
Implantable Lead Model: 6947
Implantable Pulse Generator Implant Date: 20250123
Lead Channel Impedance Value: 1102 Ohm
Lead Channel Impedance Value: 1121 Ohm
Lead Channel Impedance Value: 304 Ohm
Lead Channel Impedance Value: 361 Ohm
Lead Channel Impedance Value: 418 Ohm
Lead Channel Impedance Value: 456 Ohm
Lead Channel Impedance Value: 475 Ohm
Lead Channel Impedance Value: 494 Ohm
Lead Channel Impedance Value: 646 Ohm
Lead Channel Impedance Value: 703 Ohm
Lead Channel Impedance Value: 703 Ohm
Lead Channel Impedance Value: 722 Ohm
Lead Channel Impedance Value: 969 Ohm
Lead Channel Pacing Threshold Amplitude: 0.375 V
Lead Channel Pacing Threshold Amplitude: 0.75 V
Lead Channel Pacing Threshold Amplitude: 1.5 V
Lead Channel Pacing Threshold Pulse Width: 0.4 ms
Lead Channel Pacing Threshold Pulse Width: 0.4 ms
Lead Channel Pacing Threshold Pulse Width: 0.4 ms
Lead Channel Sensing Intrinsic Amplitude: 4.9 mV
Lead Channel Sensing Intrinsic Amplitude: 7.5 mV
Lead Channel Setting Pacing Amplitude: 1.5 V
Lead Channel Setting Pacing Amplitude: 2 V
Lead Channel Setting Pacing Amplitude: 2.25 V
Lead Channel Setting Pacing Pulse Width: 0.4 ms
Lead Channel Setting Pacing Pulse Width: 0.4 ms
Lead Channel Setting Sensing Sensitivity: 0.3 mV
Zone Setting Status: 755011
Zone Setting Status: 755011

## 2023-04-25 NOTE — Progress Notes (Deleted)
 Electrophysiology Office Note:   Date:  04/25/2023  ID:  KANCHAN GAL, DOB 11/25/1950, MRN [id number]  Primary Cardiologist: Janelle Mediate, MD Primary Heart Failure: None Electrophysiologist: Will Cortland Ding, MD   {Click to update primary MD,subspecialty MD or APP then REFRESH:1}    History of Present Illness:   Shelley Douglas is a 73 y.o. female with h/o chronic sCHF, VT s/p ICD, LBBB, paroxysmal AF, HTN, MV annuloplasty 1996, CAD, CVA, PE/DVT, DM II seen today for routine electrophysiology followup.   Remote monitoring showed RRT met 12/03/22. Admitted 12/9-12/16/24 for AFLwRVR, decompensated acute on chronic sCHF, AKI requiring diuresis, IV milrinone  for low output heart failure. She underwent cardioversion on 12/17/22 with conversion to NSR. She underwent generator change on    Since last being seen in our clinic the patient reports doing ***.  she denies chest pain, palpitations, dyspnea, PND, orthopnea, nausea, vomiting, dizziness, syncope, edema, weight gain, or early satiety.   Review of systems complete and found to be negative unless listed in HPI.   EP Information / Studies Reviewed:    EKG is ordered today. Personal review as below.      ICD Interrogation-  reviewed in detail today,  See PACEART report.  Device History: Medtronic BiV ICD implanted 07/24/07 with upgrade to CRT-D 07/01/15 for VT, HFrEF  Generator Change: 01/27/23  History of appropriate therapy: No History of AAD therapy: No   Studies:  TEE 12/17/22 > LVEF 30-35%, LAA surgically absent, LA severely dilated, RA severely dilated, MV repaired/replaced with trivial regurgitation, mild MS, septal leaflet appears trapped by the device lead with severe TR, TV regurgitation moderate to severe   Arrhythmia / AAD VT  Atrial Fibrillation  AFL  Risk Assessment/Calculations:    CHA2DS2-VASc Score = 8  {Confirm score is correct.  If not, click here to update score.  REFRESH note.  :1} This indicates a  10.8% annual risk of stroke. The patient's score is based upon: CHF History: 1 HTN History: 1 Diabetes History: 1 Stroke History: 2 Vascular Disease History: 1 Age Score: 1 Gender Score: 1   {This patient has a significant risk of stroke if diagnosed with atrial fibrillation.  Please consider VKA or DOAC agent for anticoagulation if the bleeding risk is acceptable.   You can also use the SmartPhrase .[email or web address] for documentation.   :[id number]} No BP recorded.  {Refresh Note OR Click here to enter BP  :1}***        Physical Exam:   VS:  There were no vitals taken for this visit.   Wt Readings from Last 3 Encounters:  01/25/23 194 lb 9.6 oz (88.3 kg)  01/04/23 198 lb 3.2 oz (89.9 kg)  12/20/22 191 lb 12.8 oz (87 kg)     GEN: Well nourished, well developed in no acute distress NECK: No JVD; No carotid bruits CARDIAC: {EPRHYTHM:28826}, no murmurs, rubs, gallops RESPIRATORY:  Clear to auscultation without rales, wheezing or rhonchi  ABDOMEN: Soft, non-tender, non-distended EXTREMITIES:  No edema; No deformity   ASSESSMENT AND PLAN:    Chronic Systolic Dysfunction, VT s/p Medtronic CRT-D  LBBB -euvolemic on exam and by thoracic impedence *** -Stable on an appropriate medical regimen -Normal ICD function -See Pace Art report -No changes today -***BiV pacing   Paroxysmal Atrial Fibrillation  Atrial Flutter  -OAC for stroke prophylaxis  -consider reduction to 200 mg daily if no further AF/AFL   Secondary Hypercoagulable State  -continue Eliquis  5mg  BID, dose reviewed and appropriate by age/wt  Hx PE/DVT  S/p IVC filter, historically poor compliance with eliquis   -most recent V/Q scan with low likelihood of PE   Disposition:   Follow up with Dr. Lawana Pray {EPFOLLOW XB:14782}  Signed, Creighton Doffing, NP-C, AGACNP-BC [facility] - Electrophysiology  04/25/2023, 3:40 PM

## 2023-04-29 ENCOUNTER — Other Ambulatory Visit: Payer: Self-pay | Admitting: Internal Medicine

## 2023-04-29 DIAGNOSIS — Z1231 Encounter for screening mammogram for malignant neoplasm of breast: Secondary | ICD-10-CM

## 2023-05-02 ENCOUNTER — Ambulatory Visit: Payer: Medicare Other | Attending: Pulmonary Disease | Admitting: Pulmonary Disease

## 2023-05-02 DIAGNOSIS — I5022 Chronic systolic (congestive) heart failure: Secondary | ICD-10-CM

## 2023-05-02 DIAGNOSIS — I472 Ventricular tachycardia, unspecified: Secondary | ICD-10-CM

## 2023-05-02 DIAGNOSIS — I480 Paroxysmal atrial fibrillation: Secondary | ICD-10-CM

## 2023-05-02 DIAGNOSIS — D6869 Other thrombophilia: Secondary | ICD-10-CM

## 2023-05-19 NOTE — Addendum Note (Signed)
 Addended by: Lott Rouleau A on: 05/19/2023 09:10 AM   Modules accepted: Orders

## 2023-05-19 NOTE — Progress Notes (Signed)
 Remote ICD transmission.

## 2023-05-25 ENCOUNTER — Ambulatory Visit: Payer: Self-pay

## 2023-06-04 LAB — COLOGUARD: COLOGUARD: NEGATIVE

## 2023-07-05 ENCOUNTER — Ambulatory Visit (INDEPENDENT_AMBULATORY_CARE_PROVIDER_SITE_OTHER): Payer: Medicare Other

## 2023-07-05 DIAGNOSIS — I472 Ventricular tachycardia, unspecified: Secondary | ICD-10-CM | POA: Diagnosis not present

## 2023-07-05 LAB — CUP PACEART REMOTE DEVICE CHECK
Battery Remaining Longevity: 106 mo
Battery Voltage: 3.02 V
Brady Statistic RV Percent Paced: 97.62 %
Date Time Interrogation Session: 20250701031447
HighPow Impedance: 53 Ohm
HighPow Impedance: 68 Ohm
Implantable Lead Connection Status: 753985
Implantable Lead Connection Status: 753985
Implantable Lead Connection Status: 753985
Implantable Lead Implant Date: 20090720
Implantable Lead Implant Date: 20090720
Implantable Lead Implant Date: 20170627
Implantable Lead Location: 753858
Implantable Lead Location: 753859
Implantable Lead Location: 753860
Implantable Lead Model: 4598
Implantable Lead Model: 5076
Implantable Lead Model: 6947
Implantable Pulse Generator Implant Date: 20250123
Lead Channel Impedance Value: 1026 Ohm
Lead Channel Impedance Value: 1178 Ohm
Lead Channel Impedance Value: 1216 Ohm
Lead Channel Impedance Value: 323 Ohm
Lead Channel Impedance Value: 323 Ohm
Lead Channel Impedance Value: 418 Ohm
Lead Channel Impedance Value: 475 Ohm
Lead Channel Impedance Value: 494 Ohm
Lead Channel Impedance Value: 532 Ohm
Lead Channel Impedance Value: 703 Ohm
Lead Channel Impedance Value: 722 Ohm
Lead Channel Impedance Value: 760 Ohm
Lead Channel Impedance Value: 779 Ohm
Lead Channel Pacing Threshold Amplitude: 0.5 V
Lead Channel Pacing Threshold Amplitude: 0.75 V
Lead Channel Pacing Threshold Amplitude: 1.5 V
Lead Channel Pacing Threshold Pulse Width: 0.4 ms
Lead Channel Pacing Threshold Pulse Width: 0.4 ms
Lead Channel Pacing Threshold Pulse Width: 0.4 ms
Lead Channel Sensing Intrinsic Amplitude: 4.3 mV
Lead Channel Sensing Intrinsic Amplitude: 8.4 mV
Lead Channel Setting Pacing Amplitude: 1.5 V
Lead Channel Setting Pacing Amplitude: 2 V
Lead Channel Setting Pacing Amplitude: 2.25 V
Lead Channel Setting Pacing Pulse Width: 0.4 ms
Lead Channel Setting Pacing Pulse Width: 0.4 ms
Lead Channel Setting Sensing Sensitivity: 0.3 mV
Zone Setting Status: 755011
Zone Setting Status: 755011

## 2023-07-11 ENCOUNTER — Ambulatory Visit: Payer: Self-pay | Admitting: Cardiology

## 2023-08-12 ENCOUNTER — Telehealth: Payer: Self-pay | Admitting: Cardiology

## 2023-08-12 NOTE — Telephone Encounter (Addendum)
 Alert notification received from [employer]:  Alert remote transmission:  Avg. V. Rate During AT/AF > Threshold. AF in progress from 8/7 @ 21:30, poor rate control, Eliquis  per EPIC - route to triage   Outreach made to patient:  No answer & no voice mail at the patient's primary contact number.  Attempted to contact the patient's spouse at [phone]- no answer- LMTC.   Patient also no showed for her 91 day follow up- will need to r/s this appointment.

## 2023-08-16 NOTE — Telephone Encounter (Signed)
 Message sent to scheduling to schedule Pt for overdue follow up.  Pt no show for 91 day follow up post generator change out.

## 2023-09-06 ENCOUNTER — Encounter (HOSPITAL_BASED_OUTPATIENT_CLINIC_OR_DEPARTMENT_OTHER): Payer: Self-pay

## 2023-09-06 NOTE — Progress Notes (Unsigned)
  Electrophysiology Office Note:   ID:  [name], [name] 11-26-1950, MRN [id number]  Primary Cardiologist: Maude Emmer, MD Electrophysiologist: Will Gladis Norton, MD  {Click to update primary MD,subspecialty MD or APP then REFRESH:1}    History of Present Illness:   Shelley Douglas is a 73 y.o. female with h/o  chronic sCHF, VT s/p ICD, LBBB, paroxysmal AF, HTN, MV annuloplasty 1996, CAD, CVA, PE/DVT, and DM II seen today for routine electrophysiology followup.   Since last being seen in our clinic the patient reports doing ***.  she denies chest pain, palpitations, dyspnea, PND, orthopnea, nausea, vomiting, dizziness, syncope, edema, weight gain, or early satiety.   Review of systems complete and found to be negative unless listed in HPI.   EP Information / Studies Reviewed:    EKG is ordered today. Personal review as below.       ICD Interrogation-  reviewed in detail today,  See PACEART report.  Arrhythmia/Device History BiV ICD-Medtronic-Carelink  06-20-2015 DPR on file to speak with husband Limmie Therrell at [phone] and may leave detailed cell phone message on [phone]   Physical Exam:   VS:  There were no vitals taken for this visit.   Wt Readings from Last 3 Encounters:  01/25/23 194 lb 9.6 oz (88.3 kg)  01/04/23 198 lb 3.2 oz (89.9 kg)  12/20/22 191 lb 12.8 oz (87 kg)     GEN: No acute distress *** NECK: No JVD; No carotid bruits CARDIAC: {EPRHYTHM:28826}, no murmurs, rubs, gallops RESPIRATORY:  Clear to auscultation without rales, wheezing or rhonchi  ABDOMEN: Soft, non-tender, non-distended EXTREMITIES:  {EDEMA LEVEL:28147::No} edema; No deformity   ASSESSMENT AND PLAN:    Chronic systolic CHF  s/p Medtronic CRT-D  euvolemic today Stable on an appropriate medical regimen Normal ICD function See Pace Art report No changes today  Paroxysmal AF Paroxysmal AFL Continue amiodarone  *** Continue eliquis  5 mg BID for CHA2DS2VASc of at  least 8   Secondary hypercoagulable state Pt on Eliquis  as above   H/o PE/DVT S/p IVC filter   Disposition:   Follow up with {EPPROVIDERS:28135::EP Team} {EPFOLLOW UP:28173}   Signed, Ozell Prentice Passey, PA-C

## 2023-09-08 ENCOUNTER — Ambulatory Visit: Payer: Self-pay | Attending: Student | Admitting: Student

## 2023-09-08 ENCOUNTER — Ambulatory Visit: Payer: Self-pay | Admitting: Cardiology

## 2023-09-08 ENCOUNTER — Encounter: Payer: Self-pay | Admitting: Student

## 2023-09-08 VITALS — BP 124/88 | HR 84 | Ht 66.0 in | Wt 217.0 lb

## 2023-09-08 DIAGNOSIS — Z79899 Other long term (current) drug therapy: Secondary | ICD-10-CM

## 2023-09-08 DIAGNOSIS — I5022 Chronic systolic (congestive) heart failure: Secondary | ICD-10-CM

## 2023-09-08 DIAGNOSIS — I2510 Atherosclerotic heart disease of native coronary artery without angina pectoris: Secondary | ICD-10-CM

## 2023-09-08 DIAGNOSIS — I480 Paroxysmal atrial fibrillation: Secondary | ICD-10-CM

## 2023-09-08 LAB — CUP PACEART INCLINIC DEVICE CHECK
Battery Remaining Longevity: 102 mo
Battery Voltage: 3 V
Brady Statistic AP VP Percent: 0.26 %
Brady Statistic AP VS Percent: 0.02 %
Brady Statistic AS VP Percent: 95.71 %
Brady Statistic AS VS Percent: 4.01 %
Brady Statistic RA Percent Paced: 0.38 %
Brady Statistic RV Percent Paced: 95.92 %
Date Time Interrogation Session: 20250904115636
HighPow Impedance: 44 Ohm
HighPow Impedance: 74 Ohm
Implantable Lead Connection Status: 753985
Implantable Lead Connection Status: 753985
Implantable Lead Connection Status: 753985
Implantable Lead Implant Date: 20090720
Implantable Lead Implant Date: 20090720
Implantable Lead Implant Date: 20170627
Implantable Lead Location: 753858
Implantable Lead Location: 753859
Implantable Lead Location: 753860
Implantable Lead Model: 4598
Implantable Lead Model: 5076
Implantable Lead Model: 6947
Implantable Pulse Generator Implant Date: 20250123
Lead Channel Impedance Value: 1140 Ohm
Lead Channel Impedance Value: 1140 Ohm
Lead Channel Impedance Value: 285 Ohm
Lead Channel Impedance Value: 285 Ohm
Lead Channel Impedance Value: 342 Ohm
Lead Channel Impedance Value: 456 Ohm
Lead Channel Impedance Value: 456 Ohm
Lead Channel Impedance Value: 475 Ohm
Lead Channel Impedance Value: 646 Ohm
Lead Channel Impedance Value: 684 Ohm
Lead Channel Impedance Value: 703 Ohm
Lead Channel Impedance Value: 722 Ohm
Lead Channel Impedance Value: 950 Ohm
Lead Channel Pacing Threshold Amplitude: 0.5 V
Lead Channel Pacing Threshold Amplitude: 0.5 V
Lead Channel Pacing Threshold Amplitude: 0.625 V
Lead Channel Pacing Threshold Amplitude: 0.75 V
Lead Channel Pacing Threshold Amplitude: 1.125 V
Lead Channel Pacing Threshold Amplitude: 1.5 V
Lead Channel Pacing Threshold Pulse Width: 0.4 ms
Lead Channel Pacing Threshold Pulse Width: 0.4 ms
Lead Channel Pacing Threshold Pulse Width: 0.4 ms
Lead Channel Pacing Threshold Pulse Width: 0.4 ms
Lead Channel Pacing Threshold Pulse Width: 0.4 ms
Lead Channel Pacing Threshold Pulse Width: 0.4 ms
Lead Channel Sensing Intrinsic Amplitude: 4.9 mV
Lead Channel Sensing Intrinsic Amplitude: 7.4 mV
Lead Channel Setting Pacing Amplitude: 1.5 V
Lead Channel Setting Pacing Amplitude: 2 V
Lead Channel Setting Pacing Amplitude: 2.25 V
Lead Channel Setting Pacing Pulse Width: 0.4 ms
Lead Channel Setting Pacing Pulse Width: 0.4 ms
Lead Channel Setting Sensing Sensitivity: 0.3 mV
Zone Setting Status: 755011
Zone Setting Status: 755011

## 2023-09-08 NOTE — Patient Instructions (Signed)
 Medication Instructions:  You may take an extra dose of your lasix  for the next 2 days, then resume usual dosing. *If you need a refill on your cardiac medications before your next appointment, please call your pharmacy*  Lab Work: CMET, TSH, FREE T4-TODAY If you have labs (blood work) drawn today and your tests are completely normal, you will receive your results only by: MyChart Message (if you have MyChart) OR A paper copy in the mail If you have any lab test that is abnormal or we need to change your treatment, we will call you to review the results.  Follow-Up: At [facility], you and your health needs are our priority.  As part of our continuing mission to provide you with exceptional heart care, our providers are all part of one team.  This team includes your primary Cardiologist (physician) and Advanced Practice Providers or APPs (Physician Assistants and Nurse Practitioners) who all work together to provide you with the care you need, when you need it.  Your next appointment:   6 month(s)  Provider:   You may see Will Gladis Norton, MD or one of the following Advanced Practice Providers on your designated Care Team:   Charlies Arthur, PA-C Michael Andy Tillery, PA-C Suzann Riddle, NP Daphne Barrack, NP

## 2023-09-10 LAB — COMPREHENSIVE METABOLIC PANEL WITH GFR
ALT: 8 IU/L (ref 0–32)
AST: 14 IU/L (ref 0–40)
Albumin: 3.9 g/dL (ref 3.8–4.8)
Alkaline Phosphatase: 92 IU/L (ref 44–121)
BUN/Creatinine Ratio: 13 (ref 12–28)
BUN: 15 mg/dL (ref 8–27)
Bilirubin Total: 0.4 mg/dL (ref 0.0–1.2)
CO2: 23 mmol/L (ref 20–29)
Calcium: 10.6 mg/dL — ABNORMAL HIGH (ref 8.7–10.3)
Chloride: 104 mmol/L (ref 96–106)
Creatinine, Ser: 1.16 mg/dL — ABNORMAL HIGH (ref 0.57–1.00)
Globulin, Total: 3.5 g/dL (ref 1.5–4.5)
Glucose: 114 mg/dL — ABNORMAL HIGH (ref 70–99)
Potassium: 4.3 mmol/L (ref 3.5–5.2)
Sodium: 142 mmol/L (ref 134–144)
Total Protein: 7.4 g/dL (ref 6.0–8.5)
eGFR: 50 mL/min/1.73 — ABNORMAL LOW (ref >59)

## 2023-09-10 LAB — T4, FREE: Free T4: 1.14 ng/dL (ref 0.82–1.77)

## 2023-09-10 LAB — TSH: TSH: 0.633 u[IU]/mL (ref 0.450–4.500)

## 2023-10-04 ENCOUNTER — Ambulatory Visit (INDEPENDENT_AMBULATORY_CARE_PROVIDER_SITE_OTHER): Payer: Medicare Other

## 2023-10-04 DIAGNOSIS — I480 Paroxysmal atrial fibrillation: Secondary | ICD-10-CM

## 2023-10-05 ENCOUNTER — Ambulatory Visit: Payer: Self-pay | Admitting: Cardiology

## 2023-10-05 LAB — CUP PACEART REMOTE DEVICE CHECK
Battery Remaining Longevity: 99 mo
Battery Voltage: 3 V
Brady Statistic AP VP Percent: 0.2 %
Brady Statistic AP VS Percent: 0.02 %
Brady Statistic AS VP Percent: 89.8 %
Brady Statistic AS VS Percent: 9.98 %
Brady Statistic RA Percent Paced: 0.26 %
Brady Statistic RV Percent Paced: 89.99 %
Date Time Interrogation Session: 20250930051125
HighPow Impedance: 48 Ohm
HighPow Impedance: 64 Ohm
Implantable Lead Connection Status: 753985
Implantable Lead Connection Status: 753985
Implantable Lead Connection Status: 753985
Implantable Lead Implant Date: 20090720
Implantable Lead Implant Date: 20090720
Implantable Lead Implant Date: 20170627
Implantable Lead Location: 753858
Implantable Lead Location: 753859
Implantable Lead Location: 753860
Implantable Lead Model: 4598
Implantable Lead Model: 5076
Implantable Lead Model: 6947
Implantable Pulse Generator Implant Date: 20250123
Lead Channel Impedance Value: 285 Ohm
Lead Channel Impedance Value: 285 Ohm
Lead Channel Impedance Value: 342 Ohm
Lead Channel Impedance Value: 399 Ohm
Lead Channel Impedance Value: 418 Ohm
Lead Channel Impedance Value: 494 Ohm
Lead Channel Impedance Value: 570 Ohm
Lead Channel Impedance Value: 608 Ohm
Lead Channel Impedance Value: 627 Ohm
Lead Channel Impedance Value: 646 Ohm
Lead Channel Impedance Value: 855 Ohm
Lead Channel Impedance Value: 969 Ohm
Lead Channel Impedance Value: 969 Ohm
Lead Channel Pacing Threshold Amplitude: 0.5 V
Lead Channel Pacing Threshold Amplitude: 0.625 V
Lead Channel Pacing Threshold Amplitude: 1.625 V
Lead Channel Pacing Threshold Pulse Width: 0.4 ms
Lead Channel Pacing Threshold Pulse Width: 0.4 ms
Lead Channel Pacing Threshold Pulse Width: 0.4 ms
Lead Channel Sensing Intrinsic Amplitude: 4 mV
Lead Channel Sensing Intrinsic Amplitude: 7 mV
Lead Channel Setting Pacing Amplitude: 1.5 V
Lead Channel Setting Pacing Amplitude: 2 V
Lead Channel Setting Pacing Amplitude: 2.25 V
Lead Channel Setting Pacing Pulse Width: 0.4 ms
Lead Channel Setting Pacing Pulse Width: 0.4 ms
Lead Channel Setting Sensing Sensitivity: 0.3 mV
Zone Setting Status: 755011
Zone Setting Status: 755011

## 2023-10-10 NOTE — Progress Notes (Signed)
 Remote ICD Transmission

## 2023-10-13 NOTE — Progress Notes (Signed)
 Remote ICD Transmission

## 2023-10-17 ENCOUNTER — Telehealth: Payer: Self-pay | Admitting: Cardiology

## 2023-10-17 NOTE — Telephone Encounter (Signed)
 Patient came in and dropped off Chronic Condition Verification form from [facility]. Patient asked that it be filled out and faxed to [facility].

## 2023-10-27 NOTE — Telephone Encounter (Signed)
 Pt number disconnected/not working [employer] on husband's [facility] phone asking pt to call office and speak with me

## 2023-10-28 ENCOUNTER — Telehealth: Payer: Self-pay

## 2023-10-28 NOTE — Telephone Encounter (Signed)
 Alert received:  Alert remote transmission:  AT/AF Daily Burden > Threshold. AF in progress from 10/23, poor rate control, Eliquis  per EPIC - Route to triage  Attemped to contact Pt.  Phone rang and rang and did not go to a VM.  Outreach then made to Pt's husband.  Left VM requesting call back.

## 2023-10-31 ENCOUNTER — Telehealth: Payer: Self-pay | Admitting: *Deleted

## 2023-10-31 NOTE — Telephone Encounter (Signed)
 Chronic condition form filled out/signed. Sent to medical records for scanning. Left sign original at front desk for pt to pick up. She is agreeable to plan.

## 2023-11-04 NOTE — Telephone Encounter (Signed)
 Patient called back to clinic  This RN assessed for symptoms  Patient confirmed feeling dizzy and lightheaded at times but denies feeling like she may pass out  This RN suggested seeing a provider at the Afib clinic for Afib management  Patient agreeable to this  Patient scheduled with Orlando Heinrich, PA-C on Monday 11/07/23 at 11am  Date, time, and location of appointment was verbally confirmed by patient to this RN   ED precautions reviewed with patient going into the weekend and patient verbally acknowledged understanding  All questions and concerns addressed at this time  Patient appreciative for call and assistance getting appointment by this RN

## 2023-11-04 NOTE — Telephone Encounter (Addendum)
 Attempted outreach made to patient. No answer. Unable to leave voicemail as number is no longer in service.   Attempted outreach to patient's spouse. No answer. Left detailed message to call back.

## 2023-11-07 ENCOUNTER — Ambulatory Visit (HOSPITAL_COMMUNITY)
Admission: RE | Admit: 2023-11-07 | Discharge: 2023-11-07 | Disposition: A | Discharge status: Home / Self Care | Payer: Self-pay | Source: Ambulatory Visit | Attending: Internal Medicine | Admitting: Internal Medicine

## 2023-11-07 ENCOUNTER — Encounter (HOSPITAL_COMMUNITY): Payer: Self-pay | Admitting: Internal Medicine

## 2023-11-07 VITALS — BP 138/90 | HR 78 | Ht 66.0 in | Wt 212.2 lb

## 2023-11-07 DIAGNOSIS — Z79899 Other long term (current) drug therapy: Secondary | ICD-10-CM | POA: Diagnosis not present

## 2023-11-07 DIAGNOSIS — Z5181 Encounter for therapeutic drug level monitoring: Secondary | ICD-10-CM

## 2023-11-07 DIAGNOSIS — I480 Paroxysmal atrial fibrillation: Secondary | ICD-10-CM | POA: Diagnosis not present

## 2023-11-07 DIAGNOSIS — D6869 Other thrombophilia: Secondary | ICD-10-CM

## 2023-11-07 MED ORDER — AMIODARONE HCL 200 MG PO TABS: 200.0000 mg | ORAL_TABLET | Freq: Every day | ORAL | 6 refills | Status: AC

## 2023-11-07 NOTE — Patient Instructions (Signed)
Decrease amiodarone to 200 mg once daily

## 2023-11-07 NOTE — Progress Notes (Signed)
 Primary Care Physician: Sim Emery CROME, MD Primary Cardiologist: Maude Emmer, MD Electrophysiologist: Will Gladis Norton, MD     Referring Physician: Device clinic     Shelley Douglas is a 73 y.o. female with a history of chronic CHF, VT s/p ICD, LBBB, HTN, MV annuloplasty 1996, CAD, CVA, PE/DVT, T2DM, and atrial fibrillation who presents for consultation in the [facility]. Device clinic alert on 10/24 for ongoing Afib since 10/23 with poor rate control. Patient is on Eliquis  for stroke prevention.  On evaluation today, patient is currently in AV paced rhythm. She has been taking amiodarone  200 mg twice daily since June per her report. She missed two doses of Eliquis  last week.   Today, she denies symptoms of palpitations, chest pain, orthopnea, PND, lower extremity edema, dizziness, presyncope, syncope, bleeding, or neurologic sequela. The patient is tolerating medications without difficulties and is otherwise without complaint today.    she has a BMI of Body mass index is 34.25 kg/m.SABRA Filed Weights   11/07/23 1109  Weight: 96.3 kg    Current Outpatient Medications  Medication Sig Dispense Refill   apixaban  (ELIQUIS ) 5 MG TABS tablet Take 1 tablet (5 mg total) by mouth 2 (two) times daily. 180 tablet 1   atorvastatin  (LIPITOR ) 80 MG tablet Take 1 tablet (80 mg total) by mouth daily. 90 tablet 3   digoxin  62.5 MCG TABS Take 0.0625 mg by mouth daily. 90 tablet 3   empagliflozin  (JARDIANCE ) 10 MG TABS tablet Take 1 tablet (10 mg total) by mouth daily. 30 tablet 3   furosemide  (LASIX ) 40 MG tablet Take 1 tablet (40 mg total) by mouth daily. 30 tablet 3   metoprolol  succinate (TOPROL -XL) 25 MG 24 hr tablet Take 0.5 tablets (12.5 mg total) by mouth daily. 15 tablet 3   sacubitril -valsartan  (ENTRESTO ) 24-26 MG Take 1 tablet by mouth 2 (two) times daily. 60 tablet 3   spironolactone  (ALDACTONE ) 25 MG tablet Take 1 tablet (25 mg total) by mouth daily.  30 tablet 6   amiodarone  (PACERONE ) 200 MG tablet Take 1 tablet (200 mg total) by mouth daily. 30 tablet 6   No current facility-administered medications for this encounter.    Atrial Fibrillation Management history:  Previous antiarrhythmic drugs: amiodarone  Previous cardioversions: 12/17/22 Previous ablations: none Anticoagulation history: Eliquis    ROS- All systems are reviewed and negative except as per the HPI above.  Physical Exam: BP (!) 138/90   Pulse 78   Ht 5' 6 (1.676 m)   Wt 96.3 kg   BMI 34.25 kg/m   GEN: Well nourished, well developed in no acute distress NECK: No JVD; No carotid bruits CARDIAC: Regular rate and rhythm, no murmurs, rubs, gallops RESPIRATORY:  Clear to auscultation without rales, wheezing or rhonchi  ABDOMEN: Soft, non-tender, non-distended EXTREMITIES:  No edema; No deformity   EKG today demonstrates  Vent. rate 78 BPM PR interval 116 ms QRS duration 128 ms QT/QTcB 424/483 ms P-R-T axes 56 268 76 Atrial-sensed ventricular-paced rhythm Biventricular pacemaker detected Abnormal ECG When compared with ECG of 08-Sep-2023 11:41, Premature ventricular complexes are no longer Present  Echo 12/14/22 demonstrated  1. Left ventricular ejection fraction, by estimation, is 20 to 25%. Left  ventricular ejection fraction by 2D MOD biplane is 22.3 %. The left  ventricle has severely decreased function. The left ventricle demonstrates  global hypokinesis. The left  ventricular internal cavity size was severely dilated. There is mild  concentric left ventricular hypertrophy. Left ventricular  diastolic  function could not be evaluated.   2. Right ventricular systolic function is severely reduced. The right  ventricular size is moderately enlarged. There is moderately elevated  pulmonary artery systolic pressure. The estimated right ventricular  systolic pressure is 48.9 mmHg.   3. Left atrial size was severely dilated.   4. Right atrial size was  severely dilated.   5. The mitral valve has been repaired/replaced. Moderate to severe mitral  valve regurgitation. Moderate mitral stenosis. The mean mitral valve  gradient is 6.7 mmHg with average heart rate of 68 bpm. There is a  prosthetic annuloplasty ring present in the   mitral position. Procedure Date: prior to 2013.   6. The tricuspid valve is abnormal. Tricuspid valve regurgitation is  moderate to severe.   7. The aortic valve has an indeterminant number of cusps. Aortic valve  regurgitation is not visualized. No aortic stenosis is present.   8. The inferior vena cava is dilated in size with <50% respiratory  variability, suggesting right atrial pressure of 15 mmHg.   Comparison(s): Prior images reviewed side by side. The left ventricular  function is worsened. The right ventricular systolic function is worse.  Mitral and tricuspid insufficiency ar worse. Right atrail pressure and  pulmonary artery pressure are higher.   ASSESSMENT & PLAN CHA2DS2-VASc Score = 8  The patient's score is based upon: CHF History: 1 HTN History: 1 Diabetes History: 1 Stroke History: 2 Vascular Disease History: 1 Age Score: 1 Gender Score: 1       ASSESSMENT AND PLAN: Paroxysmal Atrial Fibrillation / Flutter (ICD10:  I48.0) The patient's CHA2DS2-VASc score is 8, indicating a 10.8% annual risk of stroke.    Patient is currently in AV paced rhythm. She notes to have been taking amiodarone  200 mg BID since June. She has had breakthrough Afib twice last month. Will schedule follow up with EP to discuss if candidate for ablation. We went over procedure in detail and what to expect. Will have patient reduce to amiodarone  200 mg once daily.  High risk medication monitoring (ICD10: J342684) Patient requires ongoing monitoring for anti-arrhythmic medication which has the potential to cause life threatening arrhythmias or AV block. Qtc stable. Continue amiodarone  and transition to once  daily.   Secondary Hypercoagulable State (ICD10:  D68.69) The patient is at significant risk for stroke/thromboembolism based upon her CHA2DS2-VASc Score of 8.  Continue Apixaban  (Eliquis ).  Emphasis on continuing without interruption.    Follow up with EP to discuss ablation.   Terra Pac, [facility]  Afib Clinic [address] [place], [place] [address] [phone]

## 2023-12-06 NOTE — Progress Notes (Unsigned)
  Electrophysiology Office Note:   Date:  12/07/2023  ID:  Shelley Douglas, DOB 1950/11/30, MRN [id number]  Primary Cardiologist: Shelley Emmer, MD Primary Heart Failure: None Electrophysiologist: Shelley Smola Gladis Norton, MD      History of Present Illness:   Shelley Douglas is a 73 y.o. female with h/o systolic heart failure, ventricular tachycardia, with bundle branch block, atrial fibrillation, hypertension, mitral valve annuloplasty in 1996, coronary artery disease, CVA, PE/DVT, type 2 diabetes seen today for routine electrophysiology followup.   Since last being seen in our clinic the patient reports doing overall well.  She does note that her blood pressures are elevated at home.  She does have some palpitations and thinks that this is due to her atrial fibrillation.  Palpitations fortunately occur rarely, and she has a low atrial fibrillation burden noted on her device.  She has not been taking her metoprolol , Entresto , Aldactone .  She states that she has run out of the medications and that these medications were not stopped.  she denies chest pain, palpitations, dyspnea, PND, orthopnea, nausea, vomiting, dizziness, syncope, edema, weight gain, or early satiety.   Review of systems complete and found to be negative unless listed in HPI.      EP Information / Studies Reviewed:    EKG is not ordered today. EKG from 11/07/2023 reviewed which showed atrial sensed, ventricular paced      ICD Interrogation-  reviewed in detail today,  See PACEART report.  Device History: Medtronic BiV ICD implanted 07/24/2007, upgrade 07/01/2015, generator change 01/27/2023 for chronic systolic heart failure and VT History of appropriate therapy: No History of AAD therapy: Yes; currently on amiodarone    Risk Assessment/Calculations:    CHA2DS2-VASc Score = 8   This indicates a 10.8% annual risk of stroke. The patient's score is based upon: CHF History: 1 HTN History: 1 Diabetes History: 1 Stroke  History: 2 Vascular Disease History: 1 Age Score: 1 Gender Score: 1            Physical Exam:   VS:  BP (!) 150/100 (BP Location: Right Arm, Patient Position: Sitting, Cuff Size: Large)   Pulse 94   Ht 5' 6 (1.676 m)   Wt 216 lb 4.8 oz (98.1 kg)   SpO2 95%   BMI 34.91 kg/m    Wt Readings from Last 3 Encounters:  12/07/23 216 lb 4.8 oz (98.1 kg)  11/07/23 212 lb 3.2 oz (96.3 kg)  09/08/23 217 lb (98.4 kg)     GEN: Well nourished, well developed in no acute distress NECK: No JVD; No carotid bruits CARDIAC: Regular rate and rhythm, no murmurs, rubs, gallops RESPIRATORY:  Clear to auscultation without rales, wheezing or rhonchi  ABDOMEN: Soft, non-tender, non-distended EXTREMITIES:  No edema; No deformity   ASSESSMENT AND PLAN:    Chronic systolic dysfunction s/p Medtronic CRT-D  euvolemic today Shelley Douglas restart metoprolol , Entresto , Aldactone  Normal ICD function See Pace Art report No changes today  2.  Paroxysmal atrial fibrillation/flutter: On amiodarone .  She is continue to have episodes of atrial fibrillation, though they are rare on amiodarone .  At this point, she is happy being on amiodarone .  If she has significant recurrence, would potentially discuss ablation.  3.  Secondary hypercoagulable state: On Eliquis   4.  History of PE/DVT: Post IVC filter  Disposition:   Follow up with EP Team in 6 months   Signed, Shelley Boivin Gladis Norton, MD

## 2023-12-07 ENCOUNTER — Encounter: Payer: Self-pay | Admitting: Cardiology

## 2023-12-07 ENCOUNTER — Ambulatory Visit: Attending: Cardiology | Admitting: Cardiology

## 2023-12-07 VITALS — BP 150/100 | HR 94 | Ht 66.0 in | Wt 216.3 lb

## 2023-12-07 DIAGNOSIS — I5022 Chronic systolic (congestive) heart failure: Secondary | ICD-10-CM

## 2023-12-07 DIAGNOSIS — I480 Paroxysmal atrial fibrillation: Secondary | ICD-10-CM | POA: Diagnosis not present

## 2023-12-07 DIAGNOSIS — I483 Typical atrial flutter: Secondary | ICD-10-CM

## 2023-12-07 DIAGNOSIS — D6869 Other thrombophilia: Secondary | ICD-10-CM | POA: Diagnosis not present

## 2023-12-07 LAB — CUP PACEART INCLINIC DEVICE CHECK
Date Time Interrogation Session: 20251203102150
Implantable Lead Connection Status: 753985
Implantable Lead Connection Status: 753985
Implantable Lead Connection Status: 753985
Implantable Lead Implant Date: 20090720
Implantable Lead Implant Date: 20090720
Implantable Lead Implant Date: 20170627
Implantable Lead Location: 753858
Implantable Lead Location: 753859
Implantable Lead Location: 753860
Implantable Lead Model: 4598
Implantable Lead Model: 5076
Implantable Lead Model: 6947
Implantable Pulse Generator Implant Date: 20250123

## 2023-12-07 MED ORDER — METOPROLOL SUCCINATE ER 50 MG PO TB24: 50.0000 mg | ORAL_TABLET | Freq: Every day | ORAL | 6 refills | Status: AC

## 2023-12-07 MED ORDER — SPIRONOLACTONE 25 MG PO TABS: 25.0000 mg | ORAL_TABLET | Freq: Every day | ORAL | 1 refills | Status: AC

## 2023-12-07 MED ORDER — SACUBITRIL-VALSARTAN 24-26 MG PO TABS: 1.0000 | ORAL_TABLET | Freq: Two times a day (BID) | ORAL | 1 refills | Status: AC

## 2023-12-07 NOTE — Patient Instructions (Addendum)
 Medication Instructions:  Your physician has recommended you make the following change in your medication:  RESTART Entresto  24/26 mg twice daily RESTART Toprol  - increase to 50 mg daily RESTART Spironolactone  25 mg daily  *If you need a refill on your cardiac medications before your next appointment, please call your pharmacy*  Lab Work: None ordered  If you have any lab test that is abnormal or we need to change your treatment, we will call you to review the results.  Testing/Procedures: None ordered  Follow-Up: At [facility], you and your health needs are our priority.  As part of our continuing mission to provide you with exceptional heart care, our providers are all part of one team.  This team includes your primary Cardiologist (physician) and Advanced Practice Providers or APPs (Physician Assistants and Nurse Practitioners) who all work together to provide you with the care you need, when you need it.  Your next appointment:   6 month(s)  Provider:   You will see one of the following Advanced Practice Providers on your designated Care Team:   Charlies Arthur, PA-C Michael Andy Tillery, PA-C Suzann Riddle, NP Daphne Barrack, NP Artist Pouch, PA-C   Thank you for choosing [facility]!!   Maeola Domino, RN [phone]

## 2023-12-09 NOTE — Progress Notes (Deleted)
 Cardiology Office Note:  .   Date:  12/09/2023  ID:  Shelley Douglas, DOB 06-29-1950, MRN [id number] PCP: Sim Emery LITTIE, MD  [facility] Providers Cardiologist:  Maude Emmer, MD Electrophysiologist:  Will Gladis Norton, MD {  History of Present Illness: .   Shelley Douglas is a 73 y.o. female with history of ICM with reduced EF with CRT-D, LBBB, CAD with stenting to RCA, subsequent CTO of RCA, persistent atrial fibrillation/flutter, history of PE/DVT status post IVC filter, mitral valve disease status post angioplasty 1996, suspected OSA, CVA, hypertension, hyperlipidemia, type 2 diabetes, CKD, bilateral carpal tunnel status post bilateral CTR.     Cardiomyopathy History of chronic HFrEF since 2009 due to [facility] 06/2015 ICD upgrade to CRT-D 11/2021 EF 30 to 35%, stable MV annuloplasty with auto moderate MS, mean gradient 4. 12/2022 admitted for CHF, A-fib RVR in setting of dietary and medication noncompliance.  EF 20 to 25%, severely reduced RV.  Moderate to severe MR, moderate MR.  Moderate to severe TR.  Moderate mitral stenosis.  TEE with mildly reduced RV.  Severely dilated atria.  Mild MS, septal leaflet appears trapped by the device with severe TR.  Required AHF, and on milrinone .  He was in atrial flutter x 4 days per device interrogation.  DCCV 12/13.  Query of amyloidosis given bilateral carpal tunnel. 01/2023 generator change out  Atrial fibrillation Noted 12/2022 with CHF exacerbation/admission.  DCCV 12/17/2022. On amiodarone  with recurrences.  Potential discussion of ablation.  CAD 2009 BMS x 2 to proximal/mid RCA. 2016 LHC 70% ISR of P RCA stent, 80% ISR of mRCA stent, CTO distal RCA.  30% pLAD.  Treated medically, no viability on stress testing. 2018 inferior infarct but no ischemia  Social history      Patient with complex cardiac history with long standing history of ICM with reduced EF since 2009 with CRT-D.  She also has history of atrial  fibrillation with acute decompensated heart failure admission had DCCV 12/2022 and has been on amiodarone  with good result in rare recurrences.  She has been seen by EP/atrial fibrillation clinic over the past year but has yet to see general cardiology or advanced heart failure since that time.  Last seen 12/2023 and patient overall doing well, noted to have a low burden of atrial fibrillation on her device.  Reports that she had ran out of her medications.  Medications restarted.  ICM with reduced EF CRT-D -Longstanding history since 2009 -12/2022 EF 20 to 25%, severely reduced RV -generator change out 01/2023 Amyloid panel Repeat echocardiogram  CAD Hyperlipidemia -2009 BMS x 2 to proximal/mid RCA. -2016 LHC 70% ISR of P RCA stent, 80% ISR of mRCA stent, CTO distal RCA.  30% pLAD.  Treated medically, no viability on stress testing.  Persistent atrial fibrillation/flutter -12/2022 successful TEE/DCCV.  TEE demonstrated mild mitral stenosis.  Septal leaflet appears to have by the device lead with severe TR. Amiodarone  monitoring   Valvular disease -S/p MV angioplasty 1996  Suspected OSA Needs outpatient sleep study.  Hypertension  History of DVT with IVC filter  Type 2 diabetes  ROS: Denies: Chest pain, shortness of breath, orthopnea, peripheral edema, palpitations, decreased exercise intolerance, fatigue, lightheadedness.   Studies Reviewed: .         Risk Assessment/Calculations:   {Does this patient have ATRIAL FIBRILLATION?:[phone]} No BP recorded.  {Refresh Note OR Click here to enter BP  :1}***       Physical Exam:   VS:  There were no vitals taken for this visit.   Wt Readings from Last 3 Encounters:  12/07/23 216 lb 4.8 oz (98.1 kg)  11/07/23 212 lb 3.2 oz (96.3 kg)  09/08/23 217 lb (98.4 kg)    GEN: Well nourished, well developed in no acute distress NECK: No JVD; No carotid bruits CARDIAC: ***RRR, no murmurs, rubs, gallops RESPIRATORY:  Clear to  auscultation without rales, wheezing or rhonchi  ABDOMEN: Soft, non-tender, non-distended EXTREMITIES:  No edema; No deformity   ASSESSMENT AND PLAN: .         {Are you ordering a CV Procedure (e.g. stress test, cath, DCCV, TEE, etc)?   Press F2        :[id number]}  Dispo: ***  Signed, Thom LITTIE Sluder, PA-C

## 2023-12-14 ENCOUNTER — Ambulatory Visit: Attending: Cardiology | Admitting: Cardiology

## 2024-01-03 ENCOUNTER — Ambulatory Visit (INDEPENDENT_AMBULATORY_CARE_PROVIDER_SITE_OTHER): Payer: Medicare Other

## 2024-01-03 DIAGNOSIS — I480 Paroxysmal atrial fibrillation: Secondary | ICD-10-CM

## 2024-01-04 LAB — CUP PACEART REMOTE DEVICE CHECK
Battery Remaining Longevity: 99 mo
Battery Voltage: 2.99 V
Brady Statistic AP VP Percent: 1.73 %
Brady Statistic AP VS Percent: 0.02 %
Brady Statistic AS VP Percent: 93.54 %
Brady Statistic AS VS Percent: 4.72 %
Brady Statistic RA Percent Paced: 2.49 %
Brady Statistic RV Percent Paced: 95.26 %
Date Time Interrogation Session: 20251230084357
HighPow Impedance: 53 Ohm
HighPow Impedance: 74 Ohm
Implantable Lead Connection Status: 753985
Implantable Lead Connection Status: 753985
Implantable Lead Connection Status: 753985
Implantable Lead Implant Date: 20090720
Implantable Lead Implant Date: 20090720
Implantable Lead Implant Date: 20170627
Implantable Lead Location: 753858
Implantable Lead Location: 753859
Implantable Lead Location: 753860
Implantable Lead Model: 4598
Implantable Lead Model: 5076
Implantable Lead Model: 6947
Implantable Pulse Generator Implant Date: 20250123
Lead Channel Impedance Value: 1140 Ohm
Lead Channel Impedance Value: 1178 Ohm
Lead Channel Impedance Value: 285 Ohm
Lead Channel Impedance Value: 323 Ohm
Lead Channel Impedance Value: 380 Ohm
Lead Channel Impedance Value: 456 Ohm
Lead Channel Impedance Value: 494 Ohm
Lead Channel Impedance Value: 494 Ohm
Lead Channel Impedance Value: 627 Ohm
Lead Channel Impedance Value: 684 Ohm
Lead Channel Impedance Value: 722 Ohm
Lead Channel Impedance Value: 741 Ohm
Lead Channel Impedance Value: 988 Ohm
Lead Channel Pacing Threshold Amplitude: 0.375 V
Lead Channel Pacing Threshold Amplitude: 0.75 V
Lead Channel Pacing Threshold Amplitude: 1.5 V
Lead Channel Pacing Threshold Pulse Width: 0.4 ms
Lead Channel Pacing Threshold Pulse Width: 0.4 ms
Lead Channel Pacing Threshold Pulse Width: 0.4 ms
Lead Channel Sensing Intrinsic Amplitude: 5.3 mV
Lead Channel Sensing Intrinsic Amplitude: 8.4 mV
Lead Channel Setting Pacing Amplitude: 1.5 V
Lead Channel Setting Pacing Amplitude: 2 V
Lead Channel Setting Pacing Amplitude: 2.25 V
Lead Channel Setting Pacing Pulse Width: 0.4 ms
Lead Channel Setting Pacing Pulse Width: 0.4 ms
Lead Channel Setting Sensing Sensitivity: 0.3 mV
Zone Setting Status: 755011
Zone Setting Status: 755011

## 2024-01-04 NOTE — Progress Notes (Signed)
 Remote ICD Transmission

## 2024-01-06 ENCOUNTER — Ambulatory Visit: Payer: Self-pay | Admitting: Cardiology
